# Patient Record
Sex: Female | Born: 1960 | Race: Black or African American | Hispanic: No | Marital: Single | Smoking: Former smoker
Health system: Southern US, Community
[De-identification: ages and names within clinical notes are randomized; demographics above are authoritative.]

## PROBLEM LIST (undated history)

## (undated) DIAGNOSIS — D126 Benign neoplasm of colon, unspecified: Secondary | ICD-10-CM

## (undated) DIAGNOSIS — I10 Essential (primary) hypertension: Secondary | ICD-10-CM

## (undated) DIAGNOSIS — J449 Chronic obstructive pulmonary disease, unspecified: Secondary | ICD-10-CM

## (undated) DIAGNOSIS — E785 Hyperlipidemia, unspecified: Secondary | ICD-10-CM

## (undated) DIAGNOSIS — K922 Gastrointestinal hemorrhage, unspecified: Secondary | ICD-10-CM

## (undated) HISTORY — DX: Benign neoplasm of colon, unspecified: D12.6

## (undated) HISTORY — DX: Hyperlipidemia, unspecified: E78.5

---

## 1998-04-18 ENCOUNTER — Emergency Department (HOSPITAL_COMMUNITY): Admission: EM | Admit: 1998-04-18 | Discharge: 1998-04-19 | Payer: Self-pay | Admitting: Internal Medicine

## 1998-12-18 ENCOUNTER — Encounter: Payer: Self-pay | Admitting: Emergency Medicine

## 1998-12-18 ENCOUNTER — Emergency Department (HOSPITAL_COMMUNITY): Admission: EM | Admit: 1998-12-18 | Discharge: 1998-12-18 | Payer: Self-pay | Admitting: Emergency Medicine

## 1999-08-19 ENCOUNTER — Emergency Department (HOSPITAL_COMMUNITY): Admission: EM | Admit: 1999-08-19 | Discharge: 1999-08-19 | Payer: Self-pay | Admitting: Emergency Medicine

## 2000-01-02 ENCOUNTER — Encounter: Payer: Self-pay | Admitting: Internal Medicine

## 2000-01-02 ENCOUNTER — Ambulatory Visit (HOSPITAL_COMMUNITY): Admission: RE | Admit: 2000-01-02 | Discharge: 2000-01-02 | Payer: Self-pay | Admitting: Internal Medicine

## 2000-12-07 ENCOUNTER — Emergency Department (HOSPITAL_COMMUNITY): Admission: EM | Admit: 2000-12-07 | Discharge: 2000-12-07 | Payer: Self-pay | Admitting: *Deleted

## 2000-12-07 ENCOUNTER — Encounter: Payer: Self-pay | Admitting: Emergency Medicine

## 2001-01-22 ENCOUNTER — Ambulatory Visit (HOSPITAL_COMMUNITY): Admission: RE | Admit: 2001-01-22 | Discharge: 2001-01-22 | Payer: Self-pay | Admitting: Family Medicine

## 2001-07-17 ENCOUNTER — Encounter: Payer: Self-pay | Admitting: Emergency Medicine

## 2001-07-17 ENCOUNTER — Emergency Department (HOSPITAL_COMMUNITY): Admission: EM | Admit: 2001-07-17 | Discharge: 2001-07-17 | Payer: Self-pay | Admitting: Emergency Medicine

## 2002-06-12 ENCOUNTER — Inpatient Hospital Stay (HOSPITAL_COMMUNITY): Admission: EM | Admit: 2002-06-12 | Discharge: 2002-06-15 | Payer: Self-pay | Admitting: Psychiatry

## 2002-08-07 ENCOUNTER — Other Ambulatory Visit: Admission: RE | Admit: 2002-08-07 | Discharge: 2002-08-07 | Payer: Self-pay | Admitting: Family Medicine

## 2002-08-21 ENCOUNTER — Emergency Department (HOSPITAL_COMMUNITY): Admission: EM | Admit: 2002-08-21 | Discharge: 2002-08-21 | Payer: Self-pay | Admitting: Emergency Medicine

## 2002-08-23 ENCOUNTER — Emergency Department (HOSPITAL_COMMUNITY): Admission: EM | Admit: 2002-08-23 | Discharge: 2002-08-23 | Payer: Self-pay | Admitting: Emergency Medicine

## 2003-05-20 ENCOUNTER — Encounter: Payer: Self-pay | Admitting: *Deleted

## 2003-05-20 ENCOUNTER — Emergency Department (HOSPITAL_COMMUNITY): Admission: EM | Admit: 2003-05-20 | Discharge: 2003-05-20 | Payer: Self-pay | Admitting: *Deleted

## 2006-09-12 ENCOUNTER — Emergency Department (HOSPITAL_COMMUNITY): Admission: EM | Admit: 2006-09-12 | Discharge: 2006-09-12 | Payer: Self-pay | Admitting: Emergency Medicine

## 2006-11-19 ENCOUNTER — Emergency Department (HOSPITAL_COMMUNITY): Admission: EM | Admit: 2006-11-19 | Discharge: 2006-11-19 | Payer: Self-pay | Admitting: Emergency Medicine

## 2006-12-10 ENCOUNTER — Ambulatory Visit: Payer: Self-pay | Admitting: Family Medicine

## 2006-12-12 ENCOUNTER — Ambulatory Visit: Payer: Self-pay | Admitting: *Deleted

## 2007-01-10 ENCOUNTER — Ambulatory Visit: Payer: Self-pay | Admitting: Family Medicine

## 2007-01-17 ENCOUNTER — Ambulatory Visit (HOSPITAL_COMMUNITY): Admission: RE | Admit: 2007-01-17 | Discharge: 2007-01-17 | Payer: Self-pay | Admitting: Family Medicine

## 2007-02-11 ENCOUNTER — Ambulatory Visit: Payer: Self-pay | Admitting: Family Medicine

## 2007-03-11 ENCOUNTER — Encounter (INDEPENDENT_AMBULATORY_CARE_PROVIDER_SITE_OTHER): Payer: Self-pay | Admitting: Family Medicine

## 2007-03-11 ENCOUNTER — Ambulatory Visit: Payer: Self-pay | Admitting: Family Medicine

## 2007-04-15 ENCOUNTER — Ambulatory Visit: Payer: Self-pay | Admitting: Family Medicine

## 2007-07-16 ENCOUNTER — Encounter (INDEPENDENT_AMBULATORY_CARE_PROVIDER_SITE_OTHER): Payer: Self-pay | Admitting: *Deleted

## 2007-11-02 ENCOUNTER — Emergency Department (HOSPITAL_COMMUNITY): Admission: EM | Admit: 2007-11-02 | Discharge: 2007-11-02 | Payer: Self-pay | Admitting: Emergency Medicine

## 2008-02-27 ENCOUNTER — Ambulatory Visit: Payer: Self-pay | Admitting: Family Medicine

## 2008-03-08 ENCOUNTER — Emergency Department (HOSPITAL_COMMUNITY): Admission: EM | Admit: 2008-03-08 | Discharge: 2008-03-08 | Payer: Self-pay | Admitting: Emergency Medicine

## 2008-03-09 ENCOUNTER — Ambulatory Visit (HOSPITAL_COMMUNITY): Admission: RE | Admit: 2008-03-09 | Discharge: 2008-03-09 | Payer: Self-pay | Admitting: Family Medicine

## 2008-07-28 ENCOUNTER — Ambulatory Visit: Payer: Self-pay | Admitting: Internal Medicine

## 2008-12-12 ENCOUNTER — Emergency Department (HOSPITAL_COMMUNITY): Admission: EM | Admit: 2008-12-12 | Discharge: 2008-12-12 | Payer: Self-pay | Admitting: Emergency Medicine

## 2008-12-17 ENCOUNTER — Ambulatory Visit: Payer: Self-pay | Admitting: Family Medicine

## 2008-12-23 ENCOUNTER — Ambulatory Visit: Payer: Self-pay | Admitting: Family Medicine

## 2009-02-23 ENCOUNTER — Ambulatory Visit: Payer: Self-pay | Admitting: Family Medicine

## 2009-02-24 ENCOUNTER — Ambulatory Visit (HOSPITAL_COMMUNITY): Admission: RE | Admit: 2009-02-24 | Discharge: 2009-02-24 | Payer: Self-pay | Admitting: Family Medicine

## 2009-03-08 ENCOUNTER — Ambulatory Visit (HOSPITAL_COMMUNITY): Admission: RE | Admit: 2009-03-08 | Discharge: 2009-03-08 | Payer: Self-pay | Admitting: Family Medicine

## 2009-03-25 ENCOUNTER — Ambulatory Visit: Payer: Self-pay | Admitting: Family Medicine

## 2009-11-22 ENCOUNTER — Ambulatory Visit: Payer: Self-pay | Admitting: Family Medicine

## 2009-12-09 ENCOUNTER — Telehealth (INDEPENDENT_AMBULATORY_CARE_PROVIDER_SITE_OTHER): Payer: Self-pay | Admitting: *Deleted

## 2009-12-09 ENCOUNTER — Ambulatory Visit (HOSPITAL_COMMUNITY): Admission: RE | Admit: 2009-12-09 | Discharge: 2009-12-09 | Payer: Self-pay | Admitting: Family Medicine

## 2010-03-10 ENCOUNTER — Ambulatory Visit (HOSPITAL_COMMUNITY): Admission: RE | Admit: 2010-03-10 | Discharge: 2010-03-10 | Payer: Self-pay | Admitting: Internal Medicine

## 2010-09-10 ENCOUNTER — Emergency Department (HOSPITAL_COMMUNITY)
Admission: EM | Admit: 2010-09-10 | Discharge: 2010-09-10 | Payer: Self-pay | Source: Home / Self Care | Admitting: Emergency Medicine

## 2010-09-12 ENCOUNTER — Emergency Department (HOSPITAL_COMMUNITY): Admission: EM | Admit: 2010-09-12 | Discharge: 2010-09-12 | Payer: Self-pay | Admitting: Emergency Medicine

## 2010-10-05 ENCOUNTER — Emergency Department (HOSPITAL_COMMUNITY): Admission: EM | Admit: 2010-10-05 | Discharge: 2009-11-12 | Payer: Self-pay | Admitting: Emergency Medicine

## 2010-11-28 NOTE — Progress Notes (Signed)
Summary: triage/injury finger  Phone Note Call from Patient   Caller: Patient Reason for Call: Talk to Nurse Summary of Call: Patient came into front desk wanting to be seen.Marland KitchenMarland KitchenShe pulled skin off her finger and a big chuck of skin about the size of a dime came off and several layers beneath. No bleeding or bone seen.Marland KitchenMarland KitchenShe has area coverd with bandaid and states it is throbbing.Marland KitchenMarland KitchenPatient waiting for wallk in eligilibity. I will put her in acute schedule pending approval..Marland KitchenOtherwise she will be directed to UC or ED. Initial call taken by: Conchita Paris,  December 09, 2009 11:43 AM

## 2011-02-13 LAB — RPR: RPR Ser Ql: NONREACTIVE

## 2011-02-13 LAB — ROCKY MTN SPOTTED FVR AB, IGM-BLOOD: RMSF IgM: 0.46 IV (ref 0.00–0.89)

## 2011-02-14 ENCOUNTER — Other Ambulatory Visit (HOSPITAL_COMMUNITY): Payer: Self-pay | Admitting: Family Medicine

## 2011-02-14 DIAGNOSIS — Z1231 Encounter for screening mammogram for malignant neoplasm of breast: Secondary | ICD-10-CM

## 2011-03-16 ENCOUNTER — Ambulatory Visit (HOSPITAL_COMMUNITY)
Admission: RE | Admit: 2011-03-16 | Discharge: 2011-03-16 | Disposition: A | Discharge status: Home / Self Care | Payer: Self-pay | Source: Ambulatory Visit | Attending: Family Medicine | Admitting: Family Medicine

## 2011-03-16 DIAGNOSIS — Z1231 Encounter for screening mammogram for malignant neoplasm of breast: Secondary | ICD-10-CM | POA: Insufficient documentation

## 2011-03-16 NOTE — H&P (Signed)
NAME:  Mariah Mariah Foster, Mariah Mariah Foster                         ACCOUNT NO.:  [id number]   MEDICAL RECORD NO.:  [id number]                   PATIENT TYPE:  IPS   LOCATION:  0504                                 [facility]ILITY[facility]   PHYSICIAN:  Geoffery Lyons, MD                     DATE OF BIRTH:  1961-09-19   DATE OF ADMISSION:  06/12/2002  DATE OF DISCHARGE:  06/15/2002                         PSYCHIATRIC ADMISSION ASSESSMENT   IDENTIFYING INFORMATION:  The patient is Mariah Foster 50 year old separated African  American female voluntarily admitted on June 12, 2002.   HISTORY OF PRESENT ILLNESS:  The patient presents with Mariah Foster history of  intentional overdose of five Celebrex tablets and an extra Verapamil tablet  on Friday at home after drinking two 40 ounce beers at home.  The patient  states she got upset at her husband who was having an affair with another  woman in the neighborhood.  Her neighbor knew that the patient had taken the  pills.  Her intention was to see if her husband would come back to her.  Her  sleep has been decreased, her appetite has been decreased.  She denies any  psychotic symptoms.  She reports that she states it was Mariah Foster stupid thing to  do.  She reports no withdrawal symptoms.   PAST PSYCHIATRIC HISTORY:  First hospitalization to [facility], no history of detoxification, no history of Mariah Foster suicide attempt.   SOCIAL HISTORY:  This is Mariah Foster 50 year old separated African American female.  First marriage, married for 27 years.  She has two children, ages 51 and 76.  She lives with her children.  She is unemployed.  Completed the 12th grade.  No legal charges.   FAMILY HISTORY:  None.   SUBSTANCE ABUSE HISTORY:  The patient smokes.  She has been drinking two 40  ounce beers for the past 20 years, but does not drink in the morning.  She  states she usually drinks on the weekend with no history of blackouts or  seizures.  She usually drinks alone.  She also smoked cocaine, states only  once.   PAST MEDICAL HISTORY:  Primary care Vivan Agostino: The patient attends [facility].  Medical problems: Hypertension.   MEDICATIONS:  Verapamil 180 mg every day.   DRUG ALLERGIES:  No known allergies.   PHYSICAL EXAMINATION:  Performed at [facility] Emergency Department.   LABORATORY DATA:  CBC: Within normal limits.  Urinalysis was negative.  CMET: Potassium 3.1.  Alcohol level was 313.  Urine drug screen was positive  for cocaine.   MENTAL STATUS EXAM:  She is an alert, disheveled African American female.  She is thin, she is cooperative with good eye contact.  Speech is clear and  polite.  Mood is depressed.  Affect showed some depression with periods of  being very bright.  Thought processes: The patient appears to be minimizing  her  drinking.  She is coherent, however, with no evidence of psychosis, no  auditory or visual hallucinations, no suicidal or homicidal ideation.  Cognitive: Intact.  Memory is fair.  Judgment and insight are fair.  Poor  impulse control.   ADMISSION DIAGNOSES:   AXIS I:  1. Depression, not otherwise specified.  2. Suicide gesture.  3. Alcohol abuse, rule out dependence.   AXIS II:  Deferred.   AXIS III:  Hypertension.   AXIS IV:  Problems with primary support group and other psychosocial  problems.   AXIS V:  Current is 30, this past year is 60.   INITIAL PLAN OF CARE:  Plan is Mariah Foster voluntary admission for depression,  intentional overdose, and alcohol abuse.  Contract for safety, check every  15 minutes.  Will put the patient on Mariah Foster low dose Librium protocol to detoxify  safely.  Will initiate Lexapro to decrease depressive symptoms, to stabilize  mood and thinking so the patient can be safe.  Medication compliance was  discussed.  Will encourage fluids.  The patient is to attend AA and mental  health after discharge and to remain alcohol and drug free.   ESTIMATED LENGTH OF STAY:  Three to four days.     Landry Corporal, N.P.                        Geoffery Lyons, MD    JO/MEDQ  D:  07/13/2002  T:  07/13/2002  Job:  [id number]

## 2011-03-16 NOTE — Discharge Summary (Signed)
NAME:  Mariah Foster, Mariah Foster                         ACCOUNT NO.:  [id number]   MEDICAL RECORD NO.:  [id number]                  PATIENT TYPE:  IPS   LOCATION:  0504                                 [facility]   PHYSICIAN:  Geoffery Lyons, MD                     DATE OF BIRTH:  03-Jun-1961   DATE OF ADMISSION:  06/12/2002  DATE OF DISCHARGE:  06/15/2002                                 DISCHARGE SUMMARY   CHIEF COMPLAINT AND PRESENT ILLNESS:  This was the first admission to [facility] for this 50 year old female voluntarily  admitted after an intentional overdose of five Celebrex and extra verapamil,  drinking two 40 ounce beers at home.  She got upset over her husband having  an affair with another woman in the neighborhood.  Her neighbor knew that  she had taken the pills.  The patient wanted to see if the husband would  come back to her.  She has had increased depression, decreased sleep,  decreased appetite, but there was no evidence of psychosis.  She had  expressed upon admission regrets of what she did.   PAST PSYCHIATRIC HISTORY:  First time inpatient, no previous detoxification,  no previous suicidal attempt.   ALCOHOL HISTORY:  Two 40 ounce beers on the weekend for 20 years, no weekday  consumption, no blackouts, no seizures, no withdrawal, no drugs.   PAST MEDICAL HISTORY:  Arterial hypertension.   MEDICATIONS:  Verapamil 180 mg per day.   PHYSICAL EXAMINATION:  Physical examination was performed, failed to show  any acute findings.   MENTAL STATUS EXAM:  Mental status exam revealed an alert, disheveled,  African-American female, thin, cooperative,  good eye contact.  Speech:  Clear, goal directed.  Mood: Depressed.  Affect: Depressed.  Thought  processes: Coherent, logical; no evidence of psychosis, no auditory and  visual hallucinations, no suicidal or homicidal ideas.  Cognitive: Cognition  was well preserved.   ADMISSION DIAGNOSES:   AXIS  I:  1. Depressive disorder, not otherwise specified.  2. Alcohol abuse.   AXIS II:  No diagnosis.   AXIS III:  Arterial hypertension.   AXIS IV:  Moderate.   AXIS V:  Global assessment of functioning upon admission 30, highest global  assessment of functioning in the last year 60.   LABORATORY DATA:  Other laboratory workup: Thyroid profile was within normal  limits.   HOSPITAL COURSE:  She was admitted and started in intensive individual and  group psychotherapy.  She was detoxified using Librium.  She was placed back  on her Verapamil 180 mg daily.  Due to her depression, she was placed on  Lexapro 5 mg every day and trazodone for sleep.  She was able to open up in  group and individually, did admit to multiple substance use, cocaine,  alcohol.  There were environmental issues, dealing with the affair  of the  husband, possible acting out to get the husband back.  She did evidence  being somewhat reserved, somewhat guarded, some psychomotor retardation but  there was no evidence of psychosis and she persistently denied any suicidal  ideation.  We continued to work with her.  On August 18, she was much  improved, mood euthymic, affect brighter and broad, no suicidal or homicidal  ideas.  She was pretty much ready to move on with her life, did admit that  this was an attempt to get the husband back and it did not work so she was  ready to go and let him go.  She denied any suicidal ideas, no homicidal  ideas.  She was willing to go back to [facility] for further treatment.   DISCHARGE DIAGNOSES:   AXIS I:  1. Depressive disorder, not otherwise specified.  2. Polysubstance abuse.   AXIS II:  No diagnosis.   AXIS III:  Arterial hypertension.   AXIS IV:  Moderate.   AXIS V:  Global assessment of functioning upon discharge 55.   DISCHARGE MEDICATIONS:  1. Verapamil 180 mg daily.  2. Lexapro 10 mg daily.  3. Trazodone as needed for sleep.   FOLLOW UP:  The patient  was to follow-up [facility].                                                 Geoffery Lyons, MD    IL/MEDQ  D:  07/13/2002  T:  07/13/2002  Job:  [id number]

## 2011-04-25 ENCOUNTER — Emergency Department (HOSPITAL_COMMUNITY): Payer: Self-pay

## 2011-04-25 ENCOUNTER — Emergency Department (HOSPITAL_COMMUNITY)
Admission: EM | Admit: 2011-04-25 | Discharge: 2011-04-25 | Disposition: A | Discharge status: Home / Self Care | Payer: Self-pay | Attending: Emergency Medicine | Admitting: Emergency Medicine

## 2011-04-25 DIAGNOSIS — R05 Cough: Secondary | ICD-10-CM | POA: Insufficient documentation

## 2011-04-25 DIAGNOSIS — J40 Bronchitis, not specified as acute or chronic: Secondary | ICD-10-CM | POA: Insufficient documentation

## 2011-04-25 DIAGNOSIS — I10 Essential (primary) hypertension: Secondary | ICD-10-CM | POA: Insufficient documentation

## 2011-04-25 DIAGNOSIS — R070 Pain in throat: Secondary | ICD-10-CM | POA: Insufficient documentation

## 2011-04-25 DIAGNOSIS — Z79899 Other long term (current) drug therapy: Secondary | ICD-10-CM | POA: Insufficient documentation

## 2011-04-25 DIAGNOSIS — R059 Cough, unspecified: Secondary | ICD-10-CM | POA: Insufficient documentation

## 2011-04-25 DIAGNOSIS — E785 Hyperlipidemia, unspecified: Secondary | ICD-10-CM | POA: Insufficient documentation

## 2011-04-25 DIAGNOSIS — R1310 Dysphagia, unspecified: Secondary | ICD-10-CM | POA: Insufficient documentation

## 2011-04-25 DIAGNOSIS — R509 Fever, unspecified: Secondary | ICD-10-CM | POA: Insufficient documentation

## 2011-04-25 DIAGNOSIS — F329 Major depressive disorder, single episode, unspecified: Secondary | ICD-10-CM | POA: Insufficient documentation

## 2011-04-25 DIAGNOSIS — F3289 Other specified depressive episodes: Secondary | ICD-10-CM | POA: Insufficient documentation

## 2011-04-25 DIAGNOSIS — R079 Chest pain, unspecified: Secondary | ICD-10-CM | POA: Insufficient documentation

## 2011-04-25 DIAGNOSIS — R110 Nausea: Secondary | ICD-10-CM | POA: Insufficient documentation

## 2011-04-25 LAB — URINALYSIS, ROUTINE W REFLEX MICROSCOPIC
Nitrite: NEGATIVE
Specific Gravity, Urine: 1.012 (ref 1.005–1.030)
Urobilinogen, UA: 0.2 mg/dL (ref 0.0–1.0)

## 2011-04-25 LAB — URINE MICROSCOPIC-ADD ON

## 2011-09-28 ENCOUNTER — Encounter: Payer: Self-pay | Admitting: Emergency Medicine

## 2011-09-28 ENCOUNTER — Emergency Department (INDEPENDENT_AMBULATORY_CARE_PROVIDER_SITE_OTHER)
Admission: EM | Admit: 2011-09-28 | Discharge: 2011-09-28 | Disposition: A | Discharge status: Home / Self Care | Payer: Self-pay | Source: Home / Self Care | Attending: Emergency Medicine | Admitting: Emergency Medicine

## 2011-09-28 DIAGNOSIS — S61309A Unspecified open wound of unspecified finger with damage to nail, initial encounter: Secondary | ICD-10-CM

## 2011-09-28 DIAGNOSIS — S61209A Unspecified open wound of unspecified finger without damage to nail, initial encounter: Secondary | ICD-10-CM

## 2011-09-28 HISTORY — DX: Essential (primary) hypertension: I10

## 2011-09-28 NOTE — ED Notes (Signed)
Removal of hangnail. Pt states it has been injured for about a month, but is spreading and becoming more painful when catching on things. No discharge, or draining.

## 2011-09-28 NOTE — ED Provider Notes (Signed)
History     CSN: [id number] Arrival date & time: 09/28/2011  8:35 PM   First MD Initiated Contact with Patient 09/28/11 1921      Chief Complaint  Patient presents with  . Nail Problem    (Consider location/radiation/quality/duration/timing/severity/associated sxs/prior treatment) HPI Comments: He has a partially avulsed fingernail of the right middle finger. This apparently happened weeks ago, but she can't recall any trauma. The new nail is growing in underneath-year-old female in the old nail is somewhat loose and painful to touch. There is no evidence of infection.   Past Medical History  Diagnosis Date  . Hypertension     History reviewed. No pertinent past surgical history.  History reviewed. No pertinent family history.  History  Substance Use Topics  . Smoking status: Former Smoker    Quit date: 06/28/2011  . Smokeless tobacco: Not on file  . Alcohol Use: No    OB History    Grav Para Term Preterm Abortions TAB SAB Ect Mult Living                  Review of Systems  Musculoskeletal: Positive for arthralgias. Negative for myalgias, back pain, joint swelling and gait problem.  Skin: Negative for rash and wound.  Neurological: Negative for weakness and numbness.    Allergies  Review of patient's allergies indicates no known allergies.  Home Medications  No current outpatient prescriptions on file.  BP 132/85  Pulse 69  Temp(Src) 97.9 F (36.6 C) (Oral)  SpO2 98%  Physical Exam  Nursing note and vitals reviewed. Constitutional: She is oriented to person, place, and time. She appears well-developed and well-nourished. No distress.  Musculoskeletal: Normal range of motion. She exhibits no edema and no tenderness.       Exam the right middle finger reveals a partially avulsed nail that was partially attached to the nailbed. The new nail is growing in underneath this and appears to be healthy. There is no evidence of infection.  Neurological: She is alert  and oriented to person, place, and time. She has normal strength and normal reflexes. She displays no atrophy. No sensory deficit. She exhibits normal muscle tone.  Skin: Skin is warm and dry. No rash noted. She is not diaphoretic.    ED Course  NAIL REMOVAL Date/Time: 09/28/2011 11:04 PM Performed by: Roque Lias Authorized by: Roque Lias Consent: Verbal consent obtained. Written consent not obtained. Risks and benefits: risks, benefits and alternatives were discussed Consent given by: patient Patient understanding: patient states understanding of the procedure being performed Location: right hand Location details: right long finger Anesthesia: digital block Local anesthetic: lidocaine 2% without epinephrine Anesthetic total: 5 ml Patient sedated: no Preparation: skin prepped with alcohol Amount removed: complete Wedge excision of skin of nail fold: no Nail bed sutured: no Nail matrix removed: none Dressing: antibiotic ointment and gauze roll Patient tolerance: Patient tolerated the procedure well with no immediate complications.   (including critical care time)  Labs Reviewed - No data to display No results found.   1. Nail avulsion, finger       MDM  The patient had a partially evulsed nail. After adequate anesthesia the remainder the nail was removed and a dressing was applied. She was instructed in wound care.        Roque Lias, MD 09/28/11 [phone]

## 2011-10-10 ENCOUNTER — Other Ambulatory Visit (HOSPITAL_COMMUNITY): Payer: Self-pay | Admitting: Family Medicine

## 2011-10-10 DIAGNOSIS — E0590 Thyrotoxicosis, unspecified without thyrotoxic crisis or storm: Secondary | ICD-10-CM

## 2011-10-18 ENCOUNTER — Encounter (HOSPITAL_COMMUNITY)
Admission: RE | Admit: 2011-10-18 | Discharge: 2011-10-18 | Disposition: A | Discharge status: Home / Self Care | Payer: Self-pay | Source: Ambulatory Visit | Attending: Family Medicine | Admitting: Family Medicine

## 2011-10-18 DIAGNOSIS — E039 Hypothyroidism, unspecified: Secondary | ICD-10-CM | POA: Insufficient documentation

## 2011-10-18 DIAGNOSIS — E0590 Thyrotoxicosis, unspecified without thyrotoxic crisis or storm: Secondary | ICD-10-CM

## 2011-10-18 MED ORDER — SODIUM IODIDE I 131 CAPSULE
6.8000 | Freq: Once | INTRAVENOUS | Status: AC | PRN
  Administered 2011-10-18: 6.8 via ORAL

## 2011-10-19 ENCOUNTER — Encounter (HOSPITAL_COMMUNITY)
Admission: RE | Admit: 2011-10-19 | Discharge: 2011-10-19 | Disposition: A | Discharge status: Home / Self Care | Payer: Self-pay | Source: Ambulatory Visit | Attending: Family Medicine | Admitting: Family Medicine

## 2011-10-19 MED ORDER — SODIUM IODIDE I 131 CAPSULE
6.8000 | Freq: Once | INTRAVENOUS | Status: AC | PRN
  Administered 2011-10-18: 6.8 via ORAL

## 2011-10-19 MED ORDER — SODIUM PERTECHNETATE TC 99M INJECTION
10.5000 | Freq: Once | INTRAVENOUS | Status: AC | PRN
  Administered 2011-10-19: 11 via INTRAVENOUS

## 2011-10-30 DIAGNOSIS — D126 Benign neoplasm of colon, unspecified: Secondary | ICD-10-CM

## 2011-10-30 HISTORY — DX: Benign neoplasm of colon, unspecified: D12.6

## 2011-11-22 ENCOUNTER — Other Ambulatory Visit (HOSPITAL_COMMUNITY): Payer: Self-pay | Admitting: Family Medicine

## 2011-11-22 ENCOUNTER — Other Ambulatory Visit: Payer: Self-pay | Admitting: Family Medicine

## 2011-11-22 DIAGNOSIS — Z1231 Encounter for screening mammogram for malignant neoplasm of breast: Secondary | ICD-10-CM

## 2012-01-03 ENCOUNTER — Telehealth: Payer: Self-pay | Admitting: *Deleted

## 2012-01-03 NOTE — Telephone Encounter (Signed)
Pt. Forgot her appt.  Thought it was on a Friday.  I [facility] her to [facility]  01/04/12  Previsit room 51

## 2012-01-04 ENCOUNTER — Telehealth: Payer: Self-pay | Admitting: Gastroenterology

## 2012-01-04 ENCOUNTER — Ambulatory Visit (AMBULATORY_SURGERY_CENTER): Payer: Self-pay | Admitting: *Deleted

## 2012-01-04 VITALS — Ht 64.0 in | Wt 120.0 lb

## 2012-01-04 DIAGNOSIS — Z1211 Encounter for screening for malignant neoplasm of colon: Secondary | ICD-10-CM

## 2012-01-04 MED ORDER — PEG-KCL-NACL-NASULF-NA ASC-C 100 G PO SOLR: ORAL | Status: DC

## 2012-01-04 NOTE — Telephone Encounter (Signed)
I spoke with Aundra Millet re: Ms. Leger confusion about what procedure she is scheduled for.  Ms. Maple Hudson explained that Ms. Davlin has problems comprehending instructions and thought she was having Thyroid Surgery.  Ms. Maple Hudson said she would explain to pt what she is having.   Note:  Ms. Rahn couldn't come to the phone at this time.

## 2012-01-17 ENCOUNTER — Encounter: Payer: Self-pay | Admitting: Gastroenterology

## 2012-01-17 ENCOUNTER — Other Ambulatory Visit: Payer: Self-pay | Admitting: Gastroenterology

## 2012-01-17 ENCOUNTER — Ambulatory Visit (AMBULATORY_SURGERY_CENTER): Payer: Self-pay | Admitting: Gastroenterology

## 2012-01-17 VITALS — BP 139/90 | HR 70 | Temp 99.2°F | Resp 18 | Ht 64.0 in | Wt 120.0 lb

## 2012-01-17 DIAGNOSIS — D126 Benign neoplasm of colon, unspecified: Secondary | ICD-10-CM

## 2012-01-17 DIAGNOSIS — Z1211 Encounter for screening for malignant neoplasm of colon: Secondary | ICD-10-CM

## 2012-01-17 LAB — GLUCOSE, CAPILLARY: Glucose-Capillary: 117 mg/dL — ABNORMAL HIGH (ref 70–99)

## 2012-01-17 MED ORDER — SODIUM CHLORIDE 0.9 % IV SOLN: 500.0000 mL | INTRAVENOUS | Status: DC

## 2012-01-17 NOTE — Progress Notes (Signed)
Pressure applied to abdomen to reach cecum.  

## 2012-01-17 NOTE — Op Note (Addendum)
[facility] [address] [place], [place]  [address]  COLONOSCOPY PROCEDURE REPORT PATIENT:  Mariah Foster, Mariah Foster  MR#:  [id number] BIRTHDATE:  Jul 07, 1961, 50 yrs. old  GENDER:  female ENDOSCOPIST:  Judie Petit T. Russella Dar, MD, [facility] Referred by:  Jaclyn Shaggy, M.D. PROCEDURE DATE:  01/17/2012 PROCEDURE:  Colonoscopy with biopsy and snare polypectomy ASA CLASS:  Class II INDICATIONS:  1) Routine Risk Screening MEDICATIONS:   Fentanyl 100 mcg IV, Versed 9 mg IV DESCRIPTION OF PROCEDURE:   After the risks benefits and alternatives of the procedure were thoroughly explained, informed consent was obtained.  Digital rectal exam was performed and revealed no abnormalities.   The LB [id number]-[id number] endoscope was introduced through the anus and advanced to the cecum, which was identified by both the appendix and ileocecal valve, without limitations.  The quality of the prep was good, using MoviPrep. The instrument was then slowly withdrawn as the colon was fully examined. <<PROCEDUREIMAGES>> FINDINGS:  A sessile polyp was found at the hepatic flexure. It was 4 mm in size. The polyp was removed using cold biopsy forceps. A sessile polyp was found in the mid transverse colon. It was 6 mm in size. Polyp was snared without cautery. Retrieval was successful.  A sessile polyp was found in the sigmoid colon. It was 3 mm in size. The polyp was removed using cold biopsy forceps. Three polyps were found in the rectum. They were 4 - 5 mm in size. Polyps were snared without cautery. Retrieval was successful. Otherwise normal colonoscopy without other polyps, masses, vascular ectasias, or inflammatory changes. Retroflexed views in the rectum revealed internal hemorrhoids, small. The time to cecum =  2.67 minutes. The scope was then withdrawn (time =  13.25  min) from the patient and the procedure completed.  COMPLICATIONS:  None  ENDOSCOPIC IMPRESSION: 1) 4 mm sessile polyp at the hepatic  flexure 2) 6 mm sessile polyp in the mid transverse colon 3) 3 mm sessile polyp in the sigmoid colon 4) 4 - 5 mm Three polyps in the rectum 5) Internal hemorrhoids  RECOMMENDATIONS: 1) Await pathology results 2) If the polyps are adenomatous (pre-cancerous), repeat colonoscopy in 5 years. Otherwise follow colorectal cancer screening guidelines for "routine risk" patients with colonoscopy in 10 years.  Venita Lick. Russella Dar, MD, [facility]  n. REVISED:  01/17/2012 10:08 AM eSIGNED:   Venita Lick. Kyndall Chaplin at 01/17/2012 10:08 AM  [address], [place], [id number]

## 2012-01-17 NOTE — Patient Instructions (Signed)
Colonoscopy Care After Read the instructions outlined below and refer to this sheet in the next few weeks. These discharge instructions provide you with general information on caring for yourself after you leave the hospital. Your doctor may also give you specific instructions. While your treatment has been planned according to the most current medical practices available, unavoidable complications occasionally occur. If you have any problems or questions after discharge, call your doctor. HOME CARE INSTRUCTIONS ACTIVITY:  You may resume your regular activity, but move at a slower pace for the next 24 hours.   Take frequent rest periods for the next 24 hours.   Walking will help get rid of the air and reduce the bloated feeling in your belly (abdomen).   No driving for 24 hours (because of the medicine (anesthesia) used during the test).   You may shower.   Do not sign any important legal documents or operate any machinery for 24 hours (because of the anesthesia used during the test).  NUTRITION:  Drink plenty of fluids.   You may resume your normal diet as instructed by your doctor.   Begin with a light meal and progress to your normal diet. Heavy or fried foods are harder to digest and may make you feel sick to your stomach (nauseated).   Avoid alcoholic beverages for 24 hours or as instructed.  MEDICATIONS:  You may resume your normal medications unless your doctor tells you otherwise.  WHAT TO EXPECT TODAY:  Some feelings of bloating in the abdomen.   Passage of more gas than usual.   Spotting of blood in your stool or on the toilet paper.  IF YOU HAD POLYPS REMOVED DURING THE COLONOSCOPY:  No aspirin products for 7 days or as instructed.   No alcohol for 7 days or as instructed.   Eat a soft diet for the next 24 hours.  FINDING OUT THE RESULTS OF YOUR TEST Not all test results are available during your visit. If your test results are not back during the visit, make an  appointment with your caregiver to find out the results. Do not assume everything is normal if you have not heard from your caregiver or the medical facility. It is important for you to follow up on all of your test results.  SEEK IMMEDIATE MEDICAL CARE IF:  You have more than a spotting of blood in your stool.   Your belly is swollen (abdominal distention).   You are nauseated or vomiting.   You have a fever.   You have abdominal pain or discomfort that is severe or gets worse throughout the day.     Colon Polyps A polyp is extra tissue that grows inside your body. Colon polyps grow in the large intestine. The large intestine, also called the colon, is part of your digestive system. It is a long, hollow tube at the end of your digestive tract where your body makes and stores stool. Most polyps are not dangerous. They are benign. This means they are not cancerous. But over time, some types of polyps can turn into cancer. Polyps that are smaller than a pea are usually not harmful. But larger polyps could someday become or may already be cancerous. To be safe, doctors remove all polyps and test them.  WHO GETS POLYPS? Anyone can get polyps, but certain people are more likely than others. You may have a greater chance of getting polyps if:  You are over 50.   You have had polyps before.     Someone in your family has had polyps.   Someone in your family has had cancer of the large intestine.   Find out if someone in your family has had polyps. You may also be more likely to get polyps if you:   Eat a lot of fatty foods.   Smoke.   Drink alcohol.   Do not exercise.   Eat too much.  SYMPTOMS  Most small polyps do not cause symptoms. People often do not know they have one until their caregiver finds it during a regular checkup or while testing them for something else. Some people do have symptoms like these:  Bleeding from the anus. You might notice blood on your underwear or on  toilet paper after you have had a bowel movement.   Constipation or diarrhea that lasts more than a week.   Blood in the stool. Blood can make stool look black or it can show up as red streaks in the stool.  If you have any of these symptoms, see your caregiver. HOW DOES THE DOCTOR TEST FOR POLYPS? The doctor can use four tests to check for polyps:  Digital rectal exam. The caregiver wears gloves and checks your rectum (the last part of the large intestine) to see if it feels normal. This test would find polyps only in the rectum. Your caregiver may need to do one of the other tests listed below to find polyps higher up in the intestine.   Barium enema. The caregiver puts a liquid called barium into your rectum before taking x-rays of your large intestine. Barium makes your intestine look white in the pictures. Polyps are dark, so they are easy to see.   Sigmoidoscopy. With this test, the caregiver can see inside your large intestine. A thin flexible tube is placed into your rectum. The device is called a sigmoidoscope, which has a light and a tiny video camera in it. The caregiver uses the sigmoidoscope to look at the last third of your large intestine.   Colonoscopy. This test is like sigmoidoscopy, but the caregiver looks at all of the large intestine. It usually requires sedation. This is the most common method for finding and removing polyps.  TREATMENT   The caregiver will remove the polyp during sigmoidoscopy or colonoscopy. The polyp is then tested for cancer.   If you have had polyps, your caregiver may want you to get tested regularly in the future.  PREVENTION  There is not one sure way to prevent polyps. You might be able to lower your risk of getting them if you:  Eat more fruits and vegetables and less fatty food.   Do not smoke.   Avoid alcohol.   Exercise every day.   Lose weight if you are overweight.   Eating more calcium and folate can also lower your risk of  getting polyps. Some foods that are rich in calcium are milk, cheese, and broccoli. Some foods that are rich in folate are chickpeas, kidney beans, and spinach.   Aspirin might help prevent polyps. Studies are under way.  Document Released: 07/11/2004 Document Revised: 10/04/2011 Document Reviewed: 12/17/2007 [employer] Patient Information 2012 [employer]. 

## 2012-01-17 NOTE — Progress Notes (Signed)
Patient did not experience any of the following events: a burn prior to discharge; a fall within the facility; wrong site/side/patient/procedure/implant event; or a hospital transfer or hospital admission upon discharge from the facility.  Patient did not have preoperative order for IV antibiotic SSI prophylaxis 

## 2012-01-18 ENCOUNTER — Telehealth: Payer: Self-pay

## 2012-01-18 NOTE — Telephone Encounter (Signed)
  Follow up Call-  Call back number 01/17/2012  Post procedure Call Back phone  # [phone]  Permission to leave phone message Yes     Patient questions:  Do you have a fever, pain , or abdominal swelling? no Pain Score  0 *  Have you tolerated food without any problems? yes  Have you been able to return to your normal activities? yes  Do you have any questions about your discharge instructions: Diet   no Medications  no Follow up visit  no  Do you have questions or concerns about your Care? no  Actions: * If pain score is 4 or above: No action needed, pain <4.

## 2012-01-22 ENCOUNTER — Encounter: Payer: Self-pay | Admitting: Gastroenterology

## 2012-03-21 ENCOUNTER — Ambulatory Visit (HOSPITAL_COMMUNITY): Payer: PRIVATE HEALTH INSURANCE | Attending: Family Medicine

## 2012-05-09 ENCOUNTER — Ambulatory Visit (HOSPITAL_COMMUNITY)
Admission: RE | Admit: 2012-05-09 | Discharge: 2012-05-09 | Disposition: A | Discharge status: Home / Self Care | Payer: Self-pay | Source: Ambulatory Visit | Attending: Family Medicine | Admitting: Family Medicine

## 2012-05-09 DIAGNOSIS — Z1231 Encounter for screening mammogram for malignant neoplasm of breast: Secondary | ICD-10-CM | POA: Insufficient documentation

## 2012-08-11 ENCOUNTER — Encounter (HOSPITAL_COMMUNITY): Payer: Self-pay

## 2012-08-11 ENCOUNTER — Emergency Department (INDEPENDENT_AMBULATORY_CARE_PROVIDER_SITE_OTHER)
Admission: EM | Admit: 2012-08-11 | Discharge: 2012-08-11 | Disposition: A | Discharge status: Home / Self Care | Payer: Self-pay | Source: Home / Self Care

## 2012-08-11 DIAGNOSIS — E0590 Thyrotoxicosis, unspecified without thyrotoxic crisis or storm: Secondary | ICD-10-CM

## 2012-08-11 DIAGNOSIS — I10 Essential (primary) hypertension: Secondary | ICD-10-CM

## 2012-08-11 MED ORDER — METHIMAZOLE 5 MG PO TABS: 5.0000 mg | ORAL_TABLET | Freq: Three times a day (TID) | ORAL | Status: DC

## 2012-08-11 MED ORDER — ATENOLOL-CHLORTHALIDONE 50-25 MG PO TABS: 1.0000 | ORAL_TABLET | Freq: Every day | ORAL | Status: DC

## 2012-08-11 MED ORDER — POTASSIUM CHLORIDE ER 10 MEQ PO TBCR: 10.0000 meq | EXTENDED_RELEASE_TABLET | Freq: Two times a day (BID) | ORAL | Status: DC

## 2012-08-11 NOTE — ED Provider Notes (Signed)
.  History of Present Illness   Patient Identification Mariah Foster is a 51 y.o. female.  Patient information was obtained from patient. History/Exam limitations: poor historian. Patient presented to the Emergency Department:  Private car  Chief Complaint  Medication Management  Patient is a 51 y.o. African American female who presents to ED for refill of medications. She used to go to [facility] but now has no PCP. States she has high blood pressure, diabetes and muscle spasms. Her medications include Atenolol-chlorthalidone 50-25, KCl 10 meQ (q 12 hours)- taking once a day or less for "muscle spasms". Methimazole 5 mg TID for "diabetes." She denies any symptoms today. No chest pain, palpitations, headache, vision changes, shortness of breath, numbness/weakness, dysphagia, nausea/vomiting, diarrhea/constipation, unexplained weight change.   Past Medical History  Diagnosis Date  . Hypertension   . Hyperlipidemia   . Thyroid disease    History reviewed. No pertinent family history. No current facility-administered medications on file prior to encounter.   Current Outpatient Prescriptions on File Prior to Encounter  Medication Sig Dispense Refill  . atenolol-chlorthalidone (TENORETIC) 50-25 MG per tablet Take 1 tablet by mouth daily.      . hydrOXYzine (ATARAX/VISTARIL) 25 MG tablet Take 25 mg by mouth at bedtime.      . methimazole (TAPAZOLE) 5 MG tablet Take 5 mg by mouth 3 (three) times daily.      . potassium chloride (K-DUR) 10 MEQ tablet Take 1 tablet (10 mEq total) by mouth 2 (two) times daily.  60 tablet  0  . pravastatin (PRAVACHOL) 20 MG tablet Take 20 mg by mouth daily.       Note:  Not taking pravastatin or hydroxyzine. Allergies  Allergen Reactions  . Metronidazole Hcl Hives   History   Social History  . Marital Status: Married    Spouse Name: N/A    Number of Children: N/A  . Years of Education: N/A   Occupational History  . Not on file.   Social  History Main Topics  . Smoking status: Former Smoker    Quit date: 06/28/2011  . Smokeless tobacco: Never Used  . Alcohol Use: No  . Drug Use: No  . Sexually Active: No   Other Topics Concern  . Not on file   Social History Narrative  . No narrative on file  Restarted smoking recently - 2 to 3 cigarettes a day.  Review of Systems  See HPI   Physical Exam   BP 127/74  Pulse 62  Temp 97.7 F (36.5 C) (Oral)  Resp 18  SpO2 100%  Gen: WNWD, no acute distress HEENT:  NCAT, normal conjunctiva, EOMI Neck:  No JVD or bruits, no thyromegaly or masses CV:  RRR, no murmur Resp:  CTAB, normal effort Abd: + bowel sounds, non-tender Extrem:  No edema, 2+ dorsalis pedis  ED Course   Studies: None  Records Reviewed: previous medical records  Treatments: none  Consultations: none  Disposition: Home with one month supply of meds. Advised to establish primary care clinic.   Was seeing doctor at [facility]. Has nurse from [facility] who come weekly to check BP, sugar, etc. Saw one week ago. Not sure what her BP was. Glucose normal.  Denies fever/chills, No D/C, N/V, no trenmors, cp, sob, palpiations. Does have hot flashes.   Past Medical History  Diagnosis Date  . Hypertension   . Hyperlipidemia   . Thyroid disease    History reviewed. No pertinent past surgical history. History reviewed.  No pertinent family history. 2 sisters, 1 brother with DM and HTN. No heart disease. No cancer. History   Social History  . Marital Status: Married    Spouse Name: N/A    Number of Children: N/A  . Years of Education: N/A   Occupational History  . Not on file.   Social History Main Topics  . Smoking status: Former Smoker    Quit date: 06/28/2011  . Smokeless tobacco: Never Used  . Alcohol Use: No  . Drug Use: No  . Sexually Active: No   Other Topics Concern  . Not on file   Social History Narrative  . No narrative on file   Smokes 1-2 cig a day, no alcohol, no other  drugs  No current facility-administered medications on file prior to encounter.   Current Outpatient Prescriptions on File Prior to Encounter  Medication Sig Dispense Refill  . atenolol-chlorthalidone (TENORETIC) 50-25 MG per tablet Take 1 tablet by mouth daily.      . hydrOXYzine (ATARAX/VISTARIL) 25 MG tablet Take 25 mg by mouth at bedtime.      . methimazole (TAPAZOLE) 5 MG tablet Take 5 mg by mouth 3 (three) times daily.      . pravastatin (PRAVACHOL) 20 MG tablet Take 20 mg by mouth daily.       Stopped pravastatin. Allergies  Allergen Reactions  . Metronidazole Hcl Hives        Filed Vitals:   08/11/12 1946  BP: 127/74  Pulse: 62  Temp: 97.7 F (36.5 C)  Resp: 18     Napoleon Form, MD 08/11/12 2313

## 2012-08-11 NOTE — ED Notes (Signed)
Her MD's were at [facility], no more refills; c/o muscle spasms and scratchy throat

## 2012-09-05 ENCOUNTER — Other Ambulatory Visit (HOSPITAL_COMMUNITY): Payer: Self-pay | Admitting: Family Medicine

## 2012-09-05 NOTE — ED Notes (Signed)
Patient called, has not yet picked up her medications from [facility], and at 30 days, they will not allow her to have them. Patient states she has not had transportation to store to get the medications until now. Discussed with D Mabe, NP, who will not authorize meds to patient. Patient was advised it is her responsibility to get  Tho the store to get her medications, and we will have to discuss w the MD when she gets in later today, pt hung up on this Clinical research associate

## 2012-09-26 ENCOUNTER — Encounter (HOSPITAL_COMMUNITY): Payer: Self-pay

## 2012-09-26 ENCOUNTER — Emergency Department (HOSPITAL_COMMUNITY)
Admission: EM | Admit: 2012-09-26 | Discharge: 2012-09-26 | Disposition: A | Discharge status: Home / Self Care | Payer: Self-pay | Source: Home / Self Care

## 2012-09-26 DIAGNOSIS — E119 Type 2 diabetes mellitus without complications: Secondary | ICD-10-CM

## 2012-09-26 DIAGNOSIS — I10 Essential (primary) hypertension: Secondary | ICD-10-CM

## 2012-09-26 MED ORDER — METHIMAZOLE 5 MG PO TABS: 5.0000 mg | ORAL_TABLET | Freq: Three times a day (TID) | ORAL | Status: DC

## 2012-09-26 MED ORDER — ATENOLOL-CHLORTHALIDONE 50-25 MG PO TABS: 1.0000 | ORAL_TABLET | Freq: Every day | ORAL | Status: DC

## 2012-09-26 MED ORDER — POTASSIUM CHLORIDE ER 10 MEQ PO TBCR: 10.0000 meq | EXTENDED_RELEASE_TABLET | Freq: Two times a day (BID) | ORAL | Status: DC

## 2012-09-26 NOTE — ED Notes (Signed)
Patient states out of medication for almost 4 weeks.  Need refills Was seen on 09/22/2012 @ [facility]. At that time her BP was 143/97 P 70 r 18 pulse oximetry 100  cbg 124

## 2012-09-26 NOTE — ED Provider Notes (Signed)
History     CSN: [id number]  Arrival date & time 09/26/12  1225   None     Chief Complaint  Patient presents with  . Medication Refill  HPI The patient presents today to have refills of all of her medications.  She is taking medications for hypertension and for her thyroid condition.  She did not bring any medical records with her for this visit.  She reports that she was taking a medication for diabetes mellitus.  She does not know exactly what the medications were that she is taking.  The patient's blood pressure is elevated today she reports she has had some headaches.  She reports that she has a difficult time being able to afford her medications.  She has not filled some of her medications for several months.  In fact, her methimazole has not been filled for several months.  She has been going to a free clinic where she's been getting limited care.  She has not received any assistance with her medications unfortunately.  Past Medical History  Diagnosis Date  . Hypertension   . Hyperlipidemia   . Thyroid disease     History reviewed. No pertinent past surgical history.  No family history on file.  History  Substance Use Topics  . Smoking status: Former Smoker    Quit date: 06/28/2011  . Smokeless tobacco: Never Used  . Alcohol Use: No    OB History    Grav Para Term Preterm Abortions TAB SAB Ect Mult Living                  Review of Systems  Constitutional: Negative.   Eyes: Negative.   Respiratory: Negative.   Cardiovascular: Negative.   Gastrointestinal: Negative.   Musculoskeletal: Negative.   Neurological: Positive for headaches.  Hematological: Negative.   Psychiatric/Behavioral: Negative.     Allergies  Metronidazole hcl  Home Medications   Current Outpatient Rx  Name  Route  Sig  Dispense  Refill  . ATENOLOL-CHLORTHALIDONE 50-25 MG PO TABS   Oral   Take 1 tablet by mouth daily.         . ATENOLOL-CHLORTHALIDONE 50-25 MG PO TABS   Oral  Take 1 tablet by mouth daily.   30 tablet   0   . HYDROXYZINE HCL 25 MG PO TABS   Oral   Take 25 mg by mouth at bedtime.         Marland Kitchen METHIMAZOLE 5 MG PO TABS   Oral   Take 5 mg by mouth 3 (three) times daily.         Marland Kitchen POTASSIUM CHLORIDE ER 10 MEQ PO TBCR   Oral   Take 1 tablet (10 mEq total) by mouth 2 (two) times daily.   60 tablet   0   . PRAVASTATIN SODIUM 20 MG PO TABS   Oral   Take 20 mg by mouth daily.         Marland Kitchen METHIMAZOLE 5 MG PO TABS   Oral   Take 1 tablet (5 mg total) by mouth 3 (three) times daily.   90 tablet   0     BP 156/95  Pulse 61  Temp 98.6 F (37 C) (Oral)  Resp 20  SpO2 100%  Physical Exam  Constitutional: She is oriented to person, place, and time. She appears well-developed and well-nourished. No distress.  HENT:  Head: Normocephalic and atraumatic.  Eyes: EOM are normal. Pupils are equal, round, and reactive to light.  Neck: Normal range of motion. Neck supple. No JVD present.  Cardiovascular: Normal rate, regular rhythm and normal heart sounds.   Pulmonary/Chest: Effort normal and breath sounds normal.  Abdominal: Soft. Bowel sounds are normal.  Musculoskeletal: Normal range of motion. She exhibits no edema.  Neurological: She is alert and oriented to person, place, and time.  Skin: Skin is warm and dry.  Psychiatric: She has a normal mood and affect. Her behavior is normal. Judgment and thought content normal.    ED Course  Procedures (including critical care time)  Labs Reviewed - No data to display No results found.   No diagnosis found.  MDM  IMPRESSION  Hypertension  Hyperthyroidism  RECOMMENDATIONS / PLAN I did call her provider that she had been seen at the free clinic and spoke with her at length and she told me that she did not believe that the patient was taking any medication for her diabetes mellitus.  The patient was not sure but she thought one of her pills was a diabetic pale.  The provider told me that  they had tested her blood sugars multiple times and her blood sugars have been normal.  I also attempted to call the [facility] pharmacy to find out what medication she actually was taking.  I spoke with the pharmacist and he told me that she was taking Tenoretic 50/25 once daily potassium chloride 10 mEq twice daily and methimazole 5 mg 3 times a day but had not had the prescription filled and several months.  He said that the cost would be approximately $53 for the methimazole and that was the generic prescription.  I asked the patient about that and she said that she would be willing to start taking it. I asked the patient to please take her blood pressure medications and to walk-in to our clinic in the next week to have a blood pressure check with the nurse.  FOLLOW UP One month for recheck ( the patient says she might not be returning to this clinic patient because she had been set up with another free clinic to start in December.)  The patient was given clear instructions to go to ER or return to medical center if symptoms don't improve, worsen or new problems develop.  The patient verbalized understanding.  The patient was told to call to get lab results if they haven't heard anything in the next week.            Cleora Fleet, MD 09/26/12 1819

## 2012-10-17 ENCOUNTER — Encounter (HOSPITAL_COMMUNITY): Payer: Self-pay

## 2012-10-17 ENCOUNTER — Emergency Department (INDEPENDENT_AMBULATORY_CARE_PROVIDER_SITE_OTHER)
Admission: EM | Admit: 2012-10-17 | Discharge: 2012-10-17 | Disposition: A | Discharge status: Home / Self Care | Payer: Self-pay | Source: Home / Self Care

## 2012-10-17 DIAGNOSIS — B001 Herpesviral vesicular dermatitis: Secondary | ICD-10-CM

## 2012-10-17 DIAGNOSIS — E119 Type 2 diabetes mellitus without complications: Secondary | ICD-10-CM

## 2012-10-17 DIAGNOSIS — B009 Herpesviral infection, unspecified: Secondary | ICD-10-CM

## 2012-10-17 MED ORDER — ACYCLOVIR 400 MG PO TABS: 400.0000 mg | ORAL_TABLET | Freq: Three times a day (TID) | ORAL | Status: DC

## 2012-10-17 NOTE — ED Notes (Signed)
Patient states woke up this am with a blister to the right side of buttocks. No discharge she is aware of

## 2012-10-17 NOTE — ED Provider Notes (Signed)
History     CSN: [id number]  Arrival date & time 10/17/12  1200  Chief Complaint  Patient presents with  . Blister   HPI Pt presents today because she is concerned about blisters that have erupted on the buttocks area that have been painful.  He initially started off as itchy area that erupted into blisters.  She also has a cold sore on the lip as well.  The patient says that she's never experienced the blisters on the buttocks before.  But she wanted to have it evaluated.  She denies having fever chills nausea and vomiting.  No diarrhea reported.  Past Medical History  Diagnosis Date  . Hypertension   . Hyperlipidemia   . Thyroid disease     History reviewed. No pertinent past surgical history.  No family history on file.  History  Substance Use Topics  . Smoking status: Former Smoker    Quit date: 06/28/2011  . Smokeless tobacco: Never Used  . Alcohol Use: No    OB History    Grav Para Term Preterm Abortions TAB SAB Ect Mult Living                  Review of Systems  Constitutional: Negative.   HENT: Negative.   Cardiovascular: Negative.   Musculoskeletal: Negative.   Psychiatric/Behavioral: Negative.   All other systems reviewed and are negative.    Allergies  Metronidazole hcl  Home Medications   Current Outpatient Rx  Name  Route  Sig  Dispense  Refill  . ATENOLOL-CHLORTHALIDONE 50-25 MG PO TABS   Oral   Take 1 tablet by mouth daily.   30 tablet   2   . METHIMAZOLE 5 MG PO TABS   Oral   Take 1 tablet (5 mg total) by mouth 3 (three) times daily.   90 tablet   0   . POTASSIUM CHLORIDE ER 10 MEQ PO TBCR   Oral   Take 1 tablet (10 mEq total) by mouth 2 (two) times daily.   60 tablet   2     BP 119/101  Pulse 64  Temp 98.1 F (36.7 C) (Oral)  Resp 20  SpO2 100%  Physical Exam  Nursing note and vitals reviewed. Constitutional: She is oriented to person, place, and time. She appears well-developed and well-nourished. No distress.   HENT:  Head: Normocephalic and atraumatic.  Eyes: EOM are normal. Pupils are equal, round, and reactive to light.  Neck: Normal range of motion.  Cardiovascular: Normal rate and regular rhythm.   Abdominal: Soft. Bowel sounds are normal.  Musculoskeletal:       Small herpetic lesion noted above the buttocks on the left side approximately 3 cm in diameter a couple small pustular herpetic-appearing area, also patient has a healing cold sore on the right side of the lip  Neurological: She is alert and oriented to person, place, and time.  Skin: Skin is warm and dry.    ED Course  Procedures (including critical care time)  Labs Reviewed - No data to display No results found.  No diagnosis found.  MDM  IMPRESSION  Herpes simplex  RECOMMENDATIONS / PLAN  I explained to the patient that there is a chance that the cold sore her spread to the area of the buttocks where it may have been touched by her fingers if she had touched a cold sore previously.  I wonder place her on a cycle of year 400 mg by mouth 3 times a  day for 7 days.  She's also going to use topical Abreva on the cold sore for symptom relief.  Followup if no improvement or worsening symptoms.  FOLLOW UP As scheduled  The patient was given clear instructions to go to ER or return to medical center if symptoms don't improve, worsen or new problems develop.  The patient verbalized understanding.  The patient was told to call to get lab results if they haven't heard anything in the next week.            Cleora Fleet, MD 10/17/12 [phone]

## 2012-12-11 ENCOUNTER — Encounter (HOSPITAL_COMMUNITY): Payer: Self-pay | Admitting: *Deleted

## 2012-12-11 ENCOUNTER — Emergency Department (HOSPITAL_COMMUNITY)
Admission: EM | Admit: 2012-12-11 | Discharge: 2012-12-11 | Disposition: A | Discharge status: Home / Self Care | Payer: PRIVATE HEALTH INSURANCE | Attending: Emergency Medicine | Admitting: Emergency Medicine

## 2012-12-11 ENCOUNTER — Emergency Department (HOSPITAL_COMMUNITY): Payer: PRIVATE HEALTH INSURANCE

## 2012-12-11 DIAGNOSIS — E079 Disorder of thyroid, unspecified: Secondary | ICD-10-CM | POA: Insufficient documentation

## 2012-12-11 DIAGNOSIS — M5136 Other intervertebral disc degeneration, lumbar region: Secondary | ICD-10-CM

## 2012-12-11 DIAGNOSIS — Z87891 Personal history of nicotine dependence: Secondary | ICD-10-CM | POA: Insufficient documentation

## 2012-12-11 DIAGNOSIS — M5430 Sciatica, unspecified side: Secondary | ICD-10-CM | POA: Insufficient documentation

## 2012-12-11 DIAGNOSIS — Z79899 Other long term (current) drug therapy: Secondary | ICD-10-CM | POA: Insufficient documentation

## 2012-12-11 DIAGNOSIS — M1610 Unilateral primary osteoarthritis, unspecified hip: Secondary | ICD-10-CM | POA: Insufficient documentation

## 2012-12-11 DIAGNOSIS — E785 Hyperlipidemia, unspecified: Secondary | ICD-10-CM | POA: Insufficient documentation

## 2012-12-11 DIAGNOSIS — I10 Essential (primary) hypertension: Secondary | ICD-10-CM | POA: Insufficient documentation

## 2012-12-11 MED ORDER — NAPROXEN 500 MG PO TABS: 500.0000 mg | ORAL_TABLET | Freq: Two times a day (BID) | ORAL | Status: DC

## 2012-12-11 MED ORDER — CYCLOBENZAPRINE HCL 10 MG PO TABS: 10.0000 mg | ORAL_TABLET | Freq: Two times a day (BID) | ORAL | Status: DC | PRN

## 2012-12-11 MED ORDER — OXYCODONE-ACETAMINOPHEN 5-325 MG PO TABS
1.0000 | ORAL_TABLET | Freq: Once | ORAL | Status: AC
  Administered 2012-12-11: 1 via ORAL
  Filled 2012-12-11: qty 1

## 2012-12-11 NOTE — ED Notes (Signed)
Rt hip pain for 2 months.  No known injury

## 2012-12-11 NOTE — ED Notes (Addendum)
Pt returned from xray states tan jacket and black and gold colored purse with stars is missing. Pt. Attempted to contact friend who was with pt. Unable to reach. Security notified. Will call again if friend unreachable

## 2012-12-11 NOTE — ED Provider Notes (Signed)
  Medical screening examination/treatment/procedure(s) were performed by non-physician practitioner and as supervising physician I was immediately available for consultation/collaboration.    Gerhard Munch, MD 12/11/12 [phone]

## 2012-12-11 NOTE — ED Notes (Signed)
Pt discharged.Vital signs stable and GCS 15 

## 2012-12-11 NOTE — ED Notes (Signed)
Pt belongings returned to pt from friend.

## 2012-12-11 NOTE — ED Provider Notes (Signed)
History    This chart was scribed for non-physician practitioner working with Gerhard Munch, MD by Frederik Pear, ED Scribe. This patient was seen in room TR10C/TR10C and the patient's care was started at 1836.   CSN: [id number]  Arrival date & time 12/11/12  1753   First MD Initiated Contact with Patient 12/11/12 1836      Chief Complaint  Patient presents with  . Hip Pain    (Consider location/radiation/quality/duration/timing/severity/associated sxs/prior treatment) Patient is a 52 y.o. female presenting with hip pain. The history is provided by the patient. No language interpreter was used.  Hip Pain This is a new problem. The current episode started more than 1 week ago. The problem occurs constantly. The problem has been gradually worsening. Pertinent negatives include no abdominal pain. The symptoms are aggravated by walking.    Mariah Foster is a 52 y.o. female who presents to the Emergency Department complaining of constant, sudden onset, gradually worsening lower lumbar back pain that radiates down the bilateral buttocks to her right knee and is aggravated by walking and improved by nothing that began 2 months ago. She denies any h/o of similar pain. She denies any known trauma or injury to the area. In ED, she is ambulatory with a limp.  No PCP.  Past Medical History  Diagnosis Date  . Hypertension   . Hyperlipidemia   . Thyroid disease     History reviewed. No pertinent past surgical history.  No family history on file.  History  Substance Use Topics  . Smoking status: Former Smoker    Quit date: 06/28/2011  . Smokeless tobacco: Never Used  . Alcohol Use: No    OB History   Grav Para Term Preterm Abortions TAB SAB Ect Mult Living                  Review of Systems  Gastrointestinal: Negative for abdominal pain.  Musculoskeletal: Positive for back pain.  All other systems reviewed and are negative.    Allergies  Metronidazole hcl  Home  Medications   Current Outpatient Rx  Name  Route  Sig  Dispense  Refill  . albuterol (PROVENTIL HFA;VENTOLIN HFA) 108 (90 BASE) MCG/ACT inhaler   Inhalation   Inhale 2 puffs into the lungs every 6 (six) hours as needed for wheezing. For wheezing         . atenolol-chlorthalidone (TENORETIC) 50-25 MG per tablet   Oral   Take 1 tablet by mouth daily.   30 tablet   2   . ferrous sulfate 325 (65 FE) MG tablet   Oral   Take 325 mg by mouth daily with breakfast.         . ibuprofen (ADVIL,MOTRIN) 200 MG tablet   Oral   Take 400 mg by mouth every 4 (four) hours as needed for pain. For leg pain         . methimazole (TAPAZOLE) 5 MG tablet   Oral   Take 5 mg by mouth 3 (three) times daily.         . potassium chloride (K-DUR) 10 MEQ tablet   Oral   Take 10 mEq by mouth 2 (two) times daily as needed. For muscle spasms           BP 121/73  Pulse 68  Temp(Src) 97.3 F (36.3 C) (Oral)  Resp 18  SpO2 95%  Physical Exam  Nursing note and vitals reviewed. Constitutional: She is oriented to person, place, and time.  She appears well-developed and well-nourished. No distress.  HENT:  Head: Normocephalic and atraumatic.  Eyes: EOM are normal. Pupils are equal, round, and reactive to light.  Neck: Normal range of motion. Neck supple. No tracheal deviation present.  Cardiovascular: Normal rate and intact distal pulses.   Pulmonary/Chest: Effort normal. No respiratory distress.  Abdominal: Soft. She exhibits no distension.  Musculoskeletal: Normal range of motion. She exhibits no edema.  Tender over lateral paraspinal to the lower lumbar spine that radiates down the buttock to the knee. Good ROM.  Neurological: She is alert and oriented to person, place, and time. No sensory deficit.  Skin: Skin is warm and dry.  Psychiatric: She has a normal mood and affect. Her behavior is normal.    ED Course  Procedures (including critical care time)  DIAGNOSTIC STUDIES: Oxygen  Saturation is 98% on room air, normal by my interpretation.    COORDINATION OF CARE:  18:42- Discussed planned course of treatment with the patient, including antiinflammatories, muscle relaxer, and a lumbar spine  X-ray, who is agreeable at this time.  20:30- Medication Orders- oxycodone-acetaminophen (percocet/roxicet) 5-325 mg per tablet 1 tablet once.   Labs Reviewed - No data to display Dg Lumbar Spine Complete  12/11/2012  *RADIOLOGY REPORT*  Clinical Data: Low back pain extending in the right leg for 3 days.  LUMBAR SPINE - COMPLETE 4+ VIEW  Comparison: Report from 05/20/2003  Findings: Facet arthropathy and facet spurring noted at L4-5 and L5- S1 bilaterally, with loss of disc height and spurring anterior to the vertebral body column at this level.  There is mild reversal of the normal lumbar lordosis.  There is 2 mm of degenerative posterior subluxation at the L4-5. Aortoiliac atherosclerotic vascular disease noted.  IMPRESSION:  1.  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 2.  Reversal of the normal lumbar lordosis. 3.  Atherosclerosis.   Original Report Authenticated By: Gaylyn Rong, M.D.      No diagnosis found.  DDD of lumbar spine.  No urinary or bowel incontinence.  Follow-up with orthopedics.  MDM    I personally performed the services described in this documentation, which was scribed in my presence. The recorded information has been reviewed and is accurate.        Jimmye Norman, NP 12/11/12 2053

## 2012-12-28 ENCOUNTER — Emergency Department (HOSPITAL_COMMUNITY)
Admission: EM | Admit: 2012-12-28 | Discharge: 2012-12-28 | Disposition: A | Discharge status: Home / Self Care | Payer: BC Managed Care – PPO | Attending: Emergency Medicine | Admitting: Emergency Medicine

## 2012-12-28 ENCOUNTER — Encounter (HOSPITAL_COMMUNITY): Payer: Self-pay | Admitting: *Deleted

## 2012-12-28 DIAGNOSIS — M25559 Pain in unspecified hip: Secondary | ICD-10-CM | POA: Insufficient documentation

## 2012-12-28 DIAGNOSIS — M51379 Other intervertebral disc degeneration, lumbosacral region without mention of lumbar back pain or lower extremity pain: Secondary | ICD-10-CM | POA: Insufficient documentation

## 2012-12-28 DIAGNOSIS — IMO0002 Reserved for concepts with insufficient information to code with codable children: Secondary | ICD-10-CM | POA: Insufficient documentation

## 2012-12-28 DIAGNOSIS — M5136 Other intervertebral disc degeneration, lumbar region: Secondary | ICD-10-CM

## 2012-12-28 DIAGNOSIS — M5410 Radiculopathy, site unspecified: Secondary | ICD-10-CM

## 2012-12-28 DIAGNOSIS — E785 Hyperlipidemia, unspecified: Secondary | ICD-10-CM | POA: Insufficient documentation

## 2012-12-28 DIAGNOSIS — E079 Disorder of thyroid, unspecified: Secondary | ICD-10-CM | POA: Insufficient documentation

## 2012-12-28 DIAGNOSIS — Z79899 Other long term (current) drug therapy: Secondary | ICD-10-CM | POA: Insufficient documentation

## 2012-12-28 DIAGNOSIS — Z87891 Personal history of nicotine dependence: Secondary | ICD-10-CM | POA: Insufficient documentation

## 2012-12-28 DIAGNOSIS — I10 Essential (primary) hypertension: Secondary | ICD-10-CM | POA: Insufficient documentation

## 2012-12-28 DIAGNOSIS — M5137 Other intervertebral disc degeneration, lumbosacral region: Secondary | ICD-10-CM | POA: Insufficient documentation

## 2012-12-28 MED ORDER — GABAPENTIN 300 MG PO CAPS: 300.0000 mg | ORAL_CAPSULE | Freq: Three times a day (TID) | ORAL | Status: DC

## 2012-12-28 MED ORDER — IBUPROFEN 800 MG PO TABS: 800.0000 mg | ORAL_TABLET | Freq: Three times a day (TID) | ORAL | Status: DC

## 2012-12-28 MED ORDER — OXYCODONE-ACETAMINOPHEN 5-325 MG PO TABS
2.0000 | ORAL_TABLET | Freq: Once | ORAL | Status: AC
  Administered 2012-12-28: 2 via ORAL
  Filled 2012-12-28: qty 2

## 2012-12-28 NOTE — ED Notes (Signed)
Reports being seen here recently for right leg pain and told it was degenerative joint disease and given meds, no relief with the meds and still having pain to entire right leg/hip. Ambulatory at triage.

## 2012-12-28 NOTE — ED Provider Notes (Signed)
History  This chart was scribed for non-physician practitioner working with Mariah Chick, MD by Erskine Emery, ED Scribe. This patient was seen in room TR05C/TR05C and the patient's care was started at 22:15.   CSN: [id number]  Arrival date & time 12/28/12  1854   None     Chief Complaint  Patient presents with  . Leg Pain    (Consider location/radiation/quality/duration/timing/severity/associated sxs/prior treatment) The history is provided by the patient. No language interpreter was used.  Mariah Foster is a 52 y.o. female who presents to the Emergency Department complaining of right leg and hip pain for several months. Pt was seen here recently (12/12/12) for the same complaint. She was diagnosed with degenerative joint disease and given medications. Pt reports the medication does nothing to relieve her pain. Pt is ambulatory. She denies any new or old injury to the area. Denies any bowel or bladder incontinence, hx of cancer. Pt sees no primary care physician, back specialist, neurologist or pain management specialist.  Past Medical History  Diagnosis Date  . Hypertension   . Hyperlipidemia   . Thyroid disease     History reviewed. No pertinent past surgical history.  History reviewed. No pertinent family history.  History  Substance Use Topics  . Smoking status: Former Smoker    Quit date: 06/28/2011  . Smokeless tobacco: Never Used  . Alcohol Use: No    OB History   Grav Para Term Preterm Abortions TAB SAB Ect Mult Living                  Review of Systems  Constitutional: Negative for fever and diaphoresis.  HENT: Negative for neck pain and neck stiffness.   Eyes: Negative for visual disturbance.  Respiratory: Negative for apnea, chest tightness and shortness of breath.   Cardiovascular: Negative for chest pain and palpitations.  Gastrointestinal: Negative for nausea, vomiting, abdominal pain, diarrhea and constipation.  Genitourinary: Negative for dysuria.   Musculoskeletal: Positive for back pain, arthralgias and gait problem.       Right leg and hip pain  Skin: Negative for rash.  Neurological: Negative for dizziness, weakness, light-headedness, numbness and headaches.  All other systems reviewed and are negative.    Allergies  Metronidazole hcl  Home Medications   Current Outpatient Rx  Name  Route  Sig  Dispense  Refill  . albuterol (PROVENTIL HFA;VENTOLIN HFA) 108 (90 BASE) MCG/ACT inhaler   Inhalation   Inhale 2 puffs into the lungs every 6 (six) hours as needed for wheezing. For wheezing         . atenolol-chlorthalidone (TENORETIC) 50-25 MG per tablet   Oral   Take 1 tablet by mouth every morning.         . cyclobenzaprine (FLEXERIL) 10 MG tablet   Oral   Take 1 tablet (10 mg total) by mouth 2 (two) times daily as needed for muscle spasms.   20 tablet   0   . ferrous sulfate 325 (65 FE) MG tablet   Oral   Take 325 mg by mouth daily with breakfast.         . methimazole (TAPAZOLE) 5 MG tablet   Oral   Take 5 mg by mouth 3 (three) times daily.         . naproxen (NAPROSYN) 500 MG tablet   Oral   Take 500 mg by mouth 2 (two) times daily with a meal.         . potassium chloride (  K-DUR) 10 MEQ tablet   Oral   Take 10 mEq by mouth 2 (two) times daily as needed. For muscle spasms           Triage Vitals: BP 148/100  Pulse 73  Temp(Src) 98 F (36.7 C) (Oral)  Resp 18  SpO2 99%  Physical Exam  Nursing note and vitals reviewed. Constitutional: She is oriented to person, place, and time. She appears well-developed and well-nourished. No distress.  HENT:  Head: Normocephalic and atraumatic.  Eyes: Conjunctivae and EOM are normal.  Neck: Normal range of motion. Neck supple.  No meningeal signs  Cardiovascular: Normal rate, regular rhythm, normal heart sounds and intact distal pulses.  Exam reveals no gallop and no friction rub.   No murmur heard. Pulmonary/Chest: Effort normal and breath sounds  normal. No respiratory distress. She has no wheezes. She has no rales. She exhibits no tenderness.  Abdominal: Soft. Bowel sounds are normal. She exhibits no distension. There is no tenderness. There is no rebound and no guarding.  Musculoskeletal: Normal range of motion. She exhibits tenderness. She exhibits no edema.  Tender over lateral paraspinal to lower left spine that radiates down posterior thigh of right leg into popiteal fossa. Good ROM, with pain.  Neurological: She is alert and oriented to person, place, and time. No cranial nerve deficit.  Skin: Skin is warm and dry. She is not diaphoretic. No erythema.    ED Course  Procedures (including critical care time) DIAGNOSTIC STUDIES: Oxygen Saturation is 99% on room air, normal by my interpretation.    COORDINATION OF CARE: 22:15--I evaluated the patient and we discussed a treatment plan including a walker, pain medication, and referral to pain management specialist to which the pt agreed. Pt requests a work note until next week, 01/05/13.   Labs Reviewed - No data to display No results found.   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 Had lengthy discussion over her condition with the pt who claims no one ever explained her condition to her. Discussed that there is no acute injury to treat and that this is a chronic condition that will require management and care on a long term basis. Pt is not followed by a primary care doctor. Urged pt to establish relationship with a primary care doctor who could recommend her to appropriate specialists as well as pain management doctor. Explained that taking narcotic medicine without appropriate follow up could lead to other health issues and that she needed to be followed by a doctor. Pt states she does have a walker at home. Provided resource list and urged pt to find a doctor who could work with her long term.   At this  time there does not appear to be any evidence of an acute emergency medical condition and the patient appears stable for discharge with appropriate outpatient follow up.Diagnosis was discussed with patient who verbalizes understanding and is agreeable to discharge.   I personally performed the services described in this documentation, which was scribed in my presence. The recorded information has been reviewed and is accurate.    Glade Nurse, PA-C 12/31/12 1151

## 2012-12-29 ENCOUNTER — Ambulatory Visit: Payer: BC Managed Care – PPO

## 2012-12-29 ENCOUNTER — Ambulatory Visit (INDEPENDENT_AMBULATORY_CARE_PROVIDER_SITE_OTHER): Payer: BC Managed Care – PPO | Admitting: Family Medicine

## 2012-12-29 VITALS — BP 124/84 | HR 85 | Temp 97.9°F | Resp 16 | Ht 64.0 in | Wt 138.0 lb

## 2012-12-29 DIAGNOSIS — M545 Low back pain, unspecified: Secondary | ICD-10-CM

## 2012-12-29 DIAGNOSIS — R5381 Other malaise: Secondary | ICD-10-CM

## 2012-12-29 DIAGNOSIS — M47817 Spondylosis without myelopathy or radiculopathy, lumbosacral region: Secondary | ICD-10-CM

## 2012-12-29 DIAGNOSIS — M25559 Pain in unspecified hip: Secondary | ICD-10-CM

## 2012-12-29 DIAGNOSIS — R300 Dysuria: Secondary | ICD-10-CM

## 2012-12-29 DIAGNOSIS — M25551 Pain in right hip: Secondary | ICD-10-CM

## 2012-12-29 DIAGNOSIS — E0590 Thyrotoxicosis, unspecified without thyrotoxic crisis or storm: Secondary | ICD-10-CM

## 2012-12-29 DIAGNOSIS — R5383 Other fatigue: Secondary | ICD-10-CM

## 2012-12-29 DIAGNOSIS — I10 Essential (primary) hypertension: Secondary | ICD-10-CM

## 2012-12-29 LAB — POCT UA - MICROSCOPIC ONLY

## 2012-12-29 LAB — POCT URINALYSIS DIPSTICK
Bilirubin, UA: NEGATIVE
Ketones, UA: NEGATIVE
Leukocytes, UA: NEGATIVE
pH, UA: 5.5

## 2012-12-29 LAB — T3, FREE: T3, Free: 3.4 pg/mL (ref 2.3–4.2)

## 2012-12-29 LAB — GLUCOSE, POCT (MANUAL RESULT ENTRY): POC Glucose: 83 mg/dl (ref 70–99)

## 2012-12-29 LAB — T4, FREE: Free T4: 1.23 ng/dL (ref 0.80–1.80)

## 2012-12-29 MED ORDER — ATENOLOL-CHLORTHALIDONE 50-25 MG PO TABS: 1.0000 | ORAL_TABLET | Freq: Every morning | ORAL | Status: DC

## 2012-12-29 MED ORDER — GABAPENTIN 300 MG PO CAPS: 300.0000 mg | ORAL_CAPSULE | Freq: Three times a day (TID) | ORAL | Status: DC

## 2012-12-29 MED ORDER — PREDNISONE 20 MG PO TABS: ORAL_TABLET | ORAL | Status: DC

## 2012-12-29 MED ORDER — METHIMAZOLE 5 MG PO TABS: 5.0000 mg | ORAL_TABLET | Freq: Three times a day (TID) | ORAL | Status: DC

## 2012-12-29 MED ORDER — OXAPROZIN 600 MG PO TABS: ORAL_TABLET | ORAL | Status: DC

## 2012-12-29 NOTE — Patient Instructions (Addendum)
Take the prednisone 3 tablets daily for 2 days, then 2 tablets daily for 2 days, then one tablet daily for 2 days  Take the Daypro (oxaprosin) one at breakfast and one at supper for pain and inflammation  Take the gabapentin 300 mg one pill in the morning and one pill in the evening for 5 days, then on Sunday begin taking one pill 3 times every day. This is for the persistent pain, and will take building up in your system before you notice it starts giving you relief after a week or 2.  Continue taking your blood pressure and thyroid medicine.  You need to find a primary care doctor. There are many family physicians in this time. I see advertisements for [place] [facility]. Also I believe that [facility] is taking patients.  When your laboratory tests come back I will decide if we need to refer you to a specialist.

## 2012-12-29 NOTE — Progress Notes (Signed)
Subjective: Patient has been to the emergency room with low back and right hip pain. They told her to get off primary care and to come here. She apparently has been hurting "for a minute". I asked her what that meant and she said the year or 2. She says somebody and said she is diabetic but that she's not. She is out of her medicines for her hypothyroidism and for her hypertension. She used to go to the free clinic which closed. She does have insurance now. She is a very difficult historian. Her  She apparently still works. 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 She is not able to walk hardly without a walker now. Not quite sure how she's been working.  Objective: Very tender in the low lumbar spine and right SI regions. Movement of the right hip with straight leg raising type movement shoots severe pain into her hip. She is tender down to the hip joint area also. Left side is not as tender. Her chest is clear. Heart regular without murmurs. No thyromegaly is noted. Abdomen soft without mass or tenderness.  Assessment: Low back and right SI pain, severe, radiating to hip Hip pain, severe History of hyperthyroidism  history of hypertension  Plan: X-ray hip  [facility] reading (PRIMARY) by  Dr. Alwyn Ren Degenerative changes and lipping at lumbosacral area. Probable arthritic changes in the right SI region. Hips look satisfactory.   Will place her on a taper of steroids to try and help the radicular pain . Diclofenac Refill of the hyperthyroidism and hypertension medication following a waiting for labs She is probably going to need to repeat referred to a back specialist and do an endocrinologist. Urged her to find a regular primary care.

## 2012-12-30 ENCOUNTER — Encounter: Payer: Self-pay | Admitting: Family Medicine

## 2012-12-31 ENCOUNTER — Telehealth: Payer: Self-pay

## 2012-12-31 DIAGNOSIS — M5431 Sciatica, right side: Secondary | ICD-10-CM

## 2012-12-31 DIAGNOSIS — M545 Low back pain: Secondary | ICD-10-CM

## 2012-12-31 NOTE — Telephone Encounter (Signed)
Contacted pt to advise her that [employer] will not cover the MRI Lumbar spine that Dr Alwyn Ren had ordered for her, but that he would like to send her to an ortho specialist. Pt agrees and states that she has not seen one in the past so whoever we recommend is fine. I am putting in order for ortho referral.

## 2013-01-06 NOTE — ED Provider Notes (Signed)
Medical screening examination/treatment/procedure(s) were performed by non-physician practitioner and as supervising physician I was immediately available for consultation/collaboration.  Martha K Linker, MD 01/06/13 1705 

## 2013-02-20 ENCOUNTER — Emergency Department (HOSPITAL_COMMUNITY)
Admission: EM | Admit: 2013-02-20 | Discharge: 2013-02-20 | Disposition: A | Discharge status: Home / Self Care | Payer: BC Managed Care – PPO | Attending: Emergency Medicine | Admitting: Emergency Medicine

## 2013-02-20 ENCOUNTER — Emergency Department (HOSPITAL_COMMUNITY): Payer: BC Managed Care – PPO

## 2013-02-20 ENCOUNTER — Encounter (HOSPITAL_COMMUNITY): Payer: Self-pay | Admitting: Family Medicine

## 2013-02-20 DIAGNOSIS — J3489 Other specified disorders of nose and nasal sinuses: Secondary | ICD-10-CM | POA: Insufficient documentation

## 2013-02-20 DIAGNOSIS — R05 Cough: Secondary | ICD-10-CM

## 2013-02-20 DIAGNOSIS — J029 Acute pharyngitis, unspecified: Secondary | ICD-10-CM

## 2013-02-20 DIAGNOSIS — J069 Acute upper respiratory infection, unspecified: Secondary | ICD-10-CM | POA: Insufficient documentation

## 2013-02-20 DIAGNOSIS — I10 Essential (primary) hypertension: Secondary | ICD-10-CM | POA: Insufficient documentation

## 2013-02-20 DIAGNOSIS — Z8639 Personal history of other endocrine, nutritional and metabolic disease: Secondary | ICD-10-CM | POA: Insufficient documentation

## 2013-02-20 DIAGNOSIS — Z87891 Personal history of nicotine dependence: Secondary | ICD-10-CM | POA: Insufficient documentation

## 2013-02-20 DIAGNOSIS — R059 Cough, unspecified: Secondary | ICD-10-CM | POA: Insufficient documentation

## 2013-02-20 DIAGNOSIS — Z862 Personal history of diseases of the blood and blood-forming organs and certain disorders involving the immune mechanism: Secondary | ICD-10-CM | POA: Insufficient documentation

## 2013-02-20 DIAGNOSIS — J329 Chronic sinusitis, unspecified: Secondary | ICD-10-CM | POA: Insufficient documentation

## 2013-02-20 MED ORDER — HYDROCOD POLST-CHLORPHEN POLST 10-8 MG/5ML PO LQCR: 5.0000 mL | Freq: Two times a day (BID) | ORAL | Status: DC | PRN

## 2013-02-20 MED ORDER — FLUTICASONE PROPIONATE 50 MCG/ACT NA SUSP: 2.0000 | Freq: Every day | NASAL | Status: DC

## 2013-02-20 MED ORDER — AMOXICILLIN 500 MG PO CAPS: 500.0000 mg | ORAL_CAPSULE | Freq: Three times a day (TID) | ORAL | Status: DC

## 2013-02-20 MED ORDER — PENICILLIN G BENZATHINE 1200000 UNIT/2ML IM SUSP
1.2000 10*6.[IU] | Freq: Once | INTRAMUSCULAR | Status: AC
  Administered 2013-02-20: 1.2 10*6.[IU] via INTRAMUSCULAR
  Filled 2013-02-20: qty 2

## 2013-02-20 NOTE — ED Notes (Signed)
C/O sore throat, cough with yellow sputum, headache and chills x 2 weeks. Lungs clear.

## 2013-02-20 NOTE — ED Notes (Signed)
Per pt she has been having cold like symptoms to include, cough, nasal congestion and sore throat.

## 2013-02-20 NOTE — ED Provider Notes (Signed)
Medical screening examination/treatment/procedure(s) were performed by non-physician practitioner and as supervising physician I was immediately available for consultation/collaboration.    Vida Roller, MD 02/20/13 2137

## 2013-02-20 NOTE — ED Provider Notes (Signed)
History     CSN: [id number]  Arrival date & time 02/20/13  7829   First MD Initiated Contact with Patient 02/20/13 1000      Chief Complaint  Patient presents with  . URI    (Consider location/radiation/quality/duration/timing/severity/associated sxs/prior treatment) HPI Comments: Patient presenting to the ED for cold symptoms including cough, nasal congestion, sore throat, and sinus pressure x 1 week.  Cough has been productive with yellow/green sputum.  States she has tried several over-the-counter medications without any significant relief of symptoms.  No recent sick contacts.  Denies any chest pain, SOB, fever, sweats, chills, or difficulty swallowing.  Pt has had a flu shot.  Patient is a 52 y.o. female presenting with URI. The history is provided by the patient.  URI Presenting symptoms: congestion, cough and sore throat     Past Medical History  Diagnosis Date  . Hypertension   . Hyperlipidemia   . Thyroid disease     History reviewed. No pertinent past surgical history.  History reviewed. No pertinent family history.  History  Substance Use Topics  . Smoking status: Former Smoker    Quit date: 06/28/2011  . Smokeless tobacco: Never Used  . Alcohol Use: No    OB History   Grav Para Term Preterm Abortions TAB SAB Ect Mult Living                  Review of Systems  HENT: Positive for congestion, sore throat and sinus pressure.   Respiratory: Positive for cough.   All other systems reviewed and are negative.    Allergies  Metronidazole hcl  Home Medications   Current Outpatient Rx  Name  Route  Sig  Dispense  Refill  . PRESCRIPTION MEDICATION   Oral   Take 1 tablet by mouth daily. Blood pressure medication.           BP 124/70  Pulse 69  Temp(Src) 98.4 F (36.9 C) (Oral)  Resp 20  SpO2 100%  Physical Exam  Nursing note and vitals reviewed. Constitutional: She is oriented to person, place, and time. She appears well-developed and  well-nourished.  HENT:  Head: Normocephalic and atraumatic. No trismus in the jaw.  Right Ear: Tympanic membrane and ear canal normal.  Left Ear: Tympanic membrane and ear canal normal.  Nose: Mucosal edema present. Right sinus exhibits maxillary sinus tenderness and frontal sinus tenderness. Left sinus exhibits maxillary sinus tenderness and frontal sinus tenderness.  Mouth/Throat: Uvula is midline, oropharynx is clear and moist and mucous membranes are normal. No edematous. No oropharyngeal exudate, posterior oropharyngeal edema, posterior oropharyngeal erythema or tonsillar abscesses.  Turbinates swollen and erythematous  Eyes: Conjunctivae and EOM are normal. Pupils are equal, round, and reactive to light.  Neck: Normal range of motion. Neck supple. No spinous process tenderness and no muscular tenderness present. No rigidity.  Cardiovascular: Normal rate, regular rhythm and normal heart sounds.   Pulmonary/Chest: Effort normal. No respiratory distress. She has wheezes in the right lower field.  Musculoskeletal: Normal range of motion.  Neurological: She is alert and oriented to person, place, and time.  Skin: Skin is warm and dry.  Psychiatric: She has a normal mood and affect.    ED Course  Procedures (including critical care time)  Labs Reviewed - No data to display Dg Chest 2 View  02/20/2013  *RADIOLOGY REPORT*  Clinical Data: Upper respiratory infection  CHEST - 2 VIEW  Comparison: April 25, 2011.  Findings: Cardiomediastinal silhouette appears normal.  No acute pulmonary disease is noted.  Bony thorax is intact.  IMPRESSION: No acute cardiopulmonary abnormality seen.   Original Report Authenticated By: Lupita Raider.,  M.D.      1. Sinusitis   2. Pharyngitis   3. Cough       MDM   Chest x-ray without evidence of acute cardiopulmonary processes. Likely sinusitis/URI.  Pt afebrile, non-toxic appearing, NAD, VS stable.  Rx amoxicillin, Tussionex and Flonase. Patient states  she cannot fill prescriptions until next week when she gets paid- Bicillin given in the ED until she is able to fill them.  Followup with primary care physician if symptoms do not improve. Discussed plan with patient, she agreed. Return precautions advised.       Garlon Hatchet, PA-C 02/20/13 1728

## 2013-04-07 ENCOUNTER — Other Ambulatory Visit (HOSPITAL_COMMUNITY): Payer: Self-pay | Admitting: *Deleted

## 2013-04-07 DIAGNOSIS — Z1231 Encounter for screening mammogram for malignant neoplasm of breast: Secondary | ICD-10-CM

## 2013-05-15 ENCOUNTER — Ambulatory Visit (HOSPITAL_COMMUNITY)
Admission: RE | Admit: 2013-05-15 | Discharge: 2013-05-15 | Disposition: A | Discharge status: Home / Self Care | Payer: Self-pay | Source: Ambulatory Visit | Attending: Emergency Medicine | Admitting: Emergency Medicine

## 2013-05-15 DIAGNOSIS — Z1231 Encounter for screening mammogram for malignant neoplasm of breast: Secondary | ICD-10-CM | POA: Insufficient documentation

## 2013-09-28 ENCOUNTER — Emergency Department (HOSPITAL_COMMUNITY): Payer: Self-pay

## 2013-09-28 ENCOUNTER — Emergency Department (HOSPITAL_COMMUNITY)
Admission: EM | Admit: 2013-09-28 | Discharge: 2013-09-28 | Disposition: A | Discharge status: Home / Self Care | Payer: Self-pay | Attending: Emergency Medicine | Admitting: Emergency Medicine

## 2013-09-28 ENCOUNTER — Encounter (HOSPITAL_COMMUNITY): Payer: Self-pay | Admitting: Emergency Medicine

## 2013-09-28 DIAGNOSIS — R059 Cough, unspecified: Secondary | ICD-10-CM | POA: Insufficient documentation

## 2013-09-28 DIAGNOSIS — X503XXA Overexertion from repetitive movements, initial encounter: Secondary | ICD-10-CM | POA: Insufficient documentation

## 2013-09-28 DIAGNOSIS — R05 Cough: Secondary | ICD-10-CM | POA: Insufficient documentation

## 2013-09-28 DIAGNOSIS — D649 Anemia, unspecified: Secondary | ICD-10-CM | POA: Insufficient documentation

## 2013-09-28 DIAGNOSIS — I10 Essential (primary) hypertension: Secondary | ICD-10-CM | POA: Insufficient documentation

## 2013-09-28 DIAGNOSIS — Z79899 Other long term (current) drug therapy: Secondary | ICD-10-CM | POA: Insufficient documentation

## 2013-09-28 DIAGNOSIS — IMO0002 Reserved for concepts with insufficient information to code with codable children: Secondary | ICD-10-CM | POA: Insufficient documentation

## 2013-09-28 DIAGNOSIS — M549 Dorsalgia, unspecified: Secondary | ICD-10-CM

## 2013-09-28 DIAGNOSIS — R1084 Generalized abdominal pain: Secondary | ICD-10-CM | POA: Insufficient documentation

## 2013-09-28 DIAGNOSIS — Z8639 Personal history of other endocrine, nutritional and metabolic disease: Secondary | ICD-10-CM | POA: Insufficient documentation

## 2013-09-28 DIAGNOSIS — Y929 Unspecified place or not applicable: Secondary | ICD-10-CM | POA: Insufficient documentation

## 2013-09-28 DIAGNOSIS — K922 Gastrointestinal hemorrhage, unspecified: Secondary | ICD-10-CM | POA: Insufficient documentation

## 2013-09-28 DIAGNOSIS — Y9389 Activity, other specified: Secondary | ICD-10-CM | POA: Insufficient documentation

## 2013-09-28 DIAGNOSIS — Z862 Personal history of diseases of the blood and blood-forming organs and certain disorders involving the immune mechanism: Secondary | ICD-10-CM | POA: Insufficient documentation

## 2013-09-28 DIAGNOSIS — R079 Chest pain, unspecified: Secondary | ICD-10-CM | POA: Insufficient documentation

## 2013-09-28 LAB — COMPREHENSIVE METABOLIC PANEL
AST: 14 U/L (ref 0–37)
Albumin: 3.7 g/dL (ref 3.5–5.2)
Calcium: 9.1 mg/dL (ref 8.4–10.5)
Chloride: 105 mEq/L (ref 96–112)
Creatinine, Ser: 0.94 mg/dL (ref 0.50–1.10)
Total Bilirubin: 0.2 mg/dL — ABNORMAL LOW (ref 0.3–1.2)
Total Protein: 7.2 g/dL (ref 6.0–8.3)

## 2013-09-28 LAB — CBC WITH DIFFERENTIAL/PLATELET
HCT: 31.4 % — ABNORMAL LOW (ref 36.0–46.0)
Hemoglobin: 10.2 g/dL — ABNORMAL LOW (ref 12.0–15.0)
Lymphocytes Relative: 32 % (ref 12–46)
Monocytes Absolute: 0.8 10*3/uL (ref 0.1–1.0)
Monocytes Relative: 11 % (ref 3–12)
Neutro Abs: 3.9 10*3/uL (ref 1.7–7.7)
Neutrophils Relative %: 54 % (ref 43–77)
RBC: 3.71 MIL/uL — ABNORMAL LOW (ref 3.87–5.11)
WBC: 7.2 10*3/uL (ref 4.0–10.5)

## 2013-09-28 LAB — URINALYSIS, ROUTINE W REFLEX MICROSCOPIC
Glucose, UA: NEGATIVE mg/dL
Leukocytes, UA: NEGATIVE
Nitrite: NEGATIVE
Specific Gravity, Urine: 1.03 (ref 1.005–1.030)
pH: 5.5 (ref 5.0–8.0)

## 2013-09-28 LAB — OCCULT BLOOD, POC DEVICE: Fecal Occult Bld: POSITIVE — AB

## 2013-09-28 MED ORDER — SODIUM CHLORIDE 0.9 % IV SOLN
1000.0000 mL | Freq: Once | INTRAVENOUS | Status: AC
  Administered 2013-09-28: 1000 mL via INTRAVENOUS

## 2013-09-28 MED ORDER — CYCLOBENZAPRINE HCL 5 MG PO TABS: 5.0000 mg | ORAL_TABLET | Freq: Three times a day (TID) | ORAL | Status: DC | PRN

## 2013-09-28 MED ORDER — SODIUM CHLORIDE 0.9 % IV SOLN
1000.0000 mL | INTRAVENOUS | Status: DC
  Administered 2013-09-28: 1000 mL via INTRAVENOUS

## 2013-09-28 MED ORDER — HYDROCODONE-ACETAMINOPHEN 5-325 MG PO TABS: 1.0000 | ORAL_TABLET | ORAL | Status: DC | PRN

## 2013-09-28 MED ORDER — PANTOPRAZOLE SODIUM 20 MG PO TBEC: 20.0000 mg | DELAYED_RELEASE_TABLET | Freq: Two times a day (BID) | ORAL | Status: DC

## 2013-09-28 MED ORDER — MORPHINE SULFATE 4 MG/ML IJ SOLN
4.0000 mg | Freq: Once | INTRAMUSCULAR | Status: AC
  Administered 2013-09-28: 4 mg via INTRAVENOUS
  Filled 2013-09-28: qty 1

## 2013-09-28 MED ORDER — ONDANSETRON HCL 4 MG/2ML IJ SOLN
4.0000 mg | Freq: Once | INTRAMUSCULAR | Status: AC
  Administered 2013-09-28: 4 mg via INTRAVENOUS
  Filled 2013-09-28: qty 2

## 2013-09-28 NOTE — ED Notes (Signed)
NOTIFIED DR. JOHN KNAPP IN PERSON OF LAB RESULTS OF POCT OCCULT BLOOD STOOL = POSITIVE ( + ) RESULTS @12 :15 PM , 09/28/2013.

## 2013-09-28 NOTE — ED Notes (Signed)
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  No incontinence

## 2013-09-28 NOTE — ED Notes (Signed)
PT C/O LBP BUT ALSO C/O UPPER ABD AND CHEST PAIN. WILL MOVE TO ACUTE SIDE.

## 2013-09-28 NOTE — ED Provider Notes (Signed)
CSN: [id number]     Arrival date & time 09/28/13  4098 History   First MD Initiated Contact with Patient 09/28/13 0911     Chief Complaint  Patient presents with  . Back Pain    HPI Comments: Pt was moving a large entertainment center on Friday.  Since doing that she has been having pain in her lower back but it has also moved into the abdomen and the chest.  Appetite has been fine.  No vomiting or diarrhea.   Patient is a 52 y.o. female presenting with back pain. The history is provided by the patient.  Back Pain Quality:  Aching Pain severity:  Moderate Onset quality:  Gradual Duration:  3 days Timing:  Constant Chronicity:  Recurrent Context: lifting heavy objects   Context: not falling   Relieved by:  Nothing Worsened by:  Nothing tried Associated symptoms: chest pain   Associated symptoms: no bladder incontinence, no bowel incontinence, no dysuria and no fever   She has noticed a dark color to her stool today.  Past Medical History  Diagnosis Date  . Hypertension   . Hyperlipidemia   . Thyroid disease    History reviewed. No pertinent past surgical history. No family history on file. History  Substance Use Topics  . Smoking status: Former Smoker    Quit date: 06/28/2011  . Smokeless tobacco: Never Used  . Alcohol Use: No   OB History   Grav Para Term Preterm Abortions TAB SAB Ect Mult Living                 Review of Systems  Constitutional: Negative for fever.  Respiratory: Positive for cough. Negative for shortness of breath.   Cardiovascular: Positive for chest pain.  Gastrointestinal: Negative for bowel incontinence.  Genitourinary: Negative for bladder incontinence and dysuria.  Musculoskeletal: Positive for back pain.    Allergies  Metronidazole hcl  Home Medications   Current Outpatient Rx  Name  Route  Sig  Dispense  Refill  . amoxicillin (AMOXIL) 875 MG tablet   Oral   Take 875 mg by mouth 2 (two) times daily.         .  hydrochlorothiazide (HYDRODIURIL) 25 MG tablet   Oral   Take 25 mg by mouth daily.         Marland Kitchen lisinopril (PRINIVIL,ZESTRIL) 20 MG tablet   Oral   Take 40 mg by mouth daily.         . cyclobenzaprine (FLEXERIL) 5 MG tablet   Oral   Take 1 tablet (5 mg total) by mouth 3 (three) times daily as needed for muscle spasms.   21 tablet   0   . HYDROcodone-acetaminophen (NORCO/VICODIN) 5-325 MG per tablet   Oral   Take 1-2 tablets by mouth every 4 (four) hours as needed.   20 tablet   0   . pantoprazole (PROTONIX) 20 MG tablet   Oral   Take 1 tablet (20 mg total) by mouth 2 (two) times daily.   20 tablet   0    BP 148/77  Pulse 65  Temp(Src) 97.7 F (36.5 C) (Oral)  Resp 18  SpO2 100% Physical Exam  Nursing note and vitals reviewed. Constitutional: She appears well-developed and well-nourished. No distress.  HENT:  Head: Normocephalic and atraumatic.  Right Ear: External ear normal.  Left Ear: External ear normal.  Nose: Nose normal.  Eyes: Conjunctivae and EOM are normal. Right eye exhibits no discharge. Left eye exhibits no discharge.  No scleral icterus.  Neck: Neck supple. No tracheal deviation present.  Cardiovascular: Normal rate, regular rhythm and intact distal pulses.   Pulmonary/Chest: Effort normal and breath sounds normal. No stridor. No respiratory distress. She has no wheezes. She has no rales.  Abdominal: Soft. Bowel sounds are normal. She exhibits no distension, no pulsatile midline mass and no mass. There is generalized tenderness. There is CVA tenderness. There is no rebound and no guarding. No hernia (bilateral).  Genitourinary: Rectal exam shows no mass, no tenderness and anal tone normal. Guaiac positive stool.  Musculoskeletal: She exhibits tenderness. She exhibits no edema.       Right shoulder: She exhibits decreased range of motion and tenderness. She exhibits no bony tenderness.       Thoracic back: She exhibits tenderness. She exhibits no bony  tenderness, no swelling, no edema and no deformity.       Lumbar back: She exhibits decreased range of motion, pain and spasm. She exhibits no swelling, no edema and no deformity.  Movement increases pain in back  Neurological: She is alert. She has normal strength. She is not disoriented. No sensory deficit. Cranial nerve deficit:  no gross defecits noted. She exhibits normal muscle tone. She displays no seizure activity. Coordination normal.  Reflex Scores:      Patellar reflexes are 2+ on the right side and 2+ on the left side.      Achilles reflexes are 2+ on the right side and 2+ on the left side. Skin: Skin is warm and dry. No rash noted. She is not diaphoretic. No erythema.  Psychiatric: She has a normal mood and affect. Her behavior is normal. Thought content normal.    ED Course  Procedures (including critical care time) Labs Review Labs Reviewed  COMPREHENSIVE METABOLIC PANEL - Abnormal; Notable for the following:    Glucose, Bld 100 (*)    BUN 24 (*)    Total Bilirubin 0.2 (*)    GFR calc non Af Amer 68 (*)    GFR calc Af Amer 78 (*)    All other components within normal limits  CBC WITH DIFFERENTIAL - Abnormal; Notable for the following:    RBC 3.71 (*)    Hemoglobin 10.2 (*)    HCT 31.4 (*)    RDW 16.7 (*)    Platelets 416 (*)    All other components within normal limits  OCCULT BLOOD, POC DEVICE - Abnormal; Notable for the following:    Fecal Occult Bld POSITIVE (*)    All other components within normal limits  LIPASE, BLOOD  URINALYSIS, ROUTINE W REFLEX MICROSCOPIC   Imaging Review Dg Abd Acute W/chest  09/28/2013   CLINICAL DATA:  Abdomen pain  EXAM: ACUTE ABDOMEN SERIES (ABDOMEN 2 VIEW & CHEST 1 VIEW)  COMPARISON:  Chest x-ray February 20, 2013  FINDINGS: There is no evidence of dilated bowel loops or free intraperitoneal air. No radiopaque calculi or other significant radiographic abnormality is seen. Pelvic phleboliths are noted. There is scoliosis of lumbar  spine. Heart size and mediastinal contours are within normal limits. Both lungs are clear.  IMPRESSION: No bowel obstruction or free air.  No acute cardiopulmonary disease.   Electronically Signed   By: Sherian Rein M.D.   On: 09/28/2013 10:07    EKG Interpretation   None       MDM   1. GI bleeding   2. Anemia   3. Back pain     Back pain seems to be  musculoskeletal however pt has noticed dark stools, complained of abdominal pain and has anemia with a positive guiac card.  Will consult with GI, she may have a component of acute gi bleed.  Pt has been taking nsaids.  I Consulted with Dr. Loreta Ave.  Patient will followup with her as an outpatient. She remains hemodynamically stable. I suspect her symptoms are likely related to her NSAID use because of her back pain. The patient on discontinuing all NSAIDs. Understands to return to emergency room for worsening pain bright red blood in her stool, feeling faint or lightheaded   Celene Kras, MD 09/28/13 1230

## 2013-10-02 ENCOUNTER — Emergency Department (HOSPITAL_COMMUNITY)
Admission: EM | Admit: 2013-10-02 | Discharge: 2013-10-02 | Disposition: A | Discharge status: Home / Self Care | Payer: Self-pay | Attending: Emergency Medicine | Admitting: Emergency Medicine

## 2013-10-02 ENCOUNTER — Encounter (HOSPITAL_COMMUNITY): Payer: Self-pay | Admitting: Emergency Medicine

## 2013-10-02 ENCOUNTER — Emergency Department (HOSPITAL_COMMUNITY): Payer: Self-pay

## 2013-10-02 DIAGNOSIS — M545 Low back pain, unspecified: Secondary | ICD-10-CM | POA: Insufficient documentation

## 2013-10-02 DIAGNOSIS — Z79899 Other long term (current) drug therapy: Secondary | ICD-10-CM | POA: Insufficient documentation

## 2013-10-02 DIAGNOSIS — Z87891 Personal history of nicotine dependence: Secondary | ICD-10-CM | POA: Insufficient documentation

## 2013-10-02 DIAGNOSIS — Z862 Personal history of diseases of the blood and blood-forming organs and certain disorders involving the immune mechanism: Secondary | ICD-10-CM | POA: Insufficient documentation

## 2013-10-02 DIAGNOSIS — I10 Essential (primary) hypertension: Secondary | ICD-10-CM | POA: Insufficient documentation

## 2013-10-02 DIAGNOSIS — K922 Gastrointestinal hemorrhage, unspecified: Secondary | ICD-10-CM | POA: Insufficient documentation

## 2013-10-02 DIAGNOSIS — Z8639 Personal history of other endocrine, nutritional and metabolic disease: Secondary | ICD-10-CM | POA: Insufficient documentation

## 2013-10-02 HISTORY — DX: Gastrointestinal hemorrhage, unspecified: K92.2

## 2013-10-02 LAB — CBC WITH DIFFERENTIAL/PLATELET
Eosinophils Absolute: 0.2 10*3/uL (ref 0.0–0.7)
Hemoglobin: 10 g/dL — ABNORMAL LOW (ref 12.0–15.0)
Lymphocytes Relative: 35 % (ref 12–46)
Lymphs Abs: 3.1 10*3/uL (ref 0.7–4.0)
MCH: 26.7 pg (ref 26.0–34.0)
Monocytes Relative: 7 % (ref 3–12)
Neutro Abs: 4.8 10*3/uL (ref 1.7–7.7)
Neutrophils Relative %: 55 % (ref 43–77)
Platelets: 465 10*3/uL — ABNORMAL HIGH (ref 150–400)
RBC: 3.75 MIL/uL — ABNORMAL LOW (ref 3.87–5.11)
WBC: 8.8 10*3/uL (ref 4.0–10.5)

## 2013-10-02 LAB — URINALYSIS, ROUTINE W REFLEX MICROSCOPIC
Bilirubin Urine: NEGATIVE
Glucose, UA: NEGATIVE mg/dL
Ketones, ur: NEGATIVE mg/dL
Leukocytes, UA: NEGATIVE
Nitrite: NEGATIVE
Protein, ur: NEGATIVE mg/dL
Urobilinogen, UA: 1 mg/dL (ref 0.0–1.0)
pH: 7.5 (ref 5.0–8.0)

## 2013-10-02 LAB — COMPREHENSIVE METABOLIC PANEL
ALT: 21 U/L (ref 0–35)
Alkaline Phosphatase: 119 U/L — ABNORMAL HIGH (ref 39–117)
BUN: 17 mg/dL (ref 6–23)
Chloride: 102 mEq/L (ref 96–112)
GFR calc Af Amer: 66 mL/min — ABNORMAL LOW (ref >90)
Glucose, Bld: 102 mg/dL — ABNORMAL HIGH (ref 70–99)
Potassium: 3.7 mEq/L (ref 3.5–5.1)
Sodium: 140 mEq/L (ref 135–145)
Total Bilirubin: 0.4 mg/dL (ref 0.3–1.2)
Total Protein: 7.5 g/dL (ref 6.0–8.3)

## 2013-10-02 MED ORDER — HYDROMORPHONE HCL PF 1 MG/ML IJ SOLN
1.0000 mg | Freq: Once | INTRAMUSCULAR | Status: AC
  Administered 2013-10-02: 1 mg via INTRAVENOUS
  Filled 2013-10-02: qty 1

## 2013-10-02 MED ORDER — IOHEXOL 300 MG/ML  SOLN
25.0000 mL | Freq: Once | INTRAMUSCULAR | Status: AC | PRN
  Administered 2013-10-02: 25 mL via ORAL

## 2013-10-02 MED ORDER — ONDANSETRON HCL 4 MG/2ML IJ SOLN
4.0000 mg | Freq: Once | INTRAMUSCULAR | Status: AC
  Administered 2013-10-02: 4 mg via INTRAVENOUS
  Filled 2013-10-02: qty 2

## 2013-10-02 MED ORDER — OXYCODONE-ACETAMINOPHEN 5-325 MG PO TABS: 1.0000 | ORAL_TABLET | Freq: Four times a day (QID) | ORAL | Status: DC | PRN

## 2013-10-02 MED ORDER — IOHEXOL 300 MG/ML  SOLN
100.0000 mL | Freq: Once | INTRAMUSCULAR | Status: AC | PRN
  Administered 2013-10-02: 100 mL via INTRAVENOUS

## 2013-10-02 NOTE — ED Notes (Signed)
Patient transported to CT 

## 2013-10-02 NOTE — ED Notes (Signed)
Pt was diagnosed on Sunday here in the ED with GI bleed and anemia. Pt was referred to GI MD and pt did not go because of the cost of the visit. Pt now presents with back pain that radiates to lower abdomen and feels bloated. Pt has significant left groin pain as well.

## 2013-10-02 NOTE — ED Provider Notes (Signed)
Medical screening examination/treatment/procedure(s) were performed by non-physician practitioner and as supervising physician I was immediately available for consultation/collaboration.  EKG Interpretation   None         Navjot Pilgrim N Tedra Coppernoll, DO 10/02/13 2358 

## 2013-10-02 NOTE — ED Notes (Signed)
Introduced self to patient.  Reports she was seen here 5 days ago, given medication x 3, sent home.  States the medication has not helped her at all.  Upon palpation of abdomen, pain radiates sharply to back.  Pt has worked all week.

## 2013-10-02 NOTE — ED Notes (Signed)
Pt completed oral contrast.  Xray notifed.

## 2013-10-02 NOTE — ED Notes (Signed)
Pt able to keep fluids down. Pt denies nausea.

## 2013-10-02 NOTE — ED Provider Notes (Signed)
CSN: [id number]     Arrival date & time 10/02/13  1448 History   First MD Initiated Contact with Patient 10/02/13 1951     Chief Complaint  Patient presents with  . Back Pain  . Abdominal Pain   (Consider location/radiation/quality/duration/timing/severity/associated sxs/prior Treatment) HPI  Mariah Foster is a 52 y.o.female with a significant PMH of hypertension, hyperlipidemia, thyroid disease, GI bleed, low back pain  presents to the ER with complaints of low back pain and abdominal pain. She was seen in the emergency department this past Sunday and diagnosed with low back strain and positive stool guaiac test. He was referred to Dr. Loreta Ave with GI but when she called to schedule an appointment they wanted $150 up front and she can not afford this after paying $81 for her medications. The flexeril, Norco and protonix which she does not feel are helping her at all.    Past Medical History  Diagnosis Date  . Hypertension   . Hyperlipidemia   . Thyroid disease   . GI bleed    History reviewed. No pertinent past surgical history. History reviewed. No pertinent family history. History  Substance Use Topics  . Smoking status: Former Smoker    Quit date: 06/28/2011  . Smokeless tobacco: Never Used  . Alcohol Use: No   OB History   Grav Para Term Preterm Abortions TAB SAB Ect Mult Living                 Review of Systems The patient denies anorexia, fever, weight loss,, vision loss, decreased hearing, hoarseness, chest pain, syncope, dyspnea on exertion, peripheral edema, balance deficits, hemoptysis, abdominal pain, melena, hematochezia, severe indigestion/heartburn, hematuria, incontinence, genital sores, muscle weakness, suspicious skin lesions, transient blindness, difficulty walking, depression, unusual weight change, abnormal bleeding, enlarged lymph nodes, angioedema, and breast masses.  Allergies  Metronidazole hcl  Home Medications   Current Outpatient Rx  Name  Route   Sig  Dispense  Refill  . cyclobenzaprine (FLEXERIL) 5 MG tablet   Oral   Take 1 tablet (5 mg total) by mouth 3 (three) times daily as needed for muscle spasms.   21 tablet   0   . hydrochlorothiazide (HYDRODIURIL) 25 MG tablet   Oral   Take 25 mg by mouth daily.         Marland Kitchen HYDROcodone-acetaminophen (NORCO/VICODIN) 5-325 MG per tablet   Oral   Take 1-2 tablets by mouth every 4 (four) hours as needed.   20 tablet   0   . lisinopril (PRINIVIL,ZESTRIL) 20 MG tablet   Oral   Take 40 mg by mouth daily.         . pantoprazole (PROTONIX) 20 MG tablet   Oral   Take 1 tablet (20 mg total) by mouth 2 (two) times daily.   20 tablet   0   . oxyCODONE-acetaminophen (PERCOCET/ROXICET) 5-325 MG per tablet   Oral   Take 1-2 tablets by mouth every 6 (six) hours as needed for severe pain.   20 tablet   0    BP 166/93  Pulse 70  Temp(Src) 97.3 F (36.3 C) (Oral)  Resp 16  SpO2 99% Physical Exam  Nursing note and vitals reviewed. Constitutional: She appears well-developed and well-nourished. No distress.  HENT:  Head: Normocephalic and atraumatic.  Eyes: Pupils are equal, round, and reactive to light.  Neck: Normal range of motion. Neck supple.  Cardiovascular: Normal rate and regular rhythm.   Pulmonary/Chest: Effort normal.  Abdominal: Soft.  There is tenderness.    Musculoskeletal:       Back:  Neurological: She is alert.  Skin: Skin is warm and dry.    ED Course  Procedures (including critical care time) Labs Review Labs Reviewed  CBC WITH DIFFERENTIAL - Abnormal; Notable for the following:    RBC 3.75 (*)    Hemoglobin 10.0 (*)    HCT 31.7 (*)    RDW 16.4 (*)    Platelets 465 (*)    All other components within normal limits  COMPREHENSIVE METABOLIC PANEL - Abnormal; Notable for the following:    Glucose, Bld 102 (*)    Alkaline Phosphatase 119 (*)    GFR calc non Af Amer 57 (*)    GFR calc Af Amer 66 (*)    All other components within normal limits    URINALYSIS, ROUTINE W REFLEX MICROSCOPIC   Imaging Review Ct Abdomen Pelvis W Contrast  10/02/2013   CLINICAL DATA:  Abdominal pain.  Low back pain.  EXAM: CT ABDOMEN AND PELVIS WITH CONTRAST  TECHNIQUE: Multidetector CT imaging of the abdomen and pelvis was performed using the standard protocol following bolus administration of intravenous contrast.  CONTRAST:  OMNIPAQUE IOHEXOL 300 MG/ML  SOLN  COMPARISON:  Chest and abdomen radiographs dated 09/28/2013.  FINDINGS: Multiple small bulla at the lung bases. Small amount of dependent atelectasis or scarring at both lung bases. Minimal diffuse low density of the liver relative to the spleen. Two small left lobe liver cysts. Tiny left renal cyst.  Unremarkable spleen, pancreas, gallbladder, adrenal glands, right kidney and urinary bladder. Multiple poorly defined uterine masses. No adnexal masses or enlarged lymph nodes. No gastrointestinal abnormalities. Normal appearing appendix. Lumbar and lower thoracic spine degenerative changes. Straightening of the normal lumbar lordosis.  IMPRESSION: 1. No acute abnormality. 2. Minimal hepatic steatosis. 3. Mild bibasilar atelectasis or scarring. 4. Mild changes of COPD.   Electronically Signed   By: Gordan Payment M.D.   On: 10/02/2013 21:43    EKG Interpretation   None       MDM   1. GI bleed   2. Low back pain     Patient was concerned that the low back pain and positive occult stool test were related. She felt as though her pain was  Now moving towards the front into her abdomen. Therefore CT scan of abdomen and pelvis was ordered.  CT scan does now show findings consistent with acute abdomen. Her hemoglobin has mildly dropped from  10.2 to 10.0 in one week. Her pain was managed in the ER and the patient was ready to go home.  I encouraged patient to call GI doctor back to discuss payment options so that she can have her positive stool test evaluated further.  52 y.o.Mariah Foster's evaluation  in the Emergency Department is complete. It has been determined that no acute conditions requiring further emergency intervention are present at this time. The patient/guardian have been advised of the diagnosis and plan. We have discussed signs and symptoms that warrant return to the ED, such as changes or worsening in symptoms.  Vital signs are stable at discharge. Filed Vitals:   10/02/13 2245  BP: 166/93  Pulse: 70  Temp:   Resp:     Patient/guardian has voiced understanding and agreed to follow-up with the PCP or specialist.    Dorthula Matas, PA-C 10/02/13 2338

## 2014-04-20 ENCOUNTER — Other Ambulatory Visit (HOSPITAL_COMMUNITY): Payer: Self-pay | Admitting: Physician Assistant

## 2014-04-20 ENCOUNTER — Other Ambulatory Visit (HOSPITAL_COMMUNITY): Payer: Self-pay | Admitting: *Deleted

## 2014-04-20 DIAGNOSIS — Z1231 Encounter for screening mammogram for malignant neoplasm of breast: Secondary | ICD-10-CM

## 2014-05-21 ENCOUNTER — Ambulatory Visit (HOSPITAL_COMMUNITY)
Admission: RE | Admit: 2014-05-21 | Discharge: 2014-05-21 | Disposition: A | Discharge status: Home / Self Care | Payer: BC Managed Care – PPO | Source: Ambulatory Visit | Attending: Internal Medicine | Admitting: Internal Medicine

## 2014-05-21 DIAGNOSIS — Z1231 Encounter for screening mammogram for malignant neoplasm of breast: Secondary | ICD-10-CM | POA: Insufficient documentation

## 2014-09-18 ENCOUNTER — Encounter (HOSPITAL_COMMUNITY): Payer: Self-pay | Admitting: Emergency Medicine

## 2014-09-18 ENCOUNTER — Emergency Department (HOSPITAL_COMMUNITY)
Admission: EM | Admit: 2014-09-18 | Discharge: 2014-09-19 | Disposition: A | Discharge status: Home / Self Care | Payer: BC Managed Care – PPO | Attending: Emergency Medicine | Admitting: Emergency Medicine

## 2014-09-18 DIAGNOSIS — N289 Disorder of kidney and ureter, unspecified: Secondary | ICD-10-CM | POA: Insufficient documentation

## 2014-09-18 DIAGNOSIS — Z79899 Other long term (current) drug therapy: Secondary | ICD-10-CM | POA: Insufficient documentation

## 2014-09-18 DIAGNOSIS — Z8719 Personal history of other diseases of the digestive system: Secondary | ICD-10-CM | POA: Diagnosis not present

## 2014-09-18 DIAGNOSIS — Z8639 Personal history of other endocrine, nutritional and metabolic disease: Secondary | ICD-10-CM | POA: Insufficient documentation

## 2014-09-18 DIAGNOSIS — R1011 Right upper quadrant pain: Secondary | ICD-10-CM | POA: Diagnosis not present

## 2014-09-18 DIAGNOSIS — R1031 Right lower quadrant pain: Secondary | ICD-10-CM | POA: Diagnosis present

## 2014-09-18 DIAGNOSIS — I10 Essential (primary) hypertension: Secondary | ICD-10-CM | POA: Diagnosis not present

## 2014-09-18 DIAGNOSIS — Z87891 Personal history of nicotine dependence: Secondary | ICD-10-CM | POA: Diagnosis not present

## 2014-09-18 LAB — URINALYSIS, ROUTINE W REFLEX MICROSCOPIC
BILIRUBIN URINE: NEGATIVE
GLUCOSE, UA: NEGATIVE mg/dL
Hgb urine dipstick: NEGATIVE
Ketones, ur: NEGATIVE mg/dL
Leukocytes, UA: NEGATIVE
Nitrite: NEGATIVE
PROTEIN: NEGATIVE mg/dL
Specific Gravity, Urine: 1.022 (ref 1.005–1.030)
UROBILINOGEN UA: 1 mg/dL (ref 0.0–1.0)
pH: 6.5 (ref 5.0–8.0)

## 2014-09-18 LAB — COMPREHENSIVE METABOLIC PANEL
ALK PHOS: 114 U/L (ref 39–117)
ALT: 18 U/L (ref 0–35)
AST: 19 U/L (ref 0–37)
Albumin: 4.1 g/dL (ref 3.5–5.2)
Anion gap: 15 (ref 5–15)
BILIRUBIN TOTAL: 0.4 mg/dL (ref 0.3–1.2)
BUN: 27 mg/dL — ABNORMAL HIGH (ref 6–23)
CHLORIDE: 101 meq/L (ref 96–112)
CO2: 25 meq/L (ref 19–32)
Calcium: 9.3 mg/dL (ref 8.4–10.5)
Creatinine, Ser: 1.24 mg/dL — ABNORMAL HIGH (ref 0.50–1.10)
GFR calc non Af Amer: 48 mL/min — ABNORMAL LOW (ref >90)
GFR, EST AFRICAN AMERICAN: 56 mL/min — AB (ref >90)
GLUCOSE: 97 mg/dL (ref 70–99)
POTASSIUM: 4.2 meq/L (ref 3.7–5.3)
SODIUM: 141 meq/L (ref 137–147)
Total Protein: 7.7 g/dL (ref 6.0–8.3)

## 2014-09-18 LAB — CBC WITH DIFFERENTIAL/PLATELET
Basophils Absolute: 0 10*3/uL (ref 0.0–0.1)
Basophils Relative: 0 % (ref 0–1)
EOS PCT: 2 % (ref 0–5)
Eosinophils Absolute: 0.2 10*3/uL (ref 0.0–0.7)
HCT: 38.7 % (ref 36.0–46.0)
Hemoglobin: 12.5 g/dL (ref 12.0–15.0)
LYMPHS ABS: 3.8 10*3/uL (ref 0.7–4.0)
Lymphocytes Relative: 46 % (ref 12–46)
MCH: 29.7 pg (ref 26.0–34.0)
MCHC: 32.3 g/dL (ref 30.0–36.0)
MCV: 91.9 fL (ref 78.0–100.0)
Monocytes Absolute: 0.6 10*3/uL (ref 0.1–1.0)
Monocytes Relative: 8 % (ref 3–12)
NEUTROS PCT: 44 % (ref 43–77)
Neutro Abs: 3.6 10*3/uL (ref 1.7–7.7)
Platelets: 386 10*3/uL (ref 150–400)
RBC: 4.21 MIL/uL (ref 3.87–5.11)
RDW: 13.6 % (ref 11.5–15.5)
WBC: 8.2 10*3/uL (ref 4.0–10.5)

## 2014-09-18 MED ORDER — SODIUM CHLORIDE 0.9 % IV BOLUS (SEPSIS)
1000.0000 mL | Freq: Once | INTRAVENOUS | Status: AC
  Administered 2014-09-18: 1000 mL via INTRAVENOUS

## 2014-09-18 MED ORDER — ONDANSETRON HCL 4 MG/2ML IJ SOLN
4.0000 mg | Freq: Once | INTRAMUSCULAR | Status: AC
  Administered 2014-09-18: 4 mg via INTRAVENOUS
  Filled 2014-09-18: qty 2

## 2014-09-18 MED ORDER — MORPHINE SULFATE 4 MG/ML IJ SOLN
4.0000 mg | Freq: Once | INTRAMUSCULAR | Status: AC
  Administered 2014-09-18: 4 mg via INTRAVENOUS
  Filled 2014-09-18: qty 1

## 2014-09-18 NOTE — ED Notes (Signed)
Pt. reports low abdominal pain with swellling onset today , denies nausea or vomitting . No fever or chills.

## 2014-09-18 NOTE — ED Notes (Signed)
Lab called to add on Lipase level.

## 2014-09-18 NOTE — ED Provider Notes (Signed)
CSN: [id number]     Arrival date & time 09/18/14  2216 History  This chart was scribed for Wandra Arthurs, MD by Evelene Croon, ED Scribe. This patient was seen in room B16C/B16C and the patient's care was started 11:28 PM.      Chief Complaint  Patient presents with  . Abdominal Pain    The history is provided by the patient. No language interpreter was used.     HPI Comments:  Mariah Foster is a 53 y.o. female who presents to the Emergency Department complaining of moderate pain and swelling to her right groin area that started today. She denies nausea, vomiting, diarrhea, dysuria and fever. She has a h/o of abdominal surgery, states she has had a C-section. She also reports a h/o abdominal hernia. No alleviating factors noted.   Past Medical History  Diagnosis Date  . Hypertension   . Hyperlipidemia   . Thyroid disease   . GI bleed    History reviewed. No pertinent past surgical history. No family history on file. History  Substance Use Topics  . Smoking status: Former Smoker    Quit date: 06/28/2011  . Smokeless tobacco: Never Used  . Alcohol Use: No   OB History    No data available     Review of Systems  Constitutional: Negative for fever.  Gastrointestinal: Positive for abdominal pain. Negative for nausea, vomiting and diarrhea.  All other systems reviewed and are negative.     Allergies  Metronidazole hcl  Home Medications   Prior to Admission medications   Medication Sig Start Date End Date Taking? Authorizing Provider  cyclobenzaprine (FLEXERIL) 5 MG tablet Take 1 tablet (5 mg total) by mouth 3 (three) times daily as needed for muscle spasms. 09/28/13   Dorie Rank, MD  hydrochlorothiazide (HYDRODIURIL) 25 MG tablet Take 25 mg by mouth daily.    Historical Provider, MD  HYDROcodone-acetaminophen (NORCO/VICODIN) 5-325 MG per tablet Take 1-2 tablets by mouth every 4 (four) hours as needed. 09/28/13   Dorie Rank, MD  lisinopril (PRINIVIL,ZESTRIL) 20 MG tablet  Take 40 mg by mouth daily.    Historical Provider, MD  oxyCODONE-acetaminophen (PERCOCET/ROXICET) 5-325 MG per tablet Take 1-2 tablets by mouth every 6 (six) hours as needed for severe pain. 10/02/13   Tiffany Marilu Favre, PA-C  pantoprazole (PROTONIX) 20 MG tablet Take 1 tablet (20 mg total) by mouth 2 (two) times daily. 09/28/13   Dorie Rank, MD   BP 158/95 mmHg  Pulse 68  Temp(Src) 98.9 F (37.2 C) (Oral)  Resp 16  Ht 5\' 4"  (1.626 m)  Wt 139 lb (63.05 kg)  BMI 23.85 kg/m2  SpO2 98% Physical Exam  Constitutional: She is oriented to person, place, and time. She appears well-developed and well-nourished.  HENT:  Head: Normocephalic and atraumatic.  Mouth/Throat: Oropharynx is clear and moist.  Eyes: Conjunctivae are normal.  Neck: No tracheal deviation present.  Cardiovascular: Normal rate, regular rhythm and normal heart sounds.   Pulmonary/Chest: Effort normal and breath sounds normal.  Abdominal: There is tenderness. There is rebound.  Tenderness to palpation to RUQ and RLQ with mild rebound in RLQ No obvious hernia.  Neurological: She is alert and oriented to person, place, and time.  Skin: Skin is warm and dry.  Psychiatric: She has a normal mood and affect.  Nursing note and vitals reviewed.   ED Course  Procedures   DIAGNOSTIC STUDIES:  Oxygen Saturation is 98% on RA, normal by my interpretation.  COORDINATION OF CARE:  11:31 PM Discussed treatment plan with pt at bedside and pt agreed to plan.  Labs Review Labs Reviewed  COMPREHENSIVE METABOLIC PANEL - Abnormal; Notable for the following:    BUN 27 (*)    Creatinine, Ser 1.24 (*)    GFR calc non Af Amer 48 (*)    GFR calc Af Amer 56 (*)    All other components within normal limits  CBC WITH DIFFERENTIAL  URINALYSIS, ROUTINE W REFLEX MICROSCOPIC  LIPASE, BLOOD    Imaging Review Ct Abdomen Pelvis W Contrast  09/19/2014   CLINICAL DATA:  Right lower quadrant abdominal pain starting tonight.  EXAM: CT ABDOMEN  AND PELVIS WITH CONTRAST  TECHNIQUE: Multidetector CT imaging of the abdomen and pelvis was performed using the standard protocol following bolus administration of intravenous contrast.  CONTRAST:  13mL OMNIPAQUE IOHEXOL 300 MG/ML  SOLN  COMPARISON:  10/02/2013  FINDINGS: Atelectasis in the lung bases.  Mild diffuse fatty infiltration in the liver. Small cysts in the left lobe of the liver. The gallbladder, spleen, pancreas, kidneys, inferior vena cava, and retroperitoneal lymph nodes are unremarkable. Calcification of the abdominal aorta without aneurysm. Small nodule on the left adrenal gland measuring 10 mm diameter. No change since prior study. Stomach and small bowel are normal in appearance. Stool-filled colon without distention. No free air or free fluid in the abdomen.  Pelvis: The of the appendix is normal. Bladder wall is not thickened. Uterus is not enlarged. No free or loculated pelvic fluid collections. No pelvic mass or lymphadenopathy. Degenerative changes in the lumbar spine. No destructive bone lesions.  IMPRESSION: No acute process demonstrated in the abdomen or pelvis. Appendix is normal. Mild diffuse fatty infiltration of the liver. Small left adrenal gland nodule is stable.   Electronically Signed   By: Lucienne Capers M.D.   On: 09/19/2014 01:38     EKG Interpretation None      MDM   Final diagnoses:  RLQ abdominal pain    Jaylin A Burnett Harry is a 53 y.o. female here with RLQ pain and RUQ pain. Consider chole vs appy. More tender in RLQ. Will get CT ab/pel to assess.   1:45 AM Labs showed Cr 1.2, baseline around 0.9, BUN slightly elevated. Denies GI bleed. Likely dehydration. CT showed no acute process. Hypertensive in the ED, improved with pain meds. I don't know why she has abdominal pain. Will give short course of pain meds. Will have her get BMP in a week and recheck BP in a week.     I personally performed the services described in this documentation, which was scribed in  my presence. The recorded information has been reviewed and is accurate.   Wandra Arthurs, MD 09/19/14 [phone]

## 2014-09-19 ENCOUNTER — Encounter (HOSPITAL_COMMUNITY): Payer: Self-pay | Admitting: Radiology

## 2014-09-19 ENCOUNTER — Emergency Department (HOSPITAL_COMMUNITY): Payer: BC Managed Care – PPO

## 2014-09-19 LAB — LIPASE, BLOOD: Lipase: 19 U/L (ref 11–59)

## 2014-09-19 MED ORDER — HYDROCODONE-ACETAMINOPHEN 5-325 MG PO TABS: 1.0000 | ORAL_TABLET | ORAL | Status: DC | PRN

## 2014-09-19 MED ORDER — IOHEXOL 300 MG/ML  SOLN
25.0000 mL | Freq: Once | INTRAMUSCULAR | Status: AC | PRN
  Administered 2014-09-19: 25 mL via ORAL

## 2014-09-19 MED ORDER — IOHEXOL 300 MG/ML  SOLN
80.0000 mL | Freq: Once | INTRAMUSCULAR | Status: AC | PRN
  Administered 2014-09-19: 80 mL via INTRAVENOUS

## 2014-09-19 NOTE — Discharge Instructions (Signed)
Stay hydrated.   Take norco as needed for pain. Do NOT drive with it.   Your kidney function is slightly abnormal likely because you are dehydrated. Get repeat kidney function test in a week.   Your blood pressure is elevated. Check with your doctor in a week.   Return to ER if you have fever, severe pain, vomiting.

## 2014-09-19 NOTE — ED Notes (Signed)
Notified CT that patient is ready for scan.  

## 2014-09-19 NOTE — ED Notes (Signed)
Pt. Refused wheelchair 

## 2014-11-14 ENCOUNTER — Encounter (HOSPITAL_COMMUNITY): Payer: Self-pay | Admitting: *Deleted

## 2014-11-14 ENCOUNTER — Emergency Department (HOSPITAL_COMMUNITY): Payer: 59

## 2014-11-14 ENCOUNTER — Emergency Department (HOSPITAL_COMMUNITY)
Admission: EM | Admit: 2014-11-14 | Discharge: 2014-11-14 | Disposition: A | Discharge status: Home / Self Care | Payer: 59 | Attending: Emergency Medicine | Admitting: Emergency Medicine

## 2014-11-14 DIAGNOSIS — Z87891 Personal history of nicotine dependence: Secondary | ICD-10-CM | POA: Diagnosis not present

## 2014-11-14 DIAGNOSIS — R1032 Left lower quadrant pain: Secondary | ICD-10-CM

## 2014-11-14 DIAGNOSIS — M549 Dorsalgia, unspecified: Secondary | ICD-10-CM | POA: Diagnosis not present

## 2014-11-14 DIAGNOSIS — Z8719 Personal history of other diseases of the digestive system: Secondary | ICD-10-CM | POA: Insufficient documentation

## 2014-11-14 DIAGNOSIS — R109 Unspecified abdominal pain: Secondary | ICD-10-CM | POA: Diagnosis present

## 2014-11-14 DIAGNOSIS — Z8639 Personal history of other endocrine, nutritional and metabolic disease: Secondary | ICD-10-CM | POA: Diagnosis not present

## 2014-11-14 DIAGNOSIS — Z79899 Other long term (current) drug therapy: Secondary | ICD-10-CM | POA: Diagnosis not present

## 2014-11-14 DIAGNOSIS — I10 Essential (primary) hypertension: Secondary | ICD-10-CM | POA: Insufficient documentation

## 2014-11-14 DIAGNOSIS — R1012 Left upper quadrant pain: Secondary | ICD-10-CM

## 2014-11-14 LAB — URINALYSIS, ROUTINE W REFLEX MICROSCOPIC
Bilirubin Urine: NEGATIVE
Glucose, UA: NEGATIVE mg/dL
Hgb urine dipstick: NEGATIVE
Ketones, ur: NEGATIVE mg/dL
Leukocytes, UA: NEGATIVE
Nitrite: NEGATIVE
Protein, ur: NEGATIVE mg/dL
Specific Gravity, Urine: 1.027 (ref 1.005–1.030)
Urobilinogen, UA: 0.2 mg/dL (ref 0.0–1.0)
pH: 6 (ref 5.0–8.0)

## 2014-11-14 LAB — CBC WITH DIFFERENTIAL/PLATELET
Basophils Absolute: 0 10*3/uL (ref 0.0–0.1)
Basophils Relative: 0 % (ref 0–1)
Eosinophils Absolute: 0.2 10*3/uL (ref 0.0–0.7)
Eosinophils Relative: 2 % (ref 0–5)
HCT: 36.5 % (ref 36.0–46.0)
Hemoglobin: 11.7 g/dL — ABNORMAL LOW (ref 12.0–15.0)
Lymphocytes Relative: 31 % (ref 12–46)
Lymphs Abs: 2.5 10*3/uL (ref 0.7–4.0)
MCH: 28.7 pg (ref 26.0–34.0)
MCHC: 32.1 g/dL (ref 30.0–36.0)
MCV: 89.5 fL (ref 78.0–100.0)
Monocytes Absolute: 0.8 10*3/uL (ref 0.1–1.0)
Monocytes Relative: 10 % (ref 3–12)
Neutro Abs: 4.6 10*3/uL (ref 1.7–7.7)
Neutrophils Relative %: 57 % (ref 43–77)
Platelets: 386 10*3/uL (ref 150–400)
RBC: 4.08 MIL/uL (ref 3.87–5.11)
RDW: 13.8 % (ref 11.5–15.5)
WBC: 8.1 10*3/uL (ref 4.0–10.5)

## 2014-11-14 LAB — COMPREHENSIVE METABOLIC PANEL
ALT: 19 U/L (ref 0–35)
AST: 20 U/L (ref 0–37)
Albumin: 3.8 g/dL (ref 3.5–5.2)
Alkaline Phosphatase: 108 U/L (ref 39–117)
Anion gap: 10 (ref 5–15)
BUN: 23 mg/dL (ref 6–23)
CO2: 26 mmol/L (ref 19–32)
Calcium: 9.2 mg/dL (ref 8.4–10.5)
Chloride: 104 mEq/L (ref 96–112)
Creatinine, Ser: 1.25 mg/dL — ABNORMAL HIGH (ref 0.50–1.10)
GFR calc Af Amer: 55 mL/min — ABNORMAL LOW (ref >90)
GFR calc non Af Amer: 48 mL/min — ABNORMAL LOW (ref >90)
Glucose, Bld: 111 mg/dL — ABNORMAL HIGH (ref 70–99)
Potassium: 3.5 mmol/L (ref 3.5–5.1)
Sodium: 140 mmol/L (ref 135–145)
Total Bilirubin: 0.8 mg/dL (ref 0.3–1.2)
Total Protein: 7 g/dL (ref 6.0–8.3)

## 2014-11-14 LAB — LIPASE, BLOOD: Lipase: 24 U/L (ref 11–59)

## 2014-11-14 MED ORDER — HYDROCODONE-ACETAMINOPHEN 5-325 MG PO TABS: 1.0000 | ORAL_TABLET | Freq: Four times a day (QID) | ORAL | Status: DC | PRN

## 2014-11-14 MED ORDER — IOHEXOL 300 MG/ML  SOLN
100.0000 mL | Freq: Once | INTRAMUSCULAR | Status: AC | PRN
  Administered 2014-11-14: 80 mL via INTRAVENOUS

## 2014-11-14 MED ORDER — SODIUM CHLORIDE 0.9 % IV BOLUS (SEPSIS)
1000.0000 mL | Freq: Once | INTRAVENOUS | Status: AC
  Administered 2014-11-14: 1000 mL via INTRAVENOUS

## 2014-11-14 MED ORDER — HYDROMORPHONE HCL 1 MG/ML IJ SOLN
1.0000 mg | Freq: Once | INTRAMUSCULAR | Status: AC
  Administered 2014-11-14: 1 mg via INTRAVENOUS
  Filled 2014-11-14: qty 1

## 2014-11-14 MED ORDER — ONDANSETRON HCL 4 MG/2ML IJ SOLN
4.0000 mg | Freq: Once | INTRAMUSCULAR | Status: AC
  Administered 2014-11-14: 4 mg via INTRAVENOUS
  Filled 2014-11-14: qty 2

## 2014-11-14 NOTE — ED Provider Notes (Signed)
CSN: [id number]     Arrival date & time 11/14/14  1033 History   First MD Initiated Contact with Patient 11/14/14 1041     Chief Complaint  Patient presents with  . Abdominal Pain  . Back Pain     (Consider location/radiation/quality/duration/timing/severity/associated sxs/prior Treatment) HPI Patient presents to the emergency department with left flank pain that radiates around to her abdomen and pain with urination.  The patient states that this started several days ago she has had ongoing abdominal and back problems over the last few months.  The patient states that she has not had any chest pain, shortness of breath, nausea, vomiting, diarrhea, weakness, dizziness, headache, blurred vision, neck pain, fever, cough, runny nose, sore throat, rash, or syncope.  Patient states that she did not take any other medications other than her prescribed medicines prior to arrival.  Patient states she has only seen her primary care doctor and no GI specialist recently.  Patient states nothing seems make her condition better.  Palpation makes the pain worse Past Medical History  Diagnosis Date  . Hypertension   . Hyperlipidemia   . Thyroid disease   . GI bleed    History reviewed. No pertinent past surgical history. History reviewed. No pertinent family history. History  Substance Use Topics  . Smoking status: Former Smoker    Quit date: 06/28/2011  . Smokeless tobacco: Never Used  . Alcohol Use: No   OB History    No data available     Review of Systems All other systems negative except as documented in the HPI. All pertinent positives and negatives as reviewed in the HPI.=   Allergies  Metronidazole hcl  Home Medications   Prior to Admission medications   Medication Sig Start Date End Date Taking? Authorizing Provider  lisinopril (PRINIVIL,ZESTRIL) 30 MG tablet Take 30 mg by mouth daily.   Yes Historical Provider, MD  cyclobenzaprine (FLEXERIL) 5 MG tablet Take 1 tablet (5 mg  total) by mouth 3 (three) times daily as needed for muscle spasms. Patient not taking: Reported on 11/14/2014 09/28/13   Dorie Rank, MD  HYDROcodone-acetaminophen (NORCO/VICODIN) 5-325 MG per tablet Take 1-2 tablets by mouth every 4 (four) hours as needed. Patient not taking: Reported on 11/14/2014 09/19/14   Wandra Arthurs, MD  oxyCODONE-acetaminophen (PERCOCET/ROXICET) 5-325 MG per tablet Take 1-2 tablets by mouth every 6 (six) hours as needed for severe pain. Patient not taking: Reported on 11/14/2014 10/02/13   Linus Mako, PA-C  pantoprazole (PROTONIX) 20 MG tablet Take 1 tablet (20 mg total) by mouth 2 (two) times daily. Patient not taking: Reported on 11/14/2014 09/28/13   Dorie Rank, MD   BP 169/97 mmHg  Pulse 62  Temp(Src) 98.4 F (36.9 C) (Oral)  Resp 18  SpO2 96% Physical Exam  Constitutional: She is oriented to person, place, and time. She appears well-developed and well-nourished. No distress.  HENT:  Head: Normocephalic and atraumatic.  Mouth/Throat: Oropharynx is clear and moist.  Eyes: Pupils are equal, round, and reactive to light.  Neck: Normal range of motion. Neck supple.  Cardiovascular: Normal rate, regular rhythm and normal heart sounds.  Exam reveals no gallop and no friction rub.   No murmur heard. Pulmonary/Chest: Effort normal and breath sounds normal. No respiratory distress.  Abdominal: Soft. Bowel sounds are normal. She exhibits no distension. There is tenderness. There is no rebound and no guarding.  Musculoskeletal: She exhibits no edema.  Neurological: She is alert and oriented to person, place,  and time. She exhibits normal muscle tone. Coordination normal.  Skin: Skin is warm and dry. No rash noted. No erythema.  Nursing note and vitals reviewed.   ED Course  Procedures (including critical care time) Labs Review Labs Reviewed  COMPREHENSIVE METABOLIC PANEL - Abnormal; Notable for the following:    Glucose, Bld 111 (*)    Creatinine, Ser 1.25 (*)     GFR calc non Af Amer 48 (*)    GFR calc Af Amer 55 (*)    All other components within normal limits  CBC WITH DIFFERENTIAL - Abnormal; Notable for the following:    Hemoglobin 11.7 (*)    All other components within normal limits  URINE CULTURE  URINALYSIS, ROUTINE W REFLEX MICROSCOPIC  LIPASE, BLOOD    Imaging Review Ct Abdomen Pelvis W Contrast  11/14/2014   CLINICAL DATA:  Severe left side abdominal and left flank pain for several weeks. Pain with urination.  EXAM: CT ABDOMEN AND PELVIS WITH CONTRAST  TECHNIQUE: Multidetector CT imaging of the abdomen and pelvis was performed using the standard protocol following bolus administration of intravenous contrast.  CONTRAST:  80 mL OMNIPAQUE IOHEXOL 300 MG/ML  SOLN  COMPARISON:  CT abdomen and pelvis 09/19/2014.  FINDINGS: Dependent atelectasis is seen in the lung bases. No pleural or pericardial effusion.  A 1 cm in diameter hypo attenuating lesion in left hepatic lobe on image 14 compatible with a cyst is unchanged. The liver otherwise appears normal. The gallbladder, spleen, pancreas, right adrenal gland and biliary tree are all unremarkable. Tiny left adrenal nodule likely representing a benign adenoma is unchanged. The kidneys appear normal bilaterally. Aortoiliac atherosclerosis without aneurysm is seen.  The uterus, adnexa and urinary bladder are unremarkable. The stomach, small and large bowel and appendix appear normal. There is no lymphadenopathy or fluid.  No lytic or sclerotic bony lesion is identified. Degenerative disc disease is seen at L4-5 and L5-S1.  IMPRESSION: No acute finding abdomen or pelvis. No abnormality to explain the patient's symptoms.  Aortoiliac atherosclerosis.   Electronically Signed   By: Inge Rise M.D.   On: 11/14/2014 14:22   A shunt has no acute findings noted on her CT scan.  Her laboratory testing and urinalysis do not show any significant abnormality.  The patient will be referred back to her primary care  doctor and GI.  She is told to return here for any worsening in her condition.  Told to slowly increase her fluid intake  MDM   Final diagnoses:  None        Brent General, PA-C 11/14/14 [place], MD 11/25/14 [phone]

## 2014-11-14 NOTE — Discharge Instructions (Signed)
Return here as needed.  Follow-up with the GI Dr. provided.  Your x-rays did not show any abnormality

## 2014-11-14 NOTE — ED Notes (Signed)
Pt reports having abd pain that radiates around to left flank and has pain with urination.

## 2014-11-14 NOTE — ED Notes (Signed)
Patient transported to CT 

## 2014-11-14 NOTE — ED Notes (Signed)
Notified CT done with PO contrast.

## 2014-11-14 NOTE — ED Notes (Signed)
Patient returned from CT

## 2014-11-15 LAB — URINE CULTURE
Colony Count: NO GROWTH
Culture: NO GROWTH

## 2015-01-28 ENCOUNTER — Encounter (HOSPITAL_COMMUNITY): Payer: Self-pay | Admitting: *Deleted

## 2015-01-28 ENCOUNTER — Emergency Department (HOSPITAL_COMMUNITY): Payer: 59

## 2015-01-28 ENCOUNTER — Emergency Department (HOSPITAL_COMMUNITY)
Admission: EM | Admit: 2015-01-28 | Discharge: 2015-01-28 | Disposition: A | Discharge status: Home / Self Care | Payer: 59 | Attending: Emergency Medicine | Admitting: Emergency Medicine

## 2015-01-28 DIAGNOSIS — Z8639 Personal history of other endocrine, nutritional and metabolic disease: Secondary | ICD-10-CM | POA: Insufficient documentation

## 2015-01-28 DIAGNOSIS — R05 Cough: Secondary | ICD-10-CM

## 2015-01-28 DIAGNOSIS — Z87891 Personal history of nicotine dependence: Secondary | ICD-10-CM | POA: Diagnosis not present

## 2015-01-28 DIAGNOSIS — Z79899 Other long term (current) drug therapy: Secondary | ICD-10-CM | POA: Diagnosis not present

## 2015-01-28 DIAGNOSIS — J069 Acute upper respiratory infection, unspecified: Secondary | ICD-10-CM | POA: Diagnosis not present

## 2015-01-28 DIAGNOSIS — I10 Essential (primary) hypertension: Secondary | ICD-10-CM | POA: Diagnosis not present

## 2015-01-28 DIAGNOSIS — Z8739 Personal history of other diseases of the musculoskeletal system and connective tissue: Secondary | ICD-10-CM | POA: Insufficient documentation

## 2015-01-28 DIAGNOSIS — R059 Cough, unspecified: Secondary | ICD-10-CM

## 2015-01-28 LAB — RAPID STREP SCREEN (MED CTR MEBANE ONLY): Streptococcus, Group A Screen (Direct): NEGATIVE

## 2015-01-28 MED ORDER — HYDROCODONE-ACETAMINOPHEN 7.5-325 MG/15ML PO SOLN
10.0000 mL | Freq: Once | ORAL | Status: AC
  Administered 2015-01-28: 10 mL via ORAL
  Filled 2015-01-28: qty 15

## 2015-01-28 MED ORDER — GUAIFENESIN-CODEINE 100-10 MG/5ML PO SOLN: 5.0000 mL | Freq: Every evening | ORAL | Status: DC | PRN

## 2015-01-28 MED ORDER — AMOXICILLIN 500 MG PO CAPS: 500.0000 mg | ORAL_CAPSULE | Freq: Three times a day (TID) | ORAL | Status: DC

## 2015-01-28 MED ORDER — LIDOCAINE VISCOUS 2 % MT SOLN
15.0000 mL | Freq: Once | OROMUCOSAL | Status: AC
  Administered 2015-01-28: 15 mL via OROMUCOSAL
  Filled 2015-01-28: qty 15

## 2015-01-28 MED ORDER — AMOXICILLIN 500 MG PO CAPS
500.0000 mg | ORAL_CAPSULE | Freq: Once | ORAL | Status: AC
  Administered 2015-01-28: 500 mg via ORAL
  Filled 2015-01-28: qty 1

## 2015-01-28 NOTE — ED Notes (Signed)
Patient reports sore throat times 2 weeks with cough, denies fever.

## 2015-01-28 NOTE — Discharge Instructions (Signed)
Upper Respiratory Infection, Adult °An upper respiratory infection (URI) is also sometimes known as the common cold. The upper respiratory tract includes the nose, sinuses, throat, trachea, and bronchi. Bronchi are the airways leading to the lungs. Most people improve within 1 week, but symptoms can last up to 2 weeks. A residual cough may last even longer.  °CAUSES °Many different viruses can infect the tissues lining the upper respiratory tract. The tissues become irritated and inflamed and often become very moist. Mucus production is also common. A cold is contagious. You can easily spread the virus to others by oral contact. This includes kissing, sharing a glass, coughing, or sneezing. Touching your mouth or nose and then touching a surface, which is then touched by another person, can also spread the virus. °SYMPTOMS  °Symptoms typically develop 1 to 3 days after you come in contact with a cold virus. Symptoms vary from person to person. They may include: °· Runny nose. °· Sneezing. °· Nasal congestion. °· Sinus irritation. °· Sore throat. °· Loss of voice (laryngitis). °· Cough. °· Fatigue. °· Muscle aches. °· Loss of appetite. °· Headache. °· Low-grade fever. °DIAGNOSIS  °You might diagnose your own cold based on familiar symptoms, since most people get a cold 2 to 3 times a year. Your caregiver can confirm this based on your exam. Most importantly, your caregiver can check that your symptoms are not due to another disease such as strep throat, sinusitis, pneumonia, asthma, or epiglottitis. Blood tests, throat tests, and X-rays are not necessary to diagnose a common cold, but they may sometimes be helpful in excluding other more serious diseases. Your caregiver will decide if any further tests are required. °RISKS AND COMPLICATIONS  °You may be at risk for a more severe case of the common cold if you smoke cigarettes, have chronic heart disease (such as heart failure) or lung disease (such as asthma), or if  you have a weakened immune system. The very young and very old are also at risk for more serious infections. Bacterial sinusitis, middle ear infections, and bacterial pneumonia can complicate the common cold. The common cold can worsen asthma and chronic obstructive pulmonary disease (COPD). Sometimes, these complications can require emergency medical care and may be life-threatening. °PREVENTION  °The best way to protect against getting a cold is to practice good hygiene. Avoid oral or hand contact with people with cold symptoms. Wash your hands often if contact occurs. There is no clear evidence that vitamin C, vitamin E, echinacea, or exercise reduces the chance of developing a cold. However, it is always recommended to get plenty of rest and practice good nutrition. °TREATMENT  °Treatment is directed at relieving symptoms. There is no cure. Antibiotics are not effective, because the infection is caused by a virus, not by bacteria. Treatment may include: °· Increased fluid intake. Sports drinks offer valuable electrolytes, sugars, and fluids. °· Breathing heated mist or steam (vaporizer or shower). °· Eating chicken soup or other clear broths, and maintaining good nutrition. °· Getting plenty of rest. °· Using gargles or lozenges for comfort. °· Controlling fevers with ibuprofen or acetaminophen as directed by your caregiver. °· Increasing usage of your inhaler if you have asthma. °Zinc gel and zinc lozenges, taken in the first 24 hours of the common cold, can shorten the duration and lessen the severity of symptoms. Pain medicines may help with fever, muscle aches, and throat pain. A variety of non-prescription medicines are available to treat congestion and runny nose. Your caregiver   can make recommendations and may suggest nasal or lung inhalers for other symptoms.  °HOME CARE INSTRUCTIONS  °· Only take over-the-counter or prescription medicines for pain, discomfort, or fever as directed by your  caregiver. °· Use a warm mist humidifier or inhale steam from a shower to increase air moisture. This may keep secretions moist and make it easier to breathe. °· Drink enough water and fluids to keep your urine clear or pale yellow. °· Rest as needed. °· Return to work when your temperature has returned to normal or as your caregiver advises. You may need to stay home longer to avoid infecting others. You can also use a face mask and careful hand washing to prevent spread of the virus. °SEEK MEDICAL CARE IF:  °· After the first few days, you feel you are getting worse rather than better. °· You need your caregiver's advice about medicines to control symptoms. °· You develop chills, worsening shortness of breath, or brown or red sputum. These may be signs of pneumonia. °· You develop yellow or brown nasal discharge or pain in the face, especially when you bend forward. These may be signs of sinusitis. °· You develop a fever, swollen neck glands, pain with swallowing, or white areas in the back of your throat. These may be signs of strep throat. °SEEK IMMEDIATE MEDICAL CARE IF:  °· You have a fever. °· You develop severe or persistent headache, ear pain, sinus pain, or chest pain. °· You develop wheezing, a prolonged cough, cough up blood, or have a change in your usual mucus (if you have chronic lung disease). °· You develop sore muscles or a stiff neck. °Document Released: 04/10/2001 Document Revised: 01/07/2012 Document Reviewed: 01/20/2014 °[employer]® Patient Information ©2015 [employer]. This information is not intended to replace advice given to you by your health care provider. Make sure you discuss any questions you have with your health care provider. ° ° °Pharyngitis °Pharyngitis is redness, pain, and swelling (inflammation) of your pharynx.  °CAUSES  °Pharyngitis is usually caused by infection. Most of the time, these infections are from viruses (viral) and are part of a cold. However, sometimes  pharyngitis is caused by bacteria (bacterial). Pharyngitis can also be caused by allergies. Viral pharyngitis may be spread from person to person by coughing, sneezing, and personal items or utensils (cups, forks, spoons, toothbrushes). Bacterial pharyngitis may be spread from person to person by more intimate contact, such as kissing.  °SIGNS AND SYMPTOMS  °Symptoms of pharyngitis include:   °· Sore throat.   °· Tiredness (fatigue).   °· Low-grade fever.   °· Headache. °· Joint pain and muscle aches. °· Skin rashes. °· Swollen lymph nodes. °· Plaque-like film on throat or tonsils (often seen with bacterial pharyngitis). °DIAGNOSIS  °Your health care provider will ask you questions about your illness and your symptoms. Your medical history, along with a physical exam, is often all that is needed to diagnose pharyngitis. Sometimes, a rapid strep test is done. Other lab tests may also be done, depending on the suspected cause.  °TREATMENT  °Viral pharyngitis will usually get better in 3-4 days without the use of medicine. Bacterial pharyngitis is treated with medicines that kill germs (antibiotics).  °HOME CARE INSTRUCTIONS  °· Drink enough water and fluids to keep your urine clear or pale yellow.   °· Only take over-the-counter or prescription medicines as directed by your health care provider:   °¨ If you are prescribed antibiotics, make sure you finish them even if you start to feel better.   °¨   Do not take aspirin.   °· Get lots of rest.   °· Gargle with 8 oz of salt water (½ tsp of salt per 1 qt of water) as often as every 1-2 hours to soothe your throat.   °· Throat lozenges (if you are not at risk for choking) or sprays may be used to soothe your throat. °SEEK MEDICAL CARE IF:  °· You have large, tender lumps in your neck. °· You have a rash. °· You cough up green, yellow-brown, or bloody spit. °SEEK IMMEDIATE MEDICAL CARE IF:  °· Your neck becomes stiff. °· You drool or are unable to swallow liquids. °· You  vomit or are unable to keep medicines or liquids down. °· You have severe pain that does not go away with the use of recommended medicines. °· You have trouble breathing (not caused by a stuffy nose). °MAKE SURE YOU:  °· Understand these instructions. °· Will watch your condition. °· Will get help right away if you are not doing well or get worse. °Document Released: 10/15/2005 Document Revised: 08/05/2013 Document Reviewed: 06/22/2013 °ExitCare® Patient Information ©2015 [employer]. This information is not intended to replace advice given to you by your health care provider. Make sure you discuss any questions you have with your health care provider. ° °

## 2015-01-28 NOTE — ED Provider Notes (Signed)
CSN: [id number]     Arrival date & time 01/28/15  1832 History   First MD Initiated Contact with Patient 01/28/15 1936     Chief Complaint  Patient presents with  . Sore Throat  . Cough   Patient is a 54 y.o. female presenting with pharyngitis and cough. The history is provided by the patient. No language interpreter was used.  Sore Throat  Cough  This chart was scribed for non-physician practitioner Delos Haring, PA-C, working with Ezequiel Essex, MD, by Thea Alken, ED Scribe. This patient was seen in room TR09C/TR09C and the patient's care was started at 9:23 PM.  Mariah Foster is a 54 y.o. female with hx of HTN who presents to the Emergency Department complaining of a worsening, right sided sore throat for the past 2 weeks. She reports associated non productive cough onset 2 weeks, pain with swallowing, a mild HA, and some right otalgia. She reports worsening sore throat with eating and drinking but rates sore throat 5/10 with rest. Pt has tried chloraseptic spray, gargling with warm salt water, and delsym. Pt reports she is unable to be seen by PCP due to PCP being in [place]. Pt denies fever, nausea, abdominal pain, and CP. Pt is a former smoker but states she smokes a cigarette every once in a while   Past Medical History  Diagnosis Date  . Hypertension   . Hyperlipidemia   . GI bleed    History reviewed. No pertinent past surgical history. History reviewed. No pertinent family history. History  Substance Use Topics  . Smoking status: Former Smoker    Quit date: 06/28/2011  . Smokeless tobacco: Never Used  . Alcohol Use: No   OB History    No data available     Review of Systems  Respiratory: Positive for cough.    Allergies  Metronidazole hcl  Home Medications   Prior to Admission medications   Medication Sig Start Date End Date Taking? Authorizing Provider  HYDROcodone-acetaminophen (NORCO/VICODIN) 5-325 MG per tablet Take 1 tablet by mouth every 6 (six)  hours as needed for moderate pain. 11/14/14  Yes Christopher Lawyer, PA-C  lisinopril (PRINIVIL,ZESTRIL) 30 MG tablet Take 30 mg by mouth daily.   Yes Historical Provider, MD  amoxicillin (AMOXIL) 500 MG capsule Take 1 capsule (500 mg total) by mouth 3 (three) times daily. 01/28/15   Rameses Ou Carlota Raspberry, PA-C  cyclobenzaprine (FLEXERIL) 5 MG tablet Take 1 tablet (5 mg total) by mouth 3 (three) times daily as needed for muscle spasms. Patient not taking: Reported on 11/14/2014 09/28/13   Dorie Rank, MD  guaiFENesin-codeine 100-10 MG/5ML syrup Take 5 mLs by mouth at bedtime as needed for cough. 01/28/15   Delos Haring, PA-C  oxyCODONE-acetaminophen (PERCOCET/ROXICET) 5-325 MG per tablet Take 1-2 tablets by mouth every 6 (six) hours as needed for severe pain. Patient not taking: Reported on 11/14/2014 10/02/13   Delos Haring, PA-C  pantoprazole (PROTONIX) 20 MG tablet Take 1 tablet (20 mg total) by mouth 2 (two) times daily. Patient not taking: Reported on 11/14/2014 09/28/13   Dorie Rank, MD   BP 171/90 mmHg  Pulse 71  Temp(Src) 98.3 F (36.8 C) (Oral)  Resp 16  Ht 5\' 6"  (1.676 m)  Wt 142 lb 9.6 oz (64.683 kg)  BMI 23.03 kg/m2  SpO2 98% Physical Exam  Constitutional: She is oriented to person, place, and time. She appears well-developed and well-nourished. No distress.  HENT:  Head: Normocephalic and atraumatic.  Right Ear: Tympanic membrane,  external ear and ear canal normal.  Left Ear: Tympanic membrane, external ear and ear canal normal.  Nose: Nose normal. No rhinorrhea. Right sinus exhibits no maxillary sinus tenderness and no frontal sinus tenderness. Left sinus exhibits no maxillary sinus tenderness and no frontal sinus tenderness.  Mouth/Throat: Uvula is midline and mucous membranes are normal. No trismus in the jaw. Normal dentition. No dental abscesses or uvula swelling. No oropharyngeal exudate, posterior oropharyngeal edema, posterior oropharyngeal erythema or tonsillar abscesses.  No  submental edema, tongue not elevated, no trismus. No impending airway obstruction; Pt able to speak full sentences, swallow intact, no drooling, stridor, or tonsillar/uvula displacement. No palatal petechia  Eyes: Conjunctivae and EOM are normal.  Neck: Trachea normal, normal range of motion and full passive range of motion without pain. Neck supple. No rigidity. Normal range of motion present. No Brudzinski's sign noted.  Flexion and extension of neck without pain or difficulty. Able to breath without difficulty in extension.  Cardiovascular: Normal rate and regular rhythm.   Pulmonary/Chest: Effort normal and breath sounds normal. No stridor. No respiratory distress. She has no wheezes.  Abdominal: Soft. There is no tenderness.  No obvious evidence of splenomegaly. Non ttp.   Musculoskeletal: Normal range of motion.  Lymphadenopathy:       Head (right side): No preauricular and no posterior auricular adenopathy present.       Head (left side): No preauricular and no posterior auricular adenopathy present.  Neurological: She is alert and oriented to person, place, and time.  Skin: Skin is warm and dry. No rash noted. She is not diaphoretic.  Psychiatric: She has a normal mood and affect. Her behavior is normal.  Nursing note and vitals reviewed.   ED Course  Procedures (including critical care time) DIAGNOSTIC STUDIES: Oxygen Saturation is 98% on RA, normal by my interpretation.    COORDINATION OF CARE: 9:23 PM- Pt advised of plan for treatment and pt agrees. Labs Review Labs Reviewed  RAPID STREP SCREEN  CULTURE, GROUP A STREP    Imaging Review Dg Chest 2 View  01/28/2015   CLINICAL DATA:  Sore throat x2 weeks, cough  EXAM: CHEST  2 VIEW  COMPARISON:  09/28/2013  FINDINGS: Lungs are essentially clear. No focal consolidation. No pleural effusion or pneumothorax.  The heart is normal in size.  Visualized osseous structures are within normal limits.  IMPRESSION: No evidence of acute  cardiopulmonary disease.   Electronically Signed   By: Julian Hy M.D.   On: 01/28/2015 21:13     EKG Interpretation None      MDM   Final diagnoses:  Cough  URI (upper respiratory infection)   Patients chest xray has returned showing no acute infection or abnormalities. She has lateralizing sore throat which puts her at risk for abscess. As of right now her physical exam does not show any significant findings. Pain for 2 weeks and worsening, will start on Amoxicillin and give decongestant/cough medication. She has PCP she needs to follow-up with. Pt looks nontoxic and has normal vital signs.  54 y.o.Mariah Foster's evaluation in the Emergency Department is complete. It has been determined that no acute conditions requiring further emergency intervention are present at this time. The patient/guardian have been advised of the diagnosis and plan. We have discussed signs and symptoms that warrant return to the ED, such as changes or worsening in symptoms.  Vital signs are stable at discharge. Filed Vitals:   01/28/15 1846  BP: 171/90  Pulse: 71  Temp: 98.3 F (36.8 C)  Resp: 16    Patient/guardian has voiced understanding and agreed to follow-up with the PCP or specialist.  I personally performed the services described in this documentation, which was scribed in my presence. The recorded information has been reviewed and is accurate.    Delos Haring, PA-C 01/28/15 2125  Ezequiel Essex, MD 01/29/15 [phone]

## 2015-01-31 LAB — CULTURE, GROUP A STREP: Strep A Culture: NEGATIVE

## 2015-04-20 ENCOUNTER — Other Ambulatory Visit (HOSPITAL_COMMUNITY): Payer: Self-pay | Admitting: Physician Assistant

## 2015-04-20 DIAGNOSIS — Z1231 Encounter for screening mammogram for malignant neoplasm of breast: Secondary | ICD-10-CM

## 2015-05-27 ENCOUNTER — Ambulatory Visit (HOSPITAL_COMMUNITY)
Admission: RE | Admit: 2015-05-27 | Discharge: 2015-05-27 | Disposition: A | Discharge status: Home / Self Care | Payer: 59 | Source: Ambulatory Visit | Attending: Physician Assistant | Admitting: Physician Assistant

## 2015-05-27 DIAGNOSIS — Z1231 Encounter for screening mammogram for malignant neoplasm of breast: Secondary | ICD-10-CM | POA: Insufficient documentation

## 2015-08-12 ENCOUNTER — Encounter (HOSPITAL_COMMUNITY): Payer: Self-pay

## 2015-08-12 ENCOUNTER — Emergency Department (HOSPITAL_COMMUNITY)
Admission: EM | Admit: 2015-08-12 | Discharge: 2015-08-13 | Discharge status: Against Medical Advice | Payer: 59 | Attending: Emergency Medicine | Admitting: Emergency Medicine

## 2015-08-12 DIAGNOSIS — I10 Essential (primary) hypertension: Secondary | ICD-10-CM | POA: Diagnosis not present

## 2015-08-12 DIAGNOSIS — S299XXA Unspecified injury of thorax, initial encounter: Secondary | ICD-10-CM | POA: Insufficient documentation

## 2015-08-12 DIAGNOSIS — Y939 Activity, unspecified: Secondary | ICD-10-CM | POA: Diagnosis not present

## 2015-08-12 DIAGNOSIS — Z8639 Personal history of other endocrine, nutritional and metabolic disease: Secondary | ICD-10-CM | POA: Insufficient documentation

## 2015-08-12 DIAGNOSIS — W01198A Fall on same level from slipping, tripping and stumbling with subsequent striking against other object, initial encounter: Secondary | ICD-10-CM | POA: Diagnosis not present

## 2015-08-12 DIAGNOSIS — Y999 Unspecified external cause status: Secondary | ICD-10-CM | POA: Insufficient documentation

## 2015-08-12 DIAGNOSIS — Z87891 Personal history of nicotine dependence: Secondary | ICD-10-CM | POA: Insufficient documentation

## 2015-08-12 DIAGNOSIS — Y929 Unspecified place or not applicable: Secondary | ICD-10-CM | POA: Diagnosis not present

## 2015-08-12 DIAGNOSIS — Z8719 Personal history of other diseases of the digestive system: Secondary | ICD-10-CM | POA: Insufficient documentation

## 2015-08-12 NOTE — ED Notes (Signed)
The pt has decided that she will leave and come back tomorrow.  She has stopped crying like she was when she came in and she walked from the wheelchair to tell me this

## 2015-08-12 NOTE — ED Notes (Signed)
Pt here with exhusband after falling tonight. Was up in a chair and lost her footing and fell hitting her head on the cabinet and landed on the floor. Having upper back pain. States it knocked her out for about 2-3 minutes and made herself come to.

## 2015-08-13 ENCOUNTER — Emergency Department (HOSPITAL_COMMUNITY): Payer: 59

## 2015-08-13 ENCOUNTER — Emergency Department (HOSPITAL_COMMUNITY)
Admission: EM | Admit: 2015-08-13 | Discharge: 2015-08-13 | Disposition: A | Discharge status: Home / Self Care | Payer: 59 | Source: Home / Self Care | Attending: Emergency Medicine | Admitting: Emergency Medicine

## 2015-08-13 ENCOUNTER — Encounter (HOSPITAL_COMMUNITY): Payer: Self-pay | Admitting: Vascular Surgery

## 2015-08-13 DIAGNOSIS — S299XXA Unspecified injury of thorax, initial encounter: Secondary | ICD-10-CM | POA: Insufficient documentation

## 2015-08-13 DIAGNOSIS — Z792 Long term (current) use of antibiotics: Secondary | ICD-10-CM

## 2015-08-13 DIAGNOSIS — Y929 Unspecified place or not applicable: Secondary | ICD-10-CM | POA: Insufficient documentation

## 2015-08-13 DIAGNOSIS — S3992XA Unspecified injury of lower back, initial encounter: Secondary | ICD-10-CM | POA: Insufficient documentation

## 2015-08-13 DIAGNOSIS — Y999 Unspecified external cause status: Secondary | ICD-10-CM | POA: Insufficient documentation

## 2015-08-13 DIAGNOSIS — Z87891 Personal history of nicotine dependence: Secondary | ICD-10-CM | POA: Insufficient documentation

## 2015-08-13 DIAGNOSIS — I10 Essential (primary) hypertension: Secondary | ICD-10-CM

## 2015-08-13 DIAGNOSIS — Y939 Activity, unspecified: Secondary | ICD-10-CM | POA: Insufficient documentation

## 2015-08-13 DIAGNOSIS — R55 Syncope and collapse: Secondary | ICD-10-CM

## 2015-08-13 DIAGNOSIS — W1789XA Other fall from one level to another, initial encounter: Secondary | ICD-10-CM

## 2015-08-13 DIAGNOSIS — Z8719 Personal history of other diseases of the digestive system: Secondary | ICD-10-CM | POA: Insufficient documentation

## 2015-08-13 DIAGNOSIS — M545 Low back pain, unspecified: Secondary | ICD-10-CM

## 2015-08-13 DIAGNOSIS — S0990XA Unspecified injury of head, initial encounter: Secondary | ICD-10-CM | POA: Insufficient documentation

## 2015-08-13 DIAGNOSIS — Z8639 Personal history of other endocrine, nutritional and metabolic disease: Secondary | ICD-10-CM

## 2015-08-13 DIAGNOSIS — M546 Pain in thoracic spine: Secondary | ICD-10-CM

## 2015-08-13 DIAGNOSIS — Z79899 Other long term (current) drug therapy: Secondary | ICD-10-CM

## 2015-08-13 MED ORDER — ACETAMINOPHEN 325 MG PO TABS
650.0000 mg | ORAL_TABLET | Freq: Once | ORAL | Status: AC
  Administered 2015-08-13: 650 mg via ORAL
  Filled 2015-08-13: qty 2

## 2015-08-13 MED ORDER — CYCLOBENZAPRINE HCL 10 MG PO TABS: 10.0000 mg | ORAL_TABLET | Freq: Two times a day (BID) | ORAL | Status: DC | PRN

## 2015-08-13 NOTE — ED Notes (Signed)
Patient states several years ago she was prescribed an anticoagulant by her primary physician but it was discontinued due to GI side effects.  States she does not know why she was taking an anticoagulant.

## 2015-08-13 NOTE — ED Notes (Signed)
Pt reports to the ED for eval of back pain and HA following a fall that occurred yesterday. She reports she was standing on a chair and while she was getting down she fell and landed flat on her back. Reports she did hit her head and had a brief loss of consciousness. Denies being na blood thinner. Denies any blurred vision, double vision, or neck pain. Also denies any numbness, tingling, paralysis, or bowel or bladder changes. Pt A&Ox4, resp e/u, and skin warm and dry.

## 2015-08-13 NOTE — ED Provider Notes (Signed)
CSN: [id number]     Arrival date & time 08/13/15  1212 History   First MD Initiated Contact with Patient 08/13/15 1231     Chief Complaint  Patient presents with  . Fall    HPI   54 year old female presents today status post fall. Patient reports yesterday she was standing on a stool when it slipped causing her to tumble to the floor landing on her back and striking her head. She reports a brief loss of consciousness with complete resolution of cognitive and motor functions; no loss of bowel or bladder function, or amnesia. She states that she had immediate thoracic and lumbar back pain; denies radiation of symptoms, loss of distal strength, sensation or function. She reports being able to ambulate after the fall but has some persistent pain. This is made worse with forward flexion of the spine. Pt denies a previous history of similar back pain. Pt also reports frontal headache, describes it at throbbing, no radiation of symptoms. She has not tried OTC therapies. Pt denies and back pain or head injury red flags.    Past Medical History  Diagnosis Date  . Hypertension   . Hyperlipidemia   . GI bleed    History reviewed. No pertinent past surgical history. No family history on file. Social History  Substance Use Topics  . Smoking status: Former Smoker    Quit date: 06/28/2011  . Smokeless tobacco: Never Used  . Alcohol Use: No   OB History    No data available     Review of Systems  All other systems reviewed and are negative.  Allergies  Metronidazole hcl  Home Medications   Prior to Admission medications   Medication Sig Start Date End Date Taking? Authorizing Provider  amoxicillin (AMOXIL) 500 MG capsule Take 1 capsule (500 mg total) by mouth 3 (three) times daily. 01/28/15   Tiffany Carlota Raspberry, PA-C  cyclobenzaprine (FLEXERIL) 10 MG tablet Take 1 tablet (10 mg total) by mouth 2 (two) times daily as needed for muscle spasms. 08/13/15   Seger Jani, PA-C  guaiFENesin-codeine  100-10 MG/5ML syrup Take 5 mLs by mouth at bedtime as needed for cough. 01/28/15   Delos Haring, PA-C  HYDROcodone-acetaminophen (NORCO/VICODIN) 5-325 MG per tablet Take 1 tablet by mouth every 6 (six) hours as needed for moderate pain. 11/14/14   Christopher Lawyer, PA-C  lisinopril (PRINIVIL,ZESTRIL) 30 MG tablet Take 30 mg by mouth daily.    Historical Provider, MD  oxyCODONE-acetaminophen (PERCOCET/ROXICET) 5-325 MG per tablet Take 1-2 tablets by mouth every 6 (six) hours as needed for severe pain. Patient not taking: Reported on 11/14/2014 10/02/13   Delos Haring, PA-C  pantoprazole (PROTONIX) 20 MG tablet Take 1 tablet (20 mg total) by mouth 2 (two) times daily. Patient not taking: Reported on 11/14/2014 09/28/13   Dorie Rank, MD   BP 152/78 mmHg  Pulse 70  Temp(Src) 97.9 F (36.6 C) (Oral)  Resp 21  SpO2 97%   Physical Exam  Constitutional: She is oriented to person, place, and time. She appears well-developed and well-nourished. No distress.  HENT:  Head: Normocephalic and atraumatic.  Right Ear: No drainage.  Left Ear: No drainage.  Eyes: Conjunctivae are normal. Pupils are equal, round, and reactive to light. Right eye exhibits no discharge. Left eye exhibits no discharge. No scleral icterus.  Neck: Normal range of motion. Neck supple. No JVD present. No tracheal deviation present.  Cardiovascular: Normal rate, regular rhythm, normal heart sounds and intact distal pulses.  Exam reveals  no gallop and no friction rub.   No murmur heard. Pulmonary/Chest: Effort normal and breath sounds normal. No stridor. No respiratory distress. She has no wheezes. She has no rales. She exhibits no tenderness.  Abdominal: Soft. She exhibits no distension.  Musculoskeletal: Normal range of motion. She exhibits tenderness. She exhibits no edema.  T and L spine tenderness to palpation. No C spine tenderness or pain to palpation of soft tissue. No obvious signs of trauma, deformity, infection, step-offs.  Lung expansion normal. No scoliosis or kyphosis. Bilateral lower extremity strength 5 out of 5, sensation grossly intact, patellar reflexes 2+, pedal pulses 2+, Refill less than 3 seconds.  Straight leg negative Ambulates without difficult    Neurological: She is alert and oriented to person, place, and time. Coordination normal.  Skin: Skin is warm and dry. She is not diaphoretic.  Psychiatric: She has a normal mood and affect. Her behavior is normal. Judgment and thought content normal.  Nursing note and vitals reviewed.    ED Course  Procedures (including critical care time) Labs Review Labs Reviewed - No data to display  Imaging Review No results found. I have personally reviewed and evaluated these images and lab results as part of my medical decision-making.   EKG Interpretation None      MDM   Final diagnoses:  Bilateral thoracic back pain  Bilateral low back pain without sciatica   Labs:  Imaging: DG lumbar spine, DG thoracic spine- no acute fracture  Consults:  Therapeutics:  Discharge Meds: flexeril - tylenol  Assessment/Plan: 53 year old female presents status post fall. She has thoracic and lumbar pain likely muscular strain. She has no red flags for back pain, ambulates without difficulty. Does not meet [place] head ct criteria and clinically does not appear to have and signs or symtpoms that would necessitate head CT. She will be discharged home with instructions to use Tylenol, Flexeril as needed. Follow-up with primary care in 1 week if symptoms persist, follow-up sooner as needed. Patient verbalizes understanding and agreement to today's plan.        Okey Regal, PA-C 08/15/15 1432  Veryl Speak, MD 08/17/15 [phone]

## 2015-08-13 NOTE — Discharge Instructions (Signed)
Back Pain, Adult °Back pain is very common in adults. The cause of back pain is rarely dangerous and the pain often gets better over time. The cause of your back pain may not be known. Some common causes of back pain include: °· Strain of the muscles or ligaments supporting the spine. °· Wear and tear (degeneration) of the spinal disks. °· Arthritis. °· Direct injury to the back. °For many people, back pain may return. Since back pain is rarely dangerous, most people can learn to manage this condition on their own. °HOME CARE INSTRUCTIONS °Watch your back pain for any changes. The following actions may help to lessen any discomfort you are feeling: °· Remain active. It is stressful on your back to sit or stand in one place for long periods of time. Do not sit, drive, or stand in one place for more than 30 minutes at a time. Take short walks on even surfaces as soon as you are able. Try to increase the length of time you walk each day. °· Exercise regularly as directed by your health care provider. Exercise helps your back heal faster. It also helps avoid future injury by keeping your muscles strong and flexible. °· Do not stay in bed. Resting more than 1-2 days can delay your recovery. °· Pay attention to your body when you bend and lift. The most comfortable positions are those that put less stress on your recovering back. Always use proper lifting techniques, including: °¨ Bending your knees. °¨ Keeping the load close to your body. °¨ Avoiding twisting. °· Find a comfortable position to sleep. Use a firm mattress and lie on your side with your knees slightly bent. If you lie on your back, put a pillow under your knees. °· Avoid feeling anxious or stressed. Stress increases muscle tension and can worsen back pain. It is important to recognize when you are anxious or stressed and learn ways to manage it, such as with exercise. °· Take medicines only as directed by your health care provider. Over-the-counter  medicines to reduce pain and inflammation are often the most helpful. Your health care provider may prescribe muscle relaxant drugs. These medicines help dull your pain so you can more quickly return to your normal activities and healthy exercise. °· Apply ice to the injured area: °¨ Put ice in a plastic bag. °¨ Place a towel between your skin and the bag. °¨ Leave the ice on for 20 minutes, 2-3 times a day for the first 2-3 days. After that, ice and heat may be alternated to reduce pain and spasms. °· Maintain a healthy weight. Excess weight puts extra stress on your back and makes it difficult to maintain good posture. °SEEK MEDICAL CARE IF: °· You have pain that is not relieved with rest or medicine. °· You have increasing pain going down into the legs or buttocks. °· You have pain that does not improve in one week. °· You have night pain. °· You lose weight. °· You have a fever or chills. °SEEK IMMEDIATE MEDICAL CARE IF:  °· You develop new bowel or bladder control problems. °· You have unusual weakness or numbness in your arms or legs. °· You develop nausea or vomiting. °· You develop abdominal pain. °· You feel faint. °  °This information is not intended to replace advice given to you by your health care provider. Make sure you discuss any questions you have with your health care provider. °  °Document Released: 10/15/2005 Document Revised: 11/05/2014 Document Reviewed: 02/16/2014 °Elsevier Interactive Patient Education ©2016 [employer]   Inc[employer]  Please read attached information. If you experience any new or worsening signs or symptoms please return to the emergency room for evaluation. Please follow-up with your primary care provider or specialist as discussed. Please use medication prescribed only as directed and discontinue taking if you have any concerning signs or symptoms.

## 2015-09-24 ENCOUNTER — Emergency Department (HOSPITAL_COMMUNITY)
Admission: EM | Admit: 2015-09-24 | Discharge: 2015-09-24 | Disposition: A | Discharge status: Home / Self Care | Payer: 59 | Attending: Emergency Medicine | Admitting: Emergency Medicine

## 2015-09-24 ENCOUNTER — Emergency Department (HOSPITAL_COMMUNITY): Payer: 59

## 2015-09-24 ENCOUNTER — Encounter (HOSPITAL_COMMUNITY): Payer: Self-pay | Admitting: Neurology

## 2015-09-24 DIAGNOSIS — R1010 Upper abdominal pain, unspecified: Secondary | ICD-10-CM | POA: Diagnosis present

## 2015-09-24 DIAGNOSIS — R1011 Right upper quadrant pain: Secondary | ICD-10-CM | POA: Insufficient documentation

## 2015-09-24 DIAGNOSIS — K5900 Constipation, unspecified: Secondary | ICD-10-CM | POA: Insufficient documentation

## 2015-09-24 DIAGNOSIS — I10 Essential (primary) hypertension: Secondary | ICD-10-CM | POA: Insufficient documentation

## 2015-09-24 DIAGNOSIS — R1110 Vomiting, unspecified: Secondary | ICD-10-CM | POA: Diagnosis not present

## 2015-09-24 DIAGNOSIS — F17200 Nicotine dependence, unspecified, uncomplicated: Secondary | ICD-10-CM | POA: Diagnosis not present

## 2015-09-24 DIAGNOSIS — Z8639 Personal history of other endocrine, nutritional and metabolic disease: Secondary | ICD-10-CM | POA: Insufficient documentation

## 2015-09-24 DIAGNOSIS — R1013 Epigastric pain: Secondary | ICD-10-CM | POA: Insufficient documentation

## 2015-09-24 LAB — COMPREHENSIVE METABOLIC PANEL
ALT: 37 U/L (ref 14–54)
AST: 43 U/L — ABNORMAL HIGH (ref 15–41)
Albumin: 4.1 g/dL (ref 3.5–5.0)
Alkaline Phosphatase: 107 U/L (ref 38–126)
Anion gap: 8 (ref 5–15)
BUN: 20 mg/dL (ref 6–20)
CHLORIDE: 107 mmol/L (ref 101–111)
CO2: 26 mmol/L (ref 22–32)
CREATININE: 1.14 mg/dL — AB (ref 0.44–1.00)
Calcium: 9 mg/dL (ref 8.9–10.3)
GFR calc Af Amer: >60 mL/min (ref >60)
GFR, EST NON AFRICAN AMERICAN: 53 mL/min — AB (ref >60)
Glucose, Bld: 102 mg/dL — ABNORMAL HIGH (ref 65–99)
POTASSIUM: 3.9 mmol/L (ref 3.5–5.1)
Sodium: 141 mmol/L (ref 135–145)
Total Bilirubin: 0.8 mg/dL (ref 0.3–1.2)
Total Protein: 7.5 g/dL (ref 6.5–8.1)

## 2015-09-24 LAB — CBC WITH DIFFERENTIAL/PLATELET
BASOS PCT: 0 %
Basophils Absolute: 0 10*3/uL (ref 0.0–0.1)
Eosinophils Absolute: 0.2 10*3/uL (ref 0.0–0.7)
Eosinophils Relative: 3 %
HEMATOCRIT: 36.8 % (ref 36.0–46.0)
HEMOGLOBIN: 10.9 g/dL — AB (ref 12.0–15.0)
LYMPHS PCT: 46 %
Lymphs Abs: 3.1 10*3/uL (ref 0.7–4.0)
MCH: 23.4 pg — ABNORMAL LOW (ref 26.0–34.0)
MCHC: 29.6 g/dL — ABNORMAL LOW (ref 30.0–36.0)
MCV: 79.1 fL (ref 78.0–100.0)
MONOS PCT: 6 %
Monocytes Absolute: 0.4 10*3/uL (ref 0.1–1.0)
NEUTROS ABS: 3 10*3/uL (ref 1.7–7.7)
Neutrophils Relative %: 45 %
Platelets: 477 10*3/uL — ABNORMAL HIGH (ref 150–400)
RBC: 4.65 MIL/uL (ref 3.87–5.11)
RDW: 21.5 % — ABNORMAL HIGH (ref 11.5–15.5)
WBC: 6.7 10*3/uL (ref 4.0–10.5)

## 2015-09-24 LAB — LIPASE, BLOOD: Lipase: 26 U/L (ref 11–51)

## 2015-09-24 LAB — URINALYSIS, ROUTINE W REFLEX MICROSCOPIC
Bilirubin Urine: NEGATIVE
Glucose, UA: NEGATIVE mg/dL
Hgb urine dipstick: NEGATIVE
Ketones, ur: NEGATIVE mg/dL
LEUKOCYTES UA: NEGATIVE
NITRITE: NEGATIVE
PH: 5.5 (ref 5.0–8.0)
Protein, ur: NEGATIVE mg/dL
SPECIFIC GRAVITY, URINE: 1.016 (ref 1.005–1.030)

## 2015-09-24 MED ORDER — IOHEXOL 300 MG/ML  SOLN
100.0000 mL | Freq: Once | INTRAMUSCULAR | Status: AC | PRN
  Administered 2015-09-24: 100 mL via INTRAVENOUS

## 2015-09-24 MED ORDER — MORPHINE SULFATE (PF) 4 MG/ML IV SOLN
4.0000 mg | Freq: Once | INTRAVENOUS | Status: AC
  Administered 2015-09-24: 4 mg via INTRAVENOUS
  Filled 2015-09-24: qty 1

## 2015-09-24 MED ORDER — IOHEXOL 300 MG/ML  SOLN: 25.0000 mL | INTRAMUSCULAR | Status: AC

## 2015-09-24 MED ORDER — HYDROCODONE-ACETAMINOPHEN 5-325 MG PO TABS: 1.0000 | ORAL_TABLET | Freq: Four times a day (QID) | ORAL | Status: DC | PRN

## 2015-09-24 MED ORDER — SODIUM CHLORIDE 0.9 % IV BOLUS (SEPSIS)
1000.0000 mL | Freq: Once | INTRAVENOUS | Status: AC
  Administered 2015-09-24: 1000 mL via INTRAVENOUS

## 2015-09-24 MED ORDER — IOHEXOL 300 MG/ML  SOLN: 25.0000 mL | INTRAMUSCULAR | Status: DC

## 2015-09-24 NOTE — ED Notes (Signed)
Pt reports mid abdominal hernia for several years, has been having pain for 1 month after a fall. Reports last BM yesterday, denies n/v.

## 2015-09-24 NOTE — ED Provider Notes (Signed)
CSN: [id number]     Arrival date & time 09/24/15  1020 History   First MD Initiated Contact with Patient 09/24/15 1053     Chief Complaint  Patient presents with  . Hernia     (Consider location/radiation/quality/duration/timing/severity/associated sxs/prior Treatment) HPI 54 year old female presents with upper abdominal pain that worsened last night. Has been present since a fall on her back one month ago. Patient states the pain is present constantly but worsens whenever she does any type of movement. Eating does not make the pain worse. Vomited last night, not currently nauseated. States she has been straining to use the bathroom but denies any blood in her stool. She was told that she had an abdominal hernia when she last saw a doctor, has never had it evaluated since. Patient states the pain worsened significantly this morning. No urinary symptoms. No fevers. Patient states when she stands up you can see a bulge in her upper abdomen.   Past Medical History  Diagnosis Date  . Hypertension   . Hyperlipidemia   . GI bleed    History reviewed. No pertinent past surgical history. No family history on file. Social History  Substance Use Topics  . Smoking status: Current Every Day Smoker    Last Attempt to Quit: 06/28/2011  . Smokeless tobacco: Never Used  . Alcohol Use: No   OB History    No data available     Review of Systems  Constitutional: Negative for fever.  Gastrointestinal: Positive for vomiting, abdominal pain, constipation and abdominal distention.  All other systems reviewed and are negative.     Allergies  Metronidazole hcl  Home Medications   Prior to Admission medications   Medication Sig Start Date End Date Taking? Authorizing Provider  cyclobenzaprine (FLEXERIL) 10 MG tablet Take 1 tablet (10 mg total) by mouth 2 (two) times daily as needed for muscle spasms. 08/13/15  Yes Jeffrey Hedges, PA-C  lisinopril (PRINIVIL,ZESTRIL) 30 MG tablet Take 30 mg by  mouth daily.   Yes Historical Provider, MD   BP 162/113 mmHg  Pulse 96  Temp(Src) 97.7 F (36.5 C) (Oral)  Resp 14  SpO2 98% Physical Exam  Constitutional: She is oriented to person, place, and time. She appears well-developed and well-nourished.  HENT:  Head: Normocephalic and atraumatic.  Right Ear: External ear normal.  Left Ear: External ear normal.  Nose: Nose normal.  Eyes: Right eye exhibits no discharge. Left eye exhibits no discharge.  Cardiovascular: Normal rate, regular rhythm and normal heart sounds.   Pulmonary/Chest: Effort normal and breath sounds normal.  Abdominal: Soft. There is tenderness in the right upper quadrant and epigastric area. No hernia.  No appreciable hernia on my exam  Neurological: She is alert and oriented to person, place, and time.  Skin: Skin is warm and dry.  Nursing note and vitals reviewed.   ED Course  Procedures (including critical care time) Labs Review Labs Reviewed  COMPREHENSIVE METABOLIC PANEL - Abnormal; Notable for the following:    Glucose, Bld 102 (*)    Creatinine, Ser 1.14 (*)    AST 43 (*)    GFR calc non Af Amer 53 (*)    All other components within normal limits  CBC WITH DIFFERENTIAL/PLATELET - Abnormal; Notable for the following:    Hemoglobin 10.9 (*)    MCH 23.4 (*)    MCHC 29.6 (*)    RDW 21.5 (*)    Platelets 477 (*)    All other components within normal limits  LIPASE, BLOOD  URINALYSIS, ROUTINE W REFLEX MICROSCOPIC (NOT AT [facility])    Imaging Review Ct Abdomen Pelvis W Contrast  09/24/2015  CLINICAL DATA:  Upper abdominal pain for 1 month since falling, history of hernia EXAM: CT ABDOMEN AND PELVIS WITH CONTRAST TECHNIQUE: Multidetector CT imaging of the abdomen and pelvis was performed using the standard protocol following bolus administration of intravenous contrast. CONTRAST:  178mL OMNIPAQUE IOHEXOL 300 MG/ML  SOLN COMPARISON:  11/14/2014 FINDINGS: Lower chest: Hepatobiliary: Mild hepatic steatosis.  Stable 1 cm cyst left lobe of liver. Pancreas: Normal Spleen: Normal Adrenals/Urinary Tract: Or mm cyst lower pole left kidney, stable. Otherwise normal. Stomach/Bowel: Large bowel and appendix are normal. Stomach and small bowel are normal. Vascular/Lymphatic: Moderate calcification of the aortoiliac vessels. Reproductive: Negative Other: No ascites. Musculoskeletal: No acute or focal suspicious bone lesions. IMPRESSION: No acute abnormalities. No findings to explain the patient's symptoms. Electronically Signed   By: Skipper Cliche M.D.   On: 09/24/2015 14:18   I have personally reviewed and evaluated these images and lab results as part of my medical decision-making.   EKG Interpretation None      MDM   Final diagnoses:  Upper abdominal pain    Patient's upper abdominal pain is of unclear etiology. There is no obvious hernia on exam and the CT scan is unremarkable. No obvious gallbladder pathology. Suspicion is low for cholelithiasis or cholecystitis I do not feel ultrasound is needed. Most likely muscular. Plan to treat with NSAIDs, narcotics, and follow-up with PCP.    Sherwood Gambler, MD 09/24/15 [phone]

## 2015-09-24 NOTE — Discharge Instructions (Signed)

## 2015-11-27 ENCOUNTER — Encounter (HOSPITAL_COMMUNITY): Payer: Self-pay | Admitting: *Deleted

## 2015-11-27 ENCOUNTER — Emergency Department (HOSPITAL_COMMUNITY)
Admission: EM | Admit: 2015-11-27 | Discharge: 2015-11-27 | Disposition: A | Discharge status: Home / Self Care | Payer: BLUE CROSS/BLUE SHIELD | Attending: Emergency Medicine | Admitting: Emergency Medicine

## 2015-11-27 DIAGNOSIS — F17200 Nicotine dependence, unspecified, uncomplicated: Secondary | ICD-10-CM | POA: Diagnosis not present

## 2015-11-27 DIAGNOSIS — S80862A Insect bite (nonvenomous), left lower leg, initial encounter: Secondary | ICD-10-CM | POA: Diagnosis present

## 2015-11-27 DIAGNOSIS — Z8719 Personal history of other diseases of the digestive system: Secondary | ICD-10-CM | POA: Insufficient documentation

## 2015-11-27 DIAGNOSIS — Z79899 Other long term (current) drug therapy: Secondary | ICD-10-CM | POA: Diagnosis not present

## 2015-11-27 DIAGNOSIS — Y9389 Activity, other specified: Secondary | ICD-10-CM | POA: Insufficient documentation

## 2015-11-27 DIAGNOSIS — W57XXXA Bitten or stung by nonvenomous insect and other nonvenomous arthropods, initial encounter: Secondary | ICD-10-CM | POA: Insufficient documentation

## 2015-11-27 DIAGNOSIS — Z8639 Personal history of other endocrine, nutritional and metabolic disease: Secondary | ICD-10-CM | POA: Insufficient documentation

## 2015-11-27 DIAGNOSIS — Y998 Other external cause status: Secondary | ICD-10-CM | POA: Insufficient documentation

## 2015-11-27 DIAGNOSIS — I10 Essential (primary) hypertension: Secondary | ICD-10-CM | POA: Diagnosis not present

## 2015-11-27 DIAGNOSIS — Y9289 Other specified places as the place of occurrence of the external cause: Secondary | ICD-10-CM | POA: Insufficient documentation

## 2015-11-27 MED ORDER — DOXYCYCLINE HYCLATE 100 MG PO CAPS: 100.0000 mg | ORAL_CAPSULE | Freq: Two times a day (BID) | ORAL | Status: DC

## 2015-11-27 MED ORDER — HYDROCORTISONE 1 % EX CREA: TOPICAL_CREAM | CUTANEOUS | Status: DC

## 2015-11-27 MED ORDER — DIPHENHYDRAMINE HCL 25 MG PO CAPS
25.0000 mg | ORAL_CAPSULE | Freq: Once | ORAL | Status: AC
  Administered 2015-11-27: 25 mg via ORAL
  Filled 2015-11-27: qty 1

## 2015-11-27 MED ORDER — DOXYCYCLINE HYCLATE 100 MG PO TABS
100.0000 mg | ORAL_TABLET | Freq: Once | ORAL | Status: AC
  Administered 2015-11-27: 100 mg via ORAL
  Filled 2015-11-27: qty 1

## 2015-11-27 NOTE — ED Notes (Signed)
PT reports seeing multiple insect bites.

## 2015-11-27 NOTE — ED Provider Notes (Signed)
CSN: [id number]     Arrival date & time 11/27/15  1736 History  By signing my name below, I, Mariah Foster, attest that this documentation has been prepared under the direction and in the presence of non-physician practitioner, Mariah Hopping, PA-C. Electronically Signed: Evelene Foster, Scribe. 11/27/2015. 6:09 PM.    Chief Complaint  Patient presents with  . Insect Bite     The history is provided by the patient. No language interpreter was used.     HPI Comments:  Mariah Foster is a 55 y.o. female who presents to the Emergency Department complaining of multiple small circular bumps to her left lower leg first noticed ~ 4 days ago. She notes the sites itch. 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 Patient denies chemical exposure. She has applied alcohol and and neosporin with minimal relief. Pt has no other complaints or symptoms at this time. She denies recent exposure to any animals.   Past Medical History  Diagnosis Date  . Hypertension   . Hyperlipidemia   . GI bleed    History reviewed. No pertinent past surgical history. History reviewed. No pertinent family history. Social History  Substance Use Topics  . Smoking status: Current Every Day Smoker    Last Attempt to Quit: 06/28/2011  . Smokeless tobacco: Never Used  . Alcohol Use: No   OB History    No data available     Review of Systems  Constitutional: Negative for fever and chills.  Skin: Positive for rash. Negative for pallor and wound.   Allergies  Metronidazole hcl  Home Medications   Prior to Admission medications   Medication Sig Start Date End Date Taking? Authorizing Provider  cyclobenzaprine (FLEXERIL) 10 MG tablet Take 1 tablet (10 mg total) by mouth 2 (two) times daily as needed for muscle spasms. 08/13/15   Mariah Regal, PA-C  doxycycline (VIBRAMYCIN) 100 MG capsule Take 1 capsule (100 mg total) by mouth 2  (two) times daily. 11/27/15   Mariah C Joy, PA-C  HYDROcodone-acetaminophen (NORCO) 5-325 MG tablet Take 1 tablet by mouth every 6 (six) hours as needed for severe pain. 09/24/15   Mariah Gambler, MD  hydrocortisone cream 1 % Apply to affected area 2 times daily 11/27/15   Mariah C Joy, PA-C  lisinopril (PRINIVIL,ZESTRIL) 30 MG tablet Take 30 mg by mouth daily.    Historical Provider, MD   BP 140/86 mmHg  Pulse 102  Temp(Src) 98.2 F (36.8 C) (Oral)  Resp 18  SpO2 98% Physical Exam  Constitutional: She is oriented to person, place, and time. She appears well-developed and well-nourished. No distress.  HENT:  Head: Normocephalic and atraumatic.  Eyes: Conjunctivae are normal.  Cardiovascular: Normal rate and intact distal pulses.   Pulmonary/Chest: Effort normal.  Abdominal: She exhibits no distension.  Musculoskeletal:  No gait abnormalities. Pulse, motor, and sensory intact distal.  Neurological: She is alert and oriented to person, place, and time.  Skin: Skin is warm and dry.  3 circular areas of erythema on medial aspect of left lower leg all smaller than a dime  Tenderness noted. No fluctuance or induration noted   Psychiatric: She has a normal mood and affect.  Nursing note and vitals reviewed.   ED Course  [place] bedside Date/Time: 11/27/2015 7:04 PM Performed by: Mariah Foster Authorized by: Mariah Foster Consent: Verbal consent obtained. Risks and benefits: risks,  benefits and alternatives were discussed Consent given by: patient Patient understanding: patient states understanding of the procedure being performed Patient consent: the patient's understanding of the procedure matches consent given Procedure consent: procedure consent matches procedure scheduled Patient identity confirmed: verbally with patient and arm band Time out: Immediately prior to procedure a "time out" was called to verify the correct patient, procedure, equipment, support staff and site/side marked as  required. Local anesthesia used: no Patient sedated: no Patient tolerance: Patient tolerated the procedure well with no immediate complications Comments: Bedside ultrasound was utilized to assess an area of edema that the patient pointing out that was surrounding the skin lesions in question. There is no sign of abscess, fluid collection, or cellulitis. The area was not tender when pressure was exerted during the exam.     DIAGNOSTIC STUDIES:  Oxygen Saturation is 98% on RA, normal by my interpretation.    COORDINATION OF CARE:  6:00 PM Will discharge with steroid cream and advised pt to alternate with petroleum jelly. Discussed treatment plan with pt at bedside and pt agreed to plan.   MDM   Final diagnoses:  Insect bite    Mariah Foster presents with small area of skin lesions on her lower leg for the last 4 days.  Patient with  3 circular areas of erythema on her LLE. She is Afebrile. No tachycardia, hypotension or other symptoms suggestive of severe infection. Pt advised to follow up for wound check in 2-3 days, sooner for worsening systemic symptoms, new lymphangitis, or significant spread of erythema. Will discharge with antibiotic and hydrocortisone cream. Return precautions discussed. Pt appears safe for discharge.   I personally performed the services described in this documentation, which was scribed in my presence. The recorded information has been reviewed and is accurate.    Mariah Bender, PA-C 11/27/15 [place], MD 11/27/15 2329

## 2015-11-27 NOTE — ED Notes (Signed)
See PA assessment 

## 2015-11-27 NOTE — Discharge Instructions (Signed)
You have been seen today for a possible insect bite. Your imaging showed no abnormalities. Follow up with PCP if no improvement. Return to ED should symptoms worsen. Take 25 mg of benadryl every 6 hours for the next three days. Do not drive or do anything dangerous while using Benadryl. Use ice or heat to help reduce the inflammation.

## 2015-11-27 NOTE — ED Notes (Signed)
Declined W/C at D/C and was escorted to lobby by RN. 

## 2015-12-29 ENCOUNTER — Ambulatory Visit: Payer: BLUE CROSS/BLUE SHIELD | Admitting: Physician Assistant

## 2016-01-05 ENCOUNTER — Emergency Department (HOSPITAL_COMMUNITY)
Admission: EM | Admit: 2016-01-05 | Discharge: 2016-01-05 | Disposition: A | Discharge status: Home / Self Care | Payer: BLUE CROSS/BLUE SHIELD | Attending: Emergency Medicine | Admitting: Emergency Medicine

## 2016-01-05 ENCOUNTER — Encounter (HOSPITAL_COMMUNITY): Payer: Self-pay | Admitting: Emergency Medicine

## 2016-01-05 DIAGNOSIS — E785 Hyperlipidemia, unspecified: Secondary | ICD-10-CM | POA: Insufficient documentation

## 2016-01-05 DIAGNOSIS — Z79899 Other long term (current) drug therapy: Secondary | ICD-10-CM | POA: Diagnosis not present

## 2016-01-05 DIAGNOSIS — R21 Rash and other nonspecific skin eruption: Secondary | ICD-10-CM

## 2016-01-05 DIAGNOSIS — Z792 Long term (current) use of antibiotics: Secondary | ICD-10-CM | POA: Insufficient documentation

## 2016-01-05 DIAGNOSIS — F17210 Nicotine dependence, cigarettes, uncomplicated: Secondary | ICD-10-CM | POA: Insufficient documentation

## 2016-01-05 DIAGNOSIS — I10 Essential (primary) hypertension: Secondary | ICD-10-CM | POA: Insufficient documentation

## 2016-01-05 NOTE — ED Notes (Signed)
Patient states rash on both arms x 2 weeks.  Patient is an Psychologist, occupational.   Patient states had been treated for bug bites x 2 weeks ago and she used the cream that was given to her to treat that.   Complains that the rash itches and burns.

## 2016-01-05 NOTE — ED Provider Notes (Signed)
CSN: [id number]     Arrival date & time 01/05/16  1102 History  By signing my name below, I, Hansel Feinstein, attest that this documentation has been prepared under the direction and in the presence of [employer], PA-C. Electronically Signed: Hansel Feinstein, ED Scribe. 01/05/2016. 12:23 PM.     Chief Complaint  Patient presents with  . Rash   The history is provided by the patient. No language interpreter was used.   HPI Comments: Mariah Foster is a 55 y.o. female with h/o HTN, HLD who presents to the Emergency Department complaining of a scattered pruritic rash to bilateral forearms for 2 weeks. Pt states her rash seems to be exacerbated with palpation. No known sick contact. 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 No new soaps, lotions, detergents, foods, animals, plants, medications. She notes she has tried applying hydrocortisone cream since yesterday with no relief of rash. No Hx of similar symptoms. Pt states she has not been evaluated by her PCP for her current symptoms. She denies IVDU. She also denies fever, chills, nausea, emesis.    Past Medical History  Diagnosis Date  . Hypertension   . Hyperlipidemia   . GI bleed    History reviewed. No pertinent past surgical history. No family history on file. Social History  Substance Use Topics  . Smoking status: Current Every Day Smoker -- 0.20 packs/day    Types: Cigarettes  . Smokeless tobacco: Never Used  . Alcohol Use: Yes     Comment: socially   OB History    No data available     Review of Systems  All other systems reviewed and are negative.  Allergies  Metronidazole hcl  Home Medications   Prior to Admission medications   Medication Sig Start Date End Date Taking? Authorizing Provider  hydrocortisone cream 1 % Apply to affected area 2 times daily 11/27/15  Yes Shawn C Joy, PA-C  lisinopril (PRINIVIL,ZESTRIL) 30 MG tablet Take 30 mg by mouth daily.   Yes  Historical Provider, MD  cyclobenzaprine (FLEXERIL) 10 MG tablet Take 1 tablet (10 mg total) by mouth 2 (two) times daily as needed for muscle spasms. 08/13/15   Okey Regal, PA-C  doxycycline (VIBRAMYCIN) 100 MG capsule Take 1 capsule (100 mg total) by mouth 2 (two) times daily. 11/27/15   Shawn C Joy, PA-C  HYDROcodone-acetaminophen (NORCO) 5-325 MG tablet Take 1 tablet by mouth every 6 (six) hours as needed for severe pain. 09/24/15   Sherwood Gambler, MD   There were no vitals taken for this visit. Physical Exam  Constitutional: She is oriented to person, place, and time. She appears well-developed and well-nourished.  HENT:  Head: Normocephalic and atraumatic.  Eyes: Conjunctivae and EOM are normal. Pupils are equal, round, and reactive to light.  Neck: Normal range of motion. Neck supple.  Cardiovascular: Normal rate.   Pulmonary/Chest: Effort normal. No respiratory distress.  Abdominal: She exhibits no distension.  Musculoskeletal: Normal range of motion.  Neurological: She is alert and oriented to person, place, and time.  Skin: Skin is warm and dry. Rash noted.  Bilateral erythematous, dry rash to the flexor surface of the elbow. No vesicles. (see photo)  Psychiatric: She has a normal mood and affect. Her behavior is normal.  Nursing note and vitals reviewed.     ED Course  Procedures (including critical care time) DIAGNOSTIC STUDIES: Oxygen Saturation is 99% on RA, normal by my interpretation.  COORDINATION OF CARE: 12:16 PM Discussed treatment plan with pt at bedside which includes continued hydrocortisone cream use and pt agreed to plan.  MDM   Final diagnoses:  Rash    Labs:  Imaging:  Consults:  Therapeutics:  Discharge Meds: continue Hydrocortisone 1% use   Assessment/Plan: Patient presentation consistent with dermatitis. Instructed to avoid offending agent and to use unscented soaps, lotions, and detergents. Advised pt to continue hydrocortisone use.  No signs of secondary infection. Follow up with PCP in 2-3 days. Return precautions discussed and outlined in discharge paperwork. Pt is safe for discharge at this time. Pt is agreeable to plan.      I personally performed the services described in this documentation, which was scribed in my presence. The recorded information has been reviewed and is accurate.   Okey Regal, PA-C 01/05/16 [place], MD 01/05/16 [phone]

## 2016-01-05 NOTE — Discharge Instructions (Signed)
Please continue using steroid cream, please follow-up with your primary care provider in 3 days for reevaluation.

## 2016-02-24 ENCOUNTER — Ambulatory Visit: Payer: BLUE CROSS/BLUE SHIELD | Admitting: Gastroenterology

## 2016-04-24 ENCOUNTER — Other Ambulatory Visit: Payer: Self-pay | Admitting: Physician Assistant

## 2016-04-24 DIAGNOSIS — Z1231 Encounter for screening mammogram for malignant neoplasm of breast: Secondary | ICD-10-CM

## 2016-06-01 ENCOUNTER — Ambulatory Visit
Admission: RE | Admit: 2016-06-01 | Discharge: 2016-06-01 | Disposition: A | Discharge status: Home / Self Care | Payer: BLUE CROSS/BLUE SHIELD | Source: Ambulatory Visit | Attending: Physician Assistant | Admitting: Physician Assistant

## 2016-06-01 DIAGNOSIS — Z1231 Encounter for screening mammogram for malignant neoplasm of breast: Secondary | ICD-10-CM

## 2016-06-13 ENCOUNTER — Encounter (INDEPENDENT_AMBULATORY_CARE_PROVIDER_SITE_OTHER): Payer: Self-pay

## 2016-06-13 ENCOUNTER — Encounter: Payer: Self-pay | Admitting: Gastroenterology

## 2016-06-13 ENCOUNTER — Ambulatory Visit (INDEPENDENT_AMBULATORY_CARE_PROVIDER_SITE_OTHER): Payer: BLUE CROSS/BLUE SHIELD | Admitting: Gastroenterology

## 2016-06-13 VITALS — BP 126/70 | HR 76 | Ht 64.5 in | Wt 140.5 lb

## 2016-06-13 DIAGNOSIS — K219 Gastro-esophageal reflux disease without esophagitis: Secondary | ICD-10-CM

## 2016-06-13 DIAGNOSIS — D649 Anemia, unspecified: Secondary | ICD-10-CM

## 2016-06-13 DIAGNOSIS — R079 Chest pain, unspecified: Secondary | ICD-10-CM | POA: Diagnosis not present

## 2016-06-13 DIAGNOSIS — Z86010 Personal history of colonic polyps: Secondary | ICD-10-CM

## 2016-06-13 DIAGNOSIS — R740 Nonspecific elevation of levels of transaminase and lactic acid dehydrogenase [LDH]: Secondary | ICD-10-CM | POA: Diagnosis not present

## 2016-06-13 DIAGNOSIS — R7401 Elevation of levels of liver transaminase levels: Secondary | ICD-10-CM

## 2016-06-13 DIAGNOSIS — Z860101 Personal history of adenomatous and serrated colon polyps: Secondary | ICD-10-CM

## 2016-06-13 MED ORDER — OMEPRAZOLE 40 MG PO CPDR: 40.0000 mg | DELAYED_RELEASE_CAPSULE | Freq: Every day | ORAL | 11 refills | Status: DC

## 2016-06-13 NOTE — Patient Instructions (Signed)
We have sent the following medications to your pharmacy for you to pick up at your convenience: Omeprazole.  Patient advised to avoid spicy, acidic, citrus, chocolate, mints, fruit and fruit juices.  Limit the intake of caffeine, alcohol and Soda.  Don't exercise too soon after eating.  Don't lie down within 3-4 hours of eating.  Elevate the head of your bed.  You have been scheduled for an endoscopy. Please follow written instructions given to you at your visit today. If you use inhalers (even only as needed), please bring them with you on the day of your procedure. Your physician has requested that you go to www.startemmi.com and enter the access code given to you at your visit today. This web site gives a general overview about your procedure. However, you should still follow specific instructions given to you by our office regarding your preparation for the procedure.  Thank you for choosing me and [facility].  Pricilla Riffle. Dagoberto Ligas., MD., Marval Regal

## 2016-06-13 NOTE — Progress Notes (Signed)
    History of Present Illness: This is a 55 year old female referred by Nelle Don, PA-C for the evaluation of epigastric pain, chest pain, GERD. She is accompanied by her husband. She relates lower anterior chest pain that is worsened by wearing a bra and by direct pressure on the area for 1-2 years. She also relates worsening problems with reflux symptoms including heartburn and regurgitation for about one year. She states she was previously treated with prescription medications for reflux but has not been on any medications recently. She was evaluated in the [facility] ED in 08/2015 for the same complaints. CT scan below. Blood work showed a normocytic anemia Hb=10.9, AST=43. Symptoms were felt most likely to be musculoskeletal. Colonoscopy in March 2013 showed 1 adenomatous polyp and internal hemorrhoids. Denies weight loss, abdominal pain, constipation, diarrhea, change in stool caliber, melena, hematochezia, nausea, vomiting, dysphagia.  Abd/pelvic CT 08/2015 IMPRESSION: No acute abnormalities. No findings to explain the patient's symptoms.  Review of Systems: Pertinent positive and negative review of systems were noted in the above HPI section. All other review of systems were otherwise negative.  Current Medications, Allergies, Past Medical History, Past Surgical History, Family History and Social History were reviewed in [employer] record.  Physical Exam: General: Well developed, well nourished, no acute distress Head: Normocephalic and atraumatic Eyes:  sclerae anicteric, EOMI Ears: Normal auditory acuity Mouth: No deformity or lesions Neck: Supple, no masses or thyromegaly Lungs: Clear throughout to auscultation Heart: Regular rate and rhythm; no murmurs, rubs or bruits Chest: Markedly tenderness along her lower anterior ribs Abdomen: Soft, non tender and non distended. No masses, hepatosplenomegaly or hernias noted. Normal Bowel sounds Musculoskeletal:  Symmetrical with no gross deformities  Skin: No lesions on visible extremities Pulses:  Normal pulses noted Extremities: No clubbing, cyanosis, edema or deformities noted Neurological: Alert oriented x 4, grossly nonfocal Cervical Nodes:  No significant cervical adenopathy Inguinal Nodes: No significant inguinal adenopathy Psychological:  Alert and cooperative. Normal mood and affect  Assessment and Recommendations:  1. Chest pain, epigastric pain. Musculoskeletal pain, possibly costochondritis. Begin Advil 600 mg 3 times a day for now. Further evaluation, follow-up and management per her PCP.  2. GERD. Begin omeprazole 40 mg daily and standard antireflux measures. Schedule EGD. The risks (including bleeding, perforation, infection, missed lesions, medication reactions and possible hospitalization or surgery if complications occur), benefits, and alternatives to endoscopy with possible biopsy and possible dilation were discussed with the patient and they consent to proceed.   3. Normocytic anemia with low normal MCV=79. CBC, Fe, TIBC, ferritin, B12, folate.   4. Elevated AST. LFTs. Consider abd [place] if symptoms persist and LFTs remain elevated.   5. Personal history of adenomatous colon polyps. Five-year interval surveillance colonoscopy recommended in 12/2016.  6. SOB. Evaluation with PCP.   cc: Nelle Don, PA-C [address], [place] [id number]

## 2016-06-29 ENCOUNTER — Encounter: Payer: Self-pay | Admitting: Gastroenterology

## 2016-07-13 ENCOUNTER — Ambulatory Visit (AMBULATORY_SURGERY_CENTER): Payer: BLUE CROSS/BLUE SHIELD | Admitting: Gastroenterology

## 2016-07-13 ENCOUNTER — Encounter: Payer: Self-pay | Admitting: Gastroenterology

## 2016-07-13 VITALS — BP 154/85 | HR 72 | Temp 96.6°F | Resp 20 | Ht 63.0 in | Wt 140.0 lb

## 2016-07-13 DIAGNOSIS — R079 Chest pain, unspecified: Secondary | ICD-10-CM | POA: Diagnosis not present

## 2016-07-13 DIAGNOSIS — K219 Gastro-esophageal reflux disease without esophagitis: Secondary | ICD-10-CM | POA: Diagnosis not present

## 2016-07-13 DIAGNOSIS — R1013 Epigastric pain: Secondary | ICD-10-CM

## 2016-07-13 DIAGNOSIS — K2950 Unspecified chronic gastritis without bleeding: Secondary | ICD-10-CM

## 2016-07-13 MED ORDER — SODIUM CHLORIDE 0.9 % IV SOLN: 500.0000 mL | INTRAVENOUS | Status: DC

## 2016-07-13 NOTE — Progress Notes (Signed)
Report to PACU, RN, vss, BBS= Clear.  

## 2016-07-13 NOTE — Patient Instructions (Signed)
Discharge instructions given. Handouts on hiatal hernia and gastritis. Resume previous medications. Patient instructed to pick up prescription previously sent in to pharmacy. YOU HAD AN ENDOSCOPIC PROCEDURE TODAY AT [facility]:   Refer to the procedure report that was given to you for any specific questions about what was found during the examination.  If the procedure report does not answer your questions, please call your gastroenterologist to clarify.  If you requested that your care partner not be given the details of your procedure findings, then the procedure report has been included in a sealed envelope for you to review at your convenience later.  YOU SHOULD EXPECT: Some feelings of bloating in the abdomen. Passage of more gas than usual.  Walking can help get rid of the air that was put into your GI tract during the procedure and reduce the bloating. If you had a lower endoscopy (such as a colonoscopy or flexible sigmoidoscopy) you may notice spotting of blood in your stool or on the toilet paper. If you underwent a bowel prep for your procedure, you may not have a normal bowel movement for a few days.  Please Note:  You might notice some irritation and congestion in your nose or some drainage.  This is from the oxygen used during your procedure.  There is no need for concern and it should clear up in a day or so.  SYMPTOMS TO REPORT IMMEDIATELY:    Following upper endoscopy (EGD)  Vomiting of blood or coffee ground material  New chest pain or pain under the shoulder blades  Painful or persistently difficult swallowing  New shortness of breath  Fever of 100F or higher  Black, tarry-looking stools  For urgent or emergent issues, a gastroenterologist can be reached at any hour by calling [phone].   DIET:  We do recommend a small meal at first, but then you may proceed to your regular diet.  Drink plenty of fluids but you should avoid alcoholic beverages for  24 hours.  ACTIVITY:  You should plan to take it easy for the rest of today and you should NOT DRIVE or use heavy machinery until tomorrow (because of the sedation medicines used during the test).    FOLLOW UP: Our staff will call the number listed on your records the next business day following your procedure to check on you and address any questions or concerns that you may have regarding the information given to you following your procedure. If we do not reach you, we will leave a message.  However, if you are feeling well and you are not experiencing any problems, there is no need to return our call.  We will assume that you have returned to your regular daily activities without incident.  If any biopsies were taken you will be contacted by phone or by letter within the next 1-3 weeks.  Please call us at [phone] if you have not heard about the biopsies in 3 weeks.    SIGNATURES/CONFIDENTIALITY: You and/or your care partner have signed paperwork which will be entered into your electronic medical record.  These signatures attest to the fact that that the information above on your After Visit Summary has been reviewed and is understood.  Full responsibility of the confidentiality of this discharge information lies with you and/or your care-partner.

## 2016-07-13 NOTE — Op Note (Signed)
[facility] Patient Name: Mariah Foster Procedure Date: 07/13/2016 10:40 AM MRN: [id number] Endoscopist: Ladene Artist , MD Age: 55 Referring MD:  Date of Birth: 03-14-1961 Gender: Female Account #: [id number] Procedure:                Upper GI endoscopy Indications:              Epigastric abdominal pain, Suspected                            gastro-esophageal reflux disease, Unexplained chest                            pain Medicines:                Monitored Anesthesia Care Procedure:                Pre-Anesthesia Assessment:                           - Prior to the procedure, a History and Physical                            was performed, and patient medications and                            allergies were reviewed. The patient's tolerance of                            previous anesthesia was also reviewed. The risks                            and benefits of the procedure and the sedation                            options and risks were discussed with the patient.                            All questions were answered, and informed consent                            was obtained. Prior Anticoagulants: The patient has                            taken no previous anticoagulant or antiplatelet                            agents. ASA Grade Assessment: II - A patient with                            mild systemic disease. After reviewing the risks                            and benefits, the patient was deemed in  satisfactory condition to undergo the procedure.                           After obtaining informed consent, the endoscope was                            passed under direct vision. Throughout the                            procedure, the patient's blood pressure, pulse, and                            oxygen saturations were monitored continuously. The                            Model GIF-[id number]) scope was introduced                     through the mouth, and advanced to the second part                            of duodenum. The upper GI endoscopy was                            accomplished without difficulty. The patient                            tolerated the procedure well. Scope In: Scope Out: Findings:                 The examined esophagus was normal.                           A few localized, small non-bleeding erosions were                            found in the gastric fundus and in the gastric                            body. Two were linear associated with the hiatal                            hernia. There were no stigmata of recent bleeding.                            Biopsies were taken with a cold forceps for                            histology.                           A small hiatal hernia was present.                           The exam of the stomach was otherwise normal.  The duodenal bulb and second portion of the                            duodenum were normal. Complications:            No immediate complications. Estimated Blood Loss:     Estimated blood loss: none. Impression:               - Normal esophagus.                           - Non-bleeding erosive gastropathy.                           - Small hiatal hernia.                           - Normal duodenal bulb and second portion of the                            duodenum.                           - No specimens collected. Recommendation:           - Patient has a contact number available for                            emergencies. The signs and symptoms of potential                            delayed complications were discussed with the                            patient. Return to normal activities tomorrow.                            Written discharge instructions were provided to the                            patient.                           - Resume previous diet.                            - Continue present medications.                           - No aspirin, ibuprofen, naproxen, or other                            non-steroidal anti-inflammatory drugs.                           - Await pathology results.                           - Return to primary  care physician as previously                            scheduled. Ladene Artist, MD 07/13/2016 10:57:48 AM This report has been signed electronically.

## 2016-07-16 ENCOUNTER — Telehealth: Payer: Self-pay | Admitting: *Deleted

## 2016-07-16 NOTE — Telephone Encounter (Signed)
  Follow up Call-  Call back number 07/13/2016  Post procedure Call Back phone  # [phone] cell  Permission to leave phone message Yes  Some recent data might be hidden     Patient questions:  Do you have a fever, pain , or abdominal swelling? No. Pain Score  0 *  Have you tolerated food without any problems? Yes.    Have you been able to return to your normal activities? Yes.    Do you have any questions about your discharge instructions: Diet   No. Medications  No. Follow up visit  No.  Do you have questions or concerns about your Care? Yes.    Actions: * If pain score is 4 or above: Physician/ provider Notified : Lucio Edward, MD.  Pt complained of burning sensation around her sides, pt thought it was kidneys, reviewed report from EGD on 07/13/16, biopsies where taken from gastric erosions, she was not complaining of any epigastric pain or chest discomfort, I advised her she may want to see her primary care about new burning sensation, this did not seem like the same pain she was here for, do you have any other ideas or want to her? pls adv thanks adm

## 2016-07-17 NOTE — Telephone Encounter (Signed)
Her new symptoms are not related to EGD. Follow up with her PCP

## 2016-07-18 NOTE — Telephone Encounter (Signed)
Noted, no additional instructions.-adm

## 2016-07-24 ENCOUNTER — Encounter: Payer: Self-pay | Admitting: Gastroenterology

## 2016-08-03 ENCOUNTER — Encounter: Payer: Self-pay | Admitting: Gastroenterology

## 2016-11-28 ENCOUNTER — Encounter: Payer: Self-pay | Admitting: Gastroenterology

## 2017-03-14 ENCOUNTER — Emergency Department (HOSPITAL_COMMUNITY): Payer: BLUE CROSS/BLUE SHIELD

## 2017-03-14 ENCOUNTER — Emergency Department (HOSPITAL_COMMUNITY)
Admission: EM | Admit: 2017-03-14 | Discharge: 2017-03-14 | Disposition: A | Discharge status: Home / Self Care | Payer: BLUE CROSS/BLUE SHIELD | Attending: Emergency Medicine | Admitting: Emergency Medicine

## 2017-03-14 ENCOUNTER — Encounter (HOSPITAL_COMMUNITY): Payer: Self-pay | Admitting: Emergency Medicine

## 2017-03-14 DIAGNOSIS — M62838 Other muscle spasm: Secondary | ICD-10-CM | POA: Diagnosis not present

## 2017-03-14 DIAGNOSIS — I10 Essential (primary) hypertension: Secondary | ICD-10-CM | POA: Diagnosis not present

## 2017-03-14 DIAGNOSIS — Z79899 Other long term (current) drug therapy: Secondary | ICD-10-CM | POA: Insufficient documentation

## 2017-03-14 DIAGNOSIS — D649 Anemia, unspecified: Secondary | ICD-10-CM | POA: Diagnosis not present

## 2017-03-14 DIAGNOSIS — F17210 Nicotine dependence, cigarettes, uncomplicated: Secondary | ICD-10-CM | POA: Diagnosis not present

## 2017-03-14 LAB — CBC WITH DIFFERENTIAL/PLATELET
BASOS ABS: 0 10*3/uL (ref 0.0–0.1)
BASOS PCT: 0 %
EOS PCT: 3 %
Eosinophils Absolute: 0.2 10*3/uL (ref 0.0–0.7)
HCT: 30.2 % — ABNORMAL LOW (ref 36.0–46.0)
Hemoglobin: 8.8 g/dL — ABNORMAL LOW (ref 12.0–15.0)
Lymphocytes Relative: 37 %
Lymphs Abs: 2.1 10*3/uL (ref 0.7–4.0)
MCH: 21.9 pg — ABNORMAL LOW (ref 26.0–34.0)
MCHC: 29.1 g/dL — ABNORMAL LOW (ref 30.0–36.0)
MCV: 75.3 fL — AB (ref 78.0–100.0)
MONO ABS: 0.4 10*3/uL (ref 0.1–1.0)
Monocytes Relative: 7 %
NEUTROS ABS: 3 10*3/uL (ref 1.7–7.7)
NEUTROS PCT: 53 %
PLATELETS: 469 10*3/uL — AB (ref 150–400)
RBC: 4.01 MIL/uL (ref 3.87–5.11)
RDW: 17 % — AB (ref 11.5–15.5)
WBC: 5.7 10*3/uL (ref 4.0–10.5)

## 2017-03-14 LAB — BASIC METABOLIC PANEL
ANION GAP: 8 (ref 5–15)
BUN: 19 mg/dL (ref 6–20)
CHLORIDE: 105 mmol/L (ref 101–111)
CO2: 26 mmol/L (ref 22–32)
Calcium: 9.1 mg/dL (ref 8.9–10.3)
Creatinine, Ser: 0.83 mg/dL (ref 0.44–1.00)
GFR calc Af Amer: >60 mL/min (ref >60)
Glucose, Bld: 105 mg/dL — ABNORMAL HIGH (ref 65–99)
Potassium: 3.3 mmol/L — ABNORMAL LOW (ref 3.5–5.1)
SODIUM: 139 mmol/L (ref 135–145)

## 2017-03-14 LAB — URINALYSIS, ROUTINE W REFLEX MICROSCOPIC
Bilirubin Urine: NEGATIVE
GLUCOSE, UA: NEGATIVE mg/dL
Hgb urine dipstick: NEGATIVE
Ketones, ur: NEGATIVE mg/dL
NITRITE: NEGATIVE
PROTEIN: 30 mg/dL — AB
SPECIFIC GRAVITY, URINE: 1.024 (ref 1.005–1.030)
pH: 6 (ref 5.0–8.0)

## 2017-03-14 LAB — CBG MONITORING, ED: Glucose-Capillary: 101 mg/dL — ABNORMAL HIGH (ref 65–99)

## 2017-03-14 LAB — POC OCCULT BLOOD, ED: Fecal Occult Bld: POSITIVE — AB

## 2017-03-14 MED ORDER — POTASSIUM CHLORIDE ER 10 MEQ PO TBCR: 10.0000 meq | EXTENDED_RELEASE_TABLET | Freq: Every day | ORAL | 0 refills | Status: DC

## 2017-03-14 NOTE — ED Notes (Signed)
Pt signature pad broken pt stable and ambulatory for discharge. States understanding discharge instructions

## 2017-03-14 NOTE — ED Notes (Signed)
Pt returned from xray

## 2017-03-14 NOTE — Discharge Instructions (Addendum)
Follow-up with her primary care doctor in the next 24-48 hours. If you do not want to see her primary care doctor he can follow-up with one of the referred clinics listed below. 80 to call and arrange for appointment in the next 2 days.   Follow-up with referred GI doctor to evaluate why her having blood present in stool. Need to call them tomorrow and arrange for an appointment to be seen.  Take the potassium pills as instructed as to help with low potassium levels. Do not take more than directed.   Return the emergency Department for any bleeding from her rectum, blood in her stool, weakness, lightheadedness, chest pain, difficulty breathing or any other worsening or concerning symptoms.  If you do not have a primary care doctor you see regularly, please you the list below. Please call them to arrange for follow-up.    No Primary Care Doctor Call [facility] Other agencies that provide inexpensive medical care    [facility]  [phone]    [facility]  [place]  [phone]    [facility]  [phone]    [facility]  [phone]    [facility]  [phone]

## 2017-03-14 NOTE — ED Provider Notes (Signed)
[facility] DEPT Provider Note   CSN: [id number] Arrival date & time: 03/14/17  1026     History   Chief Complaint Chief Complaint  Patient presents with  . Spasms    HPI Mariah Foster is a 56 y.o. female who presents with persistent generalized muscle spasms. Patient states that the muscle spasms have been throughout her entire body, most notably her abdomen, chest, breast, and back. Patient states that she has been spearing sitting generalized muscle spasms for the past 2 years. She has been evaluated by her primary care doctor who prescribed her Soma to help with the spasms. She states that she's been taking it appropriately. Patient states that she came to the emergency department today because she is tired of having the spasms and wants to know what is causing them and he stopped them. She denies any new or changes in symptoms. She states that she has them 2-3 times a day and they are all over her stomach, chest, back, or legs. She states the episodes last approximately 15 minutes before resolving. She denies any chest pain, abdominal pain, fever, nausea/vomiting, constipation/diarrhea, numbness/weakness of her arms or legs, speech difficulty, dizziness, lightheadedness.  The history is provided by the patient.    Past Medical History:  Diagnosis Date  . GI bleed   . Hyperlipidemia   . Hypertension   . Tubular adenoma of colon 2013    Patient Active Problem List   Diagnosis Date Noted  . HTN (hypertension) 12/29/2012    Past Surgical History:  Procedure Laterality Date  . CESAREAN SECTION  3329,5188   x 2    OB History    No data available       Home Medications    Prior to Admission medications   Medication Sig Start Date End Date Taking? Authorizing Provider  albuterol (PROVENTIL HFA;VENTOLIN HFA) 108 (90 Base) MCG/ACT inhaler Inhale 1-2 puffs into the lungs every 6 (six) hours as needed for wheezing or shortness of breath.   Yes [provider]    amLODipine (NORVASC) 5 MG tablet Take 5 mg by mouth daily.   Yes [provider]  carisoprodol (SOMA) 350 MG tablet Take 175-375 mg by mouth at bedtime as needed for muscle spasms.   Yes [provider]  omeprazole (PRILOSEC) 40 MG capsule Take 1 capsule (40 mg total) by mouth daily. Patient taking differently: Take 20 mg by mouth daily.  06/13/16  Yes Ladene Artist, MD  valsartan (DIOVAN) 160 MG tablet Take 160 mg by mouth daily.   Yes [provider]  cyclobenzaprine (FLEXERIL) 10 MG tablet Take 1 tablet (10 mg total) by mouth 2 (two) times daily as needed for muscle spasms. Patient not taking: Reported on 07/13/2016 08/13/15   Hedges, Dellis Filbert, PA-C  HYDROcodone-acetaminophen (NORCO) 5-325 MG tablet Take 1 tablet by mouth every 6 (six) hours as needed for severe pain. Patient not taking: Reported on 07/13/2016 09/24/15   Sherwood Gambler, MD  hydrocortisone cream 1 % Apply to affected area 2 times daily Patient not taking: Reported on 07/13/2016 11/27/15   Arlean Hopping C, PA-C  potassium chloride (K-DUR) 10 MEQ tablet Take 1 tablet (10 mEq total) by mouth daily. 03/14/17   Volanda Napoleon, PA-C    Family History Family History  Problem Relation Age of Onset  . Heart attack Father   . Diabetes Sister        x 2  . Diabetes Brother     Social History Social  History  Substance Use Topics  . Smoking status: Current Every Day Smoker    Packs/day: 0.20    Types: Cigarettes  . Smokeless tobacco: Never Used  . Alcohol use Yes     Comment: socially     Allergies   Flagyl [metronidazole] and Metronidazole hcl   Review of Systems Review of Systems  Constitutional: Negative for chills and fever.  HENT: Negative for congestion, rhinorrhea and sore throat.   Eyes: Negative for visual disturbance.  Respiratory: Negative for cough and shortness of breath.   Cardiovascular: Negative for chest pain.  Gastrointestinal: Negative for abdominal pain, blood in stool,  diarrhea, nausea and vomiting.  Genitourinary: Negative for dysuria and hematuria.  Musculoskeletal: Negative for back pain and neck pain.       +Generalized muscle spasms  Skin: Negative for rash.  Neurological: Negative for dizziness, weakness, numbness and headaches.  Psychiatric/Behavioral: Negative for confusion.  All other systems reviewed and are negative.    Physical Exam Updated Vital Signs BP (!) 171/95 (BP Location: Left Arm)   Pulse 81   Temp 97.9 F (36.6 C) (Oral)   Resp 20   SpO2 100%   Physical Exam  Constitutional: She appears well-developed and well-nourished.  Sitting comfortably on examination table  HENT:  Head: Normocephalic and atraumatic.  Eyes: Conjunctivae and EOM are normal. Right eye exhibits no discharge. Left eye exhibits no discharge. No scleral icterus.  Pulmonary/Chest: Effort normal.  The exam was performed with a chaperone present. Breasts are symmetrical. No inversion the nipples bilaterally. No nipple discharge. No masses or tenderness felt.  Re-Exam shows patient is having tenderness palpation to the right lateral chest wall.  Genitourinary:  Genitourinary Comments: The exam was performed with a chaperone present. No rectal mass or fluctuance. No external hemorrhoids. No gross blood.  Musculoskeletal: She exhibits no deformity.  Neurological: She is alert.  Cranial nerves III-XII intact Follows commands, Moves all extremities  5/5 strength to BUE and BLE  Sensation intact throughout  Normal finger to nose. No dysdiadochokinesia. No pronator drift. No slurred speech. No facial droop.   Skin: Skin is warm and dry.  Psychiatric: She has a normal mood and affect. Her speech is normal and behavior is normal.  Nursing note and vitals reviewed.    ED Treatments / Results  Labs (all labs ordered are listed, but only abnormal results are displayed) Labs Reviewed  URINALYSIS, ROUTINE W REFLEX MICROSCOPIC - Abnormal; Notable for the  following:       Result Value   Protein, ur 30 (*)    Leukocytes, UA TRACE (*)    Bacteria, UA RARE (*)    Squamous Epithelial / LPF 0-5 (*)    All other components within normal limits  BASIC METABOLIC PANEL - Abnormal; Notable for the following:    Potassium 3.3 (*)    Glucose, Bld 105 (*)    All other components within normal limits  CBC WITH DIFFERENTIAL/PLATELET - Abnormal; Notable for the following:    Hemoglobin 8.8 (*)    HCT 30.2 (*)    MCV 75.3 (*)    MCH 21.9 (*)    MCHC 29.1 (*)    RDW 17.0 (*)    Platelets 469 (*)    All other components within normal limits  CBG MONITORING, ED - Abnormal; Notable for the following:    Glucose-Capillary 101 (*)    All other components within normal limits  POC OCCULT BLOOD, ED - Abnormal; Notable for the following:  Fecal Occult Bld POSITIVE (*)    All other components within normal limits    EKG  EKG Interpretation None       Radiology Dg Chest 2 View  Result Date: 03/14/2017 CLINICAL DATA:  Cough and chills. EXAM: CHEST  2 VIEW COMPARISON:  02/20/2013 FINDINGS: The cardiac silhouette, mediastinal and hilar contours are normal and stable. The lungs are clear of acute process. There are emphysematous changes and areas of compressive atelectasis but no infiltrates, edema or effusions. IMPRESSION: Emphysematous changes but no acute pulmonary findings. Electronically Signed   By: Marijo Sanes M.D.   On: 03/14/2017 14:17    Procedures Procedures (including critical care time)  Medications Ordered in ED Medications - No data to display   Initial Impression / Assessment and Plan / ED Course  I have reviewed the triage vital signs and the nursing notes.  Pertinent labs & imaging results that were available during my care of the patient were reviewed by me and considered in my medical decision making (see chart for details).     56 year old female who presents with 2 years of persistent generalized muscle spasms. Comes  today to find out what is causing them and how to fix them. She is currently on Soma to help with the spasms. Any changes in symptoms. Consider electrolyte imbalance hypoglycemia. Will check basic labs including CBC, BMP, UA.  1:51 PM: Reevaluation. Patient reports she is having some spasm/soreness to the right lateral chest wall. She has some tenderness to palpation on exam. Will obtain chest x-ray to evaluate for rib fracture. She is still denying any chest pain.  Labs reviewed. CBG within normal limits. BMP shows low potassium. CBC shows H&H of 8.8 and 30.2. Records reviewed. Shows that patient normally has a hemoglobin of 10. Urine positive for trace leuks in her bacteria but squamous epithelium shows likely contaminate. Patient has no dysuria or hematuria complaints. Discussed CBC with patient. She denies having any vaginal bleeding or rectal bleeding. She has not had any dizziness or lightheadedness. Will plan to Hemoccult evaluate for occult blood.  Rectal exam as documented above. No gross blood noted on exam.  Fecal occult blood positive. Patient states that her last colonoscopy was in January 2018 and revealed no abnormalities. Records reviewed and shows that patient's last colonoscopy documented in her chart was 2013. At that time she had internal hemorrhoids and one adenomatous polyp but did not have removed. Records show that she is scheduled for a repeat colonoscopy in March 2018. Discussed with patient and she states that she has not had any rectal or vaginal bleeding. She denies any vomiting, lightheadedness, difficulty breathing. Patient has been in bleeding in the department without difficulty. Given that patient's is stable she can follow-up with GI on an outpatient basis for further colonoscopy evaluation and evaluation of fecal occult blood. Patient states that she has been previously seen by  [facility] but she did not want to follow-up with them. Patient was instructed to either follow  up with her GI doctor or with the GI doctor provided in referall. We'll plan to provide patient with a short course of oral potassium to help with potassium replacement. Strict return precautions discussed. Patient expresses understanding and agreement to plan.   Final Clinical Impressions(s) / ED Diagnoses   Final diagnoses:  Muscle spasm  Anemia, unspecified type    New Prescriptions Discharge Medication List as of 03/14/2017  4:03 PM    START taking these medications   Details  potassium chloride (K-DUR) 10 MEQ tablet Take 1 tablet (10 mEq total) by mouth daily., Starting Thu 03/14/2017, Print         Volanda Napoleon, PA-C 03/14/17 2229    Davonna Belling, MD 03/15/17 [phone]

## 2017-03-14 NOTE — ED Triage Notes (Signed)
Pt sts "muscle spasms" with hx of same to abd and breast area as well as generalized

## 2017-04-26 ENCOUNTER — Other Ambulatory Visit: Payer: Self-pay | Admitting: Physician Assistant

## 2017-04-26 DIAGNOSIS — Z1231 Encounter for screening mammogram for malignant neoplasm of breast: Secondary | ICD-10-CM

## 2017-05-05 ENCOUNTER — Emergency Department (HOSPITAL_COMMUNITY)
Admission: EM | Admit: 2017-05-05 | Discharge: 2017-05-05 | Disposition: A | Discharge status: Home / Self Care | Payer: BLUE CROSS/BLUE SHIELD | Attending: Physician Assistant | Admitting: Physician Assistant

## 2017-05-05 ENCOUNTER — Encounter (HOSPITAL_COMMUNITY): Payer: Self-pay | Admitting: Emergency Medicine

## 2017-05-05 DIAGNOSIS — K0889 Other specified disorders of teeth and supporting structures: Secondary | ICD-10-CM | POA: Diagnosis not present

## 2017-05-05 DIAGNOSIS — I10 Essential (primary) hypertension: Secondary | ICD-10-CM | POA: Insufficient documentation

## 2017-05-05 DIAGNOSIS — K029 Dental caries, unspecified: Secondary | ICD-10-CM

## 2017-05-05 DIAGNOSIS — Z79899 Other long term (current) drug therapy: Secondary | ICD-10-CM | POA: Diagnosis not present

## 2017-05-05 DIAGNOSIS — F17210 Nicotine dependence, cigarettes, uncomplicated: Secondary | ICD-10-CM | POA: Insufficient documentation

## 2017-05-05 MED ORDER — OXYCODONE-ACETAMINOPHEN 5-325 MG PO TABS
ORAL_TABLET | ORAL | Status: AC
  Filled 2017-05-05: qty 1

## 2017-05-05 MED ORDER — HYDROCODONE-ACETAMINOPHEN 5-325 MG PO TABS: 1.0000 | ORAL_TABLET | Freq: Four times a day (QID) | ORAL | 0 refills | Status: DC | PRN

## 2017-05-05 MED ORDER — PENICILLIN V POTASSIUM 500 MG PO TABS: 500.0000 mg | ORAL_TABLET | Freq: Four times a day (QID) | ORAL | 0 refills | Status: AC

## 2017-05-05 MED ORDER — OXYCODONE-ACETAMINOPHEN 5-325 MG PO TABS
1.0000 | ORAL_TABLET | ORAL | Status: DC | PRN
  Administered 2017-05-05: 1 via ORAL

## 2017-05-05 NOTE — Discharge Instructions (Signed)
Please read instructions below. Take the antibiotic, Penicillin V, 4 times per day until they are gone. You can take Norco every 6 hours as needed for severe pain. Schedule an appointment with a dentist, using the dental resource guide attached. Return to the ER for difficulty swallowing or breathing, fever, or new or worsening symptoms.

## 2017-05-05 NOTE — ED Provider Notes (Signed)
[facility] DEPT Provider Note   CSN: [id number] Arrival date & time: 05/05/17  1320  By signing my name below, I, Collene Leyden, attest that this documentation has been prepared under the direction and in the presence of Martinique Russo, PA-C. Electronically Signed: Collene Leyden, Scribe. 05/05/17. 3:09 PM.  History   Chief Complaint Chief Complaint  Patient presents with  . Dental Pain   HPI Comments: Mariah Foster is a 56 y.o. female with a history of hypertension, who presents to the Emergency Department complaining of sudden-onset, constant right lower dental pain that began three weeks ago. Patient reports gradually worsening, throbbing dental pain for several weeks. Patient does not have a primary dentist. No additional symptoms noted. No medications taken prior to arrival. Patient states her pain is worse with swallowing, cold air, and eating. Nothing improves her pain. Patient denies any allergies to antibiotics. Patient denies trouble swallowing, sore throat, facial swelling, difficulty breathing, purulent drainage, fever, chills, nausea, or vomiting.   The history is provided by the patient. No language interpreter was used.    Past Medical History:  Diagnosis Date  . GI bleed   . Hyperlipidemia   . Hypertension   . Tubular adenoma of colon 2013    Patient Active Problem List   Diagnosis Date Noted  . HTN (hypertension) 12/29/2012    Past Surgical History:  Procedure Laterality Date  . CESAREAN SECTION  2951,8841   x 2    OB History    No data available       Home Medications    Prior to Admission medications   Medication Sig Start Date End Date Taking? Authorizing Provider  albuterol (PROVENTIL HFA;VENTOLIN HFA) 108 (90 Base) MCG/ACT inhaler Inhale 1-2 puffs into the lungs every 6 (six) hours as needed for wheezing or shortness of breath.    [provider]  amLODipine (NORVASC) 5 MG tablet Take 5 mg by mouth daily.    [provider]    carisoprodol (SOMA) 350 MG tablet Take 175-375 mg by mouth at bedtime as needed for muscle spasms.    [provider]  cyclobenzaprine (FLEXERIL) 10 MG tablet Take 1 tablet (10 mg total) by mouth 2 (two) times daily as needed for muscle spasms. Patient not taking: Reported on 07/13/2016 08/13/15   Hedges, Dellis Filbert, PA-C  HYDROcodone-acetaminophen (NORCO/VICODIN) 5-325 MG tablet Take 1 tablet by mouth every 6 (six) hours as needed for severe pain. 05/05/17   Russo, Martin[place] N, PA-C  hydrocortisone cream 1 % Apply to affected area 2 times daily Patient not taking: Reported on 07/13/2016 11/27/15   Lorayne Bender, PA-C  omeprazole (PRILOSEC) 40 MG capsule Take 1 capsule (40 mg total) by mouth daily. Patient taking differently: Take 20 mg by mouth daily.  06/13/16   Ladene Artist, MD  penicillin v potassium (VEETID) 500 MG tablet Take 1 tablet (500 mg total) by mouth 4 (four) times daily. 05/05/17 05/12/17  Russo, Martin[place] N, PA-C  potassium chloride (K-DUR) 10 MEQ tablet Take 1 tablet (10 mEq total) by mouth daily. 03/14/17   Volanda Napoleon, PA-C  valsartan (DIOVAN) 160 MG tablet Take 160 mg by mouth daily.    [provider]    Family History Family History  Problem Relation Age of Onset  . Heart attack Father   . Diabetes Sister        x 2  . Diabetes Brother     Social History Social History  Substance Use Topics  .  Smoking status: Current Every Day Smoker    Packs/day: 0.20    Types: Cigarettes  . Smokeless tobacco: Never Used  . Alcohol use Yes     Comment: socially     Allergies   Flagyl [metronidazole] and Metronidazole hcl   Review of Systems Review of Systems  Constitutional: Negative for chills and fever.  HENT: Positive for dental problem. Negative for facial swelling, sore throat and trouble swallowing.   Respiratory:       No difficulty breathing  Gastrointestinal: Negative for nausea and vomiting.     Physical Exam Updated Vital Signs BP (!)  161/103 (BP Location: Right Arm) Comment: PT did not take BP meds today but plans to take when she is home.  Pulse 86   Temp 98.6 F (37 C) (Oral)   Resp 18   Ht 5\' 4"  (1.626 m)   Wt 63.5 kg (140 lb)   SpO2 100%   BMI 24.03 kg/m   Physical Exam  Constitutional: She appears well-developed and well-nourished. No distress.  Tolerating secretions.   HENT:  Head: Normocephalic and atraumatic.  Right Ear: External ear normal.  Left Ear: External ear normal.  Mouth/Throat: Uvula is midline and oropharynx is clear and moist. No trismus in the jaw. Dental caries present. No uvula swelling. No oropharyngeal exudate.    Poor dentition. No evidence of fluctuant abscess. No purulent drainage. Right lower molar is broken and tender. No facial swelling.   Eyes: Conjunctivae are normal.  Neck: Normal range of motion. Neck supple.  Cardiovascular: Normal rate.   Pulmonary/Chest: Effort normal. No stridor.  Lymphadenopathy:    She has no cervical adenopathy.  Psychiatric: She has a normal mood and affect. Her behavior is normal.  Nursing note and vitals reviewed.    ED Treatments / Results  DIAGNOSTIC STUDIES: Oxygen Saturation is 100% on RA, normal by my interpretation.    COORDINATION OF CARE: 3:08 PM Discussed treatment plan with pt at bedside and pt agreed to plan, which includes antibiotics.   Labs (all labs ordered are listed, but only abnormal results are displayed) Labs Reviewed - No data to display  EKG  EKG Interpretation None       Radiology No results found.  Procedures Procedures (including critical care time)  Medications Ordered in ED Medications  oxyCODONE-acetaminophen (PERCOCET/ROXICET) 5-325 MG per tablet 1 tablet (1 tablet Oral Given 05/05/17 1408)  oxyCODONE-acetaminophen (PERCOCET/ROXICET) 5-325 MG per tablet (not administered)     Initial Impression / Assessment and Plan / ED Course  I have reviewed the triage vital signs and the nursing  notes.  Pertinent labs & imaging results that were available during my care of the patient were reviewed by me and considered in my medical decision making (see chart for details).     Patient with dental caries.  No gross abscess.  VSS, afebrile, tolerating secretions. Exam unconcerning for Ludwig's angina or spread of infection.  Will treat with penicillin and pain medicine.  Urged patient to follow-up with dentist. Pt safe for discharge.  [place] Controlled Substance reporting System queried  Discussed results, findings, treatment and follow up. Patient advised of return precautions. Patient verbalized understanding and agreed with plan.  Final Clinical Impressions(s) / ED Diagnoses   Final diagnoses:  Dental caries    New Prescriptions Discharge Medication List as of 05/05/2017  3:18 PM    START taking these medications   Details  penicillin v potassium (VEETID) 500 MG tablet Take 1 tablet (500 mg  total) by mouth 4 (four) times daily., Starting Sun 05/05/2017, Until Sun 05/12/2017, Print       I personally performed the services described in this documentation, which was scribed in my presence. The recorded information has been reviewed and is accurate.    Russo, Martinique N, PA-C 05/05/17 1726    Russo, Martinique N, PA-C 05/05/17 1727    Macarthur Critchley, MD 05/05/17 [phone]

## 2017-05-05 NOTE — ED Triage Notes (Signed)
Pt c/o right lower toothache x 3 weeks

## 2017-05-05 NOTE — ED Notes (Signed)
Declined W/C at D/C and was escorted to lobby by RN. 

## 2017-05-06 ENCOUNTER — Emergency Department (HOSPITAL_COMMUNITY)
Admission: EM | Admit: 2017-05-06 | Discharge: 2017-05-06 | Disposition: A | Discharge status: Home / Self Care | Payer: BLUE CROSS/BLUE SHIELD | Attending: Emergency Medicine | Admitting: Emergency Medicine

## 2017-05-06 ENCOUNTER — Encounter (HOSPITAL_COMMUNITY): Payer: Self-pay | Admitting: Emergency Medicine

## 2017-05-06 DIAGNOSIS — I10 Essential (primary) hypertension: Secondary | ICD-10-CM | POA: Insufficient documentation

## 2017-05-06 DIAGNOSIS — F17210 Nicotine dependence, cigarettes, uncomplicated: Secondary | ICD-10-CM | POA: Insufficient documentation

## 2017-05-06 DIAGNOSIS — K0889 Other specified disorders of teeth and supporting structures: Secondary | ICD-10-CM | POA: Diagnosis present

## 2017-05-06 DIAGNOSIS — Z79899 Other long term (current) drug therapy: Secondary | ICD-10-CM | POA: Diagnosis not present

## 2017-05-06 MED ORDER — OXYCODONE-ACETAMINOPHEN 5-325 MG PO TABS
1.0000 | ORAL_TABLET | Freq: Once | ORAL | Status: AC
  Administered 2017-05-06: 1 via ORAL

## 2017-05-06 MED ORDER — IBUPROFEN 400 MG PO TABS
600.0000 mg | ORAL_TABLET | Freq: Once | ORAL | Status: AC
  Administered 2017-05-06: 600 mg via ORAL
  Filled 2017-05-06: qty 1

## 2017-05-06 MED ORDER — OXYCODONE-ACETAMINOPHEN 5-325 MG PO TABS
ORAL_TABLET | ORAL | Status: AC
  Administered 2017-05-06: 1 via ORAL
  Filled 2017-05-06: qty 1

## 2017-05-06 MED ORDER — IBUPROFEN 600 MG PO TABS: 600.0000 mg | ORAL_TABLET | Freq: Four times a day (QID) | ORAL | 0 refills | Status: DC | PRN

## 2017-05-06 NOTE — Discharge Instructions (Signed)
Medications: ibuprofen  Treatment: In addition to the Vicodin prescribed earlier, you can take ibuprofen every 6 hours as needed for your pain. You can also use over-the-counter Orajel. Continue taking your penicillin as prescribed.  Follow-up: It is important to follow up with a dentist as soon as possible. I have attached another option below in addition to the many other resources attached and given to you earlier. Please return to the emergency department if you develop any new or worsening symptoms.

## 2017-05-06 NOTE — ED Triage Notes (Signed)
Patient reports chronic right lower molar pain for 3 weeks , she was seen here yesterday prescribed with oral antibiotic .

## 2017-05-06 NOTE — ED Provider Notes (Signed)
[facility] DEPT Provider Note   CSN: [id number] Arrival date & time: 05/06/17  6767     History   Chief Complaint Chief Complaint  Patient presents with  . Dental Pain    HPI Mariah Foster is a 56 y.o. female with history of hypertension, hyperlipidemia who presents with a three-week history of right lower dental pain. Patient was evaluated earlier yesterday for same symptoms. Patient continues with ongoing throbbing pain. She reports she has been taking the penicillin and Norco given without relief. Patient describes a mild soreness in her throat, but she states she has not drank very much at all lately. Patient reports no difficulty swallowing. She also denies any fevers. She does not have a dentist.  HPI  Past Medical History:  Diagnosis Date  . GI bleed   . Hyperlipidemia   . Hypertension   . Tubular adenoma of colon 2013    Patient Active Problem List   Diagnosis Date Noted  . HTN (hypertension) 12/29/2012    Past Surgical History:  Procedure Laterality Date  . CESAREAN SECTION  2094,7096   x 2    OB History    No data available       Home Medications    Prior to Admission medications   Medication Sig Start Date End Date Taking? Authorizing Provider  albuterol (PROVENTIL HFA;VENTOLIN HFA) 108 (90 Base) MCG/ACT inhaler Inhale 1-2 puffs into the lungs every 6 (six) hours as needed for wheezing or shortness of breath.    [provider]  amLODipine (NORVASC) 5 MG tablet Take 5 mg by mouth daily.    [provider]  carisoprodol (SOMA) 350 MG tablet Take 175-375 mg by mouth at bedtime as needed for muscle spasms.    [provider]  cyclobenzaprine (FLEXERIL) 10 MG tablet Take 1 tablet (10 mg total) by mouth 2 (two) times daily as needed for muscle spasms. Patient not taking: Reported on 07/13/2016 08/13/15   Hedges, Dellis Filbert, PA-C  HYDROcodone-acetaminophen (NORCO/VICODIN) 5-325 MG tablet Take 1 tablet by mouth every 6 (six) hours  as needed for severe pain. 05/05/17   Russo, Martin[place] N, PA-C  hydrocortisone cream 1 % Apply to affected area 2 times daily Patient not taking: Reported on 07/13/2016 11/27/15   Joy, Helane Gunther, PA-C  ibuprofen (ADVIL,MOTRIN) 600 MG tablet Take 1 tablet (600 mg total) by mouth every 6 (six) hours as needed. 05/06/17   Sally-Anne Wamble, Bea Graff, PA-C  omeprazole (PRILOSEC) 40 MG capsule Take 1 capsule (40 mg total) by mouth daily. Patient taking differently: Take 20 mg by mouth daily.  06/13/16   Ladene Artist, MD  penicillin v potassium (VEETID) 500 MG tablet Take 1 tablet (500 mg total) by mouth 4 (four) times daily. 05/05/17 05/12/17  Russo, Martinique N, PA-C  potassium chloride (K-DUR) 10 MEQ tablet Take 1 tablet (10 mEq total) by mouth daily. 03/14/17   Volanda Napoleon, PA-C  valsartan (DIOVAN) 160 MG tablet Take 160 mg by mouth daily.    [provider]    Family History Family History  Problem Relation Age of Onset  . Heart attack Father   . Diabetes Sister        x 2  . Diabetes Brother     Social History Social History  Substance Use Topics  . Smoking status: Current Every Day Smoker    Packs/day: 0.20    Types: Cigarettes  . Smokeless tobacco: Never Used  . Alcohol use Yes  Comment: socially     Allergies   Flagyl [metronidazole] and Metronidazole hcl   Review of Systems Review of Systems  Constitutional: Negative for fever.  HENT: Positive for dental problem.      Physical Exam Updated Vital Signs BP (!) 166/112 (BP Location: Left Arm)   Pulse 73   Temp 98.1 F (36.7 C) (Oral)   Resp 18   SpO2 96%   Physical Exam  Constitutional: She appears well-developed and well-nourished. No distress.  HENT:  Head: Normocephalic and atraumatic.  Mouth/Throat: Oropharynx is clear and moist. No trismus in the jaw. Abnormal dentition. No dental abscesses or uvula swelling. No oropharyngeal exudate, posterior oropharyngeal edema or posterior oropharyngeal erythema.     Overall poor dentition with evidence of several past tooth extractions. No tenderness to palpation to the floor the mouth, no submandibular tenderness, tenderness to the right jaw  Eyes: Conjunctivae are normal. Pupils are equal, round, and reactive to light. Right eye exhibits no discharge. Left eye exhibits no discharge. No scleral icterus.  Neck: Normal range of motion. Neck supple. No thyromegaly present.  Cardiovascular: Normal rate, regular rhythm, normal heart sounds and intact distal pulses.  Exam reveals no gallop and no friction rub.   No murmur heard. Pulmonary/Chest: Effort normal and breath sounds normal. No stridor. No respiratory distress. She has no wheezes. She has no rales.  Abdominal: Soft. Bowel sounds are normal. She exhibits no distension. There is no tenderness. There is no rebound and no guarding.  Musculoskeletal: She exhibits no edema.  Lymphadenopathy:    She has no cervical adenopathy.  Neurological: She is alert. Coordination normal.  Skin: Skin is warm and dry. No rash noted. She is not diaphoretic. No pallor.  Psychiatric: She has a normal mood and affect.  Nursing note and vitals reviewed.    ED Treatments / Results  Labs (all labs ordered are listed, but only abnormal results are displayed) Labs Reviewed - No data to display  EKG  EKG Interpretation None       Radiology No results found.  Procedures Procedures (including critical care time)  Medications Ordered in ED Medications  ibuprofen (ADVIL,MOTRIN) tablet 600 mg (not administered)  oxyCODONE-acetaminophen (PERCOCET/ROXICET) 5-325 MG per tablet 1 tablet (1 tablet Oral Given 05/06/17 0340)     Initial Impression / Assessment and Plan / ED Course  I have reviewed the triage vital signs and the nursing notes.  Pertinent labs & imaging results that were available during my care of the patient were reviewed by me and considered in my medical decision making (see chart for details).      Patient with returning with dentalgia.  No abscess requiring immediate incision and drainage.  Exam not concerning for Ludwig's angina or pharyngeal abscess.  Patient given 1 dose of Percocet and ibuprofen in the ED. Will continue treatment with penicillin, Norco prescribed by prior provider and will add ibuprofen. Pt instructed to follow-up with dentist as soon as possible. She is advised that this will be the definitive treatment for her dental pain. Patient given additional dental resources. She reports she will call today. Discussed return precautions. Patient vitals stable throughout ED course discharged in satisfactory condition. I discussed patient case with Dr. Leonides Schanz who agrees with plan.    Final Clinical Impressions(s) / ED Diagnoses   Final diagnoses:  Pain, dental    New Prescriptions New Prescriptions   IBUPROFEN (ADVIL,MOTRIN) 600 MG TABLET    Take 1 tablet (600 mg total) by mouth every  6 (six) hours as needed.     Frederica Kuster, PA-C 05/06/17 Greenback, Delice Bison, DO 05/06/17 1518

## 2017-06-07 ENCOUNTER — Ambulatory Visit
Admission: RE | Admit: 2017-06-07 | Discharge: 2017-06-07 | Disposition: A | Discharge status: Home / Self Care | Payer: BLUE CROSS/BLUE SHIELD | Source: Ambulatory Visit | Attending: Physician Assistant | Admitting: Physician Assistant

## 2017-06-07 DIAGNOSIS — Z1231 Encounter for screening mammogram for malignant neoplasm of breast: Secondary | ICD-10-CM

## 2017-10-01 ENCOUNTER — Telehealth: Payer: Self-pay | Admitting: Gastroenterology

## 2017-10-01 NOTE — Telephone Encounter (Signed)
Patient scheduled with APP Anderson Malta 12.14.18

## 2017-10-01 NOTE — Telephone Encounter (Signed)
Received urgent referral for patient to be seen for PUD and anemia due to chronic blood loss.Dr. Fuller Plan patient last seen 8.16.17. Referral placed on nurse Sheri's desk to advise on scheduling.

## 2017-10-01 NOTE — Telephone Encounter (Signed)
Can see APP at next available opening, Dr. Fuller Plan does not have any openings for the next several weeks

## 2017-10-11 ENCOUNTER — Ambulatory Visit: Payer: BLUE CROSS/BLUE SHIELD | Admitting: Physician Assistant

## 2017-10-11 ENCOUNTER — Encounter: Payer: Self-pay | Admitting: Physician Assistant

## 2017-10-11 ENCOUNTER — Encounter: Payer: Self-pay | Admitting: Gastroenterology

## 2017-10-11 ENCOUNTER — Encounter (INDEPENDENT_AMBULATORY_CARE_PROVIDER_SITE_OTHER): Payer: Self-pay

## 2017-10-11 ENCOUNTER — Telehealth: Payer: Self-pay | Admitting: Gastroenterology

## 2017-10-11 VITALS — BP 112/68 | HR 64 | Ht 64.0 in | Wt 142.0 lb

## 2017-10-11 DIAGNOSIS — Z86010 Personal history of colonic polyps: Secondary | ICD-10-CM

## 2017-10-11 DIAGNOSIS — K219 Gastro-esophageal reflux disease without esophagitis: Secondary | ICD-10-CM

## 2017-10-11 DIAGNOSIS — D509 Iron deficiency anemia, unspecified: Secondary | ICD-10-CM

## 2017-10-11 MED ORDER — NA SULFATE-K SULFATE-MG SULF 17.5-3.13-1.6 GM/177ML PO SOLN: 1.0000 | ORAL | 0 refills | Status: DC

## 2017-10-11 MED ORDER — OMEPRAZOLE 40 MG PO CPDR: 40.0000 mg | DELAYED_RELEASE_CAPSULE | Freq: Every day | ORAL | 3 refills | Status: DC

## 2017-10-11 NOTE — Telephone Encounter (Signed)
Informed patient we do not PA's on prep kits because they are never covered by the insurance companies. Informed patient I will contact pharmacy and fax a coupon to them to give her a discount. Patient verbalized understanding. Called pharmacy and told them I was faxing coupon for Suprep on this patient and to fill the prescription afterwards. [facility] states they will call the patient when the rx is filled.

## 2017-10-11 NOTE — Patient Instructions (Signed)
Please purchase the following medications over the counter and take as directed: Iron 325 mg twice a day  Increase Omeprazole 40 mg twice a day  You have been scheduled for an endoscopy and colonoscopy. Please follow the written instructions given to you at your visit today. Please pick up your prep supplies at the pharmacy within the next 1-3 days. If you use inhalers (even only as needed), please bring them with you on the day of your procedure. Your physician has requested that you go to www.startemmi.com and enter the access code given to you at your visit today. This web site gives a general overview about your procedure. However, you should still follow specific instructions given to you by our office regarding your preparation for the procedure.

## 2017-10-11 NOTE — Progress Notes (Signed)
Chief Complaint: History of PUD, anemia  HPI:    [name] is a 56 year old [place] female, with a past medical history as listed below, who was referred to me by Mariah Don, PA-C for a complaint of anemia with a history of PUD.      Patient follows with Dr. Fuller Plan and was last seen in clinic 06/13/16.  At that time, the patient described a low anterior chest pain that was worsened by wearing a bra.  She also related worsening problems with reflux including heartburn and regurgitation.  She had previously been treated with antireflux medications for reflux but had not been on any recently.  She had recently been evaluated in the [facility] ED for similar complaints and had a CT which showed no acute abnormalities.  Blood work showed a normocytic anemia with hemoglobin of 10.9.  Symptoms were felt to be musculoskeletal.  Patient's last colonoscopy was March 2013 which showed one adenomatous polyp and internal hemorrhoids.  At that time patient was started on Advil 600 mg 3 times a day she was also started on Omeprazole 40 mg daily and scheduled for an EGD.  It was recommended she have a repeat colonoscopy 12/2016 for her 5-year surveillance for adenomatous polyps.    EGD 07/13/16 revealed a few localized, nonbleeding erosions in the gastric fundus and gastric body as well as a small hiatal hernia.    Patient was seen in the ED 09/23/17 and was found to be anemic with a hemoglobin around 6.  This was rechecked 09/30/17 and was 9.2 after 2 units of PRBCs given in the ER, MCV low at 67.  Hemoccult was negative at that time.    Today, the patient presents to clinic accompanied by her ex-husband and is a very poor historian, she tells me that she has been feeling more tired than normal lately.  She went to the ER above and was then told to follow with our clinic.  The patient does not know any history about PUD other than having an EGD in our clinic and being told that she had some "sores".  Patient is on  Omeprazole 40 mg daily and tells me she has no reflux or heartburn symptoms.  She denies any abdominal pain today.  Patient does tell me that her bowel habits alternate between regular and somewhat constipated.  She does discuss that sometimes she has to strain to have a bowel movement.  Patient tells me she never looks at her stool, so she is unaware of what color they are.  She has not been started on iron at this time.    Patient denies fever, chills, weight loss, anorexia, nausea, vomiting, heartburn or reflux.  Past Medical History:  Diagnosis Date  . GI bleed   . Hyperlipidemia   . Hypertension   . Tubular adenoma of colon 2013    Past Surgical History:  Procedure Laterality Date  . CESAREAN SECTION  2993,7169   x 2    Current Outpatient Medications  Medication Sig Dispense Refill  . amoxicillin-clavulanate (AUGMENTIN) 875-125 MG tablet Take by mouth.    Marland Kitchen albuterol (PROVENTIL HFA;VENTOLIN HFA) 108 (90 Base) MCG/ACT inhaler Inhale 1-2 puffs into the lungs every 6 (six) hours as needed for wheezing or shortness of breath.    Marland Kitchen amLODipine (NORVASC) 5 MG tablet Take 5 mg by mouth daily.    . carisoprodol (SOMA) 350 MG tablet Take 175-375 mg by mouth at bedtime as needed for muscle spasms.    Marland Kitchen  cyclobenzaprine (FLEXERIL) 10 MG tablet Take 1 tablet (10 mg total) by mouth 2 (two) times daily as needed for muscle spasms. (Patient not taking: Reported on 07/13/2016) 20 tablet 0  . HYDROcodone-acetaminophen (NORCO/VICODIN) 5-325 MG tablet Take 1 tablet by mouth every 6 (six) hours as needed for severe pain. 6 tablet 0  . hydrocortisone cream 1 % Apply to affected area 2 times daily (Patient not taking: Reported on 07/13/2016) 15 g 0  . ibuprofen (ADVIL,MOTRIN) 600 MG tablet Take 1 tablet (600 mg total) by mouth every 6 (six) hours as needed. 30 tablet 0  . omeprazole (PRILOSEC) 40 MG capsule Take 1 capsule (40 mg total) by mouth daily. (Patient taking differently: Take 20 mg by mouth daily. )  30 capsule 11  . potassium chloride (K-DUR) 10 MEQ tablet Take 1 tablet (10 mEq total) by mouth daily. 7 tablet 0  . valsartan (DIOVAN) 160 MG tablet Take 160 mg by mouth daily.     Current Facility-Administered Medications  Medication Dose Route Frequency Provider Last Rate Last Dose  . 0.9 %  sodium chloride infusion  500 mL Intravenous Continuous Ladene Artist, MD        Allergies as of 10/11/2017 - Review Complete 05/06/2017  Allergen Reaction Noted  . Flagyl [metronidazole]  06/13/2016  . Metronidazole hcl Hives 01/04/2012    Family History  Problem Relation Age of Onset  . Heart attack Father   . Diabetes Sister        x 2  . Diabetes Brother     Social History   Socioeconomic History  . Marital status: Single    Spouse name: Not on file  . Number of children: 2  . Years of education: Not on file  . Highest education level: Not on file  Social Needs  . Financial resource strain: Not on file  . Food insecurity - worry: Not on file  . Food insecurity - inability: Not on file  . Transportation needs - medical: Not on file  . Transportation needs - non-medical: Not on file  Occupational History  . Occupation: Arboriculturist  Tobacco Use  . Smoking status: Current Every Day Smoker    Packs/day: 0.20    Types: Cigarettes  . Smokeless tobacco: Never Used  Substance and Sexual Activity  . Alcohol use: Yes    Comment: socially  . Drug use: No  . Sexual activity: No  Other Topics Concern  . Not on file  Social History Narrative  . Not on file    Review of Systems:    Constitutional: No weight loss, fever or chills Skin: No rash  Cardiovascular: No chest pain Respiratory: No SOB  Gastrointestinal: See HPI and otherwise negative Genitourinary: No dysuria  Neurological: No headache, dizziness or syncope Musculoskeletal: No new muscle or joint pain Hematologic: No bleeding  Psychiatric: No history of depression or anxiety   Physical Exam:  Vital  signs: BP 112/68   Pulse 64   Ht 5\' 4"  (1.626 m)   Wt 142 lb (64.4 kg)   BMI 24.37 kg/m    Constitutional:   Pleasant AA female appears to be in NAD, Well developed, Well nourished, alert and cooperative Head:  Normocephalic and atraumatic. Eyes:   PEERL, EOMI. No icterus. Conjunctiva pink. Ears:  Normal auditory acuity. Neck:  Supple Throat: Oral cavity and pharynx without inflammation, swelling or lesion. Poor dentition Respiratory: Respirations even and unlabored. Lungs clear to auscultation bilaterally.   No wheezes, crackles, or rhonchi.  Cardiovascular: Normal S1, S2. No MRG. Regular rate and rhythm. No peripheral edema, cyanosis or pallor.  Gastrointestinal:  Soft, nondistended, mild generalized ttp, worse in Epigastrum, No rebound or guarding. Normal bowel sounds. No appreciable masses or hepatomegaly. Rectal:  Not performed.  Msk:  Symmetrical without gross deformities. Without edema, no deformity or joint abnormality.  Neurologic:  Alert and  oriented x4;  grossly normal neurologically.  Skin:   Dry and intact without significant lesions or rashes. Psychiatric:  Demonstrates good judgement and reason without abnormal affect or behaviors.  See HPI for most recent labs and imaging.  Assessment: 1.  IDA: Most recently the end of November had a hemoglobin around 6, received 2 units of PRBCs and hemoglobin is at 9.1 now with a low MCV, previous history of erosions in the gastric body and fundus, history of tubular adenomas and overdue for a colonoscopy; consider lower versus upper GI source of blood loss 2.  History of adenomatous polyps: Last colonoscopy in 2013 with a finding of adenomatous colon polyps, repeat was recommended in 5 years, she is overdue since March of this year 3.  GERD: Controlled on Omeprazole 40mg  qd  Plan: 1.  Start Iron 325 mg p.o. twice daily 2.  Discussed that if the patient becomes constipated with above, she can use MiraLAX or stool softener if  needed. 3.  Scheduled patient for an EGD and colonoscopy in [place] with Dr. Fuller Plan.  Did discuss risks, benefits, limitations and alternatives and the patient agrees to proceed. 4.  Patient's Omeprazole was increased to 40 mg twice daily, 30-60 minutes before eating breakfast, refilled prescription #60 with 1 refill 5.  Patient to follow in clinical per recommendations after procedure as above.  She was given strict ER precautions and verbalized understanding.  Ellouise Newer, PA-C [facility] 10/11/2017, 2:12 PM  Cc: Mariah Don, PA-C

## 2017-10-11 NOTE — Telephone Encounter (Signed)
Left a message for patient to call our office back.  

## 2017-10-14 NOTE — Progress Notes (Signed)
Reviewed and agree with management plan.  Bryahna Lesko T. Aly Hauser, MD FACG 

## 2017-10-15 ENCOUNTER — Ambulatory Visit (AMBULATORY_SURGERY_CENTER): Payer: BLUE CROSS/BLUE SHIELD | Admitting: Gastroenterology

## 2017-10-15 ENCOUNTER — Encounter: Payer: Self-pay | Admitting: Gastroenterology

## 2017-10-15 VITALS — BP 147/83 | HR 64 | Temp 97.1°F | Resp 19 | Ht 64.0 in | Wt 142.0 lb

## 2017-10-15 DIAGNOSIS — D125 Benign neoplasm of sigmoid colon: Secondary | ICD-10-CM

## 2017-10-15 DIAGNOSIS — D126 Benign neoplasm of colon, unspecified: Secondary | ICD-10-CM | POA: Diagnosis not present

## 2017-10-15 DIAGNOSIS — R195 Other fecal abnormalities: Secondary | ICD-10-CM | POA: Diagnosis not present

## 2017-10-15 DIAGNOSIS — D128 Benign neoplasm of rectum: Secondary | ICD-10-CM | POA: Diagnosis not present

## 2017-10-15 DIAGNOSIS — K3189 Other diseases of stomach and duodenum: Secondary | ICD-10-CM

## 2017-10-15 DIAGNOSIS — D508 Other iron deficiency anemias: Secondary | ICD-10-CM | POA: Diagnosis present

## 2017-10-15 DIAGNOSIS — Z86010 Personal history of colonic polyps: Secondary | ICD-10-CM

## 2017-10-15 DIAGNOSIS — K635 Polyp of colon: Secondary | ICD-10-CM | POA: Diagnosis not present

## 2017-10-15 MED ORDER — SODIUM CHLORIDE 0.9 % IV SOLN: 500.0000 mL | Freq: Once | INTRAVENOUS | Status: DC

## 2017-10-15 MED ORDER — IRON 325 (65 FE) MG PO TABS: 325.0000 mg | ORAL_TABLET | Freq: Two times a day (BID) | ORAL | 5 refills | Status: DC

## 2017-10-15 NOTE — Patient Instructions (Signed)
YOU HAD AN ENDOSCOPIC PROCEDURE TODAY AT [facility]:   Refer to the procedure report that was given to you for any specific questions about what was found during the examination.  If the procedure report does not answer your questions, please call your gastroenterologist to clarify.  If you requested that your care partner not be given the details of your procedure findings, then the procedure report has been included in a sealed envelope for you to review at your convenience later.  YOU SHOULD EXPECT: Some feelings of bloating in the abdomen. Passage of more gas than usual.  Walking can help get rid of the air that was put into your GI tract during the procedure and reduce the bloating. If you had a lower endoscopy (such as a colonoscopy or flexible sigmoidoscopy) you may notice spotting of blood in your stool or on the toilet paper. If you underwent a bowel prep for your procedure, you may not have a normal bowel movement for a few days.  Please Note:  You might notice some irritation and congestion in your nose or some drainage.  This is from the oxygen used during your procedure.  There is no need for concern and it should clear up in a day or so.  SYMPTOMS TO REPORT IMMEDIATELY:   Following lower endoscopy (colonoscopy or flexible sigmoidoscopy):  Excessive amounts of blood in the stool  Significant tenderness or worsening of abdominal pains  Swelling of the abdomen that is new, acute  Fever of 100F or higher   Following upper endoscopy (EGD)  Vomiting of blood or coffee ground material  New chest pain or pain under the shoulder blades  Painful or persistently difficult swallowing  New shortness of breath  Fever of 100F or higher  Black, tarry-looking stools  For urgent or emergent issues, a gastroenterologist can be reached at any hour by calling [phone].   DIET:  We do recommend a small meal at first, but then you may proceed to your regular diet.  Drink  plenty of fluids but you should avoid alcoholic beverages for 24 hours.  ACTIVITY:  You should plan to take it easy for the rest of today and you should NOT DRIVE or use heavy machinery until tomorrow (because of the sedation medicines used during the test).    FOLLOW UP: Our staff will call the number listed on your records the next business day following your procedure to check on you and address any questions or concerns that you may have regarding the information given to you following your procedure. If we do not reach you, we will leave a message.  However, if you are feeling well and you are not experiencing any problems, there is no need to return our call.  We will assume that you have returned to your regular daily activities without incident.  If any biopsies were taken you will be contacted by phone or by letter within the next 1-3 weeks.  Please call us at [phone] if you have not heard about the biopsies in 3 weeks.    SIGNATURES/CONFIDENTIALITY: You and/or your care partner have signed paperwork which will be entered into your electronic medical record.  These signatures attest to the fact that that the information above on your After Visit Summary has been reviewed and is understood.  Full responsibility of the confidentiality of this discharge information lies with you and/or your care-partner.    Handouts were given to your care partner on polyps  and hemorrhoids. Iron 325 mg take twice a day for 6 months was sent to your pharmacy. No aspirin, ibuprofen, naproxen, or other non-inflammatory drugs. You may resume your other current medications today. Await biopsy results. Return to GI office in 2 months.  The office will call you with this appointment. Please call if any questions or concerns.

## 2017-10-15 NOTE — Progress Notes (Signed)
Report given to PACU, vss 

## 2017-10-15 NOTE — Progress Notes (Signed)
Called to room to assist during endoscopic procedure.  Patient ID and intended procedure confirmed with present staff. Received instructions for my participation in the procedure from the performing physician.  

## 2017-10-15 NOTE — Progress Notes (Signed)
No problems noted in the recovery room. maw 

## 2017-10-15 NOTE — Op Note (Addendum)
[place] Patient Name: Mariah Foster Procedure Date: 10/15/2017 2:41 PM MRN: [id number] Endoscopist: Ladene Artist , MD Age: 56 Referring MD:  Date of Birth: [birth year] Gender: Female Account #: [id number] Procedure:                Colonoscopy Indications:              Heme positive stool, Iron deficiency anemia,                            personal history of adenomatous colon polyps. Medicines:                Monitored Anesthesia Care Procedure:                Pre-Anesthesia Assessment:                           - Prior to the procedure, a History and Physical                            was performed, and patient medications and                            allergies were reviewed. The patient's tolerance of                            previous anesthesia was also reviewed. The risks                            and benefits of the procedure and the sedation                            options and risks were discussed with the patient.                            All questions were answered, and informed consent                            was obtained. [Anticoagulant Agents] [VQQV Prior to                            [place]. [ASA Grade]. After reviewing the risks                            and benefits, the patient was deemed in                            satisfactory condition to undergo the procedure.                           - Prior to the procedure, a History and Physical                            was performed, and patient medications and  allergies were reviewed. The patient's tolerance of                            previous anesthesia was also reviewed. The risks                            and benefits of the procedure and the sedation                            options and risks were discussed with the patient.                            All questions were answered, and informed consent                            was obtained. Prior  Anticoagulants: The patient has                            taken no previous anticoagulant or antiplatelet                            agents. ASA Grade Assessment: II - A patient with                            mild systemic disease. After reviewing the risks                            and benefits, the patient was deemed in                            satisfactory condition to undergo the procedure.                           After obtaining informed consent, the colonoscope                            was passed under direct vision. Throughout the                            procedure, the patient's blood pressure, pulse, and                            oxygen saturations were monitored continuously. The                            Colonoscope was introduced through the anus and                            advanced to the the cecum, identified by                            appendiceal orifice and ileocecal valve. The  ileocecal valve, appendiceal orifice, and rectum                            were photographed. The quality of the bowel                            preparation was good. The colonoscopy was performed                            without difficulty. Unable to retroflex in the                            rectum due to a narrow rectal vault. The patient                            tolerated the procedure well. Scope In: 2:45:22 PM Scope Out: 3:00:15 PM Scope Withdrawal Time: 0 hours 12 minutes 39 seconds  Total Procedure Duration: 0 hours 14 minutes 53 seconds  Findings:                 The perianal and digital rectal examinations were                            normal.                           A 7 mm polyp was found in the sigmoid colon. The                            polyp was sessile. The polyp was removed with a                            cold snare. Resection and retrieval were complete.                           Two sessile polyps were found in the  rectum. The                            polyps were 4 mm in size. These polyps were removed                            with a cold biopsy forceps. Resection and retrieval                            were complete.                           A single small localized angiodysplastic lesion                            without bleeding was found at the hepatic flexure.                           There was a medium-sized lipoma, 15 mm in  diameter,                            in the descending colon.                           Internal hemorrhoids were found. The hemorrhoids                            were Grade I (internal hemorrhoids that do not                            prolapse).                           The exam was otherwise without abnormality on                            direct views. Complications:            No immediate complications. Estimated blood loss:                            None. Estimated Blood Loss:     Estimated blood loss: none. Impression:               - One 7 mm polyp in the sigmoid colon, removed with                            a cold snare. Resected and retrieved.                           - Two 4 mm polyps in the rectum, removed with a                            cold biopsy forceps. Resected and retrieved.                           - A single non-bleeding colonic angiodysplastic                            lesion.                           - Medium-sized lipoma in the descending colon.                           - Small internal hemorrhoids                           - The examination was otherwise normal on direct                            views. Recommendation:           - Repeat colonoscopy in 5 years for surveillance.                           -  Patient has a contact number available for                            emergencies. The signs and symptoms of potential                            delayed complications were discussed with the                             patient. Return to normal activities tomorrow.                            Written discharge instructions were provided to the                            patient.                           - Resume previous diet.                           - Continue present medications.                           - Await pathology results. Ladene Artist, MD 10/15/2017 3:13:41 PM This report has been signed electronically.

## 2017-10-15 NOTE — Op Note (Signed)
[facility] Patient Name: Mariah Foster Procedure Date: 10/15/2017 2:40 PM MRN: [id number] Endoscopist: Ladene Artist , MD Age: 56 Referring MD:  Date of Birth: [birth year] Gender: Female Account #: [id number] Procedure:                Upper GI endoscopy Indications:              Iron deficiency anemia, Heme positive stool Medicines:                Monitored Anesthesia Care Procedure:                Pre-Anesthesia Assessment:                           - Prior to the procedure, a History and Physical                            was performed, and patient medications and                            allergies were reviewed. The patient's tolerance of                            previous anesthesia was also reviewed. The risks                            and benefits of the procedure and the sedation                            options and risks were discussed with the patient.                            All questions were answered, and informed consent                            was obtained. Prior Anticoagulants: The patient has                            taken no previous anticoagulant or antiplatelet                            agents. ASA Grade Assessment: II - A patient with                            mild systemic disease. After reviewing the risks                            and benefits, the patient was deemed in                            satisfactory condition to undergo the procedure.                           After obtaining informed consent, the endoscope was  passed under direct vision. Throughout the                            procedure, the patient's blood pressure, pulse, and                            oxygen saturations were monitored continuously. The                            Endoscope was introduced through the mouth, and                            advanced to the fourth part of duodenum. The upper                            GI  endoscopy was accomplished without difficulty.                            The patient tolerated the procedure well. Scope In: Scope Out: 3:10:41 PM Findings:                 The examined esophagus was normal.                           A single 3 mm no bleeding angiodysplastic lesion                            was found on the lesser curvature of the stomach.                           The exam of the stomach was otherwise normal.                           Five 2 to 5 mm angiodysplastic lesions without                            bleeding were found in the duodenal bulb, in the                            second portion of the duodenum, in the third                            portion of the duodenum (2) and in the fourth                            portion of the duodenum.                           A single 6 mm mucosal nodule with a localized                            distribution was found in the duodenal bulb.  Biopsies were taken with a cold forceps for                            histology.                           The exam of the duodenum was otherwise normal. Complications:            No immediate complications. Estimated Blood Loss:     Estimated blood loss was minimal. Impression:               - Normal esophagus.                           - A single non-bleeding angiodysplastic lesion in                            the stomach.                           - Five non-bleeding angiodysplastic lesions in the                            duodenum.                           - Mucosal nodule found in the duodenum. Biopsied. Recommendation:           - Patient has a contact number available for                            emergencies. The signs and symptoms of potential                            delayed complications were discussed with the                            patient. Return to normal activities tomorrow.                            Written discharge  instructions were provided to the                            patient.                           - Resume previous diet.                           - Continue present medications.                           - Await pathology results.                           - FeSO4 325 mg po bid with meals, 6 month supply                           -  No aspirin, ibuprofen, naproxen, or other                            non-steroidal anti-inflammatory drugs.                           - Return to GI office in 2 months. Ladene Artist, MD 10/15/2017 3:19:16 PM This report has been signed electronically.

## 2017-10-15 NOTE — Progress Notes (Signed)
Pt's states no medical or surgical changes since previsit or office visit. 

## 2017-10-16 ENCOUNTER — Telehealth: Payer: Self-pay | Admitting: *Deleted

## 2017-10-16 NOTE — Telephone Encounter (Signed)
  Follow up Call-  Call back number 10/15/2017 07/13/2016  Post procedure Call Back phone  # [phone] cell  Permission to leave phone message Yes Yes  Some recent data might be hidden     Patient questions:  Do you have a fever, pain , or abdominal swelling? No. Pain Score  0 *  Have you tolerated food without any problems? Yes.    Have you been able to return to your normal activities? Yes.    Do you have any questions about your discharge instructions: Diet   No. Medications  No. Follow up visit  No.  Do you have questions or concerns about your Care? No.  Actions: * If pain score is 4 or above: No action needed, pain <4.

## 2017-10-17 ENCOUNTER — Telehealth: Payer: Self-pay | Admitting: Gastroenterology

## 2017-10-17 NOTE — Telephone Encounter (Signed)
Patient is on antibiotics for a tooth infection and is having vaginal burning.  She is advised to contact her provider that started her on antibiotics.

## 2017-10-25 ENCOUNTER — Encounter: Payer: Self-pay | Admitting: Gastroenterology

## 2017-11-06 ENCOUNTER — Other Ambulatory Visit: Payer: Self-pay

## 2017-11-06 ENCOUNTER — Emergency Department (HOSPITAL_COMMUNITY): Payer: BLUE CROSS/BLUE SHIELD

## 2017-11-06 ENCOUNTER — Emergency Department (HOSPITAL_COMMUNITY)
Admission: EM | Admit: 2017-11-06 | Discharge: 2017-11-06 | Disposition: A | Discharge status: Home / Self Care | Payer: BLUE CROSS/BLUE SHIELD | Attending: Emergency Medicine | Admitting: Emergency Medicine

## 2017-11-06 ENCOUNTER — Encounter (HOSPITAL_COMMUNITY): Payer: Self-pay | Admitting: Emergency Medicine

## 2017-11-06 DIAGNOSIS — J069 Acute upper respiratory infection, unspecified: Secondary | ICD-10-CM | POA: Insufficient documentation

## 2017-11-06 DIAGNOSIS — Z79899 Other long term (current) drug therapy: Secondary | ICD-10-CM | POA: Diagnosis not present

## 2017-11-06 DIAGNOSIS — R0981 Nasal congestion: Secondary | ICD-10-CM

## 2017-11-06 DIAGNOSIS — R05 Cough: Secondary | ICD-10-CM

## 2017-11-06 DIAGNOSIS — F17210 Nicotine dependence, cigarettes, uncomplicated: Secondary | ICD-10-CM | POA: Insufficient documentation

## 2017-11-06 DIAGNOSIS — R059 Cough, unspecified: Secondary | ICD-10-CM

## 2017-11-06 MED ORDER — AZITHROMYCIN 250 MG PO TABS: 250.0000 mg | ORAL_TABLET | Freq: Every day | ORAL | 0 refills | Status: DC

## 2017-11-06 MED ORDER — FLUTICASONE PROPIONATE 50 MCG/ACT NA SUSP: 2.0000 | Freq: Every day | NASAL | 0 refills | Status: DC

## 2017-11-06 MED ORDER — AMOXICILLIN-POT CLAVULANATE 875-125 MG PO TABS: 1.0000 | ORAL_TABLET | Freq: Two times a day (BID) | ORAL | 0 refills | Status: DC

## 2017-11-06 MED ORDER — BENZONATATE 100 MG PO CAPS: 100.0000 mg | ORAL_CAPSULE | Freq: Three times a day (TID) | ORAL | 0 refills | Status: DC

## 2017-11-06 NOTE — Discharge Instructions (Signed)
You were given a prescription for antibiotics which you should take to completion. You were also given Flonase and Benzonatate for your congestion and cough. Please follow up with your primary doctor within 3-5 days to evaluate your symptoms. Please return to the ER if you have any difficulty breathing, chest pain, persistent fevers, or any new or worsening symptoms.

## 2017-11-06 NOTE — ED Provider Notes (Signed)
[facility]PART[facility] Provider Note   CSN: [id number] Arrival date & time: 11/06/17  1707     History   Chief Complaint Chief Complaint  Patient presents with  . Cough    Chest Congestion   . Nasal Congestion    HPI Mariah Foster is a 57 y.o. female.  HPI 57 y/o female presents to the ED c/o a productive cough with yellow sputum for the last week. Is also reporting chest congestion, chest wall pain with cough across the bilat upper chest, nasal congestion, sinus pain, fevers (Tmax 100.76F), and HA. Denies any Sob or wheezing.  Denies chest pain when she does not cough.  Has used Tylneol sinus and nyquil for sxs and it has improved her sxs. Pt quit smoking 1 year ago. Denies a sore throat  Denies a h/o DVT, bilat calf swelling, erythema, pain, periods of immobility, recent surgery, personal or family history of blood clots, or h/o CA.   Past Medical History:  Diagnosis Date  . GI bleed   . Hyperlipidemia   . Hypertension   . Tubular adenoma of colon 2013    Patient Active Problem List   Diagnosis Date Noted  . HTN (hypertension) 12/29/2012    Past Surgical History:  Procedure Laterality Date  . CESAREAN SECTION  6295,[id number]   x 2    OB History    No data available       Home Medications    Prior to Admission medications   Medication Sig Start Date End Date Taking? Authorizing Provider  albuterol (PROVENTIL HFA;VENTOLIN HFA) 108 (90 Base) MCG/ACT inhaler Inhale 1-2 puffs into the lungs every 6 (six) hours as needed for wheezing or shortness of breath.    [provider]  amLODipine (NORVASC) 5 MG tablet Take 5 mg by mouth daily.    [provider]  amoxicillin-clavulanate (AUGMENTIN) 875-125 MG tablet Take 1 tablet by mouth every 12 (twelve) hours. 11/06/17   Kearah Gayden S, PA-C  azithromycin (ZITHROMAX) 250 MG tablet Take 1 tablet (250 mg total) by mouth daily. Take first 2 tablets together, then 1 every day until  finished. 11/06/17   Dain Laseter S, PA-C  benzonatate (TESSALON) 100 MG capsule Take 1 capsule (100 mg total) by mouth every 8 (eight) hours. 11/06/17   Amantha Sklar S, PA-C  carisoprodol (SOMA) 350 MG tablet Take 175-375 mg by mouth at bedtime as needed for muscle spasms.    [provider]  cyclobenzaprine (FLEXERIL) 10 MG tablet Take 1 tablet (10 mg total) by mouth 2 (two) times daily as needed for muscle spasms. 08/13/15   Hedges, Dellis Filbert, PA-C  Ferrous Sulfate (IRON) 325 (65 Fe) MG TABS Take 1 tablet (325 mg total) by mouth 2 (two) times daily. 10/15/17   Ladene Artist, MD  fluticasone (FLONASE) 50 MCG/ACT nasal spray Place 2 sprays into both nostrils daily. 11/06/17   Zoei Amison S, PA-C  HYDROcodone-acetaminophen (NORCO/VICODIN) 5-325 MG tablet Take 1 tablet by mouth every 6 (six) hours as needed for severe pain. 05/05/17   Robinson, [place] N, PA-C  hydrocortisone cream 1 % Apply to affected area 2 times daily Patient not taking: Reported on 07/13/2016 11/27/15   Joy, Helane Gunther, PA-C  ibuprofen (ADVIL,MOTRIN) 600 MG tablet Take 1 tablet (600 mg total) by mouth every 6 (six) hours as needed. 05/06/17   Law, Bea Graff, PA-C  omeprazole (PRILOSEC) 40 MG capsule Take 1 capsule (40 mg total) by mouth daily. Patient taking  differently: Take 20 mg by mouth daily.  06/13/16   Ladene Artist, MD  omeprazole (PRILOSEC) 40 MG capsule Take 1 capsule (40 mg total) by mouth daily. 10/11/17   Levin Erp, PA  potassium chloride (K-DUR) 10 MEQ tablet Take 1 tablet (10 mEq total) by mouth daily. 03/14/17   Volanda Napoleon, PA-C  valsartan (DIOVAN) 160 MG tablet Take 160 mg by mouth daily.    [provider]    Family History Family History  Problem Relation Age of Onset  . Heart attack Father   . Diabetes Sister        x 2  . Diabetes Brother     Social History Social History   Tobacco Use  . Smoking status: Current Some Day Smoker    Packs/day: 0.20    Types:  Cigarettes  . Smokeless tobacco: Never Used  Substance Use Topics  . Alcohol use: Yes    Comment: socially  . Drug use: No     Allergies   Flagyl [metronidazole] and Metronidazole hcl   Review of Systems Review of Systems  Constitutional: Positive for fever.  HENT: Positive for congestion, rhinorrhea and sinus pressure. Negative for ear pain and sore throat.   Eyes: Negative for pain and visual disturbance.  Respiratory: Positive for cough. Negative for shortness of breath and wheezing.   Cardiovascular: Negative for palpitations and leg swelling.       Chest wall pain with cough  Gastrointestinal: Negative for abdominal pain, constipation, diarrhea, nausea, rectal pain and vomiting.  Genitourinary: Negative for dysuria and hematuria.  Musculoskeletal: Negative for arthralgias and back pain.  Skin: Negative for color change and rash.  Neurological: Positive for headaches. Negative for seizures and syncope.  All other systems reviewed and are negative.    Physical Exam Updated Vital Signs BP (!) 161/80 (BP Location: Right Arm)   Pulse 77   Temp 98.2 F (36.8 C) (Oral)   Resp 16   Ht 5\' 4"  (1.626 m)   Wt 65.8 kg (145 lb)   SpO2 98%   BMI 24.89 kg/m   Physical Exam  Constitutional: She appears well-developed and well-nourished. No distress.  HENT:  Head: Normocephalic and atraumatic.  Right Ear: External ear normal.  Left Ear: External ear normal.  Mouth/Throat: Oropharynx is clear and moist.  bilat turbinates enlarged, no pharyngeal erythema or exudates, no tonsillar swelling, fluid behind bilat TMs with no bulging or erythema  Eyes: Conjunctivae and EOM are normal. Pupils are equal, round, and reactive to light.  Neck: Neck supple.  Cardiovascular: Normal rate, regular rhythm and normal heart sounds.  No murmur heard. Pulmonary/Chest: Effort normal and breath sounds normal. No stridor. No respiratory distress. She has no wheezes. She has no rales. Tenderness:  across bilat upper chest, reproduces her pain.  Tenderness across the bilateral anterior chest that reproduces her pain  Abdominal: Soft. Bowel sounds are normal. She exhibits no distension. There is no tenderness. There is no guarding.  Musculoskeletal: She exhibits no edema.  No calf tenderness, erythema, swelling  Lymphadenopathy:    She has no cervical adenopathy.  Neurological: She is alert.  Skin: Skin is warm and dry.  Psychiatric: She has a normal mood and affect.  Nursing note and vitals reviewed.    ED Treatments / Results  Labs (all labs ordered are listed, but only abnormal results are displayed) Labs Reviewed - No data to display  EKG  EKG Interpretation None  Radiology Dg Chest 2 View  Result Date: 11/06/2017 CLINICAL DATA:  Productive cough and congestion. EXAM: CHEST  2 VIEW COMPARISON:  03/14/2017 FINDINGS: Cardiomediastinal silhouette is normal. Mediastinal contours appear intact. Calcific atherosclerotic disease of the aorta. There is no evidence of focal airspace consolidation, pleural effusion or pneumothorax. Upper lobe predominant moderate emphysema. Osseous structures are without acute abnormality. Soft tissues are grossly normal. IMPRESSION: Upper lobe predominant moderate emphysema without evidence of focal airspace consolidation. Electronically Signed   By: Fidela Salisbury M.D.   On: 11/06/2017 20:33    Procedures Procedures (including critical care time)  Medications Ordered in ED Medications - No data to display   Initial Impression / Assessment and Plan / ED Course  I have reviewed the triage vital signs and the nursing notes.  Pertinent labs & imaging results that were available during my care of the patient were reviewed by me and considered in my medical decision making (see chart for details).   Evaluated the patient in the ED. Discussed CXR and diagnosis. Discussed plan for discharge on antibiotics with PCP f/u. Return precautions  given.   Final Clinical Impressions(s) / ED Diagnoses   Final diagnoses:  Upper respiratory tract infection, unspecified type  Nasal congestion  Cough   CXR with upper lobe predominant emphysema without evidence of focal airspace consolidation. No SOB or wheezing, pulmonary exam benign, and satting at 98% on RA. Prescribed antibiotics for possible early pneumonia versus bronchitis vs sinus infection. Low concern for ACS or PE. Wells score 0. Chest wall pain is reproducible with palpation and consistent with h/o coughing for 1 week. Verbalizes understanding and is agreeable with plan. Pt is hemodynamically stable & in NAD prior to dc.  HTN present but appears at baseline for pt. No evidence of EOD. Advised PCP f/u for this.  ED Discharge Orders        Ordered    azithromycin (ZITHROMAX) 250 MG tablet  Daily     11/06/17 2209    amoxicillin-clavulanate (AUGMENTIN) 875-125 MG tablet  Every 12 hours     11/06/17 2209    fluticasone (FLONASE) 50 MCG/ACT nasal spray  Daily     11/06/17 2210    benzonatate (TESSALON) 100 MG capsule  Every 8 hours     11/06/17 2210       Trentan Trippe S, PA-C 11/07/17 0350    Drenda Freeze, MD 11/08/17 1043

## 2017-11-06 NOTE — ED Triage Notes (Signed)
Patient reports persistent productive cough with nasal congestion and chest congestion for several days with mild headache , denies fever or chills .

## 2018-04-29 ENCOUNTER — Other Ambulatory Visit: Payer: Self-pay | Admitting: Physician Assistant

## 2018-04-29 DIAGNOSIS — Z1231 Encounter for screening mammogram for malignant neoplasm of breast: Secondary | ICD-10-CM

## 2018-06-09 ENCOUNTER — Ambulatory Visit: Payer: BLUE CROSS/BLUE SHIELD

## 2018-06-13 ENCOUNTER — Ambulatory Visit
Admission: RE | Admit: 2018-06-13 | Discharge: 2018-06-13 | Disposition: A | Discharge status: Home / Self Care | Payer: BLUE CROSS/BLUE SHIELD | Source: Ambulatory Visit | Attending: Physician Assistant | Admitting: Physician Assistant

## 2018-06-13 DIAGNOSIS — Z1231 Encounter for screening mammogram for malignant neoplasm of breast: Secondary | ICD-10-CM

## 2018-07-08 ENCOUNTER — Other Ambulatory Visit: Payer: Self-pay | Admitting: Physician Assistant

## 2018-09-11 ENCOUNTER — Other Ambulatory Visit: Payer: Self-pay | Admitting: Physician Assistant

## 2018-09-30 ENCOUNTER — Encounter: Payer: Self-pay | Admitting: Gastroenterology

## 2018-11-07 ENCOUNTER — Encounter: Payer: BLUE CROSS/BLUE SHIELD | Admitting: Gastroenterology

## 2018-12-04 ENCOUNTER — Encounter: Payer: Self-pay | Admitting: Physician Assistant

## 2018-12-04 ENCOUNTER — Encounter: Payer: Self-pay | Admitting: Gastroenterology

## 2019-01-09 ENCOUNTER — Encounter: Payer: Self-pay | Admitting: Gastroenterology

## 2019-05-11 ENCOUNTER — Other Ambulatory Visit: Payer: Self-pay | Admitting: Nurse Practitioner

## 2019-05-26 ENCOUNTER — Encounter (HOSPITAL_COMMUNITY): Payer: Self-pay

## 2019-05-26 ENCOUNTER — Ambulatory Visit (HOSPITAL_COMMUNITY)
Admission: EM | Admit: 2019-05-26 | Discharge: 2019-05-26 | Disposition: A | Discharge status: Home / Self Care | Payer: BLUE CROSS/BLUE SHIELD | Attending: Family Medicine | Admitting: Family Medicine

## 2019-05-26 ENCOUNTER — Other Ambulatory Visit: Payer: Self-pay

## 2019-05-26 DIAGNOSIS — R21 Rash and other nonspecific skin eruption: Secondary | ICD-10-CM | POA: Diagnosis present

## 2019-05-26 MED ORDER — ACYCLOVIR 400 MG PO TABS: 400.0000 mg | ORAL_TABLET | Freq: Three times a day (TID) | ORAL | 0 refills | Status: AC

## 2019-05-26 NOTE — Discharge Instructions (Addendum)
As we spoke about I believe this rash is related to the herpes virus. We will treat this with medication We will call you with your swab results in the next couple days. Follow up as needed for continued or worsening symptoms

## 2019-05-26 NOTE — ED Triage Notes (Signed)
Pt states she has a rash on her vaginal area. Pt states its been there for 1 week.

## 2019-05-27 ENCOUNTER — Encounter (INDEPENDENT_AMBULATORY_CARE_PROVIDER_SITE_OTHER): Payer: PRIVATE HEALTH INSURANCE | Admitting: Ophthalmology

## 2019-05-28 LAB — HSV CULTURE AND TYPING

## 2019-05-28 NOTE — ED Provider Notes (Signed)
[facility]    CSN: [id number] Arrival date & time: 05/26/19  1112     History   Chief Complaint Chief Complaint  Patient presents with  . Rash    HPI Mariah Foster is a 58 y.o. female.   Patient is a 58 year old female that presents today with rash to suprapubic area.  This is been there approximately 1 week.  Describes the rash as burning and painful.  Reporting some drainage from the area.  Denies any history or known history of HSV.  She is not currently sexually active.   Denies any fever, joint pain. Denies any recent changes in lotions, detergents, foods or other possible irritants. No recent travel. Nobody else at home has the rash. Patient has been outside but denies any contact with plants or insects. No new foods or medications.   ROS per HPI      Past Medical History:  Diagnosis Date  . GI bleed   . Hyperlipidemia   . Hypertension   . Tubular adenoma of colon 2013    Patient Active Problem List   Diagnosis Date Noted  . HTN (hypertension) 12/29/2012    Past Surgical History:  Procedure Laterality Date  . CESAREAN SECTION  9798,9211   x 2    OB History   No obstetric history on file.      Home Medications    Prior to Admission medications   Medication Sig Start Date End Date Taking? Authorizing Provider  acyclovir (ZOVIRAX) 400 MG tablet Take 1 tablet (400 mg total) by mouth 3 (three) times daily for 7 days. 05/26/19 06/02/19  Loura Halt A, NP  albuterol (PROVENTIL HFA;VENTOLIN HFA) 108 (90 Base) MCG/ACT inhaler Inhale 1-2 puffs into the lungs every 6 (six) hours as needed for wheezing or shortness of breath.    [provider]  amLODipine (NORVASC) 5 MG tablet Take 5 mg by mouth daily.    [provider]  carisoprodol (SOMA) 350 MG tablet Take 175-375 mg by mouth at bedtime as needed for muscle spasms.    [provider]  cyclobenzaprine (FLEXERIL) 10 MG tablet Take 1 tablet (10 mg total) by mouth 2 (two)  times daily as needed for muscle spasms. 08/13/15   Hedges, Dellis Filbert, PA-C  Ferrous Sulfate (IRON) 325 (65 Fe) MG TABS Take 1 tablet (325 mg total) by mouth 2 (two) times daily. 10/15/17   Ladene Artist, MD  fluticasone (FLONASE) 50 MCG/ACT nasal spray Place 2 sprays into both nostrils daily. 11/06/17   Couture, Cortni S, PA-C  omeprazole (PRILOSEC) 40 MG capsule TAKE 1 CAPSULE BY MOUTH ONCE DAILY 09/11/18   Levin Erp, PA  valsartan (DIOVAN) 160 MG tablet Take 160 mg by mouth daily.    [provider]  potassium chloride (K-DUR) 10 MEQ tablet Take 1 tablet (10 mEq total) by mouth daily. 03/14/17 05/26/19  Volanda Napoleon, PA-C    Family History Family History  Problem Relation Age of Onset  . Heart attack Father   . Diabetes Sister        x 2  . Diabetes Brother     Social History Social History   Tobacco Use  . Smoking status: Current Some Day Smoker    Packs/day: 0.20    Types: Cigarettes  . Smokeless tobacco: Never Used  Substance Use Topics  . Alcohol use: Yes    Comment: socially  . Drug use: No     Allergies   Flagyl [metronidazole] and  Metronidazole hcl   Review of Systems Review of Systems   Physical Exam Triage Vital Signs ED Triage Vitals  Enc Vitals Group     BP 05/26/19 1210 (!) 137/91     Pulse Rate 05/26/19 1210 81     Resp 05/26/19 1210 16     Temp 05/26/19 1210 98.1 F (36.7 C)     Temp Source 05/26/19 1210 Temporal     SpO2 05/26/19 1210 98 %     Weight 05/26/19 1220 150 lb (68 kg)     Height --      Head Circumference --      Peak Flow --      Pain Score 05/26/19 1219 4     Pain Loc --      Pain Edu? --      Excl. in [place]? --    No data found.  Updated Vital Signs BP (!) 137/91 (BP Location: Left Arm)   Pulse 81   Temp 98.1 F (36.7 C) (Temporal)   Resp 16   Wt 150 lb (68 kg)   SpO2 98%   BMI 25.75 kg/m   Visual Acuity Right Eye Distance:   Left Eye Distance:   Bilateral Distance:    Right Eye Near:    Left Eye Near:    Bilateral Near:     Physical Exam Vitals signs and nursing note reviewed.  Constitutional:      Appearance: Normal appearance.  HENT:     Head: Normocephalic and atraumatic.     Nose: Nose normal.     Mouth/Throat:     Pharynx: Oropharynx is clear.  Eyes:     Conjunctiva/sclera: Conjunctivae normal.  Neck:     Musculoskeletal: Normal range of motion.  Pulmonary:     Effort: Pulmonary effort is normal.  Musculoskeletal: Normal range of motion.  Skin:    General: Skin is warm and dry.     Findings: Rash present.     Comments: Cluster of pustular lesions to the mons pubis TTP    Neurological:     Mental Status: She is alert.  Psychiatric:        Mood and Affect: Mood normal.      UC Treatments / Results  Labs (all labs ordered are listed, but only abnormal results are displayed) Labs Reviewed  HSV CULTURE AND TYPING    EKG   Radiology No results found.  Procedures Procedures (including critical care time)  Medications Ordered in UC Medications - No data to display  Initial Impression / Assessment and Plan / UC Course  I have reviewed the triage vital signs and the nursing notes.  Pertinent labs & imaging results that were available during my care of the patient were reviewed by me and considered in my medical decision making (see chart for details).     Exam and rash consistent with HSV. We will go ahead and treat with acyclovir HSV swab sent for testing and labs pending Follow up as needed for continued or worsening symptoms  Final Clinical Impressions(s) / UC Diagnoses   Final diagnoses:  Rash     Discharge Instructions     As we spoke about I believe this rash is related to the herpes virus. We will treat this with medication We will call you with your swab results in the next couple days. Follow up as needed for continued or worsening symptoms     ED Prescriptions    Medication Sig Dispense Auth. Provider  acyclovir (ZOVIRAX) 400 MG tablet Take 1 tablet (400 mg total) by mouth 3 (three) times daily for 7 days. 21 tablet Loura Halt A, NP     Controlled Substance Prescriptions [place] Controlled Substance Registry consulted? Not Applicable   Orvan July, NP 05/28/19 [phone]

## 2019-05-29 ENCOUNTER — Telehealth (HOSPITAL_COMMUNITY): Payer: Self-pay | Admitting: Emergency Medicine

## 2019-05-29 NOTE — Telephone Encounter (Signed)
Culture positive for HSV 2, pt contacted and made aware, all questions answered.

## 2019-06-12 ENCOUNTER — Other Ambulatory Visit: Payer: Self-pay | Admitting: Nurse Practitioner

## 2019-06-12 DIAGNOSIS — Z1231 Encounter for screening mammogram for malignant neoplasm of breast: Secondary | ICD-10-CM

## 2019-07-01 ENCOUNTER — Ambulatory Visit (HOSPITAL_COMMUNITY)
Admission: EM | Admit: 2019-07-01 | Discharge: 2019-07-01 | Disposition: A | Discharge status: Home / Self Care | Payer: BLUE CROSS/BLUE SHIELD | Attending: Family Medicine | Admitting: Family Medicine

## 2019-07-01 ENCOUNTER — Encounter (HOSPITAL_COMMUNITY): Payer: Self-pay | Admitting: Family Medicine

## 2019-07-01 ENCOUNTER — Other Ambulatory Visit: Payer: Self-pay

## 2019-07-01 ENCOUNTER — Ambulatory Visit (INDEPENDENT_AMBULATORY_CARE_PROVIDER_SITE_OTHER): Payer: BLUE CROSS/BLUE SHIELD

## 2019-07-01 DIAGNOSIS — S46819A Strain of other muscles, fascia and tendons at shoulder and upper arm level, unspecified arm, initial encounter: Secondary | ICD-10-CM | POA: Insufficient documentation

## 2019-07-01 DIAGNOSIS — M546 Pain in thoracic spine: Secondary | ICD-10-CM | POA: Diagnosis not present

## 2019-07-01 DIAGNOSIS — Z20828 Contact with and (suspected) exposure to other viral communicable diseases: Secondary | ICD-10-CM | POA: Insufficient documentation

## 2019-07-01 DIAGNOSIS — R05 Cough: Secondary | ICD-10-CM | POA: Insufficient documentation

## 2019-07-01 DIAGNOSIS — R059 Cough, unspecified: Secondary | ICD-10-CM

## 2019-07-01 HISTORY — DX: Chronic obstructive pulmonary disease, unspecified: J44.9

## 2019-07-01 MED ORDER — ACETAMINOPHEN 500 MG PO TABS: 500.0000 mg | ORAL_TABLET | Freq: Four times a day (QID) | ORAL | 0 refills | Status: DC | PRN

## 2019-07-01 MED ORDER — CYCLOBENZAPRINE HCL 10 MG PO TABS: 10.0000 mg | ORAL_TABLET | Freq: Two times a day (BID) | ORAL | 0 refills | Status: DC | PRN

## 2019-07-01 NOTE — Discharge Instructions (Addendum)
Your chest x-ray was normal. I believe that your pain is most likely muscle soreness and overuse of the muscles from your job. I will give you a low-dose muscle relaxant to take to relax the muscles.  You can apply heat to the area for comfort. Tylenol extra strength every 6 hours as needed. And also testing you for COVID. We will keep you out of work until we get your results. You need to follow-up with your GI doctor  about the rectal bleeding.

## 2019-07-01 NOTE — ED Provider Notes (Signed)
[place]    CSN: [id number] Arrival date & time: 07/01/19  [id number]      History   Chief Complaint Chief Complaint  Patient presents with  . Back Pain    HPI Mariah Foster is a 58 y.o. female.   Patient is a 58 year old female past medical history of COPD, GI bleed, hyperlipidemia, hypertension.  She presents today with pain in the entire upper back area that is been constant, waxing and waning over the past 2 weeks.  Describes it as soreness and sore to touch.  Worsened with movements of the upper extremities.  Some soreness around the neck muscles.  Denies any injuries.  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  She is also had mild cough with some sputum production.  Yellow/clear mucus.  No associated fevers, chills.  Denies any numbness, tingling or weakness in the upper extremities.  Denies any injuries or falls.  ROS per HPI      Past Medical History:  Diagnosis Date  . COPD (chronic obstructive pulmonary disease) ([place])   . GI bleed   . Hyperlipidemia   . Hypertension   . Tubular adenoma of colon 2013    Patient Active Problem List   Diagnosis Date Noted  . HTN (hypertension) 12/29/2012    Past Surgical History:  Procedure Laterality Date  . CESAREAN SECTION  [id number]   x 2    OB History   No obstetric history on file.      Home Medications    Prior to Admission medications   Medication Sig Start Date End Date Taking? Authorizing Provider  amLODipine (NORVASC) 5 MG tablet Take 5 mg by mouth daily.   Yes [provider]  carisoprodol (SOMA) 350 MG tablet Take 175-375 mg by mouth at bedtime as needed for muscle spasms.   Yes [provider]  omeprazole (PRILOSEC) 40 MG capsule TAKE 1 CAPSULE BY MOUTH ONCE DAILY 09/11/18  Yes Levin Erp, PA  valsartan (DIOVAN) 160 MG tablet Take 160 mg by mouth daily.   Yes [provider]  acetaminophen (TYLENOL) 500 MG  tablet Take 1 tablet (500 mg total) by mouth every 6 (six) hours as needed. 07/01/19   Loura Halt A, NP  albuterol (PROVENTIL HFA;VENTOLIN HFA) 108 (90 Base) MCG/ACT inhaler Inhale 1-2 puffs into the lungs every 6 (six) hours as needed for wheezing or shortness of breath.    [provider]  cyclobenzaprine (FLEXERIL) 10 MG tablet Take 1 tablet (10 mg total) by mouth 2 (two) times daily as needed for muscle spasms. 07/01/19   Loura Halt A, NP  Ferrous Sulfate (IRON) 325 (65 Fe) MG TABS Take 1 tablet (325 mg total) by mouth 2 (two) times daily. 10/15/17   Ladene Artist, MD  fluticasone (FLONASE) 50 MCG/ACT nasal spray Place 2 sprays into both nostrils daily. 11/06/17   Couture, Cortni S, PA-C  potassium chloride (K-DUR) 10 MEQ tablet Take 1 tablet (10 mEq total) by mouth daily. 03/14/17 05/26/19  Volanda Napoleon, PA-C    Family History Family History  Problem Relation Age of Onset  . Heart attack Father   . Diabetes Sister        x 2  . Diabetes Brother     Social History Social History   Tobacco Use  . Smoking status: Former Smoker    Packs/day: 0.20    Types: Cigarettes  . Smokeless tobacco: Never Used  Substance  Use Topics  . Alcohol use: Not Currently    Comment: socially  . Drug use: No     Allergies   Flagyl [metronidazole] and Metronidazole hcl   Review of Systems Review of Systems   Physical Exam Triage Vital Signs ED Triage Vitals  Enc Vitals Group     BP 07/01/19 1046 (!) 173/96     Pulse Rate 07/01/19 1046 66     Resp 07/01/19 1046 18     Temp 07/01/19 1046 97.7 F (36.5 C)     Temp Source 07/01/19 1046 Oral     SpO2 07/01/19 1046 99 %     Weight --      Height --      Head Circumference --      Peak Flow --      Pain Score 07/01/19 1041 6     Pain Loc --      Pain Edu? --      Excl. in [place]? --    No data found.  Updated Vital Signs BP (!) 173/96 (BP Location: Left Arm) Comment: has not taken medicine  Pulse 66   Temp 97.7 F (36.5 C)  (Oral)   Resp 18   SpO2 99%   Visual Acuity Right Eye Distance:   Left Eye Distance:   Bilateral Distance:    Right Eye Near:   Left Eye Near:    Bilateral Near:     Physical Exam Vitals signs and nursing note reviewed.  Constitutional:      General: She is not in acute distress.    Appearance: Normal appearance. She is not ill-appearing, toxic-appearing or diaphoretic.  HENT:     Head: Normocephalic and atraumatic.     Nose: Nose normal.  Eyes:     Conjunctiva/sclera: Conjunctivae normal.  Neck:     Musculoskeletal: Normal range of motion.  Cardiovascular:     Rate and Rhythm: Normal rate and regular rhythm.  Pulmonary:     Effort: Pulmonary effort is normal.     Comments: Diminished lung sounds in all fields. Musculoskeletal: Normal range of motion.     Cervical back: She exhibits tenderness, pain and spasm. She exhibits normal range of motion, no swelling, no edema, no deformity, no laceration and normal pulse.       Back:     Comments: Good range of motion.  Tender to palpation of entire thoracic paravertebral musculature and trapezius muscles. Mild swelling to the right thoracic paravertebral musculature. No bruising, deformities.  No erythema  Skin:    General: Skin is warm and dry.     Findings: No rash.  Neurological:     Mental Status: She is alert.  Psychiatric:        Mood and Affect: Mood normal.      UC Treatments / Results  Labs (all labs ordered are listed, but only abnormal results are displayed) Labs Reviewed  NOVEL CORONAVIRUS, NAA (HOSP ORDER, SEND-OUT TO REF LAB; TAT 18-24 HRS)    EKG   Radiology Dg Chest 2 View  Result Date: 07/01/2019 CLINICAL DATA:  Cough. Posterior thoracic pain between the shoulder blades. EXAM: CHEST - 2 VIEW COMPARISON:  11/06/2017 FINDINGS: The heart size and mediastinal contours are within normal limits. Both lungs are clear. The visualized skeletal structures are unremarkable. IMPRESSION: Normal exam.  Electronically Signed   By: Lorriane Shire M.D.   On: 07/01/2019 11:53    Procedures Procedures (including critical care time)  Medications Ordered in UC Medications -  No data to display  Initial Impression / Assessment and Plan / UC Course  I have reviewed the triage vital signs and the nursing notes.  Pertinent labs & imaging results that were available during my care of the patient were reviewed by me and considered in my medical decision making (see chart for details).     Patient with generalized upper back tenderness with palpation all over the entire back area. Mild swelling to the right paravertebral muscles.  She has had mildly productive cough.  Diminished lung sounds in all fields, former smoker.  Chest x-ray was normal Vital signs stable and she is nontoxic or ill-appearing. Based on symptoms with body aches and cough we will go ahead and test for COVID. Most likely this is muscle soreness due to repetitive movements of the upper arms and back with her job. We will have her take extra drink Tylenol and Flexeril as needed for muscle relaxant Recommended heat Work note given to rest for few days. If her symptoms continue or severely worsen chest pain or shortness of breath she will need to go to the hospital. Final Clinical Impressions(s) / UC Diagnoses   Final diagnoses:  Acute bilateral thoracic back pain  Strain of trapezius muscle, unspecified laterality, initial encounter  Cough     Discharge Instructions     Your chest x-ray was normal. I believe that your pain is most likely muscle soreness and overuse of the muscles from your job. I will give you a low-dose muscle relaxant to take to relax the muscles.  You can apply heat to the area for comfort. Tylenol extra strength every 6 hours as needed. And also testing you for COVID. We will keep you out of work until we get your results. You need to follow-up with your GI doctor  about the rectal bleeding.     ED Prescriptions    Medication Sig Dispense Auth. Provider   cyclobenzaprine (FLEXERIL) 10 MG tablet Take 1 tablet (10 mg total) by mouth 2 (two) times daily as needed for muscle spasms. 20 tablet Galen Malkowski A, NP   acetaminophen (TYLENOL) 500 MG tablet Take 1 tablet (500 mg total) by mouth every 6 (six) hours as needed. 30 tablet Loura Halt A, NP     Controlled Substance Prescriptions [place] Controlled Substance Registry consulted? Not Applicable   Orvan July, NP 07/01/19 1333

## 2019-07-01 NOTE — ED Triage Notes (Signed)
Pain under right shoulder blade for 2 weeks.  No known injury.  Pain in back worsens with movement of right arm.  Soreness across shoulders.

## 2019-07-03 LAB — NOVEL CORONAVIRUS, NAA (HOSP ORDER, SEND-OUT TO REF LAB; TAT 18-24 HRS): SARS-CoV-2, NAA: NOT DETECTED

## 2019-07-10 ENCOUNTER — Ambulatory Visit: Payer: BLUE CROSS/BLUE SHIELD

## 2019-07-17 ENCOUNTER — Other Ambulatory Visit: Payer: Self-pay

## 2019-07-17 ENCOUNTER — Ambulatory Visit (HOSPITAL_COMMUNITY)
Admission: EM | Admit: 2019-07-17 | Discharge: 2019-07-17 | Disposition: A | Discharge status: Home / Self Care | Payer: BLUE CROSS/BLUE SHIELD | Attending: Family Medicine | Admitting: Family Medicine

## 2019-07-17 ENCOUNTER — Encounter (HOSPITAL_COMMUNITY): Payer: Self-pay

## 2019-07-17 DIAGNOSIS — R21 Rash and other nonspecific skin eruption: Secondary | ICD-10-CM | POA: Diagnosis not present

## 2019-07-17 MED ORDER — HYDROXYZINE HCL 25 MG PO TABS: 25.0000 mg | ORAL_TABLET | Freq: Four times a day (QID) | ORAL | 0 refills | Status: DC | PRN

## 2019-07-17 MED ORDER — CLOTRIMAZOLE-BETAMETHASONE 1-0.05 % EX CREA: TOPICAL_CREAM | CUTANEOUS | 0 refills | Status: DC

## 2019-07-17 NOTE — ED Provider Notes (Signed)
[place]    CSN: [id number] Arrival date & time: 07/17/19  1605      History   Chief Complaint Chief Complaint  Patient presents with  . Skin Irritation    HPI Mariah Foster is a 58 y.o. female.   HPI  Patient has a rash on around her neck and a few spots on her chest ever since going to a hair salon months ago.  She states that it comes and goes.  It itches terribly.  Is keeping her awake at night.  She denies any underlying skin disorder or eczema.  She has not been using any new lotion or powder product.  No specific cream or treatment.  Past Medical History:  Diagnosis Date  . COPD (chronic obstructive pulmonary disease) ([place])   . GI bleed   . Hyperlipidemia   . Hypertension   . Tubular adenoma of colon 2013    Patient Active Problem List   Diagnosis Date Noted  . HTN (hypertension) 12/29/2012    Past Surgical History:  Procedure Laterality Date  . CESAREAN SECTION  [id number]   x 2    OB History   No obstetric history on file.      Home Medications    Prior to Admission medications   Medication Sig Start Date End Date Taking? Authorizing Provider  acetaminophen (TYLENOL) 500 MG tablet Take 1 tablet (500 mg total) by mouth every 6 (six) hours as needed. 07/01/19   Loura Halt A, NP  albuterol (PROVENTIL HFA;VENTOLIN HFA) 108 (90 Base) MCG/ACT inhaler Inhale 1-2 puffs into the lungs every 6 (six) hours as needed for wheezing or shortness of breath.    [provider]  amLODipine (NORVASC) 5 MG tablet Take 5 mg by mouth daily.    [provider]  carisoprodol (SOMA) 350 MG tablet Take 175-375 mg by mouth at bedtime as needed for muscle spasms.    [provider]  clotrimazole-betamethasone (LOTRISONE) cream Apply to affected area 2 times daily prn 07/17/19   Raylene Everts, MD  cyclobenzaprine (FLEXERIL) 10 MG tablet Take 1 tablet (10 mg total) by mouth 2 (two) times daily as needed for muscle spasms. 07/01/19   Loura Halt A, NP  Ferrous Sulfate (IRON) 325 (65 Fe) MG TABS Take 1 tablet (325 mg total) by mouth 2 (two) times daily. 10/15/17   Ladene Artist, MD  fluticasone (FLONASE) 50 MCG/ACT nasal spray Place 2 sprays into both nostrils daily. 11/06/17   Couture, Cortni S, PA-C  hydrOXYzine (ATARAX/VISTARIL) 25 MG tablet Take 1 tablet (25 mg total) by mouth every 6 (six) hours as needed for itching. 07/17/19   Raylene Everts, MD  omeprazole (PRILOSEC) 40 MG capsule TAKE 1 CAPSULE BY MOUTH ONCE DAILY 09/11/18   Levin Erp, PA  valsartan (DIOVAN) 160 MG tablet Take 160 mg by mouth daily.    [provider]  potassium chloride (K-DUR) 10 MEQ tablet Take 1 tablet (10 mEq total) by mouth daily. 03/14/17 05/26/19  Volanda Napoleon, PA-C    Family History Family History  Problem Relation Age of Onset  . Heart attack Father   . Diabetes Sister        x 2  . Diabetes Brother     Social History Social History   Tobacco Use  . Smoking status: Former Smoker    Packs/day: 0.20    Types: Cigarettes  . Smokeless tobacco: Never Used  Substance Use Topics  . Alcohol  use: Not Currently    Comment: socially  . Drug use: No     Allergies   Flagyl [metronidazole] and Metronidazole hcl   Review of Systems Review of Systems  Constitutional: Negative for chills and fever.  HENT: Negative for ear pain and sore throat.   Eyes: Negative for pain and visual disturbance.  Respiratory: Negative for cough and shortness of breath.   Cardiovascular: Negative for chest pain and palpitations.  Gastrointestinal: Negative for abdominal pain and vomiting.  Genitourinary: Negative for dysuria and hematuria.  Musculoskeletal: Negative for arthralgias and back pain.  Skin: Positive for rash. Negative for color change.  Neurological: Negative for seizures and syncope.  All other systems reviewed and are negative.    Physical Exam Triage Vital Signs ED Triage Vitals  Enc Vitals Group     BP  07/17/19 1634 (!) 156/96     Pulse Rate 07/17/19 1634 100     Resp 07/17/19 1634 17     Temp 07/17/19 1634 98.8 F (37.1 C)     Temp Source 07/17/19 1634 Oral     SpO2 07/17/19 1634 92 %     Weight --      Height --      Head Circumference --      Peak Flow --      Pain Score 07/17/19 1635 0     Pain Loc --      Pain Edu? --      Excl. in [place]? --    No data found.  Updated Vital Signs BP (!) 156/96 (BP Location: Right Arm)   Pulse 100   Temp 98.8 F (37.1 C) (Oral)   Resp 17   SpO2 92%      Physical Exam Constitutional:      General: She is not in acute distress.    Appearance: She is well-developed and normal weight.  HENT:     Head: Normocephalic and atraumatic.  Eyes:     Conjunctiva/sclera: Conjunctivae normal.     Pupils: Pupils are equal, round, and reactive to light.  Neck:     Musculoskeletal: Normal range of motion.  Cardiovascular:     Rate and Rhythm: Normal rate.  Pulmonary:     Effort: Pulmonary effort is normal. No respiratory distress.  Abdominal:     General: There is no distension.     Palpations: Abdomen is soft.  Musculoskeletal: Normal range of motion.  Skin:    General: Skin is warm and dry.     Comments: Around neck from ear to ear anteriorly there is a skin colored papular rash with fine scale.  No clearing to suggest fungal.  Looks eczematous versus contact dermatitis.  Neurological:     Mental Status: She is alert.      UC Treatments / Results  Labs (all labs ordered are listed, but only abnormal results are displayed) Labs Reviewed - No data to display  EKG   Radiology No results found.  Procedures Procedures (including critical care time)  Medications Ordered in UC Medications - No data to display  Initial Impression / Assessment and Plan / UC Course  I have reviewed the triage vital signs and the nursing notes.  Pertinent labs & imaging results that were available during my care of the patient were reviewed by me  and considered in my medical decision making (see chart for details).      Final Clinical Impressions(s) / UC Diagnoses   Final diagnoses:  Rash and nonspecific  skin eruption     Discharge Instructions     Take the hydroxyzine as needed for itching.  This is useful at bedtime. Use the cream 2 times a day Expect improvement over the next few days.  Call or return for problems   ED Prescriptions    Medication Sig Dispense Auth. Provider   clotrimazole-betamethasone (LOTRISONE) cream Apply to affected area 2 times daily prn 15 g Raylene Everts, MD   hydrOXYzine (ATARAX/VISTARIL) 25 MG tablet Take 1 tablet (25 mg total) by mouth every 6 (six) hours as needed for itching. 20 tablet Raylene Everts, MD     PDMP not reviewed this encounter.   Raylene Everts, MD 07/17/19 [phone]

## 2019-07-17 NOTE — Discharge Instructions (Signed)
Take the hydroxyzine as needed for itching.  This is useful at bedtime. Use the cream 2 times a day Expect improvement over the next few days.  Call or return for problems

## 2019-07-17 NOTE — ED Triage Notes (Signed)
Pt presents with skin irritation on her neck that is itchy and has been ongoing since June; pt believes it to be associated with some hair that that she had in her head may have cause a reaction.

## 2019-08-21 ENCOUNTER — Ambulatory Visit: Payer: BLUE CROSS/BLUE SHIELD

## 2019-08-21 ENCOUNTER — Other Ambulatory Visit: Payer: Self-pay

## 2019-10-30 ENCOUNTER — Other Ambulatory Visit: Payer: Self-pay

## 2019-10-30 ENCOUNTER — Ambulatory Visit (HOSPITAL_COMMUNITY)
Admission: EM | Admit: 2019-10-30 | Discharge: 2019-10-30 | Disposition: A | Discharge status: Home / Self Care | Payer: BC Managed Care – PPO | Attending: Physician Assistant | Admitting: Physician Assistant

## 2019-10-30 DIAGNOSIS — I10 Essential (primary) hypertension: Secondary | ICD-10-CM | POA: Diagnosis not present

## 2019-10-30 DIAGNOSIS — K529 Noninfective gastroenteritis and colitis, unspecified: Secondary | ICD-10-CM | POA: Insufficient documentation

## 2019-10-30 LAB — POCT URINALYSIS DIP (DEVICE)
Bilirubin Urine: NEGATIVE
Glucose, UA: NEGATIVE mg/dL
Ketones, ur: NEGATIVE mg/dL
Leukocytes,Ua: NEGATIVE
Nitrite: NEGATIVE
Protein, ur: 100 mg/dL — AB
Specific Gravity, Urine: >=1.03 (ref 1.005–1.030)
Urobilinogen, UA: 0.2 mg/dL (ref 0.0–1.0)
pH: 7 (ref 5.0–8.0)

## 2019-10-30 NOTE — Discharge Instructions (Addendum)
Follow up with this GI office: [facility]   [phone], [place] [address]   [phone]    Follow up with your primary care provider in 1 week as well.   Stop taking milk of magnesia and pepto bismal.  Drink plenty water and gatorade. Try to drink at least 5 bottles of water and 1 gatorade daily. Eat small light meals without much seasoning. Minimize the amount of dairy products for now.   Take your blood pressure medications as prescribed.   Take tylenol 325mg  tablet x 2 up to every 6 hours for sore throat, fever and body aches. Do not exceed 8 tablets in 24 hours  If you being to have severe belly pain, high fever, bloody diarrhea, severe vomiting, chest pain or shortness of breath please be evaluated at an Emergency Department.

## 2019-10-30 NOTE — ED Triage Notes (Signed)
Pt present abdominal pain with vomiting, diarrhea and nausea. Symptoms started 3 weeks ago. Pt states she has tried OTC medication with no relief.

## 2019-10-30 NOTE — ED Provider Notes (Addendum)
[place]    CSN: [id number] Arrival date & time: 10/30/19  1034      History   Chief Complaint Chief Complaint  Patient presents with  . Abdominal Pain  . Emesis    HPI Mariah Foster is a 59 y.o. female.   Patient reports to urgent care today for 3 weeks of diarrhea and abdominal cramping. She reports beginning 3 weeks ago after eating a sandwich from a vending machine at work she has had liquid like green bowel movements. She has at  Least two episodes a day, that are primarily following food or liquid intake. She reports sudden urgency and cramping with these bowel movements. She describes these cramps as "period cramps" and there is relief of these cramps following bowel movements. She reports the cramps as being "all over my belly" and not able to pin point any location.  She reports one incidence of red in her stool but endorses drinking red liquid punch that day. She denies other episodes. She denies dark and tarry stools. She denies foul odor. She reports utilizing Milk of Magenesia for the first two weeks of her diarrhea, which she believed would help but did not. She stopped this 1 week ago and has since been taking 2-3 doses of pepto bismal daily. She reports "fever in my belly" but denies other fever. She does endorse chills. Denies chest pain, shortness of breath, headache.   She reports 1 episode of nausea with clear vomiting yesterday. No blood in vomit. She denies frequent nausea and has been able to eat and drink. Though she does endorse some reduced appetite.   She does have a history of GI bleed in 2018 which she received EGD and colonoscopy. Chart review shows a 12/2018 consult to GI but was never followed up due to covid. She also reports a history of intermittent constipation and this is why she has used milk of magnesia before.   She reports she did not take her blood pressure medications this morning.      Past Medical History:  Diagnosis Date  .  COPD (chronic obstructive pulmonary disease) ([place])   . GI bleed   . Hyperlipidemia   . Hypertension   . Tubular adenoma of colon 2013    Patient Active Problem List   Diagnosis Date Noted  . HTN (hypertension) 12/29/2012    Past Surgical History:  Procedure Laterality Date  . CESAREAN SECTION  [id number]   x 2    OB History   No obstetric history on file.      Home Medications    Prior to Admission medications   Medication Sig Start Date End Date Taking? Authorizing Provider  acetaminophen (TYLENOL) 500 MG tablet Take 1 tablet (500 mg total) by mouth every 6 (six) hours as needed. 07/01/19   Loura Halt A, NP  albuterol (PROVENTIL HFA;VENTOLIN HFA) 108 (90 Base) MCG/ACT inhaler Inhale 1-2 puffs into the lungs every 6 (six) hours as needed for wheezing or shortness of breath.    [provider]  amLODipine (NORVASC) 5 MG tablet Take 5 mg by mouth daily.    [provider]  carisoprodol (SOMA) 350 MG tablet Take 175-375 mg by mouth at bedtime as needed for muscle spasms.    [provider]  clotrimazole-betamethasone (LOTRISONE) cream Apply to affected area 2 times daily prn 07/17/19   Raylene Everts, MD  cyclobenzaprine (FLEXERIL) 10 MG tablet Take 1 tablet (10 mg total) by mouth 2 (two) times  daily as needed for muscle spasms. 07/01/19   Loura Halt A, NP  Ferrous Sulfate (IRON) 325 (65 Fe) MG TABS Take 1 tablet (325 mg total) by mouth 2 (two) times daily. 10/15/17   Ladene Artist, MD  fluticasone (FLONASE) 50 MCG/ACT nasal spray Place 2 sprays into both nostrils daily. 11/06/17   Couture, Cortni S, PA-C  hydrOXYzine (ATARAX/VISTARIL) 25 MG tablet Take 1 tablet (25 mg total) by mouth every 6 (six) hours as needed for itching. 07/17/19   Raylene Everts, MD  omeprazole (PRILOSEC) 40 MG capsule TAKE 1 CAPSULE BY MOUTH ONCE DAILY 09/11/18   Levin Erp, PA  valsartan (DIOVAN) 160 MG tablet Take 160 mg by mouth daily.    [provider]  potassium chloride (K-DUR) 10 MEQ tablet Take 1 tablet (10 mEq total) by mouth daily. 03/14/17 05/26/19  Volanda Napoleon, PA-C    Family History Family History  Problem Relation Age of Onset  . Heart attack Father   . Diabetes Sister        x 2  . Diabetes Brother     Social History Social History   Tobacco Use  . Smoking status: Former Smoker    Packs/day: 0.20    Types: Cigarettes  . Smokeless tobacco: Never Used  Substance Use Topics  . Alcohol use: Not Currently    Comment: socially  . Drug use: No     Allergies   Flagyl [metronidazole] and Metronidazole hcl   Review of Systems Review of Systems  Constitutional: Positive for appetite change and chills. Negative for diaphoresis and fever.  HENT: Negative for congestion and postnasal drip.   Eyes: Negative for visual disturbance.  Respiratory: Negative for cough and shortness of breath.   Cardiovascular: Negative for chest pain and palpitations.  Gastrointestinal: Positive for abdominal distention (bloating), abdominal pain, blood in stool (not certain), diarrhea, nausea and vomiting. Negative for anal bleeding and constipation.  Endocrine: Positive for polyuria.  Genitourinary: Positive for frequency. Negative for decreased urine volume, difficulty urinating, dysuria, flank pain, hematuria, pelvic pain and urgency.  Musculoskeletal: Negative for arthralgias, back pain and myalgias.  Skin: Negative for color change and rash.  Neurological: Negative for dizziness, syncope, light-headedness and headaches.  All other systems reviewed and are negative.    Physical Exam Triage Vital Signs ED Triage Vitals  Enc Vitals Group     BP 10/30/19 1109 (!) 88/50     Pulse Rate 10/30/19 1109 85     Resp 10/30/19 1109 16     Temp 10/30/19 1109 98.9 F (37.2 C)     Temp Source 10/30/19 1109 Oral     SpO2 10/30/19 1109 100 %     Weight --      Height --      Head Circumference --      Peak Flow --      Pain Score  10/30/19 1110 6     Pain Loc --      Pain Edu? --      Excl. in [place]? --    No data found.  Updated Vital Signs BP (!) 183/107 (BP Location: Right Arm)   Pulse 81   Temp 98.9 F (37.2 C) (Oral)   Resp 16   SpO2 97%   Visual Acuity Right Eye Distance:   Left Eye Distance:   Bilateral Distance:    Right Eye Near:   Left Eye Near:    Bilateral Near:     Physical  Exam Vitals and nursing note reviewed.  Constitutional:      General: She is in acute distress (due to abdominal cramps).     Appearance: She is well-developed. She is not toxic-appearing.  HENT:     Head: Normocephalic and atraumatic.  Eyes:     Conjunctiva/sclera: Conjunctivae normal.  Cardiovascular:     Rate and Rhythm: Normal rate and regular rhythm.     Heart sounds: Normal heart sounds.  Pulmonary:     Effort: Pulmonary effort is normal. No respiratory distress.     Breath sounds: Normal breath sounds. No wheezing or rales.  Abdominal:     General: Abdomen is flat. A surgical scar is present. Bowel sounds are decreased. There is distension. There are no signs of injury.     Palpations: Abdomen is soft. There is no fluid wave, hepatomegaly, splenomegaly or mass.     Tenderness: There is generalized abdominal tenderness (no focal TTP over RLQ, RUQ or LLQ). There is left CVA tenderness and rebound (patient rebounds mild increase in pain with rebound but unclear). There is no right CVA tenderness or guarding. Negative signs include Murphy's sign.     Hernia: No hernia is present.  Musculoskeletal:     Cervical back: Neck supple.  Skin:    General: Skin is warm and dry.  Neurological:     General: No focal deficit present.     Mental Status: She is alert and oriented to person, place, and time.  Psychiatric:        Mood and Affect: Mood normal.        Behavior: Behavior normal.      UC Treatments / Results  Labs (all labs ordered are listed, but only abnormal results are displayed) Labs Reviewed    POCT URINALYSIS DIP (DEVICE) - Abnormal; Notable for the following components:      Result Value   Hgb urine dipstick TRACE (*)    Protein, ur 100 (*)    All other components within normal limits  URINE CULTURE    EKG   Radiology No results found.  Procedures Procedures (including critical care time)  Medications Ordered in UC Medications - No data to display  Initial Impression / Assessment and Plan / UC Course  I have reviewed the triage vital signs and the nursing notes.  Pertinent labs & imaging results that were available during my care of the patient were reviewed by me and considered in my medical decision making (see chart for details).    #Gastroenteritis - UA with trace hGb. Culture sent. Though UTI may explain some symptoms, would not explain all. Believe there was an acute gastroenteritis that was made worse with Milk of Magnesia from a diarrhea and bloating standpoint. Pattern may fit IBS but need follow up to confirm. Recommended stopping all bowel medications and evaluate how she does in a few days due to diarrhea not being profuse. She does seem some what dehydrated with urine spec grav >1.030. Did not feel need for CT at this time due to non-focal nature of discomfort and afebrile. Encouraged to push fluids and gatorade with small meals. Instructed to re-engage GI office from 12/2018, number given. Follow up with PCP in 1 week for re-evaluation. Return and ED precautions given and patient agrees with plan.  - Blood pressure up today but she did not take medications this AM. Instructed to take medications today and follow up with PCP.   Final Clinical Impressions(s) / UC Diagnoses   Final  diagnoses:  Gastroenteritis  Elevated blood pressure reading in office with diagnosis of hypertension     Discharge Instructions     Follow up with this GI office: [facility]   [phone], [place] [address]    [phone]    Follow up with your primary care provider in 1 week as well.   Stop taking milk of magnesia and pepto bismal.  Drink plenty water and gatorade. Try to drink at least 5 bottles of water and 1 gatorade daily. Eat small light meals without much seasoning. Minimize the amount of dairy products for now.   Take your blood pressure medications as prescribed.   Take tylenol 325mg  tablet x 2 up to every 6 hours for sore throat, fever and body aches. Do not exceed 8 tablets in 24 hours  If you being to have severe belly pain, high fever, bloody diarrhea, severe vomiting, chest pain or shortness of breath please be evaluated at an Emergency Department.         ED Prescriptions    None     PDMP not reviewed this encounter.   Purnell Shoemaker, PA-C 10/30/19 1225    Nyshawn Gowdy, Marguerita Beards, PA-C 10/30/19 1227

## 2019-10-30 NOTE — ED Notes (Addendum)
2nd vital signs checked, reported to Provider J. Darr.

## 2019-10-31 ENCOUNTER — Encounter (HOSPITAL_COMMUNITY): Payer: Self-pay | Admitting: Emergency Medicine

## 2019-10-31 ENCOUNTER — Emergency Department (HOSPITAL_COMMUNITY): Payer: BLUE CROSS/BLUE SHIELD

## 2019-10-31 ENCOUNTER — Emergency Department (HOSPITAL_COMMUNITY)
Admission: EM | Admit: 2019-10-31 | Discharge: 2019-10-31 | Disposition: A | Discharge status: Home / Self Care | Payer: BLUE CROSS/BLUE SHIELD | Attending: Emergency Medicine | Admitting: Emergency Medicine

## 2019-10-31 ENCOUNTER — Other Ambulatory Visit: Payer: Self-pay

## 2019-10-31 DIAGNOSIS — E876 Hypokalemia: Secondary | ICD-10-CM | POA: Diagnosis not present

## 2019-10-31 DIAGNOSIS — J449 Chronic obstructive pulmonary disease, unspecified: Secondary | ICD-10-CM | POA: Diagnosis not present

## 2019-10-31 DIAGNOSIS — I10 Essential (primary) hypertension: Secondary | ICD-10-CM | POA: Diagnosis not present

## 2019-10-31 DIAGNOSIS — K529 Noninfective gastroenteritis and colitis, unspecified: Secondary | ICD-10-CM | POA: Diagnosis not present

## 2019-10-31 DIAGNOSIS — R197 Diarrhea, unspecified: Secondary | ICD-10-CM

## 2019-10-31 DIAGNOSIS — R1084 Generalized abdominal pain: Secondary | ICD-10-CM | POA: Diagnosis present

## 2019-10-31 DIAGNOSIS — Z79899 Other long term (current) drug therapy: Secondary | ICD-10-CM | POA: Insufficient documentation

## 2019-10-31 DIAGNOSIS — Z87891 Personal history of nicotine dependence: Secondary | ICD-10-CM | POA: Insufficient documentation

## 2019-10-31 LAB — URINALYSIS, ROUTINE W REFLEX MICROSCOPIC
Bacteria, UA: NONE SEEN
Bilirubin Urine: NEGATIVE
Glucose, UA: NEGATIVE mg/dL
Hgb urine dipstick: NEGATIVE
Ketones, ur: NEGATIVE mg/dL
Leukocytes,Ua: NEGATIVE
Nitrite: NEGATIVE
Protein, ur: 30 mg/dL — AB
Specific Gravity, Urine: 1.028 (ref 1.005–1.030)
pH: 6 (ref 5.0–8.0)

## 2019-10-31 LAB — COMPREHENSIVE METABOLIC PANEL
ALT: 27 U/L (ref 0–44)
AST: 19 U/L (ref 15–41)
Albumin: 4 g/dL (ref 3.5–5.0)
Alkaline Phosphatase: 84 U/L (ref 38–126)
Anion gap: 10 (ref 5–15)
BUN: 18 mg/dL (ref 6–20)
CO2: 31 mmol/L (ref 22–32)
Calcium: 9.1 mg/dL (ref 8.9–10.3)
Chloride: 99 mmol/L (ref 98–111)
Creatinine, Ser: 0.98 mg/dL (ref 0.44–1.00)
GFR calc Af Amer: >60 mL/min (ref >60)
GFR calc non Af Amer: >60 mL/min (ref >60)
Glucose, Bld: 123 mg/dL — ABNORMAL HIGH (ref 70–99)
Potassium: 3 mmol/L — ABNORMAL LOW (ref 3.5–5.1)
Sodium: 140 mmol/L (ref 135–145)
Total Bilirubin: 1.1 mg/dL (ref 0.3–1.2)
Total Protein: 7.2 g/dL (ref 6.5–8.1)

## 2019-10-31 LAB — CBC
HCT: 41.7 % (ref 36.0–46.0)
Hemoglobin: 13.3 g/dL (ref 12.0–15.0)
MCH: 31.2 pg (ref 26.0–34.0)
MCHC: 31.9 g/dL (ref 30.0–36.0)
MCV: 97.9 fL (ref 80.0–100.0)
Platelets: 365 10*3/uL (ref 150–400)
RBC: 4.26 MIL/uL (ref 3.87–5.11)
RDW: 13.9 % (ref 11.5–15.5)
WBC: 6.5 10*3/uL (ref 4.0–10.5)
nRBC: 0 % (ref 0.0–0.2)

## 2019-10-31 LAB — URINE CULTURE: Culture: NO GROWTH

## 2019-10-31 LAB — LIPASE, BLOOD: Lipase: 20 U/L (ref 11–51)

## 2019-10-31 MED ORDER — POTASSIUM CHLORIDE CRYS ER 20 MEQ PO TBCR
40.0000 meq | EXTENDED_RELEASE_TABLET | Freq: Once | ORAL | Status: AC
Start: 2019-10-31 — End: 2019-10-31
  Administered 2019-10-31: 40 meq via ORAL
  Filled 2019-10-31: qty 2

## 2019-10-31 MED ORDER — IOHEXOL 300 MG/ML  SOLN
100.0000 mL | Freq: Once | INTRAMUSCULAR | Status: AC | PRN
  Administered 2019-10-31: 15:00:00 100 mL via INTRAVENOUS

## 2019-10-31 MED ORDER — SODIUM CHLORIDE 0.9% FLUSH: 3.0000 mL | Freq: Once | INTRAVENOUS | Status: DC

## 2019-10-31 NOTE — ED Triage Notes (Signed)
abd pain and diarrhea  X 3 weeks seen at  [facility] yesterday but states they did nt do anything

## 2019-10-31 NOTE — ED Notes (Signed)
Pt ambulated with steady gait to RR.

## 2019-10-31 NOTE — Discharge Instructions (Signed)
Follow-up with GI, call Monday to schedule an appointment.

## 2019-10-31 NOTE — ED Provider Notes (Addendum)
[facility] EMERGENCY DEPARTMENT Provider Note   CSN: [id number] Arrival date & time: 10/31/19  1128     History Chief Complaint  Patient presents with  . Abdominal Pain  . Diarrhea    Mariah Foster is a 59 y.o. female.  59 year old female with possible history of COPD, GI bleed, hyperlipidemia, hypertension as well as tubular adenoma of the colon, presents with complaint of diarrhea x3 weeks ever since eating a sandwich from a vending machine.  Patient states that she has loose stools after eating or drinking anything, occurs multiple times daily, nonbloody.  Patient also reports cramping abdominal pain which is worse with eating or drinking anything but also has pain at baseline.  Also reports nausea, states that she is vomiting, emesis described as spitting up thick clear saliva.  Denies changes in bladder habits.  Patient states that she is having chills, no documented fevers, denies diaphoresis.  No known sick contacts, no recent travel, no recent antibiotic use, no history of C. difficile.  Patient went to urgent care yesterday, was told to avoid dairy and hydrate with water and Gatorade, she feels like she needs more of a work-up and does not want to wait for follow-up.  No other complaints or concerns.        Past Medical History:  Diagnosis Date  . COPD (chronic obstructive pulmonary disease) ([place])   . GI bleed   . Hyperlipidemia   . Hypertension   . Tubular adenoma of colon 2013    Patient Active Problem List   Diagnosis Date Noted  . HTN (hypertension) 12/29/2012    Past Surgical History:  Procedure Laterality Date  . CESAREAN SECTION  [id number]   x 2     OB History   No obstetric history on file.     Family History  Problem Relation Age of Onset  . Heart attack Father   . Diabetes Sister        x 2  . Diabetes Brother     Social History   Tobacco Use  . Smoking status: Former Smoker    Packs/day: 0.20    Types: Cigarettes    . Smokeless tobacco: Never Used  Substance Use Topics  . Alcohol use: Not Currently    Comment: socially  . Drug use: No    Home Medications Prior to Admission medications   Medication Sig Start Date End Date Taking? Authorizing Provider  acetaminophen (TYLENOL) 500 MG tablet Take 1 tablet (500 mg total) by mouth every 6 (six) hours as needed. 07/01/19   Loura Halt A, NP  albuterol (PROVENTIL HFA;VENTOLIN HFA) 108 (90 Base) MCG/ACT inhaler Inhale 1-2 puffs into the lungs every 6 (six) hours as needed for wheezing or shortness of breath.    [provider]  amLODipine (NORVASC) 5 MG tablet Take 5 mg by mouth daily.    [provider]  carisoprodol (SOMA) 350 MG tablet Take 175-375 mg by mouth at bedtime as needed for muscle spasms.    [provider]  clotrimazole-betamethasone (LOTRISONE) cream Apply to affected area 2 times daily prn 07/17/19   Raylene Everts, MD  cyclobenzaprine (FLEXERIL) 10 MG tablet Take 1 tablet (10 mg total) by mouth 2 (two) times daily as needed for muscle spasms. 07/01/19   Loura Halt A, NP  Ferrous Sulfate (IRON) 325 (65 Fe) MG TABS Take 1 tablet (325 mg total) by mouth 2 (two) times daily. 10/15/17   Ladene Artist, MD  fluticasone (  FLONASE) 50 MCG/ACT nasal spray Place 2 sprays into both nostrils daily. 11/06/17   Couture, Cortni S, PA-C  hydrOXYzine (ATARAX/VISTARIL) 25 MG tablet Take 1 tablet (25 mg total) by mouth every 6 (six) hours as needed for itching. 07/17/19   Raylene Everts, MD  omeprazole (PRILOSEC) 40 MG capsule TAKE 1 CAPSULE BY MOUTH ONCE DAILY 09/11/18   Levin Erp, PA  valsartan (DIOVAN) 160 MG tablet Take 160 mg by mouth daily.    [provider]  potassium chloride (K-DUR) 10 MEQ tablet Take 1 tablet (10 mEq total) by mouth daily. 03/14/17 05/26/19  Volanda Napoleon, PA-C    Allergies    Flagyl [metronidazole] and Metronidazole hcl  Review of Systems   Review of Systems  Constitutional:  Positive for chills. Negative for diaphoresis and fever.  Respiratory: Negative for shortness of breath.   Cardiovascular: Negative for chest pain.  Gastrointestinal: Positive for abdominal pain, diarrhea and nausea. Negative for blood in stool, constipation and vomiting.  Genitourinary: Negative for difficulty urinating, dysuria and frequency.  Musculoskeletal: Negative for arthralgias and myalgias.  Skin: Negative for rash and wound.  Allergic/Immunologic: Negative for immunocompromised state.  Neurological: Negative for weakness.  Hematological: Negative for adenopathy.  Psychiatric/Behavioral: Negative for confusion.  All other systems reviewed and are negative.   Physical Exam Updated Vital Signs BP (!) 186/98   Pulse 70   Temp 97.8 F (36.6 C) (Oral)   Resp 19   SpO2 98%   Physical Exam Vitals and nursing note reviewed.  Constitutional:      General: She is not in acute distress.    Appearance: She is well-developed. She is not diaphoretic.  HENT:     Head: Normocephalic and atraumatic.  Cardiovascular:     Rate and Rhythm: Normal rate and regular rhythm.     Heart sounds: Normal heart sounds.  Pulmonary:     Effort: Pulmonary effort is normal.     Breath sounds: Normal breath sounds.  Abdominal:     General: Abdomen is protuberant.     Palpations: Abdomen is soft.     Tenderness: There is generalized abdominal tenderness.     Comments: Patient allows for minimal palpation of her abdomen, smacks examiner's hand away from her secondary to pain.  Skin:    General: Skin is warm and dry.     Findings: No rash.  Neurological:     Mental Status: She is alert and oriented to person, place, and time.  Psychiatric:        Behavior: Behavior normal.     ED Results / Procedures / Treatments   Labs (all labs ordered are listed, but only abnormal results are displayed) Labs Reviewed  COMPREHENSIVE METABOLIC PANEL - Abnormal; Notable for the following components:       Result Value   Potassium 3.0 (*)    Glucose, Bld 123 (*)    All other components within normal limits  URINALYSIS, ROUTINE W REFLEX MICROSCOPIC - Abnormal; Notable for the following components:   Protein, ur 30 (*)    All other components within normal limits  GI PATHOGEN PANEL BY PCR, STOOL  C DIFFICILE QUICK SCREEN W PCR REFLEX  LIPASE, BLOOD  CBC    EKG None  Radiology CT Abdomen Pelvis W Contrast  Result Date: 10/31/2019 CLINICAL DATA:  Abdominal pain. EXAM: CT ABDOMEN AND PELVIS WITH CONTRAST TECHNIQUE: Multidetector CT imaging of the abdomen and pelvis was performed using the standard protocol following bolus administration of  intravenous contrast. CONTRAST:  13mL OMNIPAQUE IOHEXOL 300 MG/ML  SOLN COMPARISON:  September 24, 2015 FINDINGS: Lower chest: The lung bases are clear. The heart size is normal. Hepatobiliary: There is decreased hepatic attenuation suggestive of hepatic steatosis. Normal gallbladder.There is no biliary ductal dilation. Pancreas: Normal contours without ductal dilatation. No peripancreatic fluid collection. Spleen: No splenic laceration or hematoma. Adrenals/Urinary Tract: --Adrenal glands: No adrenal hemorrhage. --Right kidney/ureter: No hydronephrosis or perinephric hematoma. --Left kidney/ureter: No hydronephrosis or perinephric hematoma. --Urinary bladder: Unremarkable. Stomach/Bowel: --Stomach/Duodenum: No hiatal hernia or other gastric abnormality. Normal duodenal course and caliber. --Small bowel: No dilatation or inflammation. --Colon: There is diffuse circumferential wall thickening of the ascending colon and transverse colon. --Appendix: Normal. Vascular/Lymphatic: Atherosclerotic calcification is present within the non-aneurysmal abdominal aorta, without hemodynamically significant stenosis. --No retroperitoneal lymphadenopathy. --No mesenteric lymphadenopathy. --No pelvic or inguinal lymphadenopathy. Reproductive: Unremarkable Other: No ascites or free  air. The abdominal wall is normal. Musculoskeletal. No acute displaced fractures. IMPRESSION: 1. Diffuse circumferential wall thickening of the ascending colon and transverse colon, consistent with colitis. 2. Hepatic steatosis. Aortic Atherosclerosis (ICD10-I70.0). Electronically Signed   By: Constance Holster M.D.   On: 10/31/2019 15:27    Procedures Procedures (including critical care time)  Medications Ordered in ED Medications  sodium chloride flush (NS) 0.9 % injection 3 mL (has no administration in time range)  potassium chloride SA (KLOR-CON) CR tablet 40 mEq (has no administration in time range)  iohexol (OMNIPAQUE) 300 MG/ML solution 100 mL (100 mLs Intravenous Contrast Given 10/31/19 1509)    ED Course  I have reviewed the triage vital signs and the nursing notes.  Pertinent labs & imaging results that were available during my care of the patient were reviewed by me and considered in my medical decision making (see chart for details).  Clinical Course as of Oct 31 1531  Sat Oct 30, 9054  [id number] 59 year old female presents with complaint of 3weeks of abdominal cramping and diarrhea with nausea and questionably vomiting.  No history of similar symptoms previously, no fevers, no recent antibiotics, travel, sick contacts. On exam patient is well-appearing, abdominal exam reveals diffusely tender abdomen, patient does not allow for much palpation of her abdomen as she hits my hands I tried to examine her and states it hurts too much.  Exam show mild hypokalemia which will be orally repleted, urinalysis with small mount of protein, CBC without changes, lipase within normal notes.  Requested GI panel for patient, she will have CT abdomen pelvis due to her degree of abdominal pain.   [LM]  [id number] CT abdomen pelvis returns and shows colitis.  Patient will be referred to GI for follow-up.   [LM]    Clinical Course User Index [LM] Roque Lias   MDM Rules/Calculators/A&P                       Final Clinical Impression(s) / ED Diagnoses Final diagnoses:  Generalized abdominal pain  Hypokalemia  Diarrhea, unspecified type  Colitis    Rx / DC Orders ED Discharge Orders    None       Tacy Learn, PA-C 10/31/19 [place], [place], DO 10/31/19 1525    Tacy Learn, PA-C 10/31/19 1533    Lennice Sites, DO 10/31/19 1546

## 2019-11-02 ENCOUNTER — Telehealth: Payer: Self-pay | Admitting: Gastroenterology

## 2019-11-02 NOTE — Telephone Encounter (Signed)
Left message for patient to call back I scheduled her for tomorrow at 11:00 with Dr. Fuller Plan

## 2019-11-02 NOTE — Telephone Encounter (Signed)
Pt reported that she was in the ED for abdominal pain and was informed that she has GI bleed.  Pt requested to be seen ASAP.

## 2019-11-02 NOTE — Telephone Encounter (Signed)
Patient confirmed the appt with Dr. Fuller Plan at 11:00

## 2019-11-03 ENCOUNTER — Ambulatory Visit: Payer: BLUE CROSS/BLUE SHIELD | Admitting: Gastroenterology

## 2020-02-21 ENCOUNTER — Ambulatory Visit (HOSPITAL_COMMUNITY)
Admission: EM | Admit: 2020-02-21 | Discharge: 2020-02-21 | Disposition: A | Discharge status: Home / Self Care | Payer: BLUE CROSS/BLUE SHIELD | Attending: Family Medicine | Admitting: Family Medicine

## 2020-02-21 ENCOUNTER — Encounter (HOSPITAL_COMMUNITY): Payer: Self-pay

## 2020-02-21 ENCOUNTER — Other Ambulatory Visit: Payer: Self-pay

## 2020-02-21 DIAGNOSIS — A6009 Herpesviral infection of other urogenital tract: Secondary | ICD-10-CM

## 2020-02-21 MED ORDER — VALACYCLOVIR HCL 1 G PO TABS: 1000.0000 mg | ORAL_TABLET | Freq: Two times a day (BID) | ORAL | 2 refills | Status: DC

## 2020-02-21 NOTE — ED Provider Notes (Signed)
[place]    CSN: [id number] Arrival date & time: 02/21/20  1253      History   Chief Complaint Chief Complaint  Patient presents with  . herpes    HPI Mariah Foster is a 59 y.o. female.   HPI  Patient has recurring herpes genitalis She is here for a refill of her valacyclovir She states she has a painful rash that started breaking out yesterday She would like to have this examined to make sure that it is indeed herpes  Past Medical History:  Diagnosis Date  . COPD (chronic obstructive pulmonary disease) ([place])   . GI bleed   . Hyperlipidemia   . Hypertension   . Tubular adenoma of colon 2013    Patient Active Problem List   Diagnosis Date Noted  . HTN (hypertension) 12/29/2012    Past Surgical History:  Procedure Laterality Date  . CESAREAN SECTION  [id number]   x 2    OB History   No obstetric history on file.      Home Medications    Prior to Admission medications   Medication Sig Start Date End Date Taking? Authorizing Provider  acetaminophen (TYLENOL) 500 MG tablet Take 1 tablet (500 mg total) by mouth every 6 (six) hours as needed. 07/01/19   Loura Halt A, NP  albuterol (PROVENTIL HFA;VENTOLIN HFA) 108 (90 Base) MCG/ACT inhaler Inhale 1-2 puffs into the lungs every 6 (six) hours as needed for wheezing or shortness of breath.    [provider]  amLODipine (NORVASC) 5 MG tablet Take 5 mg by mouth daily.    [provider]  carisoprodol (SOMA) 350 MG tablet Take 175-375 mg by mouth at bedtime as needed for muscle spasms.    [provider]  Ferrous Sulfate (IRON) 325 (65 Fe) MG TABS Take 1 tablet (325 mg total) by mouth 2 (two) times daily. 10/15/17   Ladene Artist, MD  fluticasone (FLONASE) 50 MCG/ACT nasal spray Place 2 sprays into both nostrils daily. 11/06/17   Couture, Cortni S, PA-C  omeprazole (PRILOSEC) 40 MG capsule TAKE 1 CAPSULE BY MOUTH ONCE DAILY 09/11/18   Levin Erp, PA  valACYclovir  (VALTREX) 1000 MG tablet Take 1 tablet (1,000 mg total) by mouth 2 (two) times daily. 02/21/20   Raylene Everts, MD  valsartan (DIOVAN) 160 MG tablet Take 160 mg by mouth daily.    [provider]  potassium chloride (K-DUR) 10 MEQ tablet Take 1 tablet (10 mEq total) by mouth daily. 03/14/17 05/26/19  Volanda Napoleon, PA-C    Family History Family History  Problem Relation Age of Onset  . Heart attack Father   . Diabetes Sister        x 2  . Diabetes Brother     Social History Social History   Tobacco Use  . Smoking status: Former Smoker    Packs/day: 0.20    Types: Cigarettes  . Smokeless tobacco: Never Used  Substance Use Topics  . Alcohol use: Not Currently    Comment: socially  . Drug use: No     Allergies   Flagyl [metronidazole] and Metronidazole hcl   Review of Systems Review of Systems   Physical Exam Triage Vital Signs ED Triage Vitals [02/21/20 1307]  Enc Vitals Group     BP      Pulse      Resp      Temp      Temp src  SpO2      Weight 124 lb (56.2 kg)     Height      Head Circumference      Peak Flow      Pain Score 7     Pain Loc      Pain Edu?      Excl. in [place]?    No data found.  Updated Vital Signs Wt 56.2 kg   BMI 21.28 kg/m      Physical Exam Constitutional:      General: She is not in acute distress.    Appearance: She is well-developed.  HENT:     Head: Normocephalic and atraumatic.  Eyes:     Conjunctiva/sclera: Conjunctivae normal.     Pupils: Pupils are equal, round, and reactive to light.  Cardiovascular:     Rate and Rhythm: Normal rate.  Pulmonary:     Effort: Pulmonary effort is normal. No respiratory distress.  Abdominal:     General: There is no distension.     Palpations: Abdomen is soft.  Genitourinary:   Musculoskeletal:        General: Normal range of motion.     Cervical back: Normal range of motion.  Skin:    General: Skin is warm and dry.  Neurological:     Mental Status: She is  alert.  Psychiatric:        Mood and Affect: Mood normal.        Behavior: Behavior normal.      UC Treatments / Results  Labs (all labs ordered are listed, but only abnormal results are displayed) Labs Reviewed - No data to display  EKG   Radiology No results found.  Procedures Procedures (including critical care time)  Medications Ordered in UC Medications - No data to display  Initial Impression / Assessment and Plan / UC Course  I have reviewed the triage vital signs and the nursing notes.  Pertinent labs & imaging results that were available during my care of the patient were reviewed by me and considered in my medical decision making (see chart for details).      Final Clinical Impressions(s) / UC Diagnoses   Final diagnoses:  Herpes genitalis in women     Discharge Instructions     Take 1 pill 3 times a day for 5 days Do this at the first sign of a breakout   ED Prescriptions    Medication Sig Dispense Auth. Provider   valACYclovir (VALTREX) 1000 MG tablet Take 1 tablet (1,000 mg total) by mouth 2 (two) times daily. 10 tablet Raylene Everts, MD     PDMP not reviewed this encounter.   Raylene Everts, MD 02/21/20 [phone]

## 2020-02-21 NOTE — ED Triage Notes (Signed)
Pt states she has herpes she says she's had this before.

## 2020-02-21 NOTE — Discharge Instructions (Addendum)
Take 1 pill 3 times a day for 5 days Do this at the first sign of a breakout

## 2020-05-07 ENCOUNTER — Emergency Department (HOSPITAL_COMMUNITY)
Admission: EM | Admit: 2020-05-07 | Discharge: 2020-05-08 | Disposition: A | Discharge status: Home / Self Care | Payer: BLUE CROSS/BLUE SHIELD | Attending: Emergency Medicine | Admitting: Emergency Medicine

## 2020-05-07 ENCOUNTER — Other Ambulatory Visit: Payer: Self-pay

## 2020-05-07 ENCOUNTER — Encounter (HOSPITAL_COMMUNITY): Payer: Self-pay | Admitting: *Deleted

## 2020-05-07 DIAGNOSIS — K529 Noninfective gastroenteritis and colitis, unspecified: Secondary | ICD-10-CM | POA: Diagnosis not present

## 2020-05-07 DIAGNOSIS — R1031 Right lower quadrant pain: Secondary | ICD-10-CM | POA: Diagnosis present

## 2020-05-07 DIAGNOSIS — J449 Chronic obstructive pulmonary disease, unspecified: Secondary | ICD-10-CM | POA: Diagnosis not present

## 2020-05-07 DIAGNOSIS — E876 Hypokalemia: Secondary | ICD-10-CM

## 2020-05-07 DIAGNOSIS — Z87891 Personal history of nicotine dependence: Secondary | ICD-10-CM | POA: Insufficient documentation

## 2020-05-07 DIAGNOSIS — Z79899 Other long term (current) drug therapy: Secondary | ICD-10-CM | POA: Diagnosis not present

## 2020-05-07 DIAGNOSIS — B029 Zoster without complications: Secondary | ICD-10-CM

## 2020-05-07 DIAGNOSIS — I10 Essential (primary) hypertension: Secondary | ICD-10-CM | POA: Insufficient documentation

## 2020-05-07 NOTE — ED Triage Notes (Signed)
The pt thinks that she was bitten by a bug last Sunday  On her  Left shoulder area back pain and severe pain across her shoulders since then.  How ever she has 4 lesions to her lt shoulder that appear to be a shingles rash

## 2020-05-08 ENCOUNTER — Emergency Department (HOSPITAL_COMMUNITY): Payer: BLUE CROSS/BLUE SHIELD

## 2020-05-08 LAB — COMPREHENSIVE METABOLIC PANEL
ALT: 45 U/L — ABNORMAL HIGH (ref 0–44)
AST: 30 U/L (ref 15–41)
Albumin: 3.6 g/dL (ref 3.5–5.0)
Alkaline Phosphatase: 123 U/L (ref 38–126)
Anion gap: 10 (ref 5–15)
BUN: 13 mg/dL (ref 8–23)
CO2: 31 mmol/L (ref 22–32)
Calcium: 9.2 mg/dL (ref 8.9–10.3)
Chloride: 99 mmol/L (ref 98–111)
Creatinine, Ser: 0.71 mg/dL (ref 0.44–1.00)
GFR calc Af Amer: >60 mL/min (ref >60)
GFR calc non Af Amer: >60 mL/min (ref >60)
Glucose, Bld: 114 mg/dL — ABNORMAL HIGH (ref 70–99)
Potassium: 2.9 mmol/L — ABNORMAL LOW (ref 3.5–5.1)
Sodium: 140 mmol/L (ref 135–145)
Total Bilirubin: 1.4 mg/dL — ABNORMAL HIGH (ref 0.3–1.2)
Total Protein: 7.5 g/dL (ref 6.5–8.1)

## 2020-05-08 LAB — URINALYSIS, ROUTINE W REFLEX MICROSCOPIC
Bacteria, UA: NONE SEEN
Bilirubin Urine: NEGATIVE
Glucose, UA: NEGATIVE mg/dL
Hgb urine dipstick: NEGATIVE
Ketones, ur: 5 mg/dL — AB
Leukocytes,Ua: NEGATIVE
Nitrite: NEGATIVE
Protein, ur: 30 mg/dL — AB
Specific Gravity, Urine: 1.02 (ref 1.005–1.030)
pH: 6 (ref 5.0–8.0)

## 2020-05-08 LAB — LIPASE, BLOOD: Lipase: 22 U/L (ref 11–51)

## 2020-05-08 LAB — CBC WITH DIFFERENTIAL/PLATELET
Abs Immature Granulocytes: 0.03 10*3/uL (ref 0.00–0.07)
Basophils Absolute: 0 10*3/uL (ref 0.0–0.1)
Basophils Relative: 0 %
Eosinophils Absolute: 0.2 10*3/uL (ref 0.0–0.5)
Eosinophils Relative: 2 %
HCT: 35.8 % — ABNORMAL LOW (ref 36.0–46.0)
Hemoglobin: 11 g/dL — ABNORMAL LOW (ref 12.0–15.0)
Immature Granulocytes: 0 %
Lymphocytes Relative: 25 %
Lymphs Abs: 2.5 10*3/uL (ref 0.7–4.0)
MCH: 24.6 pg — ABNORMAL LOW (ref 26.0–34.0)
MCHC: 30.7 g/dL (ref 30.0–36.0)
MCV: 79.9 fL — ABNORMAL LOW (ref 80.0–100.0)
Monocytes Absolute: 0.9 10*3/uL (ref 0.1–1.0)
Monocytes Relative: 9 %
Neutro Abs: 6.4 10*3/uL (ref 1.7–7.7)
Neutrophils Relative %: 64 %
Platelets: 473 10*3/uL — ABNORMAL HIGH (ref 150–400)
RBC: 4.48 MIL/uL (ref 3.87–5.11)
RDW: 16.8 % — ABNORMAL HIGH (ref 11.5–15.5)
WBC: 10 10*3/uL (ref 4.0–10.5)
nRBC: 0 % (ref 0.0–0.2)

## 2020-05-08 MED ORDER — POTASSIUM CHLORIDE CRYS ER 20 MEQ PO TBCR
40.0000 meq | EXTENDED_RELEASE_TABLET | Freq: Once | ORAL | Status: AC
  Administered 2020-05-08: 40 meq via ORAL
  Filled 2020-05-08: qty 2

## 2020-05-08 MED ORDER — VALACYCLOVIR HCL 1 G PO TABS
1000.0000 mg | ORAL_TABLET | Freq: Three times a day (TID) | ORAL | 0 refills | Status: AC
Start: 2020-05-08 — End: 2020-05-15

## 2020-05-08 MED ORDER — IOHEXOL 300 MG/ML  SOLN
100.0000 mL | Freq: Once | INTRAMUSCULAR | Status: AC | PRN
  Administered 2020-05-08: 100 mL via INTRAVENOUS

## 2020-05-08 MED ORDER — MORPHINE SULFATE (PF) 4 MG/ML IV SOLN
4.0000 mg | Freq: Once | INTRAVENOUS | Status: AC
  Administered 2020-05-08: 4 mg via INTRAVENOUS
  Filled 2020-05-08: qty 1

## 2020-05-08 MED ORDER — AMOXICILLIN-POT CLAVULANATE 875-125 MG PO TABS
1.0000 | ORAL_TABLET | Freq: Once | ORAL | Status: AC
  Administered 2020-05-08: 1 via ORAL
  Filled 2020-05-08: qty 1

## 2020-05-08 MED ORDER — POTASSIUM CHLORIDE CRYS ER 10 MEQ PO TBCR: 10.0000 meq | EXTENDED_RELEASE_TABLET | Freq: Every day | ORAL | 0 refills | Status: DC

## 2020-05-08 MED ORDER — AMOXICILLIN-POT CLAVULANATE 875-125 MG PO TABS
1.0000 | ORAL_TABLET | Freq: Two times a day (BID) | ORAL | 0 refills | Status: DC
Start: 2020-05-08 — End: 2023-01-30

## 2020-05-08 NOTE — ED Notes (Signed)
Discharge instructions reviewed with pt. Pt verbalized understanding.   

## 2020-05-08 NOTE — Discharge Instructions (Addendum)
Your CT scan showed findings consistent with inflammation/infection on your right colon. It is recommended that you have a colonoscopy to take a look at this further. I have prescribed you an antibiotic to take for the next week.   Your potassium was mildly low in the ED today. We have provided you supplementation in the ED and I have also prescribed you some medication. Please have your potassium level rechecked in 1 week.  The rash on your back is consistent with shingles. Please take medication as prescribed.   Follow up with your PCP in 1 week for recheck of your symptoms.   Return to the ED for any worsening symptoms

## 2020-05-08 NOTE — ED Provider Notes (Signed)
[facility] Provider Note   CSN: [id number] Arrival date & time: 05/07/20  1805     History Chief Complaint  Patient presents with  . Back Pain  . Abdominal Pain    Mariah Foster is a 59 y.o. female with PMHx HTN, HLD, COPD who presents to the ED today initially complaining of back pain. Per triage report pt thinks she was bitten by a bug last week on her left shoulder. She did not see the bug however noticed pain and a bump to her back and assumed. Nursing staff in triage noticed 4 lesions to left shoulder consistent with shingles rash.   During examination pt complains of diffuse abdominal pain as well as nausea for the past week. She did not mention this during triage. She denies any precipitating event. No suspicious food intake. No recent sick contacts. Denies fevers, chills, vomiting, diarrhea, constipation, urinary sx, pelvic pain, vaginal discharge, or any other associated symptoms.   The history is provided by the patient and medical records.       Past Medical History:  Diagnosis Date  . COPD (chronic obstructive pulmonary disease) ([place])   . GI bleed   . Hyperlipidemia   . Hypertension   . Tubular adenoma of colon 2013    Patient Active Problem List   Diagnosis Date Noted  . HTN (hypertension) 12/29/2012    Past Surgical History:  Procedure Laterality Date  . CESAREAN SECTION  0539,7673   x 2     OB History   No obstetric history on file.     Family History  Problem Relation Age of Onset  . Heart attack Father   . Diabetes Sister        x 2  . Diabetes Brother     Social History   Tobacco Use  . Smoking status: Former Smoker    Packs/day: 0.20    Types: Cigarettes  . Smokeless tobacco: Never Used  Vaping Use  . Vaping Use: Never used  Substance Use Topics  . Alcohol use: Not Currently    Comment: socially  . Drug use: No    Home Medications Prior to Admission medications   Medication Sig Start  Date End Date Taking? Authorizing Provider  acetaminophen (TYLENOL) 500 MG tablet Take 1 tablet (500 mg total) by mouth every 6 (six) hours as needed. 07/01/19   Loura Halt A, NP  albuterol (PROVENTIL HFA;VENTOLIN HFA) 108 (90 Base) MCG/ACT inhaler Inhale 1-2 puffs into the lungs every 6 (six) hours as needed for wheezing or shortness of breath.    [provider]  amLODipine (NORVASC) 5 MG tablet Take 5 mg by mouth daily.    [provider]  amoxicillin-clavulanate (AUGMENTIN) 875-125 MG tablet Take 1 tablet by mouth every 12 (twelve) hours. 05/08/20   Eustaquio Maize, PA-C  carisoprodol (SOMA) 350 MG tablet Take 175-375 mg by mouth at bedtime as needed for muscle spasms.    [provider]  Ferrous Sulfate (IRON) 325 (65 Fe) MG TABS Take 1 tablet (325 mg total) by mouth 2 (two) times daily. 10/15/17   Ladene Artist, MD  fluticasone (FLONASE) 50 MCG/ACT nasal spray Place 2 sprays into both nostrils daily. 11/06/17   Couture, Cortni S, PA-C  omeprazole (PRILOSEC) 40 MG capsule TAKE 1 CAPSULE BY MOUTH ONCE DAILY 09/11/18   Levin Erp, PA  potassium chloride (KLOR-CON M10) 10 MEQ tablet Take 1 tablet (10 mEq total) by mouth daily for 5  doses. 05/08/20 05/13/20  Shera Laubach, PA-C  valACYclovir (VALTREX) 1000 MG tablet Take 1 tablet (1,000 mg total) by mouth 3 (three) times daily for 7 days. 05/08/20 05/15/20  Alroy Bailiff, Perkins Molina, PA-C  valsartan (DIOVAN) 160 MG tablet Take 160 mg by mouth daily.    [provider]  potassium chloride (K-DUR) 10 MEQ tablet Take 1 tablet (10 mEq total) by mouth daily. 03/14/17 05/26/19  Volanda Napoleon, PA-C    Allergies    Flagyl [metronidazole] and Metronidazole hcl  Review of Systems   Review of Systems  Constitutional: Negative for chills and fever.  Gastrointestinal: Positive for abdominal pain and nausea. Negative for constipation, diarrhea and vomiting.  Musculoskeletal: Positive for back pain.  Skin: Positive for  rash.  All other systems reviewed and are negative.   Physical Exam Updated Vital Signs BP (!) 163/81   Pulse 89   Temp 98 F (36.7 C)   Resp 16   Ht 5\' 6"  (1.676 m)   Wt 63.5 kg   SpO2 99%   BMI 22.60 kg/m   Physical Exam Vitals and nursing note reviewed.  Constitutional:      Appearance: She is not ill-appearing.  HENT:     Head: Normocephalic and atraumatic.  Eyes:     Conjunctiva/sclera: Conjunctivae normal.  Cardiovascular:     Rate and Rhythm: Normal rate and regular rhythm.  Pulmonary:     Effort: Pulmonary effort is normal.     Breath sounds: Normal breath sounds.  Abdominal:     General: Bowel sounds are normal.     Palpations: Abdomen is soft.     Tenderness: There is generalized abdominal tenderness. There is guarding (voluntary). There is no right CVA tenderness, left CVA tenderness or rebound.  Musculoskeletal:     Cervical back: Neck supple.       Back:  Skin:    General: Skin is warm and dry.     Findings: Rash present.  Neurological:     Mental Status: She is alert.     ED Results / Procedures / Treatments   Labs (all labs ordered are listed, but only abnormal results are displayed) Labs Reviewed  COMPREHENSIVE METABOLIC PANEL - Abnormal; Notable for the following components:      Result Value   Potassium 2.9 (*)    Glucose, Bld 114 (*)    ALT 45 (*)    Total Bilirubin 1.4 (*)    All other components within normal limits  CBC WITH DIFFERENTIAL/PLATELET - Abnormal; Notable for the following components:   Hemoglobin 11.0 (*)    HCT 35.8 (*)    MCV 79.9 (*)    MCH 24.6 (*)    RDW 16.8 (*)    Platelets 473 (*)    All other components within normal limits  URINALYSIS, ROUTINE W REFLEX MICROSCOPIC - Abnormal; Notable for the following components:   Ketones, ur 5 (*)    Protein, ur 30 (*)    All other components within normal limits  LIPASE, BLOOD    EKG None  Radiology CT Abdomen Pelvis W Contrast  Result Date:  05/08/2020 CLINICAL DATA:  RIGHT-sided abdominal pain with nausea for 6 days. EXAM: CT ABDOMEN AND PELVIS WITH CONTRAST TECHNIQUE: Multidetector CT imaging of the abdomen and pelvis was performed using the standard protocol following bolus administration of intravenous contrast. CONTRAST:  163mL OMNIPAQUE IOHEXOL 300 MG/ML  SOLN COMPARISON:  CT abdomen dated 02/26/2020. FINDINGS: Lower chest: No acute abnormality. Hepatobiliary: No acute or suspicious  findings within the liver. Gallbladder appears normal. No bile duct dilatation seen. Pancreas: Unremarkable. No pancreatic ductal dilatation or surrounding inflammatory changes. Spleen: Normal in size without focal abnormality. Adrenals/Urinary Tract: Adrenal glands appear normal. Kidneys are unremarkable without suspicious mass, stone or hydronephrosis. No ureteral or bladder calculi identified. Bladder appears normal, partially decompressed. Stomach/Bowel: No dilated large or small bowel loops. New thickening of the walls of a focal segment of the RIGHT colon, with mild pericolic inflammation (series 3, image 32; coronal series 6, image 74). Appendix is normal. Stomach is unremarkable, partially decompressed. Vascular/Lymphatic: Aortic atherosclerosis. No enlarged lymph nodes seen. Reproductive: Uterus and bilateral adnexa are unremarkable. Other: No free fluid or abscess collection seen. No free intraperitoneal air. Musculoskeletal: No acute or suspicious osseous finding. Degenerative spondylosis within the lower lumbar spine, mild to moderate in degree. IMPRESSION: 1. Thickening of the walls of a focal segment of the RIGHT colon, with mild pericolic inflammation, most consistent with a focal colitis of infectious, inflammatory or ischemic nature. Neoplastic process is considered less likely, but follow-up with CT or colonoscopy is recommended to ensure benignity. No free fluid or abscess collection seen. No free intraperitoneal air. 2. Appendix is normal. Aortic  Atherosclerosis (ICD10-I70.0). Electronically Signed   By: Franki Cabot M.D.   On: 05/08/2020 05:01    Procedures Procedures (including critical care time)  Medications Ordered in ED Medications  potassium chloride SA (KLOR-CON) CR tablet 40 mEq (40 mEq Oral Given 05/08/20 0526)  iohexol (OMNIPAQUE) 300 MG/ML solution 100 mL (100 mLs Intravenous Contrast Given 05/08/20 0354)  morphine 4 MG/ML injection 4 mg (4 mg Intravenous Given 05/08/20 0529)  amoxicillin-clavulanate (AUGMENTIN) 875-125 MG per tablet 1 tablet (1 tablet Oral Given 05/08/20 0526)    ED Course  I have reviewed the triage vital signs and the nursing notes.  Pertinent labs & imaging results that were available during my care of the patient were reviewed by me and considered in my medical decision making (see chart for details).    MDM Rules/Calculators/A&P                          59 year old female who initially presents today complaining of upper back pain after she assumed she got bitten by a bug 1 week ago.  Noted to have painful "bump" to her back.  When patient was brought back to the room she reports she is also been having diffuse abdominal pain for about a week with associated nausea.  No vomiting, diarrhea.  On arrival to the ED patient is afebrile, nontachycardic nontachypneic.  She is however noted to have diffuse abdominal tenderness with voluntary guarding.  Patient is also noted to have 4 vesicular lesions to her left upper back consistent with shingles.  Will work-up for abdominal pain at this time and plan to discharge home with valacyclovir.    CBC without leukocytosis. Hgb stable at 11.0. Appears pt has a hx of anemia.  CMP noted to have a potassium 2.9.  Will replete in the ED today.  Lipase 22.  UA with ketones, protein.  No infection appreciated. CT scan consistent with colitis. Will treat as such at this time. Radiologist had recommended follow up colonoscopy as neoplasm cannot be excluded. Will provide  augmentin at this time as pt has an allergy to flagyl.   On reevaluation pt reports her pain is improved. Will discharge home at this time. Pt instructed to follow up with PCP. She is in  agreement with plan and stable for discharge home.   This note was prepared using Dragon voice recognition software and may include unintentional dictation errors due to the inherent limitations of voice recognition software.  Final Clinical Impression(s) / ED Diagnoses Final diagnoses:  Herpes zoster without complication  Right lower quadrant abdominal pain  Colitis  Hypokalemia    Rx / DC Orders ED Discharge Orders         Ordered    amoxicillin-clavulanate (AUGMENTIN) 875-125 MG tablet  Every 12 hours     Discontinue  Reprint     05/08/20 0630    valACYclovir (VALTREX) 1000 MG tablet  3 times daily     Discontinue  Reprint     05/08/20 0630    potassium chloride (KLOR-CON M10) 10 MEQ tablet  Daily     Discontinue  Reprint     05/08/20 0630           Discharge Instructions     Your CT scan showed findings consistent with inflammation/infection on your right colon. It is recommended that you have a colonoscopy to take a look at this further. I have prescribed you an antibiotic to take for the next week.   Your potassium was mildly low in the ED today. We have provided you supplementation in the ED and I have also prescribed you some medication. Please have your potassium level rechecked in 1 week.  The rash on your back is consistent with shingles. Please take medication as prescribed.   Follow up with your PCP in 1 week for recheck of your symptoms.   Return to the ED for any worsening symptoms         Eustaquio Maize, PA-C 05/08/20 0630    Veryl Speak, MD 05/08/20 [phone]

## 2020-05-08 NOTE — ED Notes (Addendum)
Pt. Transferred to CT  

## 2021-01-14 ENCOUNTER — Ambulatory Visit (HOSPITAL_COMMUNITY)
Admission: EM | Admit: 2021-01-14 | Discharge: 2021-01-14 | Disposition: A | Discharge status: Home / Self Care | Payer: BLUE CROSS/BLUE SHIELD | Attending: Family Medicine | Admitting: Family Medicine

## 2021-01-14 ENCOUNTER — Ambulatory Visit (INDEPENDENT_AMBULATORY_CARE_PROVIDER_SITE_OTHER): Payer: BLUE CROSS/BLUE SHIELD

## 2021-01-14 ENCOUNTER — Other Ambulatory Visit: Payer: Self-pay

## 2021-01-14 ENCOUNTER — Encounter (HOSPITAL_COMMUNITY): Payer: Self-pay | Admitting: Emergency Medicine

## 2021-01-14 DIAGNOSIS — W19XXXA Unspecified fall, initial encounter: Secondary | ICD-10-CM

## 2021-01-14 DIAGNOSIS — M25551 Pain in right hip: Secondary | ICD-10-CM

## 2021-01-14 DIAGNOSIS — S4991XA Unspecified injury of right shoulder and upper arm, initial encounter: Secondary | ICD-10-CM

## 2021-01-14 DIAGNOSIS — S79911A Unspecified injury of right hip, initial encounter: Secondary | ICD-10-CM | POA: Diagnosis not present

## 2021-01-14 DIAGNOSIS — M25511 Pain in right shoulder: Secondary | ICD-10-CM

## 2021-01-14 MED ORDER — PREDNISONE 20 MG PO TABS
20.0000 mg | ORAL_TABLET | Freq: Every day | ORAL | 0 refills | Status: AC
Start: 2021-01-14 — End: 2021-01-17

## 2021-01-14 MED ORDER — CYCLOBENZAPRINE HCL 5 MG PO TABS
5.0000 mg | ORAL_TABLET | Freq: Two times a day (BID) | ORAL | 0 refills | Status: DC | PRN
Start: 2021-01-14 — End: 2023-04-03

## 2021-01-14 NOTE — ED Triage Notes (Signed)
Patient fell down 10 steps last night.  Today.  Having pain in right shoulder, right hip, neck, right knee.  Patient is limping with ambulation.  Fall was inside the house

## 2021-01-14 NOTE — Discharge Instructions (Signed)
Your imaging is negative for any fractures involving the shoulder or hip. Treating you for inflammation related to the fall prednisone 20 mg once daily for total of 3 days.  For acute pain I am placing her on cyclobenzaprine 5 mg twice daily as needed , avoid driving while taking this medication as it can cause severe drowsiness.  I am putting your right extremity in a shoulder sling for you to wear for comfort may remove as needed.  Recommend applying ice to the upper part of your shoulder to reduce swelling.  Follow-up with primary care provider pain persist following current treatment.

## 2021-01-14 NOTE — ED Provider Notes (Signed)
[place]    CSN: [id number] Arrival date & time: 01/14/21  1703      History   Chief Complaint Chief Complaint  Patient presents with  . Fall    HPI Mariah Foster is a 60 y.o. female.   HPI Patient presents today following a injury sustained after falling down steps in her home x1 day ago.  Patient endorses achiness involving her neck but has full range of motion and has some abrasion involving her right knee.  She denies passing out but subsequently slipped walking down the stairs which resulted in the fall.  Of concern she is having pain with range of motion of her right shoulder and limping due to pain in the right hip.  Patient has a history of chronic hip pain involving the right hip and had imaging done on 01/11/2021 which was significant for mild degenerative joint disease.  She reports not taking anything for pain since injury occurred.  She did not hit her head she is not on a blood thinner.  Patient also has some chronic degenerative lumbar spine disease.  She has limited range of motion with right shoulder with hyperextension patient reports significant pain Past Medical History:  Diagnosis Date  . COPD (chronic obstructive pulmonary disease) ([place])   . GI bleed   . Hyperlipidemia   . Hypertension   . Tubular adenoma of colon 2013    Patient Active Problem List   Diagnosis Date Noted  . HTN (hypertension) 12/29/2012    Past Surgical History:  Procedure Laterality Date  . CESAREAN SECTION  8413,2440   x 2    OB History   No obstetric history on file.      Home Medications    Prior to Admission medications   Medication Sig Start Date End Date Taking? Authorizing Provider  amLODipine (NORVASC) 5 MG tablet Take 5 mg by mouth daily.   Yes [provider]  carisoprodol (SOMA) 350 MG tablet Take 175-375 mg by mouth at bedtime as needed for muscle spasms.   Yes [provider]  Ferrous Sulfate (IRON) 325 (65 Fe) MG TABS Take 1  tablet (325 mg total) by mouth 2 (two) times daily. 10/15/17  Yes Ladene Artist, MD  fluticasone (FLONASE) 50 MCG/ACT nasal spray Place 2 sprays into both nostrils daily. 11/06/17  Yes Couture, Cortni S, PA-C  omeprazole (PRILOSEC) 40 MG capsule TAKE 1 CAPSULE BY MOUTH ONCE DAILY 09/11/18  Yes Levin Erp, PA  valsartan (DIOVAN) 160 MG tablet Take 160 mg by mouth daily.   Yes [provider]  acetaminophen (TYLENOL) 500 MG tablet Take 1 tablet (500 mg total) by mouth every 6 (six) hours as needed. 07/01/19   Loura Halt A, NP  albuterol (PROVENTIL HFA;VENTOLIN HFA) 108 (90 Base) MCG/ACT inhaler Inhale 1-2 puffs into the lungs every 6 (six) hours as needed for wheezing or shortness of breath.    [provider]  amoxicillin-clavulanate (AUGMENTIN) 875-125 MG tablet Take 1 tablet by mouth every 12 (twelve) hours. 05/08/20   Alroy Bailiff, Margaux, PA-C  potassium chloride (KLOR-CON M10) 10 MEQ tablet Take 1 tablet (10 mEq total) by mouth daily for 5 doses. 05/08/20 05/13/20  Alroy Bailiff, Margaux, PA-C  potassium chloride (K-DUR) 10 MEQ tablet Take 1 tablet (10 mEq total) by mouth daily. 03/14/17 05/26/19  Volanda Napoleon, PA-C    Family History Family History  Problem Relation Age of Onset  . Heart attack Father   . Diabetes Sister  x 2  . Diabetes Brother     Social History Social History   Tobacco Use  . Smoking status: Former Smoker    Packs/day: 0.20    Types: Cigarettes  . Smokeless tobacco: Never Used  Vaping Use  . Vaping Use: Never used  Substance Use Topics  . Alcohol use: Not Currently    Comment: socially  . Drug use: No     Allergies   Flagyl [metronidazole] and Metronidazole hcl   Review of Systems Review of Systems Pertinent negatives listed in HPI  .Physical Exam Triage Vital Signs ED Triage Vitals  Enc Vitals Group     BP 01/14/21 1726 (!) 171/101     Pulse Rate 01/14/21 1726 64     Resp 01/14/21 1726 (!) 24     Temp 01/14/21 1726  97.7 F (36.5 C)     Temp Source 01/14/21 1726 Oral     SpO2 01/14/21 1726 100 %     Weight --      Height --      Head Circumference --      Peak Flow --      Pain Score 01/14/21 1723 10     Pain Loc --      Pain Edu? --      Excl. in [place]? --    No data found.  Updated Vital Signs BP (!) 165/83 (BP Location: Right Arm) Comment (BP Location): repositioned arm  Pulse 64   Temp 97.7 F (36.5 C) (Oral)   Resp (!) 24   SpO2 100%   Visual Acuity Right Eye Distance:   Left Eye Distance:   Bilateral Distance:    Right Eye Near:   Left Eye Near:    Bilateral Near:     Physical Exam Constitutional:      Appearance: She is not ill-appearing.  Cardiovascular:     Rate and Rhythm: Normal rate and regular rhythm.     Pulses: Normal pulses.  Pulmonary:     Breath sounds: Normal breath sounds.  Abdominal:     General: Abdomen is flat.  Musculoskeletal:     Right shoulder: Swelling and bony tenderness present. Decreased range of motion. Normal strength.  Neurological:     General: No focal deficit present.     Mental Status: She is alert and oriented to person, place, and time.     Cranial Nerves: No cranial nerve deficit.     Gait: Gait abnormal.  Psychiatric:        Mood and Affect: Mood normal.        Behavior: Behavior normal.        Thought Content: Thought content normal.        Judgment: Judgment normal.     UC Treatments / Results  Labs (all labs ordered are listed, but only abnormal results are displayed) Labs Reviewed - No data to display  EKG   Radiology DG Shoulder Right  Result Date: 01/14/2021 CLINICAL DATA:  Right shoulder injury.  Fall down stairs. EXAM: RIGHT SHOULDER - 2+ VIEW COMPARISON:  None. FINDINGS: No acute bony abnormality. Specifically, no fracture, subluxation, or dislocation. Mild degenerative changes in the right shoulder. Soft tissues are intact. IMPRESSION: No acute bony abnormality. Electronically Signed   By: Rolm Baptise M.D.   On:  01/14/2021 18:07   DG Hip Unilat W or Wo Pelvis 1 View Right  Result Date: 01/14/2021 CLINICAL DATA:  Fall, right hip pain EXAM: DG HIP (WITH OR WITHOUT PELVIS)  1V RIGHT COMPARISON:  None. FINDINGS: Hip joints and SI joints are symmetric and unremarkable. No acute bony abnormality. Specifically, no fracture, subluxation, or dislocation. Degenerative changes in the lower lumbar spine. IMPRESSION: No acute bony abnormality. Electronically Signed   By: Rolm Baptise M.D.   On: 01/14/2021 18:06    Procedures Procedures (including critical care time)  Medications Ordered in UC Medications - No data to display  Initial Impression / Assessment and Plan / UC Course  I have reviewed the triage vital signs and the nursing notes.  Pertinent labs & imaging results that were available during my care of the patient were reviewed by me and considered in my medical decision making (see chart for details).     Patient presents today for evaluation of right hip pain and right shoulder pain following a fall she experienced yesterday.  Imaging negative for any acute fractures however revealed chronic degenerative changes in both the right shoulder and lower lumbar region which was present on previous imaging per care everywhere PCP notes. Patient also has a history of a GI bleed therefore will avoid any NSAIDs.  Treating with just a brief 3-day course of prednisone to reduce inflammation as patient is expressing significant discomfort with activity.  For acute pain cyclobenzaprine 5 mg twice daily as needed patient given strict instructions to avoid any driving while taking medication as it can cause severe drowsiness.  Shoulder immobilizer/sling placed for comfort only and patient may wear as needed.  Patient is ambulatory and stable for discharge.  Final Clinical Impressions(s) / UC Diagnoses   Final diagnoses:  Fall, initial encounter  Injury of right shoulder, initial encounter  Hip injury, right, initial  encounter   Discharge Instructions   None    ED Prescriptions    Medication Sig Dispense Auth. Provider   predniSONE (DELTASONE) 20 MG tablet Take 1 tablet (20 mg total) by mouth daily with breakfast for 3 days. 3 tablet Scot Jun, FNP   cyclobenzaprine (FLEXERIL) 5 MG tablet Take 1 tablet (5 mg total) by mouth 2 (two) times daily as needed for muscle spasms. 20 tablet Scot Jun, FNP     PDMP not reviewed this encounter.   Scot Jun, FNP 01/14/21 [phone]

## 2021-03-10 ENCOUNTER — Other Ambulatory Visit: Payer: Self-pay | Admitting: Family Medicine

## 2021-05-13 ENCOUNTER — Other Ambulatory Visit: Payer: Self-pay

## 2021-05-13 ENCOUNTER — Ambulatory Visit (HOSPITAL_COMMUNITY)
Admission: EM | Admit: 2021-05-13 | Discharge: 2021-05-13 | Disposition: A | Discharge status: Home / Self Care | Payer: BLUE CROSS/BLUE SHIELD

## 2021-05-13 NOTE — ED Notes (Signed)
[facility], patient access reports patient left due to wait time

## 2021-05-16 ENCOUNTER — Ambulatory Visit (HOSPITAL_COMMUNITY)
Admission: EM | Admit: 2021-05-16 | Discharge: 2021-05-16 | Disposition: A | Discharge status: Home / Self Care | Payer: BLUE CROSS/BLUE SHIELD | Attending: Emergency Medicine | Admitting: Emergency Medicine

## 2021-05-16 ENCOUNTER — Other Ambulatory Visit: Payer: Self-pay

## 2021-05-16 ENCOUNTER — Encounter (HOSPITAL_COMMUNITY): Payer: Self-pay

## 2021-05-16 DIAGNOSIS — Z20822 Contact with and (suspected) exposure to covid-19: Secondary | ICD-10-CM | POA: Insufficient documentation

## 2021-05-16 DIAGNOSIS — Z881 Allergy status to other antibiotic agents status: Secondary | ICD-10-CM | POA: Insufficient documentation

## 2021-05-16 DIAGNOSIS — J029 Acute pharyngitis, unspecified: Secondary | ICD-10-CM | POA: Insufficient documentation

## 2021-05-16 DIAGNOSIS — J069 Acute upper respiratory infection, unspecified: Secondary | ICD-10-CM

## 2021-05-16 NOTE — ED Provider Notes (Signed)
[place]    CSN: [id number] Arrival date & time: 05/16/21  1806      History   Chief Complaint Chief Complaint  Patient presents with   Sore Throat    HPI Mariah Foster is a 60 y.o. female.   Patient here for evaluation of cough and congestion that has been ongoing for the past 2 weeks.  Reports a coworker recently tested positive for COVID.  Reports taking Alka-Seltzer plus which has helped with symptoms. Denies any trauma, injury, or other precipitating event.  Denies any specific alleviating or aggravating factors.  Denies any fevers, chest pain, shortness of breath, N/V/D, numbness, tingling, weakness, abdominal pain, or headaches.    The history is provided by the patient.  Sore Throat   Past Medical History:  Diagnosis Date   COPD (chronic obstructive pulmonary disease) ([place])    GI bleed    Hyperlipidemia    Hypertension    Tubular adenoma of colon 2013    Patient Active Problem List   Diagnosis Date Noted   HTN (hypertension) 12/29/2012    Past Surgical History:  Procedure Laterality Date   CESAREAN SECTION  2330,0762   x 2    OB History   No obstetric history on file.      Home Medications    Prior to Admission medications   Medication Sig Start Date End Date Taking? Authorizing Provider  acetaminophen (TYLENOL) 500 MG tablet Take 1 tablet (500 mg total) by mouth every 6 (six) hours as needed. 07/01/19   Loura Halt A, NP  albuterol (PROVENTIL HFA;VENTOLIN HFA) 108 (90 Base) MCG/ACT inhaler Inhale 1-2 puffs into the lungs every 6 (six) hours as needed for wheezing or shortness of breath.    [provider]  amLODipine (NORVASC) 5 MG tablet Take 5 mg by mouth daily.    [provider]  amoxicillin-clavulanate (AUGMENTIN) 875-125 MG tablet Take 1 tablet by mouth every 12 (twelve) hours. 05/08/20   Eustaquio Maize, PA-C  carisoprodol (SOMA) 350 MG tablet Take 175-375 mg by mouth at bedtime as needed for muscle spasms.     [provider]  cyclobenzaprine (FLEXERIL) 5 MG tablet Take 1 tablet (5 mg total) by mouth 2 (two) times daily as needed for muscle spasms. 01/14/21   Scot Jun, FNP  Ferrous Sulfate (IRON) 325 (65 Fe) MG TABS Take 1 tablet (325 mg total) by mouth 2 (two) times daily. 10/15/17   Ladene Artist, MD  fluticasone (FLONASE) 50 MCG/ACT nasal spray Place 2 sprays into both nostrils daily. 11/06/17   Couture, Cortni S, PA-C  omeprazole (PRILOSEC) 40 MG capsule TAKE 1 CAPSULE BY MOUTH ONCE DAILY 09/11/18   Levin Erp, PA  potassium chloride (KLOR-CON M10) 10 MEQ tablet Take 1 tablet (10 mEq total) by mouth daily for 5 doses. 05/08/20 05/13/20  Alroy Bailiff, Margaux, PA-C  valsartan (DIOVAN) 160 MG tablet Take 160 mg by mouth daily.    [provider]  potassium chloride (K-DUR) 10 MEQ tablet Take 1 tablet (10 mEq total) by mouth daily. 03/14/17 05/26/19  Volanda Napoleon, PA-C    Family History Family History  Problem Relation Age of Onset   Heart attack Father    Diabetes Sister        x 2   Diabetes Brother     Social History Social History   Tobacco Use   Smoking status: Former    Packs/day: 0.20    Types: Cigarettes   Smokeless tobacco:  Never  Vaping Use   Vaping Use: Never used  Substance Use Topics   Alcohol use: Yes    Comment: socially   Drug use: No     Allergies   Flagyl [metronidazole] and Metronidazole hcl   Review of Systems Review of Systems  HENT:  Positive for congestion, rhinorrhea and sore throat.   Respiratory:  Positive for cough.   All other systems reviewed and are negative.   Physical Exam Triage Vital Signs ED Triage Vitals  Enc Vitals Group     BP 05/16/21 1856 (!) 165/75     Pulse Rate 05/16/21 1856 80     Resp 05/16/21 1856 18     Temp 05/16/21 1856 97.7 F (36.5 C)     Temp Source 05/16/21 1856 Oral     SpO2 05/16/21 1856 95 %     Weight --      Height --      Head Circumference --      Peak Flow --       Pain Score 05/16/21 1852 8     Pain Loc --      Pain Edu? --      Excl. in [place]? --    No data found.  Updated Vital Signs BP (!) 165/75 (BP Location: Right Arm)   Pulse 80   Temp 97.7 F (36.5 C) (Oral)   Resp 18   SpO2 95%   Visual Acuity Right Eye Distance:   Left Eye Distance:   Bilateral Distance:    Right Eye Near:   Left Eye Near:    Bilateral Near:     Physical Exam Vitals and nursing note reviewed.  Constitutional:      General: She is not in acute distress.    Appearance: Normal appearance. She is not ill-appearing, toxic-appearing or diaphoretic.  HENT:     Head: Normocephalic and atraumatic.     Nose: Congestion and rhinorrhea present.     Mouth/Throat:     Pharynx: Uvula midline.     Tonsils: No tonsillar exudate or tonsillar abscesses.  Eyes:     Conjunctiva/sclera: Conjunctivae normal.  Cardiovascular:     Rate and Rhythm: Normal rate and regular rhythm.     Pulses: Normal pulses.     Heart sounds: Normal heart sounds.  Pulmonary:     Effort: Pulmonary effort is normal.     Breath sounds: Normal breath sounds.  Abdominal:     General: Abdomen is flat.  Musculoskeletal:        General: Normal range of motion.     Cervical back: Normal range of motion.  Skin:    General: Skin is warm and dry.  Neurological:     General: No focal deficit present.     Mental Status: She is alert and oriented to person, place, and time.  Psychiatric:        Mood and Affect: Mood normal.     UC Treatments / Results  Labs (all labs ordered are listed, but only abnormal results are displayed) Labs Reviewed  SARS CORONAVIRUS 2 (TAT 6-24 HRS)    EKG   Radiology No results found.  Procedures Procedures (including critical care time)  Medications Ordered in UC Medications - No data to display  Initial Impression / Assessment and Plan / UC Course  I have reviewed the triage vital signs and the nursing notes.  Pertinent labs & imaging results that were  available during my care of the patient were reviewed  by me and considered in my medical decision making (see chart for details).    Assessment negative for red flags or concerns.  Likely viral URI.  COVID test pending.  Tylenol and/or ibuprofen as needed for pain and fevers.  Discussed conservative symptom management as described in discharge instructions.  Follow-up primary care as needed Final Clinical Impressions(s) / UC Diagnoses   Final diagnoses:  Viral URI with cough     Discharge Instructions      You can take Tylenol and/or Ibuprofen as needed for fever reduction and pain relief.   For cough: honey 1/2 to 1 teaspoon (you can dilute the honey in water or another fluid).  You can also use guaifenesin and dextromethorphan for cough. You can use a humidifier for chest congestion and cough.  If you don't have a humidifier, you can sit in the bathroom with the hot shower running.     For sore throat: try warm salt water gargles, cepacol lozenges, throat spray, warm tea or water with lemon/honey, popsicles or ice, or OTC cold relief medicine for throat discomfort.    For congestion: take a daily anti-histamine like Zyrtec, Claritin, and a oral decongestant, such as pseudoephedrine.  You can also use Flonase 1-2 sprays in each nostril daily.    It is important to stay hydrated: drink plenty of fluids (water, gatorade/powerade/pedialyte, juices, or teas) to keep your throat moisturized and help further relieve irritation/discomfort.   Return or go to the Emergency Department if symptoms worsen or do not improve in the next few days.      ED Prescriptions   None    PDMP not reviewed this encounter.   Pearson Forster, NP 05/16/21 Einar Crow

## 2021-05-16 NOTE — Discharge Instructions (Addendum)
You can take Tylenol and/or Ibuprofen as needed for fever reduction and pain relief.   For cough: honey 1/2 to 1 teaspoon (you can dilute the honey in water or another fluid).  You can also use guaifenesin and dextromethorphan for cough. You can use a humidifier for chest congestion and cough.  If you don't have a humidifier, you can sit in the bathroom with the hot shower running.     For sore throat: try warm salt water gargles, cepacol lozenges, throat spray, warm tea or water with lemon/honey, popsicles or ice, or OTC cold relief medicine for throat discomfort.    For congestion: take a daily anti-histamine like Zyrtec, Claritin, and a oral decongestant, such as pseudoephedrine.  You can also use Flonase 1-2 sprays in each nostril daily.    It is important to stay hydrated: drink plenty of fluids (water, gatorade/powerade/pedialyte, juices, or teas) to keep your throat moisturized and help further relieve irritation/discomfort.   Return or go to the Emergency Department if symptoms worsen or do not improve in the next few days.

## 2021-05-16 NOTE — ED Triage Notes (Signed)
Pt c/o sore throat, runny nose, cough. She would like to be tested for Covid. Pt states pain in right great toe.  Started: two weeks ago Interventions: alkaseltzer plus - helpful

## 2021-05-17 LAB — SARS CORONAVIRUS 2 (TAT 6-24 HRS): SARS Coronavirus 2: NEGATIVE

## 2021-08-29 ENCOUNTER — Other Ambulatory Visit: Payer: Self-pay | Admitting: Family Medicine

## 2021-12-15 ENCOUNTER — Encounter (HOSPITAL_COMMUNITY): Payer: Self-pay

## 2021-12-15 ENCOUNTER — Emergency Department (HOSPITAL_COMMUNITY): Payer: BLUE CROSS/BLUE SHIELD

## 2021-12-15 ENCOUNTER — Other Ambulatory Visit: Payer: Self-pay

## 2021-12-15 ENCOUNTER — Emergency Department (HOSPITAL_COMMUNITY)
Admission: EM | Admit: 2021-12-15 | Discharge: 2021-12-15 | Disposition: A | Discharge status: Home / Self Care | Payer: BLUE CROSS/BLUE SHIELD | Attending: Emergency Medicine | Admitting: Emergency Medicine

## 2021-12-15 DIAGNOSIS — M25511 Pain in right shoulder: Secondary | ICD-10-CM | POA: Insufficient documentation

## 2021-12-15 DIAGNOSIS — R509 Fever, unspecified: Secondary | ICD-10-CM

## 2021-12-15 DIAGNOSIS — Z79899 Other long term (current) drug therapy: Secondary | ICD-10-CM | POA: Diagnosis not present

## 2021-12-15 DIAGNOSIS — J069 Acute upper respiratory infection, unspecified: Secondary | ICD-10-CM | POA: Insufficient documentation

## 2021-12-15 DIAGNOSIS — Z20822 Contact with and (suspected) exposure to covid-19: Secondary | ICD-10-CM | POA: Insufficient documentation

## 2021-12-15 DIAGNOSIS — G4483 Primary cough headache: Secondary | ICD-10-CM

## 2021-12-15 DIAGNOSIS — M546 Pain in thoracic spine: Secondary | ICD-10-CM | POA: Insufficient documentation

## 2021-12-15 DIAGNOSIS — I10 Essential (primary) hypertension: Secondary | ICD-10-CM | POA: Insufficient documentation

## 2021-12-15 DIAGNOSIS — M25512 Pain in left shoulder: Secondary | ICD-10-CM | POA: Insufficient documentation

## 2021-12-15 DIAGNOSIS — H9201 Otalgia, right ear: Secondary | ICD-10-CM | POA: Insufficient documentation

## 2021-12-15 DIAGNOSIS — M791 Myalgia, unspecified site: Secondary | ICD-10-CM

## 2021-12-15 DIAGNOSIS — M542 Cervicalgia: Secondary | ICD-10-CM | POA: Insufficient documentation

## 2021-12-15 DIAGNOSIS — R0602 Shortness of breath: Secondary | ICD-10-CM | POA: Insufficient documentation

## 2021-12-15 DIAGNOSIS — J449 Chronic obstructive pulmonary disease, unspecified: Secondary | ICD-10-CM | POA: Diagnosis not present

## 2021-12-15 DIAGNOSIS — R519 Headache, unspecified: Secondary | ICD-10-CM | POA: Diagnosis present

## 2021-12-15 DIAGNOSIS — Z7951 Long term (current) use of inhaled steroids: Secondary | ICD-10-CM | POA: Diagnosis not present

## 2021-12-15 LAB — CBC WITH DIFFERENTIAL/PLATELET
Abs Immature Granulocytes: 0.02 10*3/uL (ref 0.00–0.07)
Basophils Absolute: 0 10*3/uL (ref 0.0–0.1)
Basophils Relative: 1 %
Eosinophils Absolute: 0.1 10*3/uL (ref 0.0–0.5)
Eosinophils Relative: 2 %
HCT: 46.4 % — ABNORMAL HIGH (ref 36.0–46.0)
Hemoglobin: 15.5 g/dL — ABNORMAL HIGH (ref 12.0–15.0)
Immature Granulocytes: 0 %
Lymphocytes Relative: 25 %
Lymphs Abs: 2.1 10*3/uL (ref 0.7–4.0)
MCH: 30.2 pg (ref 26.0–34.0)
MCHC: 33.4 g/dL (ref 30.0–36.0)
MCV: 90.4 fL (ref 80.0–100.0)
Monocytes Absolute: 1.5 10*3/uL — ABNORMAL HIGH (ref 0.1–1.0)
Monocytes Relative: 18 %
Neutro Abs: 4.5 10*3/uL (ref 1.7–7.7)
Neutrophils Relative %: 54 %
Platelets: 334 10*3/uL (ref 150–400)
RBC: 5.13 MIL/uL — ABNORMAL HIGH (ref 3.87–5.11)
RDW: 12.1 % (ref 11.5–15.5)
WBC: 8.2 10*3/uL (ref 4.0–10.5)
nRBC: 0 % (ref 0.0–0.2)

## 2021-12-15 LAB — BASIC METABOLIC PANEL
Anion gap: 13 (ref 5–15)
BUN: 15 mg/dL (ref 8–23)
CO2: 25 mmol/L (ref 22–32)
Calcium: 9.2 mg/dL (ref 8.9–10.3)
Chloride: 100 mmol/L (ref 98–111)
Creatinine, Ser: 0.87 mg/dL (ref 0.44–1.00)
GFR, Estimated: >60 mL/min (ref >60)
Glucose, Bld: 127 mg/dL — ABNORMAL HIGH (ref 70–99)
Potassium: 3.3 mmol/L — ABNORMAL LOW (ref 3.5–5.1)
Sodium: 138 mmol/L (ref 135–145)

## 2021-12-15 LAB — RESP PANEL BY RT-PCR (FLU A&B, COVID) ARPGX2
Influenza A by PCR: NEGATIVE
Influenza B by PCR: NEGATIVE
SARS Coronavirus 2 by RT PCR: NEGATIVE

## 2021-12-15 MED ORDER — IPRATROPIUM-ALBUTEROL 0.5-2.5 (3) MG/3ML IN SOLN
3.0000 mL | Freq: Once | RESPIRATORY_TRACT | Status: AC
  Administered 2021-12-15: 3 mL via RESPIRATORY_TRACT
  Filled 2021-12-15: qty 3

## 2021-12-15 MED ORDER — ALBUTEROL SULFATE HFA 108 (90 BASE) MCG/ACT IN AERS
2.0000 | INHALATION_SPRAY | Freq: Once | RESPIRATORY_TRACT | Status: AC
  Administered 2021-12-15: 2 via RESPIRATORY_TRACT
  Filled 2021-12-15: qty 6.7

## 2021-12-15 MED ORDER — ACETAMINOPHEN 500 MG PO TABS
1000.0000 mg | ORAL_TABLET | Freq: Once | ORAL | Status: AC
  Administered 2021-12-15: 1000 mg via ORAL
  Filled 2021-12-15: qty 2

## 2021-12-15 MED ORDER — KETOROLAC TROMETHAMINE 15 MG/ML IJ SOLN
15.0000 mg | Freq: Once | INTRAMUSCULAR | Status: AC
  Administered 2021-12-15: 15 mg via INTRAVENOUS
  Filled 2021-12-15: qty 1

## 2021-12-15 MED ORDER — IOHEXOL 350 MG/ML SOLN
115.0000 mL | Freq: Once | INTRAVENOUS | Status: AC | PRN
  Administered 2021-12-15: 115 mL via INTRAVENOUS

## 2021-12-15 NOTE — ED Provider Triage Note (Signed)
Emergency Medicine Provider Triage Evaluation Note  Mariah Foster , a 61 y.o. female  was evaluated in triage.  Pt complains of head and neck pain. Patient reports headache and right sided otalgia yesterday. Saw her PCP in a telemedicine visit and was prescribed an abx for presumed otitis media; took 1 dose. Reports acute worsening of pain tonight with radiation into her neck and shoulders. Pain aggravated by movement. Patient notes associated SOB, mild dysphagia. Continues to tolerate secretions. No fever, syncope, vomiting.  Review of Systems  Positive: As above Negative: As above  Physical Exam  BP (!) 156/136 (BP Location: Right Arm)    Pulse (!) 122    Temp 98.3 F (36.8 C) (Oral)    Resp (!) 26    Ht 5\' 4"  (1.626 m)    Wt 65.8 kg    SpO2 100%    BMI 24.89 kg/m  Gen:   Awake, appears uncomfortable Resp:  Mild tachypnea without distress or accessory muscle use MSK:   Moves extremities without difficulty  Other:  Decreased, but equal, breath sounds throughout. Spastic, dry cough noted. TTP along the posterior b/l cervical paraspinal muscles without crepitus. No midline deformity or step offs. Bilateral TMs normal.  Medical Decision Making  Medically screening exam initiated at 3:14 AM.  Appropriate orders placed.  Mariah Foster was informed that the remainder of the evaluation will be completed by another provider, this initial triage assessment does not replace that evaluation, and the importance of remaining in the ED until their evaluation is complete.  Unclear headache and neck pain. Nonfocal assessment in triage. No crepitus. Will obtain labs and imaging to further assess pain. Physical exam not c/w acute otitis media.   Antonietta Breach, PA-C 12/15/21 [phone]

## 2021-12-15 NOTE — ED Provider Notes (Signed)
[facility] EMERGENCY DEPARTMENT Provider Note  CSN: [id number] Arrival date & time: 12/15/21 7412  Chief Complaint(s) Neck Pain and Shortness of Breath  HPI Mariah Foster is a 61 y.o. female with a past medical history listed below including COPD who presents to the emergency department with headache, neck pain, bilateral shoulder pain, right ear pain, cough, myalgias, low-grade fevers.  Patient has been having URI symptoms for the past several days.  She had a visit with her primary care provider yesterday who diagnosed her with a viral URI and right otitis media.  She was prescribed amoxicillin for possible bacterial infection.  Patient reports that she has been having a severe cough making her headache, neck pain and upper back pain worse.  She is endorsing mild shortness of breath.  Reports that she ran out of her albuterol inhaler.  Denies any chest pain.  No nausea or vomiting.  No abdominal pain.  No urinary symptoms.  No diarrhea.  The history is provided by the patient.   Past Medical History Past Medical History:  Diagnosis Date   COPD (chronic obstructive pulmonary disease) ([place])    GI bleed    Hyperlipidemia    Hypertension    Tubular adenoma of colon 2013   Patient Active Problem List   Diagnosis Date Noted   HTN (hypertension) 12/29/2012   Home Medication(s) Prior to Admission medications   Medication Sig Start Date End Date Taking? Authorizing Provider  albuterol (PROVENTIL HFA;VENTOLIN HFA) 108 (90 Base) MCG/ACT inhaler Inhale 1-2 puffs into the lungs every 6 (six) hours as needed for wheezing or shortness of breath.   Yes [provider]  albuterol (PROVENTIL) (2.5 MG/3ML) 0.083% nebulizer solution Take 2.5 mg by nebulization 3 (three) times daily as needed for wheezing or shortness of breath. 09/27/21  Yes [provider]  amLODipine (NORVASC) 5 MG tablet Take 5 mg by mouth daily.   Yes [provider]   amoxicillin-clavulanate (AUGMENTIN) 875-125 MG tablet Take 1 tablet by mouth every 12 (twelve) hours. Patient taking differently: Take 1 tablet by mouth See admin instructions. Bid x 7 days 05/08/20  Yes Venter, Margaux, PA-C  atorvastatin (LIPITOR) 10 MG tablet Take 10 mg by mouth daily. 11/29/21  Yes [provider]  baclofen (LIORESAL) 10 MG tablet Take 10 mg by mouth 2 (two) times daily as needed for muscle spasms. 09/15/21  Yes [provider]  DULoxetine (CYMBALTA) 30 MG capsule Take 30 mg by mouth 2 (two) times daily. 08/29/21  Yes [provider]  Ferrous Sulfate (IRON) 325 (65 Fe) MG TABS Take 1 tablet (325 mg total) by mouth 2 (two) times daily. Patient taking differently: Take 325 mg by mouth daily. 10/15/17  Yes Ladene Artist, MD  irbesartan (AVAPRO) 150 MG tablet Take 150 mg by mouth daily. 08/30/21  Yes [provider]  levothyroxine (SYNTHROID) 75 MCG tablet Take 75 mcg by mouth daily. 08/29/21  Yes [provider]  traMADol (ULTRAM) 50 MG tablet Take 50 mg by mouth every 6 (six) hours as needed. 11/29/21  Yes [provider]  traZODone (DESYREL) 50 MG tablet Take 50-100 mg by mouth at bedtime as needed for sleep. 09/27/21  Yes [provider]  acetaminophen (TYLENOL) 500 MG tablet Take 1 tablet (500 mg total) by mouth every 6 (six) hours as needed. Patient not taking: Reported on 12/15/2021 07/01/19   Loura Halt A, NP  cyclobenzaprine (FLEXERIL) 5 MG tablet Take 1 tablet (5 mg total)  by mouth 2 (two) times daily as needed for muscle spasms. Patient not taking: Reported on 12/15/2021 01/14/21   Scot Jun, FNP  fluticasone [facility]) 50 MCG/ACT nasal spray Place 2 sprays into both nostrils daily. Patient not taking: Reported on 12/15/2021 11/06/17   Couture, Cortni S, PA-C  omeprazole (PRILOSEC) 40 MG capsule TAKE 1 CAPSULE BY MOUTH ONCE DAILY Patient not taking: Reported on 12/15/2021 09/11/18   Levin Erp, PA   potassium chloride (KLOR-CON M10) 10 MEQ tablet Take 1 tablet (10 mEq total) by mouth daily for 5 doses. Patient not taking: Reported on 12/15/2021 05/08/20 05/13/20  Eustaquio Maize, PA-C  potassium chloride (K-DUR) 10 MEQ tablet Take 1 tablet (10 mEq total) by mouth daily. 03/14/17 05/26/19  Volanda Napoleon, PA-C                                                                                                                                    Allergies Flagyl [metronidazole] and Metronidazole hcl  Review of Systems Review of Systems As noted in HPI  Physical Exam Vital Signs  I have reviewed the triage vital signs BP (!) 135/94    Pulse 96    Temp (!) 100.4 F (38 C) (Oral)    Resp 16    Ht 5\' 4"  (1.626 m)    Wt 65.8 kg    SpO2 95%    BMI 24.89 kg/m   Physical Exam Vitals reviewed.  Constitutional:      General: She is not in acute distress.    Appearance: She is well-developed. She is not diaphoretic.  HENT:     Head: Normocephalic and atraumatic.     Right Ear: A middle ear effusion is present. Tympanic membrane is not injected, perforated or erythematous.     Left Ear:  No middle ear effusion. Tympanic membrane is not injected or erythematous.     Nose: Nose normal.     Mouth/Throat:     Pharynx: No pharyngeal swelling, oropharyngeal exudate, posterior oropharyngeal erythema or uvula swelling.     Tonsils: No tonsillar exudate.  Eyes:     General: No scleral icterus.       Right eye: No discharge.        Left eye: No discharge.     Conjunctiva/sclera: Conjunctivae normal.     Pupils: Pupils are equal, round, and reactive to light.  Cardiovascular:     Rate and Rhythm: Normal rate and regular rhythm.     Heart sounds: No murmur heard.   No friction rub. No gallop.  Pulmonary:     Effort: Pulmonary effort is normal. No respiratory distress.     Breath sounds: Normal breath sounds. Decreased air movement present. No stridor. No wheezing, rhonchi or rales.  Abdominal:      General: There is no distension.     Palpations: Abdomen is soft.     Tenderness: There is no  abdominal tenderness.  Musculoskeletal:     Cervical back: Normal range of motion and neck supple. Tenderness present. No rigidity or torticollis. Muscular tenderness present. No spinous process tenderness. Normal range of motion.       Back:  Skin:    General: Skin is warm and dry.     Findings: No erythema or rash.  Neurological:     Mental Status: She is alert and oriented to person, place, and time.    ED Results and Treatments Labs (all labs ordered are listed, but only abnormal results are displayed) Labs Reviewed  CBC WITH DIFFERENTIAL/PLATELET - Abnormal; Notable for the following components:      Result Value   RBC 5.13 (*)    Hemoglobin 15.5 (*)    HCT 46.4 (*)    Monocytes Absolute 1.5 (*)    All other components within normal limits  BASIC METABOLIC PANEL - Abnormal; Notable for the following components:   Potassium 3.3 (*)    Glucose, Bld 127 (*)    All other components within normal limits  RESP PANEL BY RT-PCR (FLU A&B, COVID) [id number]                                                                                                                         EKG  EKG Interpretation  Date/Time:  Friday December 15 2021 03:01:01 EST Ventricular Rate:  123 PR Interval:  76 QRS Duration: 60 QT Interval:  400 QTC Calculation: 572 R Axis:   95 Text Interpretation: Sinus tachycardia with short PR Rightward axis Septal infarct , age undetermined Prolonged QT Abnormal ECG When compared with ECG of 08-May-2020 05:04, QT has lengthened Confirmed by Delora Fuel ([id number]) on 12/15/2021 3:07:30 AM       Radiology DG Chest 1 View  Result Date: 12/15/2021 CLINICAL DATA:  Shortness of breath EXAM: CHEST  1 VIEW COMPARISON:  04/28/2021 FINDINGS: The heart size and mediastinal contours are within normal limits. Both lungs are clear. The visualized skeletal structures are unremarkable.  IMPRESSION: No active disease. Electronically Signed   By: Ulyses Jarred M.D.   On: 12/15/2021 03:46   CT Head Wo Contrast  Result Date: 12/15/2021 CLINICAL DATA:  61 year old female with cough, shortness of breath, headache, neck and right ear pain. Recently diagnosed with right otitis media and started on antibiotics. EXAM: CT HEAD WITHOUT CONTRAST TECHNIQUE: Contiguous axial images were obtained from the base of the skull through the vertex without intravenous contrast. RADIATION DOSE REDUCTION: This exam was performed according to the departmental dose-optimization program which includes automated exposure control, adjustment of the mA and/or kV according to patient size and/or use of iterative reconstruction technique. COMPARISON:  Head CT 09/12/2006. FINDINGS: Brain: Cerebral volume is within normal limits for age. No midline shift, ventriculomegaly, mass effect, evidence of mass lesion, intracranial hemorrhage or evidence of cortically based acute infarction. Gray-white matter differentiation is within normal limits throughout the brain. Mild chronic vascular calcifications in the basal ganglia. Vascular:  Calcified atherosclerosis at the skull base. No suspicious intracranial vascular hyperdensity. Skull: No acute osseous abnormality identified. Sinuses/Orbits: Bilateral tympanic cavities are clear. Bilateral mastoids are clear. Bilateral external auditory canals appear normal. Mild ethmoid sinus mucosal thickening. Other visible paranasal sinuses are well aerated. No sinus fluid level identified. Other: Visualized orbits and scalp soft tissues are within normal limits. IMPRESSION: 1. Normal for age non contrast CT appearance of the brain. 2. No evidence of otitis media, bilateral middle ears and mastoids are clear. 3. There is mild bilateral ethmoid sinus inflammation. Electronically Signed   By: Genevie Ann M.D.   On: 12/15/2021 05:07   CT Soft Tissue Neck W Contrast  Result Date: 12/15/2021 CLINICAL  DATA:  61 year old female with cough, shortness of breath, headache, neck and right ear pain. Recently diagnosed with right otitis media and started on antibiotics. EXAM: CT NECK WITH CONTRAST TECHNIQUE: Multidetector CT imaging of the neck was performed using the standard protocol following the bolus administration of intravenous contrast. RADIATION DOSE REDUCTION: This exam was performed according to the departmental dose-optimization program which includes automated exposure control, adjustment of the mA and/or kV according to patient size and/or use of iterative reconstruction technique. CONTRAST:  151mL OMNIPAQUE IOHEXOL 350 MG/ML SOLN COMPARISON:  CT head, cervical spine and CTA chest today reported separately. FINDINGS: Pharynx and larynx: Motion artifact at the hypopharynx. But otherwise the pharyngeal and laryngeal soft tissue contours are within normal limits. The pharynx is distended with gas. No tonsillar hyperenhancement or enlargement. Parapharyngeal and retropharyngeal spaces appear negative. Salivary glands: Negative. Thyroid: Negative. Lymph nodes: Bilateral cervical lymph nodes are within normal limits. No enlarged or heterogeneous nodes. Vascular: Major vascular structures in the neck and at the skull base are patent. Motion artifact at the bilateral carotid bifurcations. Calcified atherosclerosis at the skull base. Limited intracranial: Negative.  Sigmoid sinuses are patent. Visualized orbits: Postoperative changes to the left globe, otherwise negative. Mastoids and visualized paranasal sinuses: Bilateral tympanic cavities and mastoids are clear. External auditory canals appear normal. Periauricular soft tissues appears symmetric and within normal limits. There is mild to moderate bilateral maxillary and ethmoid sinus mucosal thickening. Trace bubbly opacity but no sinus fluid level. Skeleton: Cervical spine detailed separately. Absent posterior dentition. No acute osseous abnormality identified.  Upper chest: Emphysema. See chest CTA reported separately. IMPRESSION: 1. Mild motion artifact, but no acute or inflammatory process identified in the Neck. 2. No evidence of otitis media (bilateral middle ears and mastoids are clear), but there is mild to moderate bilateral maxillary and ethmoid sinus inflammation. No complicating features. 3. Emphysema (ICD10-J43.9), Chest CTA reported separately. Electronically Signed   By: Genevie Ann M.D.   On: 12/15/2021 05:16   CT Angio Chest PE W and/or Wo Contrast  Result Date: 12/15/2021 CLINICAL DATA:  61 year old female with cough, shortness of breath, headache, neck and right ear pain. Recently diagnosed with right otitis media and started on antibiotics. EXAM: CT ANGIOGRAPHY CHEST WITH CONTRAST TECHNIQUE: Multidetector CT imaging of the chest was performed using the standard protocol during bolus administration of intravenous contrast. Multiplanar CT image reconstructions and MIPs were obtained to evaluate the vascular anatomy. RADIATION DOSE REDUCTION: This exam was performed according to the departmental dose-optimization program which includes automated exposure control, adjustment of the mA and/or kV according to patient size and/or use of iterative reconstruction technique. CONTRAST:  166mL OMNIPAQUE IOHEXOL 350 MG/ML SOLN COMPARISON:  Neck CT today reported separately. Chest CTA 02/24/2019. CT Abdomen and Pelvis 09/24/2015. FINDINGS: Cardiovascular: Good  contrast bolus timing in the pulmonary arterial tree. Mild enlargement of the central pulmonary arteries (series 7, image 188). Normal bilateral pulmonary artery enhancement. No focal filling defect identified in the pulmonary arteries to suggest acute pulmonary embolism. Calcified coronary artery atherosclerosis (series 7, image 228). No cardiomegaly or pericardial effusion. Negative visible aorta aside from atherosclerosis. Mediastinum/Nodes: Negative. No mediastinal mass or lymphadenopathy. Lungs/Pleura:  Moderate to severe upper lobe centrilobular emphysema. Less pronounced middle and lower lobe emphysema. Large lung volumes. There is mild dependent retained secretions throughout the trachea, but the major airways remain patent. There is widespread bronchial wall thickening. No pleural effusion or evidence of acute pulmonary inflammation. No pulmonary nodule. Upper Abdomen: Visible liver is stable since 2016, with small benign cyst or hemangioma of the central liver dome. Negative visible spleen, pancreas, adrenal glands, kidneys and bowel. Musculoskeletal: Negative. Review of the MIP images confirms the above findings. IMPRESSION: 1. Negative for acute pulmonary embolus. Enlargement of the central pulmonary artery raising the possibility of pulmonary artery hypertension. 2. Emphysema (ICD10-J43.9), severe in the upper lobes. Generalized bronchial wall thickening. Some retained secretions in the trachea. 3. Calcified coronary artery and Aortic Atherosclerosis (ICD10-I70.0). Electronically Signed   By: Genevie Ann M.D.   On: 12/15/2021 05:23   CT C-SPINE NO CHARGE  Result Date: 12/15/2021 CLINICAL DATA:  61 year old female with cough, shortness of breath, headache, neck and right ear pain. Recently diagnosed with right otitis media and started on antibiotics. EXAM: CT CERVICAL SPINE WITH CONTRAST TECHNIQUE: Multiplanar CT images of the cervical spine were reconstructed from contemporary CT of the Neck. RADIATION DOSE REDUCTION: This exam was performed according to the departmental dose-optimization program which includes automated exposure control, adjustment of the mA and/or kV according to patient size and/or use of iterative reconstruction technique. CONTRAST:  No additional COMPARISON:  CT head, neck, and chest the same day reported separately. FINDINGS: Alignment: Reversal of the normal cervical lordosis. Cervicothoracic junction alignment is within normal limits. Bilateral posterior element alignment is within  normal limits. Skull base and vertebrae: Visualized skull base is intact. No atlanto-occipital dissociation. No acute osseous abnormality identified. Soft tissues and spinal canal: IV contrast on board. Neck soft tissues are reported separately. Unremarkable cervical spinal canal aside from evidence of stenosis. Disc levels: Widespread cervical disc and endplate degeneration. 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 Moderate right side facet hypertrophy also at C4-C5. Widespread mild cervical spinal stenosis suspected. Upper chest: Visible upper thoracic levels appear intact. Chest CTA today reported separately. IMPRESSION: 1. No acute osseous abnormality in the cervical spine. 2. Widespread cervical spinal stenosis suspected due to combined congenital canal narrowing, disc and endplate degeneration. Electronically Signed   By: Genevie Ann M.D.   On: 12/15/2021 05:11    Pertinent labs & imaging results that were available during my care of the patient were reviewed by me and considered in my medical decision making (see MDM for details).  Medications Ordered in ED Medications  iohexol (OMNIPAQUE) 350 MG/ML injection 115 mL (115 mLs Intravenous Contrast Given 12/15/21 0450)  acetaminophen (TYLENOL) tablet 1,000 mg (1,000 mg Oral Given 12/15/21 0504)  ketorolac (TORADOL) 15 MG/ML injection 15 mg (15 mg Intravenous Given 12/15/21 0505)  ipratropium-albuterol (DUONEB) 0.5-2.5 (3) MG/3ML nebulizer solution 3 mL (3 mLs Nebulization Given 12/15/21 0505)  albuterol (VENTOLIN HFA) 108 (90 Base) MCG/ACT inhaler 2 puff (2 puffs Inhalation Given 12/15/21 0706)  Procedures Procedures  (including critical care time)  Medical Decision Making / ED Course        Patient presents with constellations of likely viral infection Noted to have a low-grade fever  of 100.4 here.  She is well-hydrated and nontoxic.  She is tachypneic and tachycardic, satting 91% on room air. Exam is not consistent with meningitis.  Exam is nonfocal. We will obtain labs and imaging to rule out other serious bacterial infection including pneumonia.  As above I have low suspicion for meningitis.  We will also assess for possible PE.  Patient was seen in the MSE process and CT head and soft tissue neck were ordered to rule out subarachnoid or any deep tissue infection. Exam also consistent with mild COPD exacerbation.  Work-up ordered to assess concerns above.  Labs and imaging independently interpreted by me and noted below: CBC without leukocytosis or anemia No significant electrolyte derangements or renal sufficiency Negative COVID and influenza Chest x-ray without evidence of pneumonia, pneumothorax, or pleural effusions CT of the chest was negative for PE or pneumonia. CT head negative for bleed CT soft tissue neck negative for any deep tissue infection.  Does not show evidence of otitis media or mastoiditis.  She does have evidence of sinusitis.  Management: IM Toradol and oral Tylenol for pain and low-grade fever DuoNeb for COPD  Reassessment: Upper back pain, neck pain and headache significantly improved Shortness of breath, cough and lung sounds improved after DuoNeb        Assessment/Plan:                                                                                                                                              Viral URI Continue supportive management. Close PCP follow-up as needed  Final Clinical Impression(s) / ED Diagnoses Final diagnoses:  Fever in adult  Headache after cough  Myalgia   The patient appears reasonably screened and/or stabilized for discharge and I doubt any other medical condition or other [facility] requiring further screening, evaluation, or treatment in the ED at this time prior to discharge. Safe for discharge with  strict return precautions.  Disposition: Discharge  Condition: Good  I have discussed the results, Dx and Tx plan with the patient/family who expressed understanding and agree(s) with the plan. Discharge instructions discussed at length. The patient/family was given strict return precautions who verbalized understanding of the instructions. No further questions at time of discharge.    ED Discharge Orders     None         Follow Up: Berkley Harvey, NP [place] [address] [phone]  Call  to schedule an appointment for close follow up  Judith Part, [place] [address] [phone]  Call  as needed to follow up for cervical stenosis  This chart was dictated using voice recognition software.  Despite best efforts to proofread,  errors can occur which can change the documentation meaning.    Fatima Blank, MD 12/15/21 [phone]

## 2021-12-15 NOTE — ED Triage Notes (Addendum)
Pt arrived POV from home c/o having an ear infection and tonight she was unable to sleep and woke up with neck pain and stiffness. Pt states she started antibiotics yesterday. Pt also endorses SHOB.

## 2022-09-10 ENCOUNTER — Other Ambulatory Visit: Payer: Self-pay

## 2022-09-10 ENCOUNTER — Other Ambulatory Visit: Payer: Self-pay | Admitting: Family Medicine

## 2022-09-10 ENCOUNTER — Emergency Department (HOSPITAL_BASED_OUTPATIENT_CLINIC_OR_DEPARTMENT_OTHER): Payer: BLUE CROSS/BLUE SHIELD

## 2022-09-10 ENCOUNTER — Encounter (HOSPITAL_COMMUNITY): Payer: Self-pay | Admitting: Emergency Medicine

## 2022-09-10 ENCOUNTER — Emergency Department (HOSPITAL_BASED_OUTPATIENT_CLINIC_OR_DEPARTMENT_OTHER)
Admission: EM | Admit: 2022-09-10 | Discharge: 2022-09-10 | Disposition: A | Discharge status: Home / Self Care | Payer: BLUE CROSS/BLUE SHIELD | Attending: Emergency Medicine | Admitting: Emergency Medicine

## 2022-09-10 ENCOUNTER — Ambulatory Visit (HOSPITAL_COMMUNITY)
Admission: EM | Admit: 2022-09-10 | Discharge: 2022-09-10 | Disposition: A | Discharge status: Home / Self Care | Payer: BLUE CROSS/BLUE SHIELD

## 2022-09-10 ENCOUNTER — Encounter (HOSPITAL_BASED_OUTPATIENT_CLINIC_OR_DEPARTMENT_OTHER): Payer: Self-pay

## 2022-09-10 DIAGNOSIS — J449 Chronic obstructive pulmonary disease, unspecified: Secondary | ICD-10-CM | POA: Diagnosis not present

## 2022-09-10 DIAGNOSIS — I10 Essential (primary) hypertension: Secondary | ICD-10-CM | POA: Insufficient documentation

## 2022-09-10 DIAGNOSIS — Z79899 Other long term (current) drug therapy: Secondary | ICD-10-CM | POA: Insufficient documentation

## 2022-09-10 DIAGNOSIS — I161 Hypertensive emergency: Secondary | ICD-10-CM | POA: Insufficient documentation

## 2022-09-10 DIAGNOSIS — I160 Hypertensive urgency: Secondary | ICD-10-CM

## 2022-09-10 DIAGNOSIS — E876 Hypokalemia: Secondary | ICD-10-CM | POA: Insufficient documentation

## 2022-09-10 DIAGNOSIS — R1031 Right lower quadrant pain: Secondary | ICD-10-CM | POA: Insufficient documentation

## 2022-09-10 DIAGNOSIS — R1011 Right upper quadrant pain: Secondary | ICD-10-CM

## 2022-09-10 LAB — URINALYSIS, ROUTINE W REFLEX MICROSCOPIC
Bilirubin Urine: NEGATIVE
Glucose, UA: NEGATIVE mg/dL
Hgb urine dipstick: NEGATIVE
Ketones, ur: NEGATIVE mg/dL
Leukocytes,Ua: NEGATIVE
Nitrite: NEGATIVE
Protein, ur: NEGATIVE mg/dL
Specific Gravity, Urine: 1.02 (ref 1.005–1.030)
pH: 7 (ref 5.0–8.0)

## 2022-09-10 LAB — COMPREHENSIVE METABOLIC PANEL
ALT: 26 U/L (ref 0–44)
AST: 18 U/L (ref 15–41)
Albumin: 4.7 g/dL (ref 3.5–5.0)
Alkaline Phosphatase: 120 U/L (ref 38–126)
Anion gap: 10 (ref 5–15)
BUN: 20 mg/dL (ref 8–23)
CO2: 29 mmol/L (ref 22–32)
Calcium: 9.7 mg/dL (ref 8.9–10.3)
Chloride: 103 mmol/L (ref 98–111)
Creatinine, Ser: 0.94 mg/dL (ref 0.44–1.00)
GFR, Estimated: >60 mL/min (ref >60)
Glucose, Bld: 104 mg/dL — ABNORMAL HIGH (ref 70–99)
Potassium: 3.3 mmol/L — ABNORMAL LOW (ref 3.5–5.1)
Sodium: 142 mmol/L (ref 135–145)
Total Bilirubin: 0.8 mg/dL (ref 0.3–1.2)
Total Protein: 7.7 g/dL (ref 6.5–8.1)

## 2022-09-10 LAB — CBC
HCT: 40.2 % (ref 36.0–46.0)
Hemoglobin: 13.6 g/dL (ref 12.0–15.0)
MCH: 31.1 pg (ref 26.0–34.0)
MCHC: 33.8 g/dL (ref 30.0–36.0)
MCV: 92 fL (ref 80.0–100.0)
Platelets: 345 10*3/uL (ref 150–400)
RBC: 4.37 MIL/uL (ref 3.87–5.11)
RDW: 14 % (ref 11.5–15.5)
WBC: 6.8 10*3/uL (ref 4.0–10.5)
nRBC: 0 % (ref 0.0–0.2)

## 2022-09-10 LAB — LIPASE, BLOOD: Lipase: <10 U/L — ABNORMAL LOW (ref 11–51)

## 2022-09-10 MED ORDER — AMLODIPINE BESYLATE 5 MG PO TABS
5.0000 mg | ORAL_TABLET | Freq: Once | ORAL | Status: AC
  Administered 2022-09-10: 5 mg via ORAL
  Filled 2022-09-10: qty 1

## 2022-09-10 MED ORDER — AMLODIPINE BESYLATE 5 MG PO TABS: 5.0000 mg | ORAL_TABLET | Freq: Every day | ORAL | 0 refills | Status: DC

## 2022-09-10 MED ORDER — POTASSIUM CHLORIDE CRYS ER 20 MEQ PO TBCR: 20.0000 meq | EXTENDED_RELEASE_TABLET | Freq: Every day | ORAL | 0 refills | Status: DC

## 2022-09-10 MED ORDER — ALBUTEROL SULFATE (2.5 MG/3ML) 0.083% IN NEBU: 2.5000 mg | INHALATION_SOLUTION | Freq: Three times a day (TID) | RESPIRATORY_TRACT | 0 refills | Status: DC | PRN

## 2022-09-10 MED ORDER — HYDROMORPHONE HCL 1 MG/ML IJ SOLN
1.0000 mg | Freq: Once | INTRAMUSCULAR | Status: AC
  Administered 2022-09-10: 1 mg via INTRAVENOUS
  Filled 2022-09-10: qty 1

## 2022-09-10 MED ORDER — SODIUM CHLORIDE 0.9 % IV BOLUS
1000.0000 mL | Freq: Once | INTRAVENOUS | Status: AC
  Administered 2022-09-10: 1000 mL via INTRAVENOUS

## 2022-09-10 MED ORDER — ALBUTEROL SULFATE HFA 108 (90 BASE) MCG/ACT IN AERS
2.0000 | INHALATION_SPRAY | Freq: Once | RESPIRATORY_TRACT | Status: AC
  Administered 2022-09-10: 2 via RESPIRATORY_TRACT
  Filled 2022-09-10: qty 6.7

## 2022-09-10 MED ORDER — ONDANSETRON HCL 4 MG/2ML IJ SOLN
4.0000 mg | Freq: Once | INTRAMUSCULAR | Status: AC
  Administered 2022-09-10: 4 mg via INTRAVENOUS
  Filled 2022-09-10: qty 2

## 2022-09-10 MED ORDER — ONDANSETRON 4 MG PO TBDP
4.0000 mg | ORAL_TABLET | Freq: Once | ORAL | Status: AC
  Administered 2022-09-10: 4 mg via ORAL
  Filled 2022-09-10: qty 1

## 2022-09-10 MED ORDER — POTASSIUM CHLORIDE CRYS ER 20 MEQ PO TBCR
40.0000 meq | EXTENDED_RELEASE_TABLET | Freq: Once | ORAL | Status: AC
  Administered 2022-09-10: 40 meq via ORAL
  Filled 2022-09-10: qty 2

## 2022-09-10 MED ORDER — IRBESARTAN 150 MG PO TABS: 150.0000 mg | ORAL_TABLET | Freq: Every day | ORAL | 0 refills | Status: DC

## 2022-09-10 MED ORDER — IOHEXOL 300 MG/ML  SOLN
100.0000 mL | Freq: Once | INTRAMUSCULAR | Status: AC | PRN
  Administered 2022-09-10: 80 mL via INTRAVENOUS

## 2022-09-10 NOTE — ED Provider Notes (Signed)
[place]    CSN: [id number] Arrival date & time: 09/10/22  1413      History   Chief Complaint Chief Complaint  Patient presents with   Abdominal Pain    HPI Mariah Foster is a 61 y.o. female.  Patient presenting with right upper quadrant abdominal pain that started yesterday.  Patient reports pain is a 10 out of 10.  Patient states she thought that this pain was related to her spasms that she has had for years, but usually her spasms are generalized all over her body, the pain that she is having now is focused in her right upper quadrant.  Patient denies any fever, nausea, or vomiting.  Patient reports having normal bowel movements.  Patient being sent to the emergency department for further evaluation.    Abdominal Pain Associated symptoms: shortness of breath   Associated symptoms: no chills, no constipation, no diarrhea, no fatigue, no fever, no nausea and no vomiting     Past Medical History:  Diagnosis Date   COPD (chronic obstructive pulmonary disease) ([place])    GI bleed    Hyperlipidemia    Hypertension    Tubular adenoma of colon 2013    Patient Active Problem List   Diagnosis Date Noted   HTN (hypertension) 12/29/2012    Past Surgical History:  Procedure Laterality Date   CESAREAN SECTION  [id number]   x 2    OB History   No obstetric history on file.      Home Medications    Prior to Admission medications   Medication Sig Start Date End Date Taking? Authorizing Provider  acetaminophen (TYLENOL) 500 MG tablet Take 1 tablet (500 mg total) by mouth every 6 (six) hours as needed. Patient not taking: Reported on 12/15/2021 07/01/19   Loura Halt A, NP  albuterol (PROVENTIL HFA;VENTOLIN HFA) 108 (90 Base) MCG/ACT inhaler Inhale 1-2 puffs into the lungs every 6 (six) hours as needed for wheezing or shortness of breath.    [provider]  albuterol (PROVENTIL) (2.5 MG/3ML) 0.083% nebulizer solution Take 2.5 mg by nebulization 3  (three) times daily as needed for wheezing or shortness of breath. 09/27/21   [provider]  amLODipine (NORVASC) 5 MG tablet Take 5 mg by mouth daily.    [provider]  amoxicillin-clavulanate (AUGMENTIN) 875-125 MG tablet Take 1 tablet by mouth every 12 (twelve) hours. Patient taking differently: Take 1 tablet by mouth See admin instructions. Bid x 7 days 05/08/20   Eustaquio Maize, PA-C  atorvastatin (LIPITOR) 10 MG tablet Take 10 mg by mouth daily. 11/29/21   [provider]  baclofen (LIORESAL) 10 MG tablet Take 10 mg by mouth 2 (two) times daily as needed for muscle spasms. 09/15/21   [provider]  cyclobenzaprine (FLEXERIL) 5 MG tablet Take 1 tablet (5 mg total) by mouth 2 (two) times daily as needed for muscle spasms. Patient not taking: Reported on 12/15/2021 01/14/21   Scot Jun, FNP  DULoxetine (CYMBALTA) 30 MG capsule Take 30 mg by mouth 2 (two) times daily. 08/29/21   [provider]  Ferrous Sulfate (IRON) 325 (65 Fe) MG TABS Take 1 tablet (325 mg total) by mouth 2 (two) times daily. Patient taking differently: Take 325 mg by mouth daily. 10/15/17   Ladene Artist, MD  fluticasone (FLONASE) 50 MCG/ACT nasal spray Place 2 sprays into both nostrils daily. Patient not taking: Reported on 12/15/2021 11/06/17   Couture, Cortni S, PA-C  irbesartan (  AVAPRO) 150 MG tablet Take 150 mg by mouth daily. 08/30/21   [provider]  levothyroxine (SYNTHROID) 75 MCG tablet Take 75 mcg by mouth daily. 08/29/21   [provider]  omeprazole (PRILOSEC) 40 MG capsule TAKE 1 CAPSULE BY MOUTH ONCE DAILY Patient not taking: Reported on 12/15/2021 09/11/18   Levin Erp, PA  potassium chloride (KLOR-CON M10) 10 MEQ tablet Take 1 tablet (10 mEq total) by mouth daily for 5 doses. Patient not taking: Reported on 12/15/2021 05/08/20 05/13/20  Eustaquio Maize, PA-C  traMADol (ULTRAM) 50 MG tablet Take 50 mg by mouth every 6 (six) hours  as needed. 11/29/21   [provider]  traZODone (DESYREL) 50 MG tablet Take 50-100 mg by mouth at bedtime as needed for sleep. 09/27/21   [provider]  potassium chloride (K-DUR) 10 MEQ tablet Take 1 tablet (10 mEq total) by mouth daily. 03/14/17 05/26/19  Volanda Napoleon, PA-C    Family History Family History  Problem Relation Age of Onset   Heart attack Father    Diabetes Sister        x 2   Diabetes Brother     Social History Social History   Tobacco Use   Smoking status: Former    Packs/day: 0.20    Types: Cigarettes   Smokeless tobacco: Never  Vaping Use   Vaping Use: Never used  Substance Use Topics   Alcohol use: Yes    Comment: socially   Drug use: No     Allergies   Flagyl [metronidazole] and Metronidazole hcl   Review of Systems Review of Systems  Constitutional:  Positive for activity change. Negative for appetite change, chills, fatigue and fever.  Respiratory:  Positive for shortness of breath.   Gastrointestinal:  Positive for abdominal distention and abdominal pain. Negative for blood in stool, constipation, diarrhea, nausea and vomiting.  Genitourinary: Negative.      Physical Exam Triage Vital Signs ED Triage Vitals  Enc Vitals Group     BP 09/10/22 1604 (!) 186/97     Pulse Rate 09/10/22 1604 81     Resp 09/10/22 1600 (!) 28     Temp 09/10/22 1604 98.5 F (36.9 C)     Temp Source 09/10/22 1604 Oral     SpO2 09/10/22 1604 98 %     Weight --      Height --      Head Circumference --      Peak Flow --      Pain Score 09/10/22 1602 7     Pain Loc --      Pain Edu? --      Excl. in [place]? --    No data found.  Updated Vital Signs BP (!) 186/97 (BP Location: Left Arm)   Pulse 81   Temp 98.5 F (36.9 C) (Oral)   Resp (!) 24   SpO2 98%   Physical Exam Vitals and nursing note reviewed.  Abdominal:     General: Bowel sounds are normal.     Tenderness: There is abdominal tenderness in the right upper quadrant. There  is rebound. There is no guarding. Positive signs include Murphy's sign. Negative signs include McBurney's sign.      UC Treatments / Results  Labs (all labs ordered are listed, but only abnormal results are displayed) Labs Reviewed - No data to display  EKG   Radiology No results found.  Procedures Procedures (including critical care time)  Medications Ordered in UC  Medications - No data to display  Initial Impression / Assessment and Plan / UC Course  I have reviewed the triage vital signs and the nursing notes.  Pertinent labs & imaging results that were available during my care of the patient were reviewed by me and considered in my medical decision making (see chart for details).     Patient being sent to emergency department for stat lab work and a CT of abdomen, and a higher level of care.  Patient's friend is going to transport patient to the hospital.  Final Clinical Impressions(s) / UC Diagnoses   Final diagnoses:  Right upper quadrant abdominal pain     Discharge Instructions      Please go to [facility] emergency department for further evaluation, stat lab work, and a CT of your abdomen.      ED Prescriptions   None    PDMP not reviewed this encounter.   Flossie Dibble, NP 09/10/22 [phone]

## 2022-09-10 NOTE — Discharge Instructions (Signed)
Please go to [facility] emergency department for further evaluation, stat lab work, and a CT of your abdomen.

## 2022-09-10 NOTE — ED Triage Notes (Signed)
Patient is complaining of spasms around torso.  Patient reports she has had these episodes for "years".  Episodes make is difficult to breathe, patient has copd.  Patient reports she takes medicine for spasms, but is out of medicine.  Reports pcp hasn't returned her call

## 2022-09-10 NOTE — ED Provider Notes (Signed)
[facility] EMERGENCY DEPT Provider Note   CSN: [id number] Arrival date & time: 09/10/22  1708     History  Chief Complaint  Patient presents with   Abdominal Pain    Mariah Foster is a 61 y.o. female.  HPI 61 year old female presents with a chief plaint of right upper quadrant abdominal pain.  Is been ongoing for quite some time, multiple years, and bothers her multiple times a week.  Sometimes worse than others.  However since yesterday has been more severe than typical going into today.  No fevers COVID chest pain.  She has chronic dyspnea from COPD but has run out of her inhaler.  She has also run out of her muscle spasm medicine as this is attributed mostly to muscle spasms.  However she states she is seen distention in her right upper quadrant and was at one point told she had a hernia.  It is currently rated as a 10 out of 10.  Home Medications Prior to Admission medications   Medication Sig Start Date End Date Taking? Authorizing Provider  acetaminophen (TYLENOL) 500 MG tablet Take 1 tablet (500 mg total) by mouth every 6 (six) hours as needed. Patient not taking: Reported on 12/15/2021 07/01/19   Loura Halt A, NP  albuterol (PROVENTIL HFA;VENTOLIN HFA) 108 (90 Base) MCG/ACT inhaler Inhale 1-2 puffs into the lungs every 6 (six) hours as needed for wheezing or shortness of breath.    [provider]  albuterol (PROVENTIL) (2.5 MG/3ML) 0.083% nebulizer solution Take 3 mLs (2.5 mg total) by nebulization 3 (three) times daily as needed for wheezing or shortness of breath. 09/10/22   Sherwood Gambler, MD  amLODipine (NORVASC) 5 MG tablet Take 1 tablet (5 mg total) by mouth daily. 09/10/22   Sherwood Gambler, MD  amoxicillin-clavulanate (AUGMENTIN) 875-125 MG tablet Take 1 tablet by mouth every 12 (twelve) hours. Patient taking differently: Take 1 tablet by mouth See admin instructions. Bid x 7 days 05/08/20   Eustaquio Maize, PA-C  atorvastatin (LIPITOR) 10 MG  tablet Take 10 mg by mouth daily. 11/29/21   [provider]  baclofen (LIORESAL) 10 MG tablet Take 10 mg by mouth 2 (two) times daily as needed for muscle spasms. 09/15/21   [provider]  cyclobenzaprine (FLEXERIL) 5 MG tablet Take 1 tablet (5 mg total) by mouth 2 (two) times daily as needed for muscle spasms. Patient not taking: Reported on 12/15/2021 01/14/21   Scot Jun, FNP  DULoxetine (CYMBALTA) 30 MG capsule Take 30 mg by mouth 2 (two) times daily. 08/29/21   [provider]  Ferrous Sulfate (IRON) 325 (65 Fe) MG TABS Take 1 tablet (325 mg total) by mouth 2 (two) times daily. Patient taking differently: Take 325 mg by mouth daily. 10/15/17   Ladene Artist, MD  fluticasone (FLONASE) 50 MCG/ACT nasal spray Place 2 sprays into both nostrils daily. Patient not taking: Reported on 12/15/2021 11/06/17   Couture, Cortni S, PA-C  irbesartan (AVAPRO) 150 MG tablet Take 1 tablet (150 mg total) by mouth daily. 09/10/22   Sherwood Gambler, MD  levothyroxine (SYNTHROID) 75 MCG tablet Take 75 mcg by mouth daily. 08/29/21   [provider]  omeprazole (PRILOSEC) 40 MG capsule TAKE 1 CAPSULE BY MOUTH ONCE DAILY Patient not taking: Reported on 12/15/2021 09/11/18   Levin Erp, PA  potassium chloride (KLOR-CON M10) 10 MEQ tablet Take 1 tablet (10 mEq total) by mouth daily for 5 doses. Patient not taking: Reported on 12/15/2021  05/08/20 05/13/20  Eustaquio Maize, PA-C  traMADol (ULTRAM) 50 MG tablet Take 50 mg by mouth every 6 (six) hours as needed. 11/29/21   [provider]  traZODone (DESYREL) 50 MG tablet Take 50-100 mg by mouth at bedtime as needed for sleep. 09/27/21   [provider]  potassium chloride (K-DUR) 10 MEQ tablet Take 1 tablet (10 mEq total) by mouth daily. 03/14/17 05/26/19  Volanda Napoleon, PA-C      Allergies    Flagyl [metronidazole] and Metronidazole hcl    Review of Systems   Review of Systems  Constitutional:   Negative for fever.  Respiratory:  Positive for shortness of breath (chronic).   Cardiovascular:  Negative for chest pain.  Gastrointestinal:  Positive for abdominal pain and nausea. Negative for vomiting.  All other systems reviewed and are negative.   Physical Exam Updated Vital Signs BP (!) 199/90   Pulse 69   Temp 97.9 F (36.6 C)   Resp 19   Ht '5\' 4"'$  (1.626 m)   Wt 65.8 kg   SpO2 94%   BMI 24.89 kg/m  Physical Exam Vitals and nursing note reviewed.  Constitutional:      General: She is not in acute distress.    Appearance: She is well-developed. She is not ill-appearing or diaphoretic.  HENT:     Head: Normocephalic and atraumatic.  Cardiovascular:     Rate and Rhythm: Normal rate and regular rhythm.     Heart sounds: Normal heart sounds.  Pulmonary:     Effort: Pulmonary effort is normal.     Breath sounds: No wheezing.  Abdominal:     Palpations: Abdomen is soft.     Tenderness: There is abdominal tenderness in the right upper quadrant.     Hernia: No hernia is present.  Skin:    General: Skin is warm and dry.  Neurological:     Mental Status: She is alert.     ED Results / Procedures / Treatments   Labs (all labs ordered are listed, but only abnormal results are displayed) Labs Reviewed  LIPASE, BLOOD - Abnormal; Notable for the following components:      Result Value   Lipase <10 (*)    All other components within normal limits  COMPREHENSIVE METABOLIC PANEL - Abnormal; Notable for the following components:   Potassium 3.3 (*)    Glucose, Bld 104 (*)    All other components within normal limits  URINALYSIS, ROUTINE W REFLEX MICROSCOPIC - Abnormal; Notable for the following components:   Color, Urine COLORLESS (*)    All other components within normal limits  CBC    EKG EKG Interpretation  Date/Time:  Monday September 10 2022 17:58:17 EST Ventricular Rate:  74 PR Interval:  144 QRS Duration: 70 QT Interval:  400 QTC Calculation: 444 R  Axis:   85 Text Interpretation: Normal sinus rhythm Nonspecific T wave abnormality  Rate is slower, otherwise T wave changes similar to Feb 2023 Confirmed by Sherwood Gambler [phone]) on 09/10/2022 8:21:23 PM  Radiology CT ABDOMEN PELVIS W CONTRAST  Result Date: 09/10/2022 CLINICAL DATA:  Hernia suspected, abdominal wall EXAM: CT ABDOMEN AND PELVIS WITH CONTRAST TECHNIQUE: Multidetector CT imaging of the abdomen and pelvis was performed using the standard protocol following bolus administration of intravenous contrast. RADIATION DOSE REDUCTION: This exam was performed according to the departmental dose-optimization program which includes automated exposure control, adjustment of the mA and/or kV according to patient size and/or use of iterative reconstruction technique.  CONTRAST:  85m OMNIPAQUE IOHEXOL 300 MG/ML  SOLN COMPARISON:  CT angio chest 12/15/2021, CT abdomen pelvis 05/08/2020 FINDINGS: Lower chest: No acute abnormality. Hepatobiliary: Couple pericentimeter fluid density lesions within the left hepatic lobe likely represent simple hepatic cysts. No gallstones, gallbladder wall thickening, or pericholecystic fluid. No biliary dilatation. Pancreas: No focal lesion. Normal pancreatic contour. No surrounding inflammatory changes. No main pancreatic ductal dilatation. Spleen: Normal in size without focal abnormality. Adrenals/Urinary Tract: No adrenal nodule bilaterally. Bilateral kidneys enhance symmetrically. Subcentimeter hypodensity within left kidney too small to characterize. No hydronephrosis. No hydroureter. The urinary bladder is unremarkable. Stomach/Bowel: Stomach is within normal limits. No evidence of bowel wall thickening or dilatation. Appendix appears normal. Vascular/Lymphatic: No abdominal aorta or iliac aneurysm. Moderate to severe atherosclerotic plaque of the aorta and its branches. No abdominal, pelvic, or inguinal lymphadenopathy. Reproductive: Uterus and bilateral adnexa are  unremarkable. Other: No intraperitoneal free fluid. No intraperitoneal free gas. No organized fluid collection. Musculoskeletal: No abdominal wall hernia or abnormality. No suspicious lytic or blastic osseous lesions. No acute displaced fracture. Straightening of the normal lumbar lordosis likely due to positioning and degenerative changes. Multilevel degenerative changes of the spine with intervertebral disc space vacuum phenomenon at the L5-S1 level. IMPRESSION: 1. No abdominal wall hernia identified. 2. No acute intra-abdominal or intrapelvic abnormality. 3.  Aortic Atherosclerosis (ICD10-I70.0). Electronically Signed   By: MIven FinnM.D.   On: 09/10/2022 22:08    Procedures Procedures    Medications Ordered in ED Medications  albuterol (VENTOLIN HFA) 108 (90 Base) MCG/ACT inhaler 2 puff (2 puffs Inhalation Given 09/10/22 2043)  HYDROmorphone (DILAUDID) injection 1 mg (1 mg Intravenous Given 09/10/22 2046)  sodium chloride 0.9 % bolus 1,000 mL (0 mLs Intravenous Stopped 09/10/22 2239)  iohexol (OMNIPAQUE) 300 MG/ML solution 100 mL (80 mLs Intravenous Contrast Given 09/10/22 2051)  potassium chloride SA (KLOR-CON M) CR tablet 40 mEq (40 mEq Oral Given 09/10/22 2254)  amLODipine (NORVASC) tablet 5 mg (5 mg Oral Given 09/10/22 2254)  ondansetron (ZOFRAN-ODT) disintegrating tablet 4 mg (4 mg Oral Given 09/10/22 2253)  ondansetron (ZOFRAN) injection 4 mg (4 mg Intravenous Given 09/10/22 2307)    ED Course/ Medical Decision Making/ A&P                           Medical Decision Making Amount and/or Complexity of Data Reviewed Labs: ordered. Radiology: ordered.  Risk Prescription drug management.   Labs are overall unremarkable with a normal WBC, normal LFTs and normal lipase.  Mild hypokalemia was repleted.  She does have focal right upper quadrant tenderness and based on patient's report and chart review it seems like this is more of a chronic problem but worse today.  I do not  appreciate a hernia but given age and severity of pain a CT was obtained.  I personally viewed/interpreted these images and there is no evidence of an abdominal wall hernia or other acute emergent condition.  Given she is continuing to have pain we will get a right upper quadrant ultrasound tomorrow but this is not available tonight.  However she otherwise appears to have good pain control and appears stable for discharge.  She was complaining of nausea after the Dilaudid so she was given Zofran though did vomit so she was given IV Zofran.  Otherwise she is noted to be pretty hypertensive.  It seems like a lot of her issues are having run out of her meds over the  last several weeks including 2 different blood pressure meds.  We do not have Avapro here so we will only be able to give her the amlodipine but I told her I would prescribe her blood pressure meds for a month and that she needs to be talking to her doctor about refills.  Chart review shows she did talk to her doctor about a refill Flexeril but not her blood pressure meds.  Stressed the importance of blood pressure management.  However she does not have any signs/symptoms of endorgan damage and appears stable for discharge home.  Of note, during the active vomiting and when I reassessed her she told me that she actually threw up the amlodipine and potassium.  I offered to read give these to her but she declines and just wants to go home and go to bed.  She is no longer vomiting.  I think the hypertension is more of a chronic problem and thus think this is reasonable though I did stress the importance of the blood pressure.  We will prescribe her a short course of potassium at home as well.       Final Clinical Impression(s) / ED Diagnoses Final diagnoses:  Right upper quadrant pain  Hypertensive urgency    Rx / DC Orders ED Discharge Orders          Ordered    albuterol (PROVENTIL) (2.5 MG/3ML) 0.083% nebulizer solution  3 times daily PRN         09/10/22 2249    amLODipine (NORVASC) 5 MG tablet  Daily        09/10/22 2249    irbesartan (AVAPRO) 150 MG tablet  Daily        09/10/22 2249    US Abdomen Limited RUQ/Gall Bladder        09/10/22 2255              Sherwood Gambler, MD 09/10/22 2321

## 2022-09-10 NOTE — Discharge Instructions (Addendum)
I have prescribed your 2 blood pressure medicines to help cover you while you are waiting on refills from your primary care physician.  Is very important to take these as instructed as not taking her blood pressure medicines appropriately can lead to many problems such as stroke, kidney failure, heart attack, and other serious emergencies.  IMPORTANT PATIENT INSTRUCTIONS:  You have been scheduled for an Outpatient Ultrasound.    Your appointment has been scheduled for:  _______ am/pm on _______________ (date).  If your appointment is scheduled for a Saturday, Sunday or holiday, please go to the [employer] at [facility] Emergency Department Registration Desk at least 15 minutes prior to your appointment time and tell them you are there for an ultrasound.    If your appointment is scheduled for a weekday (Monday - Friday), please go directly to the [facility] at [facility] Radiology Department reception area at least 15 minutes prior to your appointment time and tell them you are there for an ultrasound.  Please call [phone] with questions.

## 2022-09-10 NOTE — ED Notes (Signed)
Patient transported to CT 

## 2022-09-10 NOTE — ED Notes (Signed)
Patient is being discharged from the Urgent Care and sent to the Emergency Department via POV. Per Juventino Slovak, NP, patient is in need of higher level of care due to abdominal pain. Patient is aware and verbalizes understanding of plan of care.  Vitals:   09/10/22 1600 09/10/22 1604  BP:  (!) 186/97  Pulse:  81  Resp: (!) 28 (!) 24  Temp:  98.5 F (36.9 C)  SpO2:  98%

## 2022-09-10 NOTE — ED Triage Notes (Signed)
Pt reports RUQ pain x years worsening today. Pt states that pain "travels all over my body" and describes pain as a "spasm".

## 2022-09-10 NOTE — ED Notes (Signed)
Pt vomited x 1, md notified and meds ordered.

## 2022-09-14 ENCOUNTER — Ambulatory Visit (HOSPITAL_BASED_OUTPATIENT_CLINIC_OR_DEPARTMENT_OTHER): Admit: 2022-09-14 | Payer: BLUE CROSS/BLUE SHIELD

## 2022-10-01 ENCOUNTER — Emergency Department (HOSPITAL_COMMUNITY)
Admission: EM | Admit: 2022-10-01 | Discharge: 2022-10-01 | Disposition: A | Discharge status: Home / Self Care | Payer: BLUE CROSS/BLUE SHIELD | Attending: Emergency Medicine | Admitting: Emergency Medicine

## 2022-10-01 ENCOUNTER — Other Ambulatory Visit: Payer: Self-pay

## 2022-10-01 ENCOUNTER — Emergency Department (HOSPITAL_COMMUNITY): Payer: BLUE CROSS/BLUE SHIELD

## 2022-10-01 ENCOUNTER — Encounter (HOSPITAL_COMMUNITY): Payer: Self-pay | Admitting: Emergency Medicine

## 2022-10-01 DIAGNOSIS — M62838 Other muscle spasm: Secondary | ICD-10-CM | POA: Insufficient documentation

## 2022-10-01 DIAGNOSIS — Z79899 Other long term (current) drug therapy: Secondary | ICD-10-CM | POA: Diagnosis not present

## 2022-10-01 DIAGNOSIS — R197 Diarrhea, unspecified: Secondary | ICD-10-CM | POA: Insufficient documentation

## 2022-10-01 DIAGNOSIS — R252 Cramp and spasm: Secondary | ICD-10-CM | POA: Diagnosis present

## 2022-10-01 DIAGNOSIS — I10 Essential (primary) hypertension: Secondary | ICD-10-CM | POA: Insufficient documentation

## 2022-10-01 DIAGNOSIS — N179 Acute kidney failure, unspecified: Secondary | ICD-10-CM | POA: Diagnosis not present

## 2022-10-01 DIAGNOSIS — Z87891 Personal history of nicotine dependence: Secondary | ICD-10-CM | POA: Diagnosis not present

## 2022-10-01 LAB — COMPREHENSIVE METABOLIC PANEL
ALT: 28 U/L (ref 0–44)
AST: 24 U/L (ref 15–41)
Albumin: 4.4 g/dL (ref 3.5–5.0)
Alkaline Phosphatase: 116 U/L (ref 38–126)
Anion gap: 8 (ref 5–15)
BUN: 21 mg/dL (ref 8–23)
CO2: 28 mmol/L (ref 22–32)
Calcium: 9.3 mg/dL (ref 8.9–10.3)
Chloride: 106 mmol/L (ref 98–111)
Creatinine, Ser: 1.3 mg/dL — ABNORMAL HIGH (ref 0.44–1.00)
GFR, Estimated: 46 mL/min — ABNORMAL LOW (ref >60)
Glucose, Bld: 106 mg/dL — ABNORMAL HIGH (ref 70–99)
Potassium: 3.8 mmol/L (ref 3.5–5.1)
Sodium: 142 mmol/L (ref 135–145)
Total Bilirubin: 0.7 mg/dL (ref 0.3–1.2)
Total Protein: 7.8 g/dL (ref 6.5–8.1)

## 2022-10-01 LAB — CBC
HCT: 42.8 % (ref 36.0–46.0)
Hemoglobin: 14.4 g/dL (ref 12.0–15.0)
MCH: 31.2 pg (ref 26.0–34.0)
MCHC: 33.6 g/dL (ref 30.0–36.0)
MCV: 92.6 fL (ref 80.0–100.0)
Platelets: 349 10*3/uL (ref 150–400)
RBC: 4.62 MIL/uL (ref 3.87–5.11)
RDW: 14 % (ref 11.5–15.5)
WBC: 7.6 10*3/uL (ref 4.0–10.5)
nRBC: 0 % (ref 0.0–0.2)

## 2022-10-01 LAB — LIPASE, BLOOD: Lipase: 24 U/L (ref 11–51)

## 2022-10-01 MED ORDER — ONDANSETRON 4 MG PO TBDP
4.0000 mg | ORAL_TABLET | Freq: Once | ORAL | Status: AC
  Administered 2022-10-01: 4 mg via ORAL
  Filled 2022-10-01: qty 1

## 2022-10-01 MED ORDER — DICYCLOMINE HCL 20 MG PO TABS: 20.0000 mg | ORAL_TABLET | Freq: Three times a day (TID) | ORAL | 0 refills | Status: DC

## 2022-10-01 MED ORDER — OXYCODONE-ACETAMINOPHEN 5-325 MG PO TABS
1.0000 | ORAL_TABLET | Freq: Once | ORAL | Status: AC
  Administered 2022-10-01: 1 via ORAL
  Filled 2022-10-01: qty 1

## 2022-10-01 MED ORDER — ONDANSETRON HCL 4 MG PO TABS: 4.0000 mg | ORAL_TABLET | Freq: Three times a day (TID) | ORAL | 0 refills | Status: AC | PRN

## 2022-10-01 NOTE — ED Notes (Signed)
Patient verbalizes understanding of discharge instructions. Opportunity for questioning and answers were provided. Armband removed by staff, pt discharged from ED. Pt taken to ED entrance via wheel chair.  

## 2022-10-01 NOTE — Discharge Instructions (Addendum)
Thank you for letting us take care of you today.  As discussed, it is very important you follow-up with GI for further management of your chronic abdominal pain. Your labs and imaging were reassuring, however, you  may need additional testing through your PCP or a GI specialist to identify the cause of your abdominal pain.  Please take the medication sent to your pharmacy as prescribed. It is also important you follow-up with your PCP for further management of your declining kidney function.

## 2022-10-01 NOTE — ED Provider Triage Note (Signed)
Emergency Medicine Provider Triage Evaluation Note  Mariah Foster , a 61 y.o. female  was evaluated in triage.  Pt complains of right upper quadrant pain.  Patient states she has a history of right upper quadrant muscle spasms but that today feels different.  She complains of sharp pain under the right sided rib cage/upper abdomen on the right side.  Patient also complains of nausea, vomiting, and diarrhea.  She denies any change to her diet.    Review of Systems  Positive: As above Negative: As above  Physical Exam  BP (!) 157/92 (BP Location: Right Arm)   Pulse 85   Temp 97.8 F (36.6 C) (Oral)   Resp 18   Ht '5\' 4"'$  (1.626 m)   Wt 66 kg   SpO2 96%   BMI 24.98 kg/m  Gen:   Awake, no distress   Resp:  Normal effort  MSK:   Moves extremities without difficulty  Other:  RUQ TTP  Medical Decision Making  Medically screening exam initiated at 3:58 PM.  Appropriate orders placed.  Mariah Foster was informed that the remainder of the evaluation will be completed by another provider, this initial triage assessment does not replace that evaluation, and the importance of remaining in the ED until their evaluation is complete.     Dorothyann Peng, PA-C 10/01/22 1604

## 2022-10-01 NOTE — ED Provider Notes (Signed)
[place] EMERGENCY DEPARTMENT Provider Note   CSN: [id number] Arrival date & time: 10/01/22  1328     History  Chief Complaint  Patient presents with   Spasms    Mariah Foster is a 61 y.o. female with past medical history hypertension, hyperlipidemia, iron deficiency anemia, former tobacco abuse who presents to the ED complaining of right upper quadrant "spasms."  She states that she has had this pain for over a month and been evaluated in the ED for the exact same pain before in November 2023 when she had a CT abdomen pelvis.  Abdominal pain is associated with nausea and approximately 2-3 episodes of diarrhea per day.  She says for the last few days that she has had a couple episodes of vomiting and notes that her abdominal pain worsens soon after she eats.  For this reason, she has not been eating or drinking as much as she normally does.  She denies dizziness, chest pain, shortness of breath, syncope, recent illness, recent travel, known sick contacts.  She says she was also evaluated for this complaint by her primary care provider approximately 3 weeks ago who also did not know what was going on though noted she had declining kidney function.  She has been unable to follow-up with them again due to poor financial circumstances.  She came to ED today for better management of her pain as she has not been taking any pain medicine or nausea medication at home.  Prior imaging reviewed which showed no abdominal hernia or other identifiable acute cause of her pain but did show aortic atherosclerosis that has not previously been worked up.  Patient denies chest pain, dyspnea, palpitations, change in activity, or history of blood clots. She denies being previously evaluated by GI for her symptoms.       Home Medications Prior to Admission medications   Medication Sig Start Date End Date Taking? Authorizing Provider  dicyclomine (BENTYL) 20 MG tablet Take 1 tablet (20 mg  total) by mouth 3 (three) times daily before meals. 10/01/22  Yes Dandra Velardi L, PA-C  ondansetron (ZOFRAN) 4 MG tablet Take 1 tablet (4 mg total) by mouth every 8 (eight) hours as needed for up to 5 days for nausea or vomiting. 10/01/22 10/06/22 Yes Zyair Rhein L, PA-C  acetaminophen (TYLENOL) 500 MG tablet Take 1 tablet (500 mg total) by mouth every 6 (six) hours as needed. Patient not taking: Reported on 12/15/2021 07/01/19   Loura Halt A, NP  albuterol (PROVENTIL HFA;VENTOLIN HFA) 108 (90 Base) MCG/ACT inhaler Inhale 1-2 puffs into the lungs every 6 (six) hours as needed for wheezing or shortness of breath.    [provider]  albuterol (PROVENTIL) (2.5 MG/3ML) 0.083% nebulizer solution Take 3 mLs (2.5 mg total) by nebulization 3 (three) times daily as needed for wheezing or shortness of breath. 09/10/22   Sherwood Gambler, MD  amLODipine (NORVASC) 5 MG tablet Take 1 tablet (5 mg total) by mouth daily. 09/10/22   Sherwood Gambler, MD  amoxicillin-clavulanate (AUGMENTIN) 875-125 MG tablet Take 1 tablet by mouth every 12 (twelve) hours. Patient taking differently: Take 1 tablet by mouth See admin instructions. Bid x 7 days 05/08/20   Eustaquio Maize, PA-C  atorvastatin (LIPITOR) 10 MG tablet Take 10 mg by mouth daily. 11/29/21   [provider]  baclofen (LIORESAL) 10 MG tablet Take 10 mg by mouth 2 (two) times daily as needed for muscle spasms. 09/15/21   [provider]  cyclobenzaprine (FLEXERIL) 5 MG tablet Take 1 tablet (5 mg total) by mouth 2 (two) times daily as needed for muscle spasms. Patient not taking: Reported on 12/15/2021 01/14/21   Scot Jun, FNP  DULoxetine (CYMBALTA) 30 MG capsule Take 30 mg by mouth 2 (two) times daily. 08/29/21   [provider]  Ferrous Sulfate (IRON) 325 (65 Fe) MG TABS Take 1 tablet (325 mg total) by mouth 2 (two) times daily. Patient taking differently: Take 325 mg by mouth daily. 10/15/17   Ladene Artist, MD   fluticasone (FLONASE) 50 MCG/ACT nasal spray Place 2 sprays into both nostrils daily. Patient not taking: Reported on 12/15/2021 11/06/17   Couture, Cortni S, PA-C  irbesartan (AVAPRO) 150 MG tablet Take 1 tablet (150 mg total) by mouth daily. 09/10/22   Sherwood Gambler, MD  levothyroxine (SYNTHROID) 75 MCG tablet Take 75 mcg by mouth daily. 08/29/21   [provider]  omeprazole (PRILOSEC) 40 MG capsule TAKE 1 CAPSULE BY MOUTH ONCE DAILY Patient not taking: Reported on 12/15/2021 09/11/18   Levin Erp, PA  potassium chloride SA (KLOR-CON M) 20 MEQ tablet Take 1 tablet (20 mEq total) by mouth daily. 09/10/22   Sherwood Gambler, MD  traMADol (ULTRAM) 50 MG tablet Take 50 mg by mouth every 6 (six) hours as needed. 11/29/21   [provider]  traZODone (DESYREL) 50 MG tablet Take 50-100 mg by mouth at bedtime as needed for sleep. 09/27/21   [provider]  potassium chloride (K-DUR) 10 MEQ tablet Take 1 tablet (10 mEq total) by mouth daily. 03/14/17 05/26/19  Volanda Napoleon, PA-C      Allergies    Flagyl [metronidazole] and Metronidazole hcl    Review of Systems   Review of Systems  Constitutional:  Positive for appetite change. Negative for activity change, chills, diaphoresis, fatigue, fever and unexpected weight change.  HENT:  Negative for ear pain and sore throat.   Eyes:  Negative for pain and visual disturbance.  Respiratory:  Negative for cough, chest tightness and shortness of breath.   Cardiovascular:  Negative for chest pain, palpitations and leg swelling.  Gastrointestinal:  Positive for abdominal pain, diarrhea, nausea and vomiting.  Genitourinary:  Negative for dysuria and hematuria.  Musculoskeletal:  Negative for back pain.  Skin:  Negative for color change and rash.  Neurological:  Negative for dizziness, seizures, syncope, speech difficulty, weakness and light-headedness.  All other systems reviewed and are negative.   Physical  Exam Updated Vital Signs BP (!) 170/90   Pulse 90   Temp 97.6 F (36.4 C) (Oral)   Resp 17   Ht '5\' 4"'$  (1.626 m)   Wt 66 kg   SpO2 100%   BMI 24.98 kg/m  Physical Exam Vitals and nursing note reviewed.  Constitutional:      General: She is not in acute distress.    Appearance: Normal appearance. She is not ill-appearing, toxic-appearing or diaphoretic.  HENT:     Head: Normocephalic and atraumatic.     Mouth/Throat:     Mouth: Mucous membranes are dry.  Eyes:     General: No scleral icterus.    Extraocular Movements: Extraocular movements intact.     Conjunctiva/sclera: Conjunctivae normal.  Cardiovascular:     Rate and Rhythm: Normal rate and regular rhythm.     Heart sounds: Normal heart sounds. No murmur heard. Pulmonary:     Effort: Pulmonary effort is normal. No respiratory distress.     Breath sounds:  Normal breath sounds. No wheezing, rhonchi or rales.  Abdominal:     General: Abdomen is flat. Bowel sounds are normal. There is distension (mild generalized).     Palpations: There is no mass.     Tenderness: There is abdominal tenderness (mild right upper quadrant, remainder of abd non-tender). There is no guarding or rebound.     Hernia: No hernia is present.  Musculoskeletal:        General: Normal range of motion.     Cervical back: Normal range of motion and neck supple.     Right lower leg: No edema.     Left lower leg: No edema.  Skin:    General: Skin is warm and dry.     Capillary Refill: Capillary refill takes less than 2 seconds.     Coloration: Skin is not jaundiced.  Neurological:     Mental Status: She is alert. Mental status is at baseline.  Psychiatric:        Mood and Affect: Mood normal.        Behavior: Behavior normal.     ED Results / Procedures / Treatments   Labs (all labs ordered are listed, but only abnormal results are displayed) Labs Reviewed  COMPREHENSIVE METABOLIC PANEL - Abnormal; Notable for the following components:       Result Value   Glucose, Bld 106 (*)    Creatinine, Ser 1.30 (*)    GFR, Estimated 46 (*)    All other components within normal limits  LIPASE, BLOOD  CBC  URINALYSIS, ROUTINE W REFLEX MICROSCOPIC    EKG EKG Interpretation  Date/Time:  Monday October 01 2022 15:52:35 EST Ventricular Rate:  93 PR Interval:  138 QRS Duration: 64 QT Interval:  372 QTC Calculation: 462 R Axis:   76 Text Interpretation: Normal sinus rhythm Septal infarct , age undetermined Abnormal ECG No significant change since last tracing Confirmed by Deno Etienne [phone]) on 10/01/2022 7:40:29 PM  Radiology US Abdomen Limited RUQ (LIVER/GB)  Result Date: 10/01/2022 CLINICAL DATA:  144818 RUQ pain 151471 EXAM: ULTRASOUND ABDOMEN LIMITED RIGHT UPPER QUADRANT COMPARISON:  CT abdomen pelvis 10/10/2022. FINDINGS: Gallbladder: No gallstones or wall thickening visualized. No sonographic Murphy sign noted by sonographer. Common bile duct: Diameter: 3 mm Liver: No focal lesion identified. Within normal limits in parenchymal echogenicity. Portal vein is patent on color Doppler imaging with normal direction of blood flow towards the liver. Other: None. IMPRESSION: Unremarkable right upper quadrant ultrasound. Electronically Signed   By: Iven Finn M.D.   On: 10/01/2022 17:36    Procedures None  Medications Ordered in ED Medications  oxyCODONE-acetaminophen (PERCOCET/ROXICET) 5-325 MG per tablet 1 tablet (1 tablet Oral Given 10/01/22 2024)  ondansetron (ZOFRAN-ODT) disintegrating tablet 4 mg (4 mg Oral Given 10/01/22 2025)    ED Course/ Medical Decision Making/ A&P                           Medical Decision Making Amount and/or Complexity of Data Reviewed Labs: ordered. Decision-making details documented in ED Course. Radiology: ordered. Decision-making details documented in ED Course. ECG/medicine tests: ordered. Decision-making details documented in ED Course.  Risk Prescription drug management.   This is a 61  year old female who returns to ED for the same symptoms she was evaluated for in November 2023. Pt states she returns because no cause for her pain has been identified yet and it became unmanageable this morning. She has had mild  vomiting and diarrhea with difficulty tolerating PO secondary to her pain. Mucus membranes are mildly dry on exam but pt would like to focus on getting her pain under control and request a diet stating she is hungry and thirsty. She is able to tolerate PO in ED without difficulty. Prior CT abd/pelvis and labs reviewed and there has been no identified cause of her chronic abdominal pain. Small hepatic cysts identified on CT but do not suspect these would be origin of her pain. Without further infectious symptoms and chronic onset, do not suspect a colitis though pt does have a history of this with some diarrhea but it is unclear if this is normal for her. Gallbladder appears within normal limits on prior CT and RUQ [place] today. She does have a mild AKI I suspect is secondary to dehydration in conjunction with her known declining kidney function. Pt would not like to stay in the hospital and wants to go home and follow-up outpatient as long as she has something to control her symptoms. Significant relief with one dose Percocet in ED. Discharged home on Bentyl to help with abdominal "spasms" and Zofran so pt can continue advancing diet. Given strict return precautions and long discussion with her of importance of following up with both her primary care and GI as symptoms may be indicative of a chronic mesenteric ischemia with her risk factors of HTN, HLD, prior tobacco abuse, and age. Pt agreeable with this plan and will arrange follow-up this week. She will return to ED for new or concerning symptoms. Case discussed with attending physician who agreed with plan.          Final Clinical Impression(s) / ED Diagnoses Final diagnoses:  Muscle spasm  Acute kidney injury [facility])    Rx / DC  Orders ED Discharge Orders          Ordered    ondansetron (ZOFRAN) 4 MG tablet  Every 8 hours PRN        10/01/22 2046    dicyclomine (BENTYL) 20 MG tablet  3 times daily before meals        10/01/22 2046              Suzzette Righter, PA-C 10/01/22 2112    Deno Etienne, DO 10/01/22 2215

## 2022-10-01 NOTE — ED Notes (Signed)
Called for triage. NO response

## 2022-10-01 NOTE — ED Triage Notes (Signed)
Pt reports RUQ muscle spasms for years that worsened today. Also having nausea and diarrhea since last week.

## 2022-10-15 ENCOUNTER — Ambulatory Visit (HOSPITAL_BASED_OUTPATIENT_CLINIC_OR_DEPARTMENT_OTHER): Admission: RE | Admit: 2022-10-15 | Payer: BLUE CROSS/BLUE SHIELD | Source: Ambulatory Visit

## 2022-11-30 ENCOUNTER — Emergency Department (HOSPITAL_COMMUNITY)
Admission: EM | Admit: 2022-11-30 | Discharge: 2022-11-30 | Disposition: A | Discharge status: Home / Self Care | Payer: Medicaid Other | Attending: Student | Admitting: Student

## 2022-11-30 ENCOUNTER — Other Ambulatory Visit: Payer: Self-pay

## 2022-11-30 ENCOUNTER — Encounter (HOSPITAL_COMMUNITY): Payer: Self-pay

## 2022-11-30 DIAGNOSIS — I10 Essential (primary) hypertension: Secondary | ICD-10-CM | POA: Diagnosis not present

## 2022-11-30 DIAGNOSIS — Z7951 Long term (current) use of inhaled steroids: Secondary | ICD-10-CM | POA: Insufficient documentation

## 2022-11-30 DIAGNOSIS — Z79899 Other long term (current) drug therapy: Secondary | ICD-10-CM | POA: Insufficient documentation

## 2022-11-30 DIAGNOSIS — Z87891 Personal history of nicotine dependence: Secondary | ICD-10-CM | POA: Diagnosis not present

## 2022-11-30 DIAGNOSIS — J449 Chronic obstructive pulmonary disease, unspecified: Secondary | ICD-10-CM | POA: Diagnosis not present

## 2022-11-30 DIAGNOSIS — R1011 Right upper quadrant pain: Secondary | ICD-10-CM | POA: Diagnosis present

## 2022-11-30 LAB — LIPASE, BLOOD: Lipase: 30 U/L (ref 11–51)

## 2022-11-30 LAB — CBC
HCT: 43.3 % (ref 36.0–46.0)
Hemoglobin: 14.2 g/dL (ref 12.0–15.0)
MCH: 30.8 pg (ref 26.0–34.0)
MCHC: 32.8 g/dL (ref 30.0–36.0)
MCV: 93.9 fL (ref 80.0–100.0)
Platelets: 359 10*3/uL (ref 150–400)
RBC: 4.61 MIL/uL (ref 3.87–5.11)
RDW: 12.6 % (ref 11.5–15.5)
WBC: 7.7 10*3/uL (ref 4.0–10.5)
nRBC: 0 % (ref 0.0–0.2)

## 2022-11-30 LAB — COMPREHENSIVE METABOLIC PANEL
ALT: 36 U/L (ref 0–44)
AST: 36 U/L (ref 15–41)
Albumin: 4.4 g/dL (ref 3.5–5.0)
Alkaline Phosphatase: 116 U/L (ref 38–126)
Anion gap: 11 (ref 5–15)
BUN: 19 mg/dL (ref 8–23)
CO2: 26 mmol/L (ref 22–32)
Calcium: 9 mg/dL (ref 8.9–10.3)
Chloride: 103 mmol/L (ref 98–111)
Creatinine, Ser: 1.18 mg/dL — ABNORMAL HIGH (ref 0.44–1.00)
GFR, Estimated: 52 mL/min — ABNORMAL LOW (ref >60)
Glucose, Bld: 104 mg/dL — ABNORMAL HIGH (ref 70–99)
Potassium: 3.6 mmol/L (ref 3.5–5.1)
Sodium: 140 mmol/L (ref 135–145)
Total Bilirubin: 0.8 mg/dL (ref 0.3–1.2)
Total Protein: 7.5 g/dL (ref 6.5–8.1)

## 2022-11-30 MED ORDER — LIDOCAINE 5 % EX PTCH
1.0000 | MEDICATED_PATCH | CUTANEOUS | Status: DC
  Administered 2022-11-30: 1 via TRANSDERMAL
  Filled 2022-11-30: qty 1

## 2022-11-30 MED ORDER — LIDOCAINE 5 % EX PTCH: 1.0000 | MEDICATED_PATCH | CUTANEOUS | 0 refills | Status: DC

## 2022-11-30 MED ORDER — DICYCLOMINE HCL 20 MG PO TABS: 20.0000 mg | ORAL_TABLET | Freq: Three times a day (TID) | ORAL | 0 refills | Status: DC

## 2022-11-30 MED ORDER — POLYETHYLENE GLYCOL 3350 17 G PO PACK: 17.0000 g | PACK | Freq: Every day | ORAL | 0 refills | Status: DC

## 2022-11-30 NOTE — ED Triage Notes (Signed)
Pt presents with RUQ abd pain that is intermittent x 3 months. Pt states when the pain is present she feels a knot. Pt has associated nausea. Pt states certain movements provoke the pain. Pt reports that taking a muscle relaxer helps relieve the pain.

## 2022-11-30 NOTE — ED Provider Triage Note (Signed)
Emergency Medicine Provider Triage Evaluation Note  Mariah Foster , a 62 y.o. female  was evaluated in triage.  Pt complains of RUQ pain for months. She states she feels a knot when the pain is worse. The pain occasionally wakes her from sleep and the pain has worsened.  Review of Systems  Positive: Right upper quadrant pain Negative: Vomiting  Physical Exam  BP (!) 191/112 (BP Location: Right Arm)   Pulse 80   Temp 98.2 F (36.8 C)   Resp (!) 22   Ht '5\' 4"'$  (1.626 m)   Wt 72.6 kg   SpO2 97%   BMI 27.46 kg/m  Gen:   Awake, no distress   Resp:  Normal effort  MSK:   Moves extremities without difficulty  Other:    Medical Decision Making  Medically screening exam initiated at 5:55 PM.  Appropriate orders placed.  Mariah Foster was informed that the remainder of the evaluation will be completed by another provider, this initial triage assessment does not replace that evaluation, and the importance of remaining in the ED until their evaluation is complete.     Gwenevere Abbot, [place] 11/30/22 1758

## 2022-11-30 NOTE — ED Notes (Signed)
Called for triage, no answer  x1 °

## 2022-11-30 NOTE — ED Provider Notes (Signed)
[facility] Provider Note  CSN: [id number] Arrival date & time: 11/30/22 1732  Chief Complaint(s) Abdominal Pain  HPI Mariah Foster is a 62 y.o. female with PMH COPD, HTN, HLD, GI bleed, constipation, iron deficiency anemia on iron who presents emergency department for evaluation of right upper quadrant abdominal pain.  Patient has been seen twice already in the emergency department with negative CT and ultrasound imaging for very similar complaint.  She states that she has right upper quadrant pain that is worse with palpation and certain twisting motions that comes on intermittently throughout the day.  She states that she is unsure if this is truly postprandial but states that she notices a big bulge in the right upper quadrant that is ultimately relieved after a bowel movement.  She denies nausea, vomiting, chest pain, shortness of breath or other systemic symptoms.   Past Medical History Past Medical History:  Diagnosis Date   COPD (chronic obstructive pulmonary disease) ([place])    GI bleed    Hyperlipidemia    Hypertension    Tubular adenoma of colon 2013   Patient Active Problem List   Diagnosis Date Noted   HTN (hypertension) 12/29/2012   Home Medication(s) Prior to Admission medications   Medication Sig Start Date End Date Taking? Authorizing Provider  lidocaine (LIDODERM) 5 % Place 1 patch onto the skin daily. Remove & Discard patch within 12 hours or as directed by MD 11/30/22  Yes Mills Mitton, MD  polyethylene glycol (MIRALAX) 17 g packet Take 17 g by mouth daily. 11/30/22  Yes Tommy Minichiello, MD  acetaminophen (TYLENOL) 500 MG tablet Take 1 tablet (500 mg total) by mouth every 6 (six) hours as needed. Patient not taking: Reported on 12/15/2021 07/01/19   Loura Halt A, NP  albuterol (PROVENTIL HFA;VENTOLIN HFA) 108 (90 Base) MCG/ACT inhaler Inhale 1-2 puffs into the lungs every 6 (six) hours as needed for wheezing or shortness of  breath.    [provider]  albuterol (PROVENTIL) (2.5 MG/3ML) 0.083% nebulizer solution Take 3 mLs (2.5 mg total) by nebulization 3 (three) times daily as needed for wheezing or shortness of breath. 09/10/22   Sherwood Gambler, MD  amLODipine (NORVASC) 5 MG tablet Take 1 tablet (5 mg total) by mouth daily. 09/10/22   Sherwood Gambler, MD  amoxicillin-clavulanate (AUGMENTIN) 875-125 MG tablet Take 1 tablet by mouth every 12 (twelve) hours. Patient taking differently: Take 1 tablet by mouth See admin instructions. Bid x 7 days 05/08/20   Eustaquio Maize, PA-C  atorvastatin (LIPITOR) 10 MG tablet Take 10 mg by mouth daily. 11/29/21   [provider]  baclofen (LIORESAL) 10 MG tablet Take 10 mg by mouth 2 (two) times daily as needed for muscle spasms. 09/15/21   [provider]  cyclobenzaprine (FLEXERIL) 5 MG tablet Take 1 tablet (5 mg total) by mouth 2 (two) times daily as needed for muscle spasms. Patient not taking: Reported on 12/15/2021 01/14/21   Scot Jun, NP  dicyclomine (BENTYL) 20 MG tablet Take 1 tablet (20 mg total) by mouth 3 (three) times daily before meals. 11/30/22   Vicki Chaffin, MD  DULoxetine (CYMBALTA) 30 MG capsule Take 30 mg by mouth 2 (two) times daily. 08/29/21   [provider]  Ferrous Sulfate (IRON) 325 (65 Fe) MG TABS Take 1 tablet (325 mg total) by mouth 2 (two) times daily. Patient taking differently: Take 325 mg by mouth daily. 10/15/17   Ladene Artist, MD  fluticasone (FLONASE) 50 MCG/ACT nasal spray Place 2 sprays into both nostrils daily. Patient not taking: Reported on 12/15/2021 11/06/17   Couture, Cortni S, PA-C  irbesartan (AVAPRO) 150 MG tablet Take 1 tablet (150 mg total) by mouth daily. 09/10/22   Sherwood Gambler, MD  levothyroxine (SYNTHROID) 75 MCG tablet Take 75 mcg by mouth daily. 08/29/21   [provider]  omeprazole (PRILOSEC) 40 MG capsule TAKE 1 CAPSULE BY MOUTH ONCE DAILY Patient not taking: Reported on  12/15/2021 09/11/18   Levin Erp, PA  potassium chloride SA (KLOR-CON M) 20 MEQ tablet Take 1 tablet (20 mEq total) by mouth daily. 09/10/22   Sherwood Gambler, MD  traMADol (ULTRAM) 50 MG tablet Take 50 mg by mouth every 6 (six) hours as needed. 11/29/21   [provider]  traZODone (DESYREL) 50 MG tablet Take 50-100 mg by mouth at bedtime as needed for sleep. 09/27/21   [provider]  potassium chloride (K-DUR) 10 MEQ tablet Take 1 tablet (10 mEq total) by mouth daily. 03/14/17 05/26/19  Volanda Napoleon, PA-C                                                                                                                                    Past Surgical History Past Surgical History:  Procedure Laterality Date   CESAREAN SECTION  [phone]   x 2   Family History Family History  Problem Relation Age of Onset   Heart attack Father    Diabetes Sister        x 2   Diabetes Brother     Social History Social History   Tobacco Use   Smoking status: Former    Packs/day: 0.20    Types: Cigarettes   Smokeless tobacco: Never  Vaping Use   Vaping Use: Never used  Substance Use Topics   Alcohol use: Yes    Comment: socially   Drug use: No   Allergies Flagyl [metronidazole] and Metronidazole hcl  Review of Systems Review of Systems  Gastrointestinal:  Positive for abdominal distention and abdominal pain.    Physical Exam Vital Signs  I have reviewed the triage vital signs BP (!) 177/100 (BP Location: Right Arm)   Pulse 88   Temp 97.6 F (36.4 C) (Oral)   Resp 18   Ht '5\' 4"'$  (1.626 m)   Wt 72.6 kg   SpO2 96%   BMI 27.46 kg/m   Physical Exam Vitals and nursing note reviewed.  Constitutional:      General: She is not in acute distress.    Appearance: She is well-developed.  HENT:     Head: Normocephalic and atraumatic.  Eyes:     Conjunctiva/sclera: Conjunctivae normal.  Cardiovascular:     Rate and Rhythm: Normal rate and regular rhythm.      Heart sounds: No murmur heard. Pulmonary:     Effort: Pulmonary effort is normal. No respiratory  distress.     Breath sounds: Normal breath sounds.  Abdominal:     Palpations: Abdomen is soft.     Tenderness: There is abdominal tenderness in the right upper quadrant.  Musculoskeletal:        General: No swelling.     Cervical back: Neck supple.  Skin:    General: Skin is warm and dry.     Capillary Refill: Capillary refill takes less than 2 seconds.  Neurological:     Mental Status: She is alert.  Psychiatric:        Mood and Affect: Mood normal.     ED Results and Treatments Labs (all labs ordered are listed, but only abnormal results are displayed) Labs Reviewed  COMPREHENSIVE METABOLIC PANEL - Abnormal; Notable for the following components:      Result Value   Glucose, Bld 104 (*)    Creatinine, Ser 1.18 (*)    GFR, Estimated 52 (*)    All other components within normal limits  LIPASE, BLOOD  CBC  URINALYSIS, ROUTINE W REFLEX MICROSCOPIC                                                                                                                          Radiology No results found.  Pertinent labs & imaging results that were available during my care of the patient were reviewed by me and considered in my medical decision making (see MDM for details).  Medications Ordered in ED Medications  lidocaine (LIDODERM) 5 % 1 patch (1 patch Transdermal Patch Applied 11/30/22 1943)                                                                                                                                     Procedures Procedures  (including critical care time)  Medical Decision Making / ED Course   This patient presents to the ED for concern of right upper quadrant abdominal pain, this involves an extensive number of treatment options, and is a complaint that carries with it a high risk of complications and morbidity.  The differential diagnosis includes  musculoskeletal pain, constipation, cholecystitis, cholelithiasis/biliary colic, choledocholithiasis, ascending cholangitis, acute hepatitis, pancreatitis, GERD, pneumonia, constipation, nephrolithiasis  MDM: Patient seen in the emergency department for evaluation of right upper quadrant abdominal pain.  Physical exam with tenderness in the right upper quadrant that is exacerbated with the patient standing and twisting.  Laboratory evaluation unremarkable with no leukocytosis, normal LFTs, normal bilirubin.  Lipase unremarkable.  As patient is already had 2 extensive evaluations for this, I had a long discussion with the patient about the pretest probability of finding abnormal findings on another CT scan and we have shared decision making to trial topical therapy with a Lidoderm patch and evaluate for improvement.  On reevaluation she did have significant proved meant with Lidoderm patch placement.  Given that the patient's symptoms appear to resolve after defecation, suspect the bulge that she is feeling may be secondary to intestinal gas and constipation and I sent a MiraLAX regimen to the patient's pharmacy.  She requested her Bentyl be refilled which I sent and I also sent Lidoderm patches to her pharmacy.  Additionally I sent a referral to outpatient general surgery for evaluation of possible biliary colic but at this time she does not meet inpatient criteria for admission she is safe for discharge with outpatient follow-up.   Additional history obtained:  -External records from outside source obtained and reviewed including: Chart review including previous notes, labs, imaging, consultation notes   Lab Tests: -I ordered, reviewed, and interpreted labs.   The pertinent results include:   Labs Reviewed  COMPREHENSIVE METABOLIC PANEL - Abnormal; Notable for the following components:      Result Value   Glucose, Bld 104 (*)    Creatinine, Ser 1.18 (*)    GFR, Estimated 52 (*)    All other  components within normal limits  LIPASE, BLOOD  CBC  URINALYSIS, ROUTINE W REFLEX MICROSCOPIC      Medicines ordered and prescription drug management: Meds ordered this encounter  Medications   lidocaine (LIDODERM) 5 % 1 patch   lidocaine (LIDODERM) 5 %    Sig: Place 1 patch onto the skin daily. Remove & Discard patch within 12 hours or as directed by MD    Dispense:  30 patch    Refill:  0   dicyclomine (BENTYL) 20 MG tablet    Sig: Take 1 tablet (20 mg total) by mouth 3 (three) times daily before meals.    Dispense:  20 tablet    Refill:  0   polyethylene glycol (MIRALAX) 17 g packet    Sig: Take 17 g by mouth daily.    Dispense:  14 each    Refill:  0    -I have reviewed the patients home medicines and have made adjustments as needed  Critical interventions none    Cardiac Monitoring: The patient was maintained on a cardiac monitor.  I personally viewed and interpreted the cardiac monitored which showed an underlying rhythm of: NSR  Social Determinants of Health:  Factors impacting patients care include: none   Reevaluation: After the interventions noted above, I reevaluated the patient and found that they have :improved  Co morbidities that complicate the patient evaluation  Past Medical History:  Diagnosis Date   COPD (chronic obstructive pulmonary disease) ([place])    GI bleed    Hyperlipidemia    Hypertension    Tubular adenoma of colon 2013      Dispostion: I considered admission for this patient, but at this time she does not meet inpatient criteria for admission she is safe for discharge and outpatient follow-up     Final Clinical Impression(s) / ED Diagnoses Final diagnoses:  RUQ abdominal pain     '@PCDICTATION'$ @    Wallis Vancott, Debe Coder, MD 11/30/22 [phone]

## 2022-12-03 ENCOUNTER — Other Ambulatory Visit: Payer: Self-pay | Admitting: Physician Assistant

## 2022-12-03 DIAGNOSIS — Z87891 Personal history of nicotine dependence: Secondary | ICD-10-CM

## 2022-12-29 NOTE — Progress Notes (Signed)
Cardiology Office Note:    Date:  01/01/2023   ID:  Mariah Foster, DOB May 12, 1961, MRN [id number]  PCP:  Woodfin Ganja, MD   [facility] Providers Cardiologist:  None   Referring MD: Iona Hansen, NP    History of Present Illness:    Mariah Foster is a 62 y.o. female with a hx of COPD, history of GIB, HTN and HLD who was referred by Mariah Lan, NP for further evaluation of HTN.  Today, the patient states that she overall feels okay. She has chronic SOB which has been related to her COPD. She also reports episodes of sharp pain that is not exertional in nature. Usually occurs when she is sitting or laying down in bed. Each episode lasts a couple of seconds to a minute and then improves. Otherwise, no LE edema, orthopnea or PND.  Quit smoking about 2 years; previously smoked about 1ppd ~45 years.   Blood pressure not monitored at home.   Father with history of MI  Past Medical History:  Diagnosis Date   COPD (chronic obstructive pulmonary disease) ([facility])    GI bleed    Hyperlipidemia    Hypertension    Tubular adenoma of colon 2013    Past Surgical History:  Procedure Laterality Date   CESAREAN SECTION  4098,[id number]   x 2    Current Medications: Current Meds  Medication Sig   acetaminophen (TYLENOL) 500 MG tablet Take 1 tablet (500 mg total) by mouth every 6 (six) hours as needed.   albuterol (PROVENTIL HFA;VENTOLIN HFA) 108 (90 Base) MCG/ACT inhaler Inhale 1-2 puffs into the lungs every 6 (six) hours as needed for wheezing or shortness of breath.   albuterol (PROVENTIL) (2.5 MG/3ML) 0.083% nebulizer solution Take 3 mLs (2.5 mg total) by nebulization 3 (three) times daily as needed for wheezing or shortness of breath.   amLODipine (NORVASC) 5 MG tablet Take 1 tablet (5 mg total) by mouth daily.   amoxicillin-clavulanate (AUGMENTIN) 875-125 MG tablet Take 1 tablet by mouth every 12 (twelve) hours. (Patient taking differently: Take 1 tablet by mouth See admin  instructions. Bid x 7 days)   atorvastatin (LIPITOR) 20 MG tablet Take 1 tablet (20 mg total) by mouth daily.   baclofen (LIORESAL) 10 MG tablet Take 10 mg by mouth 2 (two) times daily as needed for muscle spasms.   cyclobenzaprine (FLEXERIL) 5 MG tablet Take 1 tablet (5 mg total) by mouth 2 (two) times daily as needed for muscle spasms.   dicyclomine (BENTYL) 20 MG tablet Take 1 tablet (20 mg total) by mouth 3 (three) times daily before meals.   DULoxetine (CYMBALTA) 30 MG capsule Take 30 mg by mouth 2 (two) times daily.   Ferrous Sulfate (IRON) 325 (65 Fe) MG TABS Take 1 tablet (325 mg total) by mouth 2 (two) times daily. (Patient taking differently: Take 325 mg by mouth daily.)   fluticasone (FLONASE) 50 MCG/ACT nasal spray Place 2 sprays into both nostrils daily.   irbesartan (AVAPRO) 300 MG tablet Take 1 tablet (300 mg total) by mouth daily.   levothyroxine (SYNTHROID) 75 MCG tablet Take 75 mcg by mouth daily.   lidocaine (LIDODERM) 5 % Place 1 patch onto the skin daily. Remove & Discard patch within 12 hours or as directed by MD   omeprazole (PRILOSEC) 40 MG capsule TAKE 1 CAPSULE BY MOUTH ONCE DAILY   polyethylene glycol (MIRALAX) 17 g packet Take 17 g by mouth daily.   potassium chloride SA (  KLOR-CON M) 20 MEQ tablet Take 1 tablet (20 mEq total) by mouth daily.   traMADol (ULTRAM) 50 MG tablet Take 50 mg by mouth every 6 (six) hours as needed.   traZODone (DESYREL) 50 MG tablet Take 50-100 mg by mouth at bedtime as needed for sleep.   [DISCONTINUED] atorvastatin (LIPITOR) 10 MG tablet Take 10 mg by mouth daily.   [DISCONTINUED] irbesartan (AVAPRO) 150 MG tablet Take 1 tablet (150 mg total) by mouth daily.   Current Facility-Administered Medications for the 01/01/23 encounter (Office Visit) with Meriam Sprague, MD  Medication   0.9 %  sodium chloride infusion     Allergies:   Flagyl [metronidazole] and Metronidazole hcl   Social History   Socioeconomic History   Marital  status: Single    Spouse name: Not on file   Number of children: 2   Years of education: Not on file   Highest education level: Not on file  Occupational History   Occupation: cleaning service  Tobacco Use   Smoking status: Former    Packs/day: 0.20    Types: Cigarettes   Smokeless tobacco: Never  Vaping Use   Vaping Use: Never used  Substance and Sexual Activity   Alcohol use: Yes    Comment: socially   Drug use: No   Sexual activity: Never  Other Topics Concern   Not on file  Social History Narrative   Not on file   Social Determinants of Health   Financial Resource Strain: Not on file  Food Insecurity: Not on file  Transportation Needs: Not on file  Physical Activity: Not on file  Stress: Not on file  Social Connections: Not on file     Family History: The patient's family history includes Diabetes in her brother and sister; Heart attack in her father.  ROS:   Please see the history of present illness.     All other systems reviewed and are negative.  EKGs/Labs/Other Studies Reviewed:    The following studies were reviewed today: CTA Chest 11/2021: FINDINGS: Cardiovascular: Good contrast bolus timing in the pulmonary arterial tree. Mild enlargement of the central pulmonary arteries (series 7, image 188). Normal bilateral pulmonary artery enhancement.   No focal filling defect identified in the pulmonary arteries to suggest acute pulmonary embolism.   Calcified coronary artery atherosclerosis (series 7, image 228). No cardiomegaly or pericardial effusion. Negative visible aorta aside from atherosclerosis.   Mediastinum/Nodes: Negative. No mediastinal mass or lymphadenopathy.   Lungs/Pleura: Moderate to severe upper lobe centrilobular emphysema. Less pronounced middle and lower lobe emphysema. Large lung volumes. There is mild dependent retained secretions throughout the trachea, but the major airways remain patent. There is widespread bronchial wall  thickening. No pleural effusion or evidence of acute pulmonary inflammation. No pulmonary nodule.   Upper Abdomen: Visible liver is stable since 2016, with small benign cyst or hemangioma of the central liver dome. Negative visible spleen, pancreas, adrenal glands, kidneys and bowel.   Musculoskeletal: Negative.   Review of the MIP images confirms the above findings.   IMPRESSION: 1. Negative for acute pulmonary embolus. Enlargement of the central pulmonary artery raising the possibility of pulmonary artery hypertension.   2. Emphysema (ICD10-J43.9), severe in the upper lobes. Generalized bronchial wall thickening. Some retained secretions in the trachea.   3. Calcified coronary artery and Aortic Atherosclerosis  EKG:  EKG is not ordered today.  The ekg 09/2022: NSR; no acute ischemic changes  Recent Labs: 11/30/2022: ALT 36; BUN 19; Creatinine, Ser 1.18;  Hemoglobin 14.2; Platelets 359; Potassium 3.6; Sodium 140  Recent Lipid Panel No results found for: "CHOL", "TRIG", "HDL", "CHOLHDL", "VLDL", "LDLCALC", "LDLDIRECT"   Risk Assessment/Calculations:           Physical Exam:    VS:  BP (!) 145/96   Pulse 86   Ht 5\' 4"  (1.626 m)   Wt 135 lb 3.2 oz (61.3 kg)   SpO2 97%   BMI 23.21 kg/m     Wt Readings from Last 3 Encounters:  01/01/23 135 lb 3.2 oz (61.3 kg)  11/30/22 160 lb (72.6 kg)  10/01/22 145 lb 8.1 oz (66 kg)     GEN:  Well nourished, well developed in no acute distress HEENT: Normal NECK: No JVD; No carotid bruits CARDIAC: RRR, no murmurs, rubs, gallops RESPIRATORY:  Diminished but clear ABDOMEN: Soft, non-tender, non-distended MUSCULOSKELETAL:  No edema; No deformity  SKIN: Warm and dry NEUROLOGIC:  Alert and oriented x 3 PSYCHIATRIC:  Normal affect   ASSESSMENT:    1. Primary hypertension   2. Chronic obstructive pulmonary disease, unspecified COPD type (HCC)   3. Medication management   4. Hyperlipidemia, unspecified hyperlipidemia type   5.  Chest pain of uncertain etiology   6. Atypical chest pain   7. Coronary artery calcification    PLAN:    In order of problems listed above:  #Atypical Chest Pain: #Coronary Artery Ca: Noted on CTA chest. Currently, no exertional symptoms but has some episodes of atypical chest pain. Will check TTE for further evaluation. Cannot afford Ca score at this time -Check TTE -Increase lipitor to 20mg  daily  #HTN: Elevated to 140s today.  -Continue amlodipine 5mg  daily -Increase irbesartan 300mg  daily (cough started before irbesartan) -Check BMET next week  #HLD: -Increase lipitor to 20mg  daily -Cannot afford Ca score at this time -Check lipids in 8 weeks  #COPD: ~45 pack year smoking history and quit 2 years ago. Wishes to establish with a Pulmonologist. -Continue home inhalers -Refer to Pulmonologist per patient preference           Medication Adjustments/Labs and Tests Ordered: Current medicines are reviewed at length with the patient today.  Concerns regarding medicines are outlined above.  Orders Placed This Encounter  Procedures   Basic metabolic panel   Lipid Profile   Ambulatory referral to Pulmonology   ECHOCARDIOGRAM COMPLETE   Meds ordered this encounter  Medications   irbesartan (AVAPRO) 300 MG tablet    Sig: Take 1 tablet (300 mg total) by mouth daily.    Dispense:  90 tablet    Refill:  2    Dose increased   atorvastatin (LIPITOR) 20 MG tablet    Sig: Take 1 tablet (20 mg total) by mouth daily.    Dispense:  90 tablet    Refill:  2    Dose increase    Patient Instructions  Medication Instructions:   INCREASE YOUR IRBESARTAN TO 300 MG BY MOUTH DAILY  INCREASE YOUR LIPITOR TO 20 MG BY MOUTH DAILY  *If you need a refill on your cardiac medications before your next appointment, please call your pharmacy*   You have been referred to [facility] PULMONOLOGY FOR MANAGEMENT OF YOUR COPD    Lab Work:  1.)  ONE WEEK HERE IN THE OFFICE--BMET  2.)  IN  8 WEEKS HERE IN THE OFFICE--LIPIDS--PLEASE COME FASTING TO THIS LAB APPOINTMENT  If you have labs (blood work) drawn today and your tests are completely normal, you will receive your results only  by: MyChart Message (if you have MyChart) OR A paper copy in the mail If you have any lab test that is abnormal or we need to change your treatment, we will call you to review the results.    Testing/Procedures:  Your physician has requested that you have an echocardiogram. Echocardiography is a painless test that uses sound waves to create images of your heart. It provides your doctor with information about the size and shape of your heart and how well your heart's chambers and valves are working. This procedure takes approximately one hour. There are no restrictions for this procedure. Please do NOT wear cologne, perfume, aftershave, or lotions (deodorant is allowed). Please arrive 15 minutes prior to your appointment time.    Follow-Up: At [facility], you and your health needs are our priority.  As part of our continuing mission to provide you with exceptional heart care, we have created designated Provider Care Teams.  These Care Teams include your primary Cardiologist (physician) and Advanced Practice Providers (APPs -  Physician Assistants and Nurse Practitioners) who all work together to provide you with the care you need, when you need it.  We recommend signing up for the patient portal called "MyChart".  Sign up information is provided on this After Visit Summary.  MyChart is used to connect with patients for Virtual Visits (Telemedicine).  Patients are able to view lab/test results, encounter notes, upcoming appointments, etc.  Non-urgent messages can be sent to your provider as well.   To learn more about what you can do with MyChart, go to [email or web address].    Your next appointment:   6 month(s)  Provider:   DR. Shari Prows     Signed, Meriam Sprague, MD   01/01/2023 3:26 PM    [facility]

## 2023-01-01 ENCOUNTER — Other Ambulatory Visit: Payer: Self-pay | Admitting: Physician Assistant

## 2023-01-01 ENCOUNTER — Encounter: Payer: Self-pay | Admitting: Cardiology

## 2023-01-01 ENCOUNTER — Ambulatory Visit: Payer: Medicaid Other | Attending: Cardiology | Admitting: Cardiology

## 2023-01-01 VITALS — BP 145/96 | HR 86 | Ht 64.0 in | Wt 135.2 lb

## 2023-01-01 DIAGNOSIS — J449 Chronic obstructive pulmonary disease, unspecified: Secondary | ICD-10-CM

## 2023-01-01 DIAGNOSIS — R748 Abnormal levels of other serum enzymes: Secondary | ICD-10-CM

## 2023-01-01 DIAGNOSIS — I10 Essential (primary) hypertension: Secondary | ICD-10-CM

## 2023-01-01 DIAGNOSIS — Z79899 Other long term (current) drug therapy: Secondary | ICD-10-CM

## 2023-01-01 DIAGNOSIS — I2510 Atherosclerotic heart disease of native coronary artery without angina pectoris: Secondary | ICD-10-CM

## 2023-01-01 DIAGNOSIS — R1011 Right upper quadrant pain: Secondary | ICD-10-CM

## 2023-01-01 DIAGNOSIS — I2584 Coronary atherosclerosis due to calcified coronary lesion: Secondary | ICD-10-CM

## 2023-01-01 DIAGNOSIS — R0789 Other chest pain: Secondary | ICD-10-CM

## 2023-01-01 DIAGNOSIS — E785 Hyperlipidemia, unspecified: Secondary | ICD-10-CM

## 2023-01-01 DIAGNOSIS — R079 Chest pain, unspecified: Secondary | ICD-10-CM

## 2023-01-01 MED ORDER — ATORVASTATIN CALCIUM 20 MG PO TABS: 20.0000 mg | ORAL_TABLET | Freq: Every day | ORAL | 2 refills | Status: DC

## 2023-01-01 MED ORDER — IRBESARTAN 300 MG PO TABS: 300.0000 mg | ORAL_TABLET | Freq: Every day | ORAL | 2 refills | Status: DC

## 2023-01-01 NOTE — Patient Instructions (Signed)
Medication Instructions:   INCREASE YOUR IRBESARTAN TO 300 MG BY MOUTH DAILY  INCREASE YOUR LIPITOR TO 20 MG BY MOUTH DAILY  *If you need a refill on your cardiac medications before your next appointment, please call your pharmacy*   You have been referred to [place]    Lab Work:  1.)  [place] OFFICE--BMET  2.)  IN 8 WEEKS HERE IN THE OFFICE--LIPIDS--PLEASE COME FASTING TO THIS LAB APPOINTMENT  If you have labs (blood work) drawn today and your tests are completely normal, you will receive your results only by: [place] (if you have MyChart) OR A paper copy in the mail If you have any lab test that is abnormal or we need to change your treatment, we will call you to review the results.    Testing/Procedures:  Your physician has requested that you have an echocardiogram. Echocardiography is a painless test that uses sound waves to create images of your heart. It provides your doctor with information about the size and shape of your heart and how well your heart's chambers and valves are working. This procedure takes approximately one hour. There are no restrictions for this procedure. Please do NOT wear cologne, perfume, aftershave, or lotions (deodorant is allowed). Please arrive 15 minutes prior to your appointment time.    Follow-Up: At [facility], you and your health needs are our priority.  As part of our continuing mission to provide you with exceptional heart care, we have created designated Provider Care Teams.  These Care Teams include your primary Cardiologist (physician) and Advanced Practice Providers (APPs -  Physician Assistants and Nurse Practitioners) who all work together to provide you with the care you need, when you need it.  We recommend signing up for the patient portal called "MyChart".  Sign up information is provided on this After Visit Summary.  MyChart is used to connect with patients  for Virtual Visits (Telemedicine).  Patients are able to view lab/test results, encounter notes, upcoming appointments, etc.  Non-urgent messages can be sent to your provider as well.   To learn more about what you can do with MyChart, go to [email or web address].    Your next appointment:   6 month(s)  Provider:   DR. Johney Frame

## 2023-01-09 ENCOUNTER — Ambulatory Visit: Payer: Medicaid Other | Attending: Cardiology

## 2023-01-09 DIAGNOSIS — E785 Hyperlipidemia, unspecified: Secondary | ICD-10-CM

## 2023-01-09 DIAGNOSIS — Z79899 Other long term (current) drug therapy: Secondary | ICD-10-CM

## 2023-01-09 DIAGNOSIS — J449 Chronic obstructive pulmonary disease, unspecified: Secondary | ICD-10-CM

## 2023-01-09 DIAGNOSIS — R079 Chest pain, unspecified: Secondary | ICD-10-CM

## 2023-01-09 DIAGNOSIS — I10 Essential (primary) hypertension: Secondary | ICD-10-CM

## 2023-01-10 LAB — BASIC METABOLIC PANEL
BUN/Creatinine Ratio: 19 (ref 12–28)
BUN: 19 mg/dL (ref 8–27)
CO2: 25 mmol/L (ref 20–29)
Calcium: 9.5 mg/dL (ref 8.7–10.3)
Chloride: 102 mmol/L (ref 96–106)
Creatinine, Ser: 1.02 mg/dL — ABNORMAL HIGH (ref 0.57–1.00)
Glucose: 94 mg/dL (ref 70–99)
Potassium: 4.2 mmol/L (ref 3.5–5.2)
Sodium: 142 mmol/L (ref 134–144)
eGFR: 62 mL/min/{1.73_m2} (ref >59)

## 2023-01-28 ENCOUNTER — Other Ambulatory Visit: Payer: BLUE CROSS/BLUE SHIELD

## 2023-01-30 ENCOUNTER — Ambulatory Visit: Payer: Medicaid Other | Admitting: Pulmonary Disease

## 2023-01-30 ENCOUNTER — Telehealth: Payer: Self-pay | Admitting: Pulmonary Disease

## 2023-01-30 ENCOUNTER — Encounter: Payer: Self-pay | Admitting: Pulmonary Disease

## 2023-01-30 VITALS — BP 128/86 | HR 83 | Ht 64.0 in | Wt 133.8 lb

## 2023-01-30 DIAGNOSIS — J432 Centrilobular emphysema: Secondary | ICD-10-CM | POA: Diagnosis not present

## 2023-01-30 DIAGNOSIS — Z87891 Personal history of nicotine dependence: Secondary | ICD-10-CM

## 2023-01-30 MED ORDER — AZITHROMYCIN 250 MG PO TABS: ORAL_TABLET | ORAL | 0 refills | Status: DC

## 2023-01-30 MED ORDER — BREZTRI AEROSPHERE 160-9-4.8 MCG/ACT IN AERO: 2.0000 | INHALATION_SPRAY | Freq: Two times a day (BID) | RESPIRATORY_TRACT | 6 refills | Status: DC

## 2023-01-30 MED ORDER — PREDNISONE 10 MG PO TABS: 40.0000 mg | ORAL_TABLET | Freq: Every day | ORAL | 0 refills | Status: DC

## 2023-01-30 MED ORDER — BREZTRI AEROSPHERE 160-9-4.8 MCG/ACT IN AERO: 2.0000 | INHALATION_SPRAY | Freq: Two times a day (BID) | RESPIRATORY_TRACT | 0 refills | Status: DC

## 2023-01-30 NOTE — Progress Notes (Signed)
Synopsis: Referred in April 2024 for COPD by Gwyndolyn Kaufman, MD  Subjective:   PATIENT ID: Mariah Foster GENDER: female DOB: Feb 25, 1961, MRN: [id number]  HPI  Chief Complaint  Patient presents with   Consult    Referred by cardiology for COPD.    Mariah Foster is a 62 year old woman, former smoker with hypertension who is referred to pulmonary clinic for COPD.   She was seen in cardiology clinic 01/01/23, note reviewed. She has been ordered for an echocardiogram.   She has significant cough that bothers her all day and it wakes her up at night. She has wheezing and exertional shortness of breath. She has been using symbicort 160-4.42mcg as needed and as needed albuterol.   She has 45 pack year history. Quit smoking 1-2 years ago. She had second hand smoke in childhood from her father. No FH of lung disease. She worked in Water [employer], currently retired.    Past Medical History:  Diagnosis Date   COPD (chronic obstructive pulmonary disease)    GI bleed    Hyperlipidemia    Hypertension    Tubular adenoma of colon 2013     Family History  Problem Relation Age of Onset   Heart attack Father    Diabetes Sister        x 2   Diabetes Brother      Social History   Socioeconomic History   Marital status: Single    Spouse name: Not on file   Number of children: 2   Years of education: Not on file   Highest education level: Not on file  Occupational History   Occupation: cleaning service  Tobacco Use   Smoking status: Former    Packs/day: .2    Types: Cigarettes   Smokeless tobacco: Never  Vaping Use   Vaping Use: Never used  Substance and Sexual Activity   Alcohol use: Yes    Comment: socially   Drug use: No   Sexual activity: Never  Other Topics Concern   Not on file  Social History Narrative   Not on file   Social Determinants of Health   Financial Resource Strain: Not on file  Food Insecurity: Not on file  Transportation Needs: Not on file   Physical Activity: Not on file  Stress: Not on file  Social Connections: Not on file  Intimate Partner Violence: Not on file     Allergies  Allergen Reactions   Flagyl [Metronidazole]    Metronidazole Hcl Hives    Stomach cramps     Outpatient Medications Prior to Visit  Medication Sig Dispense Refill   acetaminophen (TYLENOL) 500 MG tablet Take 1 tablet (500 mg total) by mouth every 6 (six) hours as needed. 30 tablet 0   albuterol (PROVENTIL HFA;VENTOLIN HFA) 108 (90 Base) MCG/ACT inhaler Inhale 1-2 puffs into the lungs every 6 (six) hours as needed for wheezing or shortness of breath.     albuterol (PROVENTIL) (2.5 MG/3ML) 0.083% nebulizer solution Take 3 mLs (2.5 mg total) by nebulization 3 (three) times daily as needed for wheezing or shortness of breath. 75 mL 0   amLODipine (NORVASC) 5 MG tablet Take 1 tablet (5 mg total) by mouth daily. 30 tablet 0   atorvastatin (LIPITOR) 20 MG tablet Take 1 tablet (20 mg total) by mouth daily. 90 tablet 2   baclofen (LIORESAL) 10 MG tablet Take 10 mg by mouth 2 (two) times daily as needed for muscle spasms.  cyclobenzaprine (FLEXERIL) 5 MG tablet Take 1 tablet (5 mg total) by mouth 2 (two) times daily as needed for muscle spasms. 20 tablet 0   dicyclomine (BENTYL) 20 MG tablet Take 1 tablet (20 mg total) by mouth 3 (three) times daily before meals. 20 tablet 0   DULoxetine (CYMBALTA) 30 MG capsule Take 30 mg by mouth 2 (two) times daily.     Ferrous Sulfate (IRON) 325 (65 Fe) MG TABS Take 1 tablet (325 mg total) by mouth 2 (two) times daily. (Patient taking differently: Take 325 mg by mouth daily.) 60 each 5   fluticasone (FLONASE) 50 MCG/ACT nasal spray Place 2 sprays into both nostrils daily. 16 g 0   irbesartan (AVAPRO) 300 MG tablet Take 1 tablet (300 mg total) by mouth daily. 90 tablet 2   levothyroxine (SYNTHROID) 75 MCG tablet Take 75 mcg by mouth daily.     lidocaine (LIDODERM) 5 % Place 1 patch onto the skin daily. Remove & Discard  patch within 12 hours or as directed by MD 30 patch 0   omeprazole (PRILOSEC) 40 MG capsule TAKE 1 CAPSULE BY MOUTH ONCE DAILY 60 capsule 0   polyethylene glycol (MIRALAX) 17 g packet Take 17 g by mouth daily. 14 each 0   potassium chloride SA (KLOR-CON M) 20 MEQ tablet Take 1 tablet (20 mEq total) by mouth daily. 3 tablet 0   traMADol (ULTRAM) 50 MG tablet Take 50 mg by mouth every 6 (six) hours as needed.     traZODone (DESYREL) 50 MG tablet Take 50-100 mg by mouth at bedtime as needed for sleep.     budesonide-formoterol (SYMBICORT) 160-4.5 MCG/ACT inhaler Inhale 2 puffs into the lungs 2 (two) times daily.     amoxicillin-clavulanate (AUGMENTIN) 875-125 MG tablet Take 1 tablet by mouth every 12 (twelve) hours. (Patient taking differently: Take 1 tablet by mouth See admin instructions. Bid x 7 days) 14 tablet 0   Facility-Administered Medications Prior to Visit  Medication Dose Route Frequency Provider Last Rate Last Admin   0.9 %  sodium chloride infusion  500 mL Intravenous Once Ladene Artist, MD        Review of Systems  Constitutional:  Negative for chills, fever, malaise/fatigue and weight loss.  HENT:  Positive for sore throat. Negative for congestion and sinus pain.   Eyes: Negative.   Respiratory:  Positive for cough, sputum production and shortness of breath. Negative for hemoptysis and wheezing.   Cardiovascular:  Negative for chest pain, palpitations, orthopnea, claudication and leg swelling.  Gastrointestinal:  Positive for heartburn. Negative for abdominal pain, nausea and vomiting.  Genitourinary: Negative.   Musculoskeletal:  Negative for joint pain and myalgias.  Skin:  Negative for rash.  Neurological:  Positive for headaches. Negative for weakness.  Endo/Heme/Allergies: Negative.   Psychiatric/Behavioral: Negative.     Objective:   Vitals:   01/30/23 1049  BP: 128/86  Pulse: 83  SpO2: 98%  Weight: 133 lb 12.8 oz (60.7 kg)  Height: 5\' 4"  (1.626 m)    Physical Exam Constitutional:      General: She is not in acute distress.    Appearance: She is not ill-appearing.  HENT:     Head: Normocephalic and atraumatic.  Eyes:     General: No scleral icterus.    Conjunctiva/sclera: Conjunctivae normal.     Pupils: Pupils are equal, round, and reactive to light.  Cardiovascular:     Rate and Rhythm: Normal rate and regular rhythm.  Pulses: Normal pulses.     Heart sounds: Normal heart sounds. No murmur heard. Pulmonary:     Effort: Pulmonary effort is normal.     Breath sounds: Decreased air movement present. Wheezing present. No rhonchi or rales.  Abdominal:     General: Bowel sounds are normal.     Palpations: Abdomen is soft.  Musculoskeletal:     Right lower leg: No edema.     Left lower leg: No edema.  Lymphadenopathy:     Cervical: No cervical adenopathy.  Skin:    General: Skin is warm and dry.  Neurological:     General: No focal deficit present.     Mental Status: She is alert.  Psychiatric:        Mood and Affect: Mood normal.        Behavior: Behavior normal.        Thought Content: Thought content normal.        Judgment: Judgment normal.    CBC    Component Value Date/Time   WBC 7.7 11/30/2022 1804   RBC 4.61 11/30/2022 1804   HGB 14.2 11/30/2022 1804   HCT 43.3 11/30/2022 1804   PLT 359 11/30/2022 1804   MCV 93.9 11/30/2022 1804   MCH 30.8 11/30/2022 1804   MCHC 32.8 11/30/2022 1804   RDW 12.6 11/30/2022 1804   LYMPHSABS 2.1 12/15/2021 0319   MONOABS 1.5 (H) 12/15/2021 0319   EOSABS 0.1 12/15/2021 0319   BASOSABS 0.0 12/15/2021 0319      Latest Ref Rng & Units 01/09/2023   12:44 PM 11/30/2022    6:04 PM 10/01/2022    4:11 PM  BMP  Glucose 70 - 99 mg/dL 94  104  106   BUN 8 - 27 mg/dL 19  19  21    Creatinine 0.57 - 1.00 mg/dL 1.02  1.18  1.30   BUN/Creat Ratio 12 - 28 19     Sodium 134 - 144 mmol/L 142  140  142   Potassium 3.5 - 5.2 mmol/L 4.2  3.6  3.8   Chloride 96 - 106 mmol/L 102  103   106   CO2 20 - 29 mmol/L 25  26  28    Calcium 8.7 - 10.3 mg/dL 9.5  9.0  9.3    Chest imaging: CTA Chest 12/15/21 Cardiovascular: Good contrast bolus timing in the pulmonary arterial tree. Mild enlargement of the central pulmonary arteries (series 7, image 188). Normal bilateral pulmonary artery enhancement.   No focal filling defect identified in the pulmonary arteries to suggest acute pulmonary embolism.   Calcified coronary artery atherosclerosis (series 7, image 228). No cardiomegaly or pericardial effusion. Negative visible aorta aside from atherosclerosis.   Mediastinum/Nodes: Negative. No mediastinal mass or lymphadenopathy.   Lungs/Pleura: Moderate to severe upper lobe centrilobular emphysema. Less pronounced middle and lower lobe emphysema. Large lung volumes. There is mild dependent retained secretions throughout the trachea, but the major airways remain patent. There is widespread bronchial wall thickening. No pleural effusion or evidence of acute pulmonary inflammation. No pulmonary nodule.  PFT:     No data to display          Labs:  Path:  Echo:  Heart Catheterization:    Assessment & Plan:   Centrilobular emphysema - Plan: predniSONE (DELTASONE) 10 MG tablet, azithromycin (ZITHROMAX) 250 MG tablet, Budeson-Glycopyrrol-Formoterol (BREZTRI AEROSPHERE) 160-9-4.8 MCG/ACT AERO, Pulmonary Function Test  Former cigarette smoker - Plan: Ambulatory Referral for Lung Cancer Scre  Discussion: Mariah Foster is a  62 year old woman, former smoker with hypertension who is referred to pulmonary clinic for COPD.   She has evidence on CTA Chest from last year. We will check PFTs at follow up visit.  We will refer her to our lung cancer screening team, 45 pack year history and quit 1-2 years ago.   She is to stop symbicort inhaler and start breztri 2 puffs twice daily. She can continue albuterol inhaler as needed.   We will treat her with 5 days of prednisone and  Zpak for wheezing on exam today.   Follow up in 2 months with PFTs.   Freda Jackson, MD [facility] Office: [phone]   Current Outpatient Medications:    acetaminophen (TYLENOL) 500 MG tablet, Take 1 tablet (500 mg total) by mouth every 6 (six) hours as needed., Disp: 30 tablet, Rfl: 0   albuterol (PROVENTIL HFA;VENTOLIN HFA) 108 (90 Base) MCG/ACT inhaler, Inhale 1-2 puffs into the lungs every 6 (six) hours as needed for wheezing or shortness of breath., Disp: , Rfl:    albuterol (PROVENTIL) (2.5 MG/3ML) 0.083% nebulizer solution, Take 3 mLs (2.5 mg total) by nebulization 3 (three) times daily as needed for wheezing or shortness of breath., Disp: 75 mL, Rfl: 0   amLODipine (NORVASC) 5 MG tablet, Take 1 tablet (5 mg total) by mouth daily., Disp: 30 tablet, Rfl: 0   atorvastatin (LIPITOR) 20 MG tablet, Take 1 tablet (20 mg total) by mouth daily., Disp: 90 tablet, Rfl: 2   azithromycin (ZITHROMAX) 250 MG tablet, Take as directed, Disp: 6 tablet, Rfl: 0   baclofen (LIORESAL) 10 MG tablet, Take 10 mg by mouth 2 (two) times daily as needed for muscle spasms., Disp: , Rfl:    Budeson-Glycopyrrol-Formoterol (BREZTRI AEROSPHERE) 160-9-4.8 MCG/ACT AERO, Inhale 2 puffs into the lungs in the morning and at bedtime., Disp: 10.7 g, Rfl: 6   cyclobenzaprine (FLEXERIL) 5 MG tablet, Take 1 tablet (5 mg total) by mouth 2 (two) times daily as needed for muscle spasms., Disp: 20 tablet, Rfl: 0   dicyclomine (BENTYL) 20 MG tablet, Take 1 tablet (20 mg total) by mouth 3 (three) times daily before meals., Disp: 20 tablet, Rfl: 0   DULoxetine (CYMBALTA) 30 MG capsule, Take 30 mg by mouth 2 (two) times daily., Disp: , Rfl:    Ferrous Sulfate (IRON) 325 (65 Fe) MG TABS, Take 1 tablet (325 mg total) by mouth 2 (two) times daily. (Patient taking differently: Take 325 mg by mouth daily.), Disp: 60 each, Rfl: 5   fluticasone (FLONASE) 50 MCG/ACT nasal spray, Place 2 sprays into both nostrils  daily., Disp: 16 g, Rfl: 0   irbesartan (AVAPRO) 300 MG tablet, Take 1 tablet (300 mg total) by mouth daily., Disp: 90 tablet, Rfl: 2   levothyroxine (SYNTHROID) 75 MCG tablet, Take 75 mcg by mouth daily., Disp: , Rfl:    lidocaine (LIDODERM) 5 %, Place 1 patch onto the skin daily. Remove & Discard patch within 12 hours or as directed by MD, Disp: 30 patch, Rfl: 0   omeprazole (PRILOSEC) 40 MG capsule, TAKE 1 CAPSULE BY MOUTH ONCE DAILY, Disp: 60 capsule, Rfl: 0   polyethylene glycol (MIRALAX) 17 g packet, Take 17 g by mouth daily., Disp: 14 each, Rfl: 0   potassium chloride SA (KLOR-CON M) 20 MEQ tablet, Take 1 tablet (20 mEq total) by mouth daily., Disp: 3 tablet, Rfl: 0   predniSONE (DELTASONE) 10 MG tablet, Take 4 tablets (40 mg total) by mouth  daily with breakfast., Disp: 20 tablet, Rfl: 0   traMADol (ULTRAM) 50 MG tablet, Take 50 mg by mouth every 6 (six) hours as needed., Disp: , Rfl:    traZODone (DESYREL) 50 MG tablet, Take 50-100 mg by mouth at bedtime as needed for sleep., Disp: , Rfl:   Current Facility-Administered Medications:    0.9 %  sodium chloride infusion, 500 mL, Intravenous, Once, Fuller Plan Pricilla Riffle, MD

## 2023-01-30 NOTE — Addendum Note (Signed)
Addended by: Valerie Salts on: 01/30/2023 11:35 AM   Modules accepted: Orders

## 2023-01-30 NOTE — Telephone Encounter (Signed)
Can we run a ticket on patients insurance to see what's covered. Insurance wont pay for [employer]  Please route message back to triage

## 2023-01-30 NOTE — Patient Instructions (Addendum)
Stop taking your Symbicort inhaler  Start taking breztri inhaler 2 puffs twice daily - rinse mouth out after each use  Continue to use albuterol inhaler 1-2 puffs every 4-6 hours as needed  Take prednisone 40mg  daily for 5 days Take Zpak antibiotic daily for 5 days  We will refer you to our lung cancer screening program for a CT chest scan  Follow up in 2 months with pulmonary function tests

## 2023-01-31 ENCOUNTER — Other Ambulatory Visit (HOSPITAL_COMMUNITY): Payer: Self-pay

## 2023-01-31 ENCOUNTER — Ambulatory Visit (HOSPITAL_COMMUNITY): Payer: Medicaid Other | Attending: Cardiology

## 2023-01-31 DIAGNOSIS — R079 Chest pain, unspecified: Secondary | ICD-10-CM

## 2023-01-31 DIAGNOSIS — I5030 Unspecified diastolic (congestive) heart failure: Secondary | ICD-10-CM | POA: Diagnosis not present

## 2023-01-31 DIAGNOSIS — I361 Nonrheumatic tricuspid (valve) insufficiency: Secondary | ICD-10-CM | POA: Diagnosis not present

## 2023-01-31 DIAGNOSIS — I10 Essential (primary) hypertension: Secondary | ICD-10-CM | POA: Diagnosis not present

## 2023-01-31 DIAGNOSIS — E785 Hyperlipidemia, unspecified: Secondary | ICD-10-CM

## 2023-01-31 DIAGNOSIS — I517 Cardiomegaly: Secondary | ICD-10-CM | POA: Diagnosis not present

## 2023-01-31 LAB — ECHOCARDIOGRAM COMPLETE
Area-P 1/2: 4.12 cm2
S' Lateral: 2.2 cm

## 2023-01-31 NOTE — Telephone Encounter (Signed)
Called and spoke with patient. She stated that to her knowledge, her insurance with [employer] is the only insurance coverage she has. I advised her that I would call [facility] to see what other coverage they had on file for her. She verbalized understanding. While on the phone, she stated that that Breztri samples were really working for her and she hopes that the insurance will cover it.   [facility] and spoke with the pharmacist. They stated that the patient has both [employer] and [employer] but they are not sure which one is primary as Medicaid Hospital doctor) has been the only one paying for her prescriptions.

## 2023-01-31 NOTE — Telephone Encounter (Signed)
Per benefits investigation patients [employer] is showing there is an additional primary coverage for the patient, we will need this benefit information before proceeding.

## 2023-02-01 NOTE — Telephone Encounter (Signed)
Based on this information the patient will have to submit the pharmacy insurance information from [facility] as this seems to be their new primary insurance with [employer] being secondary. If this is not correct then the patient will have to reach out to their insurance companies

## 2023-02-04 NOTE — Telephone Encounter (Signed)
Called patient but she did not answer. Left message for patient to call back.  

## 2023-02-14 NOTE — Telephone Encounter (Signed)
Called and spoke with pt about her insurances. Pt only has Medicaid with [employer]. Does not have [employer] at all!  This was updated when you look at pt's current insurances in her chart so it should only show the Medicaid.  Routing this back to prior auth pool so they can reinitiate PA with medicaid.

## 2023-02-15 ENCOUNTER — Other Ambulatory Visit (HOSPITAL_COMMUNITY): Payer: Self-pay

## 2023-02-15 NOTE — Telephone Encounter (Signed)
[employer] through Medicaid is still showing they are secondary. Patient must contact insurance company to resolve.

## 2023-02-20 ENCOUNTER — Ambulatory Visit
Admission: RE | Admit: 2023-02-20 | Discharge: 2023-02-20 | Disposition: A | Discharge status: Home / Self Care | Payer: Medicaid Other | Source: Ambulatory Visit | Attending: Physician Assistant | Admitting: Physician Assistant

## 2023-02-20 DIAGNOSIS — R748 Abnormal levels of other serum enzymes: Secondary | ICD-10-CM

## 2023-02-20 DIAGNOSIS — R1011 Right upper quadrant pain: Secondary | ICD-10-CM

## 2023-02-26 ENCOUNTER — Ambulatory Visit: Payer: Medicaid Other | Attending: Cardiology

## 2023-02-26 DIAGNOSIS — E785 Hyperlipidemia, unspecified: Secondary | ICD-10-CM

## 2023-02-26 DIAGNOSIS — Z79899 Other long term (current) drug therapy: Secondary | ICD-10-CM

## 2023-02-26 DIAGNOSIS — J449 Chronic obstructive pulmonary disease, unspecified: Secondary | ICD-10-CM

## 2023-02-26 DIAGNOSIS — R079 Chest pain, unspecified: Secondary | ICD-10-CM

## 2023-02-26 DIAGNOSIS — I10 Essential (primary) hypertension: Secondary | ICD-10-CM

## 2023-02-27 ENCOUNTER — Telehealth: Payer: Self-pay | Admitting: Cardiology

## 2023-02-27 DIAGNOSIS — E785 Hyperlipidemia, unspecified: Secondary | ICD-10-CM

## 2023-02-27 DIAGNOSIS — Z79899 Other long term (current) drug therapy: Secondary | ICD-10-CM

## 2023-02-27 LAB — LIPID PANEL
Chol/HDL Ratio: 3.4 ratio (ref 0.0–4.4)
Cholesterol, Total: 201 mg/dL — ABNORMAL HIGH (ref 100–199)
HDL: 60 mg/dL (ref >39)
LDL Chol Calc (NIH): 120 mg/dL — ABNORMAL HIGH (ref 0–99)
Triglycerides: 121 mg/dL (ref 0–149)
VLDL Cholesterol Cal: 21 mg/dL (ref 5–40)

## 2023-02-27 MED ORDER — ATORVASTATIN CALCIUM 40 MG PO TABS: 40.0000 mg | ORAL_TABLET | Freq: Every day | ORAL | 3 refills | Status: DC

## 2023-02-27 NOTE — Telephone Encounter (Signed)
-----   Message from Meriam Sprague, MD sent at 02/27/2023  6:45 AM EDT ----- Her cholesterol is still above goal. Can we increase her lipitor to 40mg  daily and repeat lipids in 8weeks?

## 2023-02-27 NOTE — Telephone Encounter (Signed)
Spoke with patient and discussed lab results.  Per Dr. Mayford Knife: Her cholesterol is still above goal. Can we increase her lipitor to 40mg  daily and repeat lipids in 8weeks?   Patient verbalized understanding. She states she would like to schedule lab appointment when it is closer to time so that she does not forget (she will need to schedule transportation). Lipids to be drawn end of June 2024, order in Epic.  New Rx for atorvastatin 40mg  QD sent to pharmacy.

## 2023-03-31 ENCOUNTER — Other Ambulatory Visit: Payer: Self-pay

## 2023-03-31 ENCOUNTER — Emergency Department (HOSPITAL_COMMUNITY): Payer: Medicaid Other

## 2023-03-31 ENCOUNTER — Encounter (HOSPITAL_COMMUNITY): Payer: Self-pay | Admitting: Emergency Medicine

## 2023-03-31 ENCOUNTER — Inpatient Hospital Stay (HOSPITAL_COMMUNITY)
Admission: EM | Admit: 2023-03-31 | Discharge: 2023-04-03 | DRG: 192 | Disposition: A | Discharge status: Home / Self Care | Payer: Medicaid Other | Attending: Family Medicine | Admitting: Family Medicine

## 2023-03-31 ENCOUNTER — Emergency Department (HOSPITAL_COMMUNITY)
Admission: EM | Admit: 2023-03-31 | Discharge: 2023-03-31 | Disposition: A | Discharge status: Home / Self Care | Payer: Medicaid Other | Source: Home / Self Care | Attending: Emergency Medicine | Admitting: Emergency Medicine

## 2023-03-31 DIAGNOSIS — Z79899 Other long term (current) drug therapy: Secondary | ICD-10-CM

## 2023-03-31 DIAGNOSIS — Z87891 Personal history of nicotine dependence: Secondary | ICD-10-CM

## 2023-03-31 DIAGNOSIS — Z7952 Long term (current) use of systemic steroids: Secondary | ICD-10-CM

## 2023-03-31 DIAGNOSIS — J441 Chronic obstructive pulmonary disease with (acute) exacerbation: Principal | ICD-10-CM | POA: Diagnosis present

## 2023-03-31 DIAGNOSIS — I10 Essential (primary) hypertension: Secondary | ICD-10-CM | POA: Insufficient documentation

## 2023-03-31 DIAGNOSIS — R0603 Acute respiratory distress: Secondary | ICD-10-CM | POA: Diagnosis present

## 2023-03-31 DIAGNOSIS — J432 Centrilobular emphysema: Secondary | ICD-10-CM

## 2023-03-31 DIAGNOSIS — E039 Hypothyroidism, unspecified: Secondary | ICD-10-CM | POA: Diagnosis present

## 2023-03-31 DIAGNOSIS — Z79890 Hormone replacement therapy (postmenopausal): Secondary | ICD-10-CM

## 2023-03-31 DIAGNOSIS — E876 Hypokalemia: Secondary | ICD-10-CM | POA: Diagnosis present

## 2023-03-31 DIAGNOSIS — Z1152 Encounter for screening for COVID-19: Secondary | ICD-10-CM

## 2023-03-31 DIAGNOSIS — Z888 Allergy status to other drugs, medicaments and biological substances status: Secondary | ICD-10-CM

## 2023-03-31 DIAGNOSIS — Z8249 Family history of ischemic heart disease and other diseases of the circulatory system: Secondary | ICD-10-CM

## 2023-03-31 DIAGNOSIS — I160 Hypertensive urgency: Secondary | ICD-10-CM | POA: Diagnosis present

## 2023-03-31 DIAGNOSIS — Z833 Family history of diabetes mellitus: Secondary | ICD-10-CM

## 2023-03-31 DIAGNOSIS — E785 Hyperlipidemia, unspecified: Secondary | ICD-10-CM | POA: Diagnosis present

## 2023-03-31 LAB — CBC WITH DIFFERENTIAL/PLATELET
Abs Immature Granulocytes: 0.03 10*3/uL (ref 0.00–0.07)
Basophils Absolute: 0 10*3/uL (ref 0.0–0.1)
Basophils Relative: 0 %
Eosinophils Absolute: 0.3 10*3/uL (ref 0.0–0.5)
Eosinophils Relative: 3 %
HCT: 40 % (ref 36.0–46.0)
Hemoglobin: 12.9 g/dL (ref 12.0–15.0)
Immature Granulocytes: 0 %
Lymphocytes Relative: 27 %
Lymphs Abs: 2.5 10*3/uL (ref 0.7–4.0)
MCH: 29.9 pg (ref 26.0–34.0)
MCHC: 32.3 g/dL (ref 30.0–36.0)
MCV: 92.6 fL (ref 80.0–100.0)
Monocytes Absolute: 0.7 10*3/uL (ref 0.1–1.0)
Monocytes Relative: 8 %
Neutro Abs: 5.9 10*3/uL (ref 1.7–7.7)
Neutrophils Relative %: 62 %
Platelets: 300 10*3/uL (ref 150–400)
RBC: 4.32 MIL/uL (ref 3.87–5.11)
RDW: 13.5 % (ref 11.5–15.5)
WBC: 9.4 10*3/uL (ref 4.0–10.5)
nRBC: 0 % (ref 0.0–0.2)

## 2023-03-31 LAB — TROPONIN I (HIGH SENSITIVITY)
Troponin I (High Sensitivity): 6 ng/L (ref <18)
Troponin I (High Sensitivity): 6 ng/L (ref <18)

## 2023-03-31 LAB — BASIC METABOLIC PANEL
Anion gap: 10 (ref 5–15)
BUN: 25 mg/dL — ABNORMAL HIGH (ref 8–23)
CO2: 24 mmol/L (ref 22–32)
Calcium: 8.8 mg/dL — ABNORMAL LOW (ref 8.9–10.3)
Chloride: 108 mmol/L (ref 98–111)
Creatinine, Ser: 1.02 mg/dL — ABNORMAL HIGH (ref 0.44–1.00)
GFR, Estimated: >60 mL/min (ref >60)
Glucose, Bld: 106 mg/dL — ABNORMAL HIGH (ref 70–99)
Potassium: 3 mmol/L — ABNORMAL LOW (ref 3.5–5.1)
Sodium: 142 mmol/L (ref 135–145)

## 2023-03-31 LAB — I-STAT VENOUS BLOOD GAS, ED
Acid-base deficit: 3 mmol/L — ABNORMAL HIGH (ref 0.0–2.0)
Bicarbonate: 19.8 mmol/L — ABNORMAL LOW (ref 20.0–28.0)
Calcium, Ion: 0.84 mmol/L — CL (ref 1.15–1.40)
HCT: 30 % — ABNORMAL LOW (ref 36.0–46.0)
Hemoglobin: 10.2 g/dL — ABNORMAL LOW (ref 12.0–15.0)
O2 Saturation: 77 %
Potassium: 3.4 mmol/L — ABNORMAL LOW (ref 3.5–5.1)
Sodium: 145 mmol/L (ref 135–145)
TCO2: 21 mmol/L — ABNORMAL LOW (ref 22–32)
pCO2, Ven: 28.8 mmHg — ABNORMAL LOW (ref 44–60)
pH, Ven: 7.445 — ABNORMAL HIGH (ref 7.25–7.43)
pO2, Ven: 39 mmHg (ref 32–45)

## 2023-03-31 LAB — BRAIN NATRIURETIC PEPTIDE: B Natriuretic Peptide: 15.1 pg/mL (ref 0.0–100.0)

## 2023-03-31 MED ORDER — IOHEXOL 350 MG/ML SOLN
75.0000 mL | Freq: Once | INTRAVENOUS | Status: AC | PRN
  Administered 2023-03-31: 75 mL via INTRAVENOUS

## 2023-03-31 MED ORDER — AZITHROMYCIN 250 MG PO TABS
500.0000 mg | ORAL_TABLET | Freq: Once | ORAL | Status: AC
  Administered 2023-03-31: 500 mg via ORAL
  Filled 2023-03-31: qty 2

## 2023-03-31 MED ORDER — AZITHROMYCIN 250 MG PO TABS: 250.0000 mg | ORAL_TABLET | Freq: Every day | ORAL | 0 refills | Status: DC

## 2023-03-31 MED ORDER — IPRATROPIUM-ALBUTEROL 0.5-2.5 (3) MG/3ML IN SOLN
3.0000 mL | Freq: Once | RESPIRATORY_TRACT | Status: AC
  Administered 2023-03-31: 3 mL via RESPIRATORY_TRACT
  Filled 2023-03-31: qty 3

## 2023-03-31 MED ORDER — POTASSIUM CHLORIDE CRYS ER 20 MEQ PO TBCR
40.0000 meq | EXTENDED_RELEASE_TABLET | Freq: Once | ORAL | Status: AC
  Administered 2023-03-31: 40 meq via ORAL
  Filled 2023-03-31: qty 2

## 2023-03-31 MED ORDER — SODIUM CHLORIDE 0.9 % IV SOLN
500.0000 mg | Freq: Once | INTRAVENOUS | Status: DC
  Filled 2023-03-31: qty 5

## 2023-03-31 MED ORDER — PREDNISONE 20 MG PO TABS: 40.0000 mg | ORAL_TABLET | Freq: Every day | ORAL | 0 refills | Status: DC

## 2023-03-31 MED ORDER — ACETAMINOPHEN 325 MG PO TABS
650.0000 mg | ORAL_TABLET | Freq: Once | ORAL | Status: AC
  Administered 2023-03-31: 650 mg via ORAL
  Filled 2023-03-31: qty 2

## 2023-03-31 NOTE — Discharge Instructions (Addendum)
I am glad you are feeling better.  Chest x-ray was not concerning for pneumonia.  CT of the chest was not concerning for a blood clot in your lung.  Your potassium was low, so I provided you with a oral potassium supplement in the ED today.  Rest of blood labs were reassuring.  I believe you are having a mild COPD exacerbation.  I am prescribing you steroids and antibiotics for you to take over the next couple days.  I have also given you a dose of antibiotics here in the emergency room.  I recommend following up with your primary care provider-you may need stepup in treatment for your COPD symptoms.  Seek emergency care if experiencing any new or worsening symptoms such as severe shortness of breath, severe chest pain, severe fevers.

## 2023-03-31 NOTE — ED Provider Notes (Signed)
[place] [facility]ERGENCY DEPARTMENT AT [facility] Provider Note   CSN: [id number] Arrival date & time: 03/31/23  1644     History  Chief Complaint  Patient presents with   Shortness of Breath    Mariah Foster is a 62 y.o. female with past medical history of COPD who presents to the ED concern for shortness of breath.  Patient states that symptoms started this morning when she woke up and progressed in severity over the course of the day.  Patient states that she uses albuterol as needed-which has not helped her today.  Patient also endorsing productive cough with white sputum.  Patient also complaining of left chest pain that is reproducible to palpation.  Denies fever, nausea, vomiting, abdominal pain.  Denies history of blood clots, recent immobilization, calf tenderness.    HPI     Home Medications Prior to Admission medications   Medication Sig Start Date End Date Taking? Authorizing Provider  atorvastatin (LIPITOR) 40 MG tablet Take 1 tablet (40 mg total) by mouth daily. 02/27/23   Meriam Sprague, MD  azithromycin (ZITHROMAX) 250 MG tablet Take 1 tablet (250 mg total) by mouth daily for 5 days. Take 1 every day until finished. 03/31/23 04/05/23 Yes Albertia Carvin, Charlotte Sanes F, PA-C  predniSONE (DELTASONE) 20 MG tablet Take 2 tablets (40 mg total) by mouth daily for 5 days. 03/31/23 04/05/23 Yes Valrie Hart F, PA-C  acetaminophen (TYLENOL) 500 MG tablet Take 1 tablet (500 mg total) by mouth every 6 (six) hours as needed. 07/01/19   Dahlia Byes A, NP  albuterol (PROVENTIL HFA;VENTOLIN HFA) 108 (90 Base) MCG/ACT inhaler Inhale 1-2 puffs into the lungs every 6 (six) hours as needed for wheezing or shortness of breath.    [provider]  albuterol (PROVENTIL) (2.5 MG/3ML) 0.083% nebulizer solution Take 3 mLs (2.5 mg total) by nebulization 3 (three) times daily as needed for wheezing or shortness of breath. 09/10/22   Pricilla Loveless, MD  amLODipine (NORVASC) 5 MG tablet  Take 1 tablet (5 mg total) by mouth daily. 09/10/22   Pricilla Loveless, MD  baclofen (LIORESAL) 10 MG tablet Take 10 mg by mouth 2 (two) times daily as needed for muscle spasms. 09/15/21   [provider]  Budeson-Glycopyrrol-Formoterol (BREZTRI AEROSPHERE) 160-9-4.8 MCG/ACT AERO Inhale 2 puffs into the lungs in the morning and at bedtime. 01/30/23   Martina Sinner, MD  Budeson-Glycopyrrol-Formoterol (BREZTRI AEROSPHERE) 160-9-4.8 MCG/ACT AERO Inhale 2 puffs into the lungs in the morning and at bedtime. 01/30/23   Martina Sinner, MD  cyclobenzaprine (FLEXERIL) 5 MG tablet Take 1 tablet (5 mg total) by mouth 2 (two) times daily as needed for muscle spasms. 01/14/21   Bing Neighbors, NP  dicyclomine (BENTYL) 20 MG tablet Take 1 tablet (20 mg total) by mouth 3 (three) times daily before meals. 11/30/22   Kommor, Madison, MD  DULoxetine (CYMBALTA) 30 MG capsule Take 30 mg by mouth 2 (two) times daily. 08/29/21   [provider]  Ferrous Sulfate (IRON) 325 (65 Fe) MG TABS Take 1 tablet (325 mg total) by mouth 2 (two) times daily. Patient taking differently: Take 325 mg by mouth daily. 10/15/17   Meryl Dare, MD  fluticasone (FLONASE) 50 MCG/ACT nasal spray Place 2 sprays into both nostrils daily. 11/06/17   Couture, Cortni S, PA-C  irbesartan (AVAPRO) 300 MG tablet Take 1 tablet (300 mg total) by mouth daily. 01/01/23   Meriam Sprague, MD  levothyroxine (SYNTHROID) 75 MCG  tablet Take 75 mcg by mouth daily. 08/29/21   [provider]  lidocaine (LIDODERM) 5 % Place 1 patch onto the skin daily. Remove & Discard patch within 12 hours or as directed by MD 11/30/22   Kommor, Wyn Forster, MD  omeprazole (PRILOSEC) 40 MG capsule TAKE 1 CAPSULE BY MOUTH ONCE DAILY 09/11/18   Unk Lightning, PA  polyethylene glycol (MIRALAX) 17 g packet Take 17 g by mouth daily. 11/30/22   Kommor, Madison, MD  potassium chloride SA (KLOR-CON M) 20 MEQ tablet Take 1 tablet (20 mEq total) by mouth  daily. 09/10/22   Pricilla Loveless, MD  traMADol (ULTRAM) 50 MG tablet Take 50 mg by mouth every 6 (six) hours as needed. 11/29/21   [provider]  traZODone (DESYREL) 50 MG tablet Take 50-100 mg by mouth at bedtime as needed for sleep. 09/27/21   [provider]  potassium chloride (K-DUR) 10 MEQ tablet Take 1 tablet (10 mEq total) by mouth daily. 03/14/17 05/26/19  Maxwell Caul, PA-C      Allergies    Flagyl [metronidazole] and Metronidazole hcl    Review of Systems   Review of Systems  Respiratory:  Positive for shortness of breath.   Cardiovascular:  Positive for chest pain.    Physical Exam Updated Vital Signs BP (!) 166/86   Pulse 75   Temp 98 F (36.7 C) (Oral)   Resp (!) 25   Ht 5\' 4"  (1.626 m)   Wt 67.1 kg   SpO2 100%   BMI 25.40 kg/m  Physical Exam Vitals and nursing note reviewed.  Constitutional:      General: She is not in acute distress.    Appearance: She is not toxic-appearing or diaphoretic.  HENT:     Head: Normocephalic and atraumatic.     Mouth/Throat:     Mouth: Mucous membranes are moist.     Pharynx: No oropharyngeal exudate or posterior oropharyngeal erythema.  Eyes:     General: No scleral icterus.       Right eye: No discharge.        Left eye: No discharge.     Conjunctiva/sclera: Conjunctivae normal.  Neck:     Vascular: No JVD.  Cardiovascular:     Rate and Rhythm: Normal rate and regular rhythm.     Pulses: Normal pulses.     Heart sounds: Normal heart sounds. No murmur heard. Pulmonary:     Effort: Pulmonary effort is normal. No respiratory distress.     Breath sounds: No wheezing or rales.     Comments: Diffuse breath sounds. Mild rhonchi. Tachypnea. Patient on 4L [place] for comfort - O2 100% on room air. Chest:     Comments: Left chest tenderness to palpation Abdominal:     Tenderness: There is no abdominal tenderness.  Musculoskeletal:     Right lower leg: No edema.     Left lower leg: No edema.     Comments:  No pitting edema  Skin:    General: Skin is warm and dry.     Findings: No rash.  Neurological:     General: No focal deficit present.     Mental Status: She is alert. Mental status is at baseline.  Psychiatric:        Mood and Affect: Mood normal.     ED Results / Procedures / Treatments   Labs (all labs ordered are listed, but only abnormal results are displayed) Labs Reviewed  BASIC METABOLIC PANEL - Abnormal;  Notable for the following components:      Result Value   Potassium 3.0 (*)    Glucose, Bld 106 (*)    BUN 25 (*)    Creatinine, Ser 1.02 (*)    Calcium 8.8 (*)    All other components within normal limits  I-STAT VENOUS BLOOD GAS, ED - Abnormal; Notable for the following components:   pH, Ven 7.445 (*)    pCO2, Ven 28.8 (*)    Bicarbonate 19.8 (*)    TCO2 21 (*)    Acid-base deficit 3.0 (*)    Potassium 3.4 (*)    Calcium, Ion 0.84 (*)    HCT 30.0 (*)    Hemoglobin 10.2 (*)    All other components within normal limits  CBC WITH DIFFERENTIAL/PLATELET  BRAIN NATRIURETIC PEPTIDE  I-STAT VENOUS BLOOD GAS, ED  TROPONIN I (HIGH SENSITIVITY)  TROPONIN I (HIGH SENSITIVITY)    EKG EKG Interpretation  Date/Time:  Sunday March 31 2023 18:36:07 EDT Ventricular Rate:  85 PR Interval:  142 QRS Duration: 77 QT Interval:  400 QTC Calculation: 476 R Axis:   78 Text Interpretation: Sinus rhythm no stemi, similar to prior Confirmed by Tanda Rockers ([id number]) on 03/31/2023 7:03:59 PM  Radiology CT Angio Chest PE W/Cm &/Or Wo Cm  Result Date: 03/31/2023 CLINICAL DATA:  Pulmonary embolism (PE) suspected, high prob Shortness of breath. EXAM: CT ANGIOGRAPHY CHEST WITH CONTRAST TECHNIQUE: Multidetector CT imaging of the chest was performed using the standard protocol during bolus administration of intravenous contrast. Multiplanar CT image reconstructions and MIPs were obtained to evaluate the vascular anatomy. RADIATION DOSE REDUCTION: This exam was performed according to the  departmental dose-optimization program which includes automated exposure control, adjustment of the mA and/or kV according to patient size and/or use of iterative reconstruction technique. CONTRAST:  75mL OMNIPAQUE IOHEXOL 350 MG/ML SOLN COMPARISON:  Radiograph earlier today. Chest CTA 12/15/2021 FINDINGS: Cardiovascular: There are no filling defects within the pulmonary arteries to suggest pulmonary embolus. Breathing motion artifact at the bases limits detailed assessment. The central pulmonary artery is mildly dilated at 3.2 cm as before. The heart is normal in size. Coronary artery calcifications. No pericardial effusion. Contrast refluxes into the hepatic veins and IVC. Aortic atherosclerosis without aneurysm or acute aortic findings. Mediastinum/Nodes: Reactive appearing bilateral hilar nodes, not enlarged by size criteria. There is no mediastinal adenopathy. Esophagus is patulous. Lungs/Pleura: Apical predominant emphysema. No acute or focal airspace disease. No pleural effusion, pneumothorax or features of pulmonary edema. No suspicious pulmonary nodule. Upper Abdomen: Contrast refluxes into the hepatic veins and IVC. Musculoskeletal: There are no acute or suspicious osseous abnormalities. Review of the MIP images confirms the above findings. IMPRESSION: 1. No pulmonary embolus. 2. Moderate to advanced apical predominant emphysema. 3. Contrast refluxes into the hepatic veins and IVC consistent, suggesting elevated right heart pressures. Aortic Atherosclerosis (ICD10-I70.0) and Emphysema (ICD10-J43.9). Electronically Signed   By: Narda Rutherford M.D.   On: 03/31/2023 20:46   DG Chest Port 1 View  Result Date: 03/31/2023 CLINICAL DATA:  Shortness of breath EXAM: PORTABLE CHEST 1 VIEW COMPARISON:  Chest x-ray 12/15/2021 FINDINGS: The heart size and mediastinal contours are within normal limits. Both lungs are clear. The visualized skeletal structures are unremarkable. IMPRESSION: No active disease.  Electronically Signed   By: Darliss Cheney M.D.   On: 03/31/2023 17:49    Procedures Procedures    Medications Ordered in ED Medications  azithromycin (ZITHROMAX) 500 mg in sodium chloride 0.9 % 250 mL IVPB (  has no administration in time range)  acetaminophen (TYLENOL) tablet 650 mg (650 mg Oral Given 03/31/23 1917)  potassium chloride SA (KLOR-CON M) CR tablet 40 mEq (40 mEq Oral Given 03/31/23 1917)  ipratropium-albuterol (DUONEB) 0.5-2.5 (3) MG/3ML nebulizer solution 3 mL (3 mLs Nebulization Given 03/31/23 1931)  iohexol (OMNIPAQUE) 350 MG/ML injection 75 mL (75 mLs Intravenous Contrast Given 03/31/23 2033)    ED Course/ Medical Decision Making/ A&P                             Medical Decision Making Amount and/or Complexity of Data Reviewed Labs: ordered. Radiology: ordered.  Risk OTC drugs. Prescription drug management.   This patient presents to the ED for concern of shortness of breath, this involves an extensive number of treatment options, and is a complaint that carries with it a high risk of complications and morbidity.  The differential diagnosis includes Anxiety, Anaphylaxis/Angioedema, Aspirated FB, Arrhythmia, CHF, Asthma, COPD, PNA, COVID/Flu/RSV, STEMI, Tamponade, TPNX, DKA, Sepsis, Toxin   Co morbidities that complicate the patient evaluation  COPD, HTN     Lab Tests:  I Ordered, and personally interpreted labs.  The pertinent results include:   - BMP: hypokalemia; elevated Cr that is baseline for pt - BNP: 15 - Trop: initial and repeat within normal limits - CBC: No concern for anemia or leukocytosis - VBG: 7.445  28.8  19.8    Imaging Studies ordered:  I ordered imaging studies including   -CTA:  1. No pulmonary embolus. 2. Moderate to advanced apical predominant emphysema. 3. Contrast refluxes into the hepatic veins and IVC consistent, suggesting elevated right heart pressures.  -chest xray: No active disease.   I independently visualized and  interpreted imaging I agree with the radiologist interpretation   Cardiac Monitoring: / EKG:  The patient was maintained on a cardiac monitor.  I personally viewed and interpreted the cardiac monitored which showed an underlying rhythm of: sinus rhythm without acute ST changes or arrhythmias    Problem List / ED Course / Critical interventions / Medication management  Patient presenting to the ED concerned for shortness of breath.  EMS provided patient with albuterol and Atrovent on the way to the ED.  On initial examination, patient with tachypnea and 100% oxygen saturation on 4 L nasal cannula.  I turned patient's oxygen off to assess her oxygen saturation on room air-which remained at 100%. Patient's blood labs with hypokalemia-otherwise reassuring.  Provided patient with oral potassium supplement.  Troponin and EKG reassuring.  BNP within normal limits.  No leukocytosis or anemia.  Chest x-ray without concern for pneumonia.  CTA without concern for pulmonary embolism.  Patient chest pain is reproducible to palpation-more concerning for MSK. Patient still complaining of shortness of breath after albuterol and atrovent.  Provided patient with a DuoNeb nebulizer-which helped relieve the patient's SOB.  Patient's oxygen saturations >97% on room air.  I believe patient is having a mild acute COPD exacerbation at this time.  Will provide patient with a dose of antibiotics here in the emergency room.  Discharging patient with a course of antibiotics and steroids.  Recommended patient follow-up with primary care provider as she may need step up in COPD management. I have reviewed the patients home medicines and have made adjustments as needed Patient was given return precautions. Patient stable for discharge at this time. Patient verbalized understanding of plan.  DDx: These are considered less likely due to history  of present illness and physical exam findings Anaphylaxis/Angioedema: oxygen 100% on  RA Aspirated FB: no history of choking Arrhythmia: EKG with NSR Asthma/COPD/PNA/COVID/Flu/RSV: Lungs clear to auscultation bilaterally and O2 sat 100% on RA STEMI: EKG and troponins reassuring CHF: no physical exam findings TPNX: Lungs clear to auscultation bilaterally Sepsis: afebrile and other vital signs stable   Social Determinants of Health:  none           Final Clinical Impression(s) / ED Diagnoses Final diagnoses:  COPD exacerbation (HCC)    Rx / DC Orders ED Discharge Orders          Ordered    azithromycin (ZITHROMAX) 250 MG tablet  Daily        03/31/23 2058    predniSONE (DELTASONE) 20 MG tablet  Daily        03/31/23 2058              Dorthy Cooler, PA-C 03/31/23 2120    Tanda Rockers A, DO 04/01/23 [phone]

## 2023-03-31 NOTE — ED Triage Notes (Signed)
PT BIB [facility] from home. Patient been having shortness of breath since last night and unable to sleep well. She took her inhaler and did not relieve her symptom. EMS gave her Albuterol 5 mg and Atrovent 0.5 mg and she continue to have work of breathing. She was placed on nasal cannula 4 liters. Per EMS patient is unable to walk 5  feet without getting short of breath. EMS report abdominal swelling and pt c/o left shoulder pain. History of COPD, but does not use oxygen at home. Vital signs: BP 184/ 106, HR 96, RR 32, Saturation after neb treatment 100%, CBG 191.

## 2023-03-31 NOTE — ED Triage Notes (Signed)
The pt reports that she has just been discharged from here and when she stepped outside she had more difficulty breathing

## 2023-03-31 NOTE — ED Notes (Signed)
Patient c/o unable to breath and not moving air well. MD made aware

## 2023-04-01 ENCOUNTER — Emergency Department (HOSPITAL_COMMUNITY): Payer: Medicaid Other

## 2023-04-01 ENCOUNTER — Encounter (HOSPITAL_COMMUNITY): Payer: Self-pay

## 2023-04-01 DIAGNOSIS — E876 Hypokalemia: Secondary | ICD-10-CM | POA: Diagnosis present

## 2023-04-01 DIAGNOSIS — E039 Hypothyroidism, unspecified: Secondary | ICD-10-CM | POA: Diagnosis present

## 2023-04-01 DIAGNOSIS — Z833 Family history of diabetes mellitus: Secondary | ICD-10-CM | POA: Diagnosis not present

## 2023-04-01 DIAGNOSIS — Z888 Allergy status to other drugs, medicaments and biological substances status: Secondary | ICD-10-CM | POA: Diagnosis not present

## 2023-04-01 DIAGNOSIS — Z1152 Encounter for screening for COVID-19: Secondary | ICD-10-CM | POA: Diagnosis not present

## 2023-04-01 DIAGNOSIS — Z8249 Family history of ischemic heart disease and other diseases of the circulatory system: Secondary | ICD-10-CM | POA: Diagnosis not present

## 2023-04-01 DIAGNOSIS — R0603 Acute respiratory distress: Secondary | ICD-10-CM | POA: Diagnosis not present

## 2023-04-01 DIAGNOSIS — Z87891 Personal history of nicotine dependence: Secondary | ICD-10-CM | POA: Diagnosis not present

## 2023-04-01 DIAGNOSIS — J441 Chronic obstructive pulmonary disease with (acute) exacerbation: Secondary | ICD-10-CM | POA: Diagnosis not present

## 2023-04-01 DIAGNOSIS — E785 Hyperlipidemia, unspecified: Secondary | ICD-10-CM | POA: Diagnosis present

## 2023-04-01 DIAGNOSIS — I160 Hypertensive urgency: Secondary | ICD-10-CM | POA: Diagnosis present

## 2023-04-01 DIAGNOSIS — Z79899 Other long term (current) drug therapy: Secondary | ICD-10-CM | POA: Diagnosis not present

## 2023-04-01 DIAGNOSIS — Z79890 Hormone replacement therapy (postmenopausal): Secondary | ICD-10-CM | POA: Diagnosis not present

## 2023-04-01 DIAGNOSIS — Z7952 Long term (current) use of systemic steroids: Secondary | ICD-10-CM | POA: Diagnosis not present

## 2023-04-01 DIAGNOSIS — I10 Essential (primary) hypertension: Secondary | ICD-10-CM | POA: Diagnosis present

## 2023-04-01 LAB — CBC WITH DIFFERENTIAL/PLATELET
Abs Immature Granulocytes: 0.03 10*3/uL (ref 0.00–0.07)
Basophils Absolute: 0 10*3/uL (ref 0.0–0.1)
Basophils Relative: 0 %
Eosinophils Absolute: 0.3 10*3/uL (ref 0.0–0.5)
Eosinophils Relative: 3 %
HCT: 39 % (ref 36.0–46.0)
Hemoglobin: 12.7 g/dL (ref 12.0–15.0)
Immature Granulocytes: 0 %
Lymphocytes Relative: 27 %
Lymphs Abs: 2.6 10*3/uL (ref 0.7–4.0)
MCH: 30 pg (ref 26.0–34.0)
MCHC: 32.6 g/dL (ref 30.0–36.0)
MCV: 92 fL (ref 80.0–100.0)
Monocytes Absolute: 0.8 10*3/uL (ref 0.1–1.0)
Monocytes Relative: 8 %
Neutro Abs: 6 10*3/uL (ref 1.7–7.7)
Neutrophils Relative %: 62 %
Platelets: 308 10*3/uL (ref 150–400)
RBC: 4.24 MIL/uL (ref 3.87–5.11)
RDW: 13.7 % (ref 11.5–15.5)
WBC: 9.7 10*3/uL (ref 4.0–10.5)
nRBC: 0 % (ref 0.0–0.2)

## 2023-04-01 LAB — I-STAT VENOUS BLOOD GAS, ED
Acid-base deficit: 3 mmol/L — ABNORMAL HIGH (ref 0.0–2.0)
Bicarbonate: 22.5 mmol/L (ref 20.0–28.0)
Calcium, Ion: 1.13 mmol/L — ABNORMAL LOW (ref 1.15–1.40)
HCT: 38 % (ref 36.0–46.0)
Hemoglobin: 12.9 g/dL (ref 12.0–15.0)
O2 Saturation: 69 %
Patient temperature: 37
Potassium: 3 mmol/L — ABNORMAL LOW (ref 3.5–5.1)
Sodium: 144 mmol/L (ref 135–145)
TCO2: 24 mmol/L (ref 22–32)
pCO2, Ven: 40.7 mmHg — ABNORMAL LOW (ref 44–60)
pH, Ven: 7.351 (ref 7.25–7.43)
pO2, Ven: 38 mmHg (ref 32–45)

## 2023-04-01 LAB — BASIC METABOLIC PANEL
Anion gap: 11 (ref 5–15)
BUN: 17 mg/dL (ref 8–23)
CO2: 23 mmol/L (ref 22–32)
Calcium: 8.4 mg/dL — ABNORMAL LOW (ref 8.9–10.3)
Chloride: 107 mmol/L (ref 98–111)
Creatinine, Ser: 0.82 mg/dL (ref 0.44–1.00)
GFR, Estimated: >60 mL/min (ref >60)
Glucose, Bld: 111 mg/dL — ABNORMAL HIGH (ref 70–99)
Potassium: 3 mmol/L — ABNORMAL LOW (ref 3.5–5.1)
Sodium: 141 mmol/L (ref 135–145)

## 2023-04-01 LAB — BRAIN NATRIURETIC PEPTIDE: B Natriuretic Peptide: 14.8 pg/mL (ref 0.0–100.0)

## 2023-04-01 LAB — TROPONIN I (HIGH SENSITIVITY)
Troponin I (High Sensitivity): 7 ng/L (ref <18)
Troponin I (High Sensitivity): 8 ng/L (ref <18)

## 2023-04-01 MED ORDER — IPRATROPIUM-ALBUTEROL 0.5-2.5 (3) MG/3ML IN SOLN
3.0000 mL | Freq: Four times a day (QID) | RESPIRATORY_TRACT | Status: DC
  Administered 2023-04-01 – 2023-04-02 (×4): 3 mL via RESPIRATORY_TRACT
  Filled 2023-04-01 (×4): qty 3

## 2023-04-01 MED ORDER — POTASSIUM CHLORIDE CRYS ER 20 MEQ PO TBCR
20.0000 meq | EXTENDED_RELEASE_TABLET | ORAL | Status: AC
  Administered 2023-04-01: 20 meq via ORAL
  Filled 2023-04-01: qty 1

## 2023-04-01 MED ORDER — AMLODIPINE BESYLATE 5 MG PO TABS
5.0000 mg | ORAL_TABLET | Freq: Every day | ORAL | Status: DC
  Administered 2023-04-01 – 2023-04-03 (×3): 5 mg via ORAL
  Filled 2023-04-01 (×3): qty 1

## 2023-04-01 MED ORDER — METHOCARBAMOL 500 MG PO TABS
500.0000 mg | ORAL_TABLET | Freq: Once | ORAL | Status: AC
  Administered 2023-04-01: 500 mg via ORAL
  Filled 2023-04-01: qty 1

## 2023-04-01 MED ORDER — ALBUTEROL SULFATE (2.5 MG/3ML) 0.083% IN NEBU: 2.5000 mg | INHALATION_SOLUTION | RESPIRATORY_TRACT | Status: DC | PRN

## 2023-04-01 MED ORDER — ATORVASTATIN CALCIUM 40 MG PO TABS
40.0000 mg | ORAL_TABLET | Freq: Every day | ORAL | Status: DC
  Administered 2023-04-01 – 2023-04-02 (×2): 40 mg via ORAL
  Filled 2023-04-01 (×2): qty 1

## 2023-04-01 MED ORDER — METHYLPREDNISOLONE SODIUM SUCC 125 MG IJ SOLR
125.0000 mg | Freq: Once | INTRAMUSCULAR | Status: AC
  Administered 2023-04-01: 125 mg via INTRAVENOUS
  Filled 2023-04-01: qty 2

## 2023-04-01 MED ORDER — METHOCARBAMOL 500 MG PO TABS: 500.0000 mg | ORAL_TABLET | Freq: Three times a day (TID) | ORAL | Status: DC | PRN

## 2023-04-01 MED ORDER — METHYLPREDNISOLONE SODIUM SUCC 125 MG IJ SOLR
80.0000 mg | Freq: Two times a day (BID) | INTRAMUSCULAR | Status: DC
  Administered 2023-04-01: 80 mg via INTRAVENOUS
  Filled 2023-04-01: qty 2

## 2023-04-01 MED ORDER — MELATONIN 3 MG PO TABS
3.0000 mg | ORAL_TABLET | Freq: Every evening | ORAL | Status: DC | PRN
  Administered 2023-04-02: 3 mg via ORAL
  Filled 2023-04-01: qty 1

## 2023-04-01 MED ORDER — IPRATROPIUM-ALBUTEROL 0.5-2.5 (3) MG/3ML IN SOLN
3.0000 mL | Freq: Four times a day (QID) | RESPIRATORY_TRACT | Status: DC
  Administered 2023-04-01: 3 mL via RESPIRATORY_TRACT
  Filled 2023-04-01: qty 3

## 2023-04-01 MED ORDER — LEVOTHYROXINE SODIUM 75 MCG PO TABS
75.0000 ug | ORAL_TABLET | Freq: Every day | ORAL | Status: DC
  Administered 2023-04-02 – 2023-04-03 (×2): 75 ug via ORAL
  Filled 2023-04-01 (×2): qty 1

## 2023-04-01 MED ORDER — CALCIUM GLUCONATE-NACL 1-0.675 GM/50ML-% IV SOLN
1.0000 g | Freq: Once | INTRAVENOUS | Status: AC
  Administered 2023-04-01: 1000 mg via INTRAVENOUS
  Filled 2023-04-01: qty 50

## 2023-04-01 MED ORDER — ACETAMINOPHEN 650 MG RE SUPP: 650.0000 mg | Freq: Four times a day (QID) | RECTAL | Status: DC | PRN

## 2023-04-01 MED ORDER — PREDNISONE 20 MG PO TABS
40.0000 mg | ORAL_TABLET | Freq: Every day | ORAL | Status: DC
  Administered 2023-04-02: 40 mg via ORAL
  Filled 2023-04-01: qty 2

## 2023-04-01 MED ORDER — ALBUTEROL SULFATE (2.5 MG/3ML) 0.083% IN NEBU
2.5000 mg | INHALATION_SOLUTION | Freq: Once | RESPIRATORY_TRACT | Status: AC
  Administered 2023-04-01: 2.5 mg via RESPIRATORY_TRACT
  Filled 2023-04-01: qty 3

## 2023-04-01 MED ORDER — POTASSIUM CHLORIDE CRYS ER 20 MEQ PO TBCR
40.0000 meq | EXTENDED_RELEASE_TABLET | Freq: Once | ORAL | Status: AC
  Administered 2023-04-01: 40 meq via ORAL
  Filled 2023-04-01: qty 2

## 2023-04-01 MED ORDER — BUDESONIDE 0.5 MG/2ML IN SUSP
0.5000 mg | Freq: Two times a day (BID) | RESPIRATORY_TRACT | Status: DC
  Administered 2023-04-01 – 2023-04-03 (×5): 0.5 mg via RESPIRATORY_TRACT
  Filled 2023-04-01 (×5): qty 2

## 2023-04-01 MED ORDER — ZOLPIDEM TARTRATE 5 MG PO TABS
5.0000 mg | ORAL_TABLET | Freq: Every evening | ORAL | Status: DC | PRN
  Administered 2023-04-01 – 2023-04-02 (×2): 5 mg via ORAL
  Filled 2023-04-01 (×2): qty 1

## 2023-04-01 MED ORDER — ARFORMOTEROL TARTRATE 15 MCG/2ML IN NEBU
15.0000 ug | INHALATION_SOLUTION | Freq: Two times a day (BID) | RESPIRATORY_TRACT | Status: DC
  Administered 2023-04-01 – 2023-04-03 (×5): 15 ug via RESPIRATORY_TRACT
  Filled 2023-04-01 (×5): qty 2

## 2023-04-01 MED ORDER — MAGNESIUM SULFATE 2 GM/50ML IV SOLN
2.0000 g | Freq: Once | INTRAVENOUS | Status: AC
  Administered 2023-04-01: 2 g via INTRAVENOUS
  Filled 2023-04-01: qty 50

## 2023-04-01 MED ORDER — SODIUM CHLORIDE 0.9% FLUSH
3.0000 mL | Freq: Two times a day (BID) | INTRAVENOUS | Status: DC
  Administered 2023-04-01 – 2023-04-03 (×5): 3 mL via INTRAVENOUS

## 2023-04-01 MED ORDER — SODIUM CHLORIDE 0.9 % IV SOLN
500.0000 mg | INTRAVENOUS | Status: DC
  Administered 2023-04-01 – 2023-04-03 (×3): 500 mg via INTRAVENOUS
  Filled 2023-04-01 (×3): qty 5

## 2023-04-01 MED ORDER — METHYLPREDNISOLONE SODIUM SUCC 125 MG IJ SOLR
80.0000 mg | Freq: Two times a day (BID) | INTRAMUSCULAR | Status: AC
  Administered 2023-04-01: 80 mg via INTRAVENOUS
  Filled 2023-04-01: qty 2

## 2023-04-01 MED ORDER — IRBESARTAN 300 MG PO TABS
300.0000 mg | ORAL_TABLET | Freq: Every day | ORAL | Status: DC
  Administered 2023-04-01 – 2023-04-03 (×3): 300 mg via ORAL
  Filled 2023-04-01 (×4): qty 1
  Filled 2023-04-01: qty 4
  Filled 2023-04-01: qty 1

## 2023-04-01 MED ORDER — ENOXAPARIN SODIUM 40 MG/0.4ML IJ SOSY
40.0000 mg | PREFILLED_SYRINGE | INTRAMUSCULAR | Status: DC
  Administered 2023-04-01 – 2023-04-03 (×3): 40 mg via SUBCUTANEOUS
  Filled 2023-04-01 (×3): qty 0.4

## 2023-04-01 MED ORDER — ACETAMINOPHEN 325 MG PO TABS
650.0000 mg | ORAL_TABLET | Freq: Four times a day (QID) | ORAL | Status: DC | PRN
  Administered 2023-04-01 – 2023-04-02 (×2): 650 mg via ORAL
  Filled 2023-04-01 (×2): qty 2

## 2023-04-01 MED ORDER — GUAIFENESIN ER 600 MG PO TB12
600.0000 mg | ORAL_TABLET | Freq: Two times a day (BID) | ORAL | Status: DC
  Administered 2023-04-01 – 2023-04-03 (×5): 600 mg via ORAL
  Filled 2023-04-01 (×5): qty 1

## 2023-04-01 MED ORDER — HYDRALAZINE HCL 20 MG/ML IJ SOLN: 10.0000 mg | INTRAMUSCULAR | Status: DC | PRN

## 2023-04-01 MED ORDER — IPRATROPIUM-ALBUTEROL 0.5-2.5 (3) MG/3ML IN SOLN
3.0000 mL | Freq: Once | RESPIRATORY_TRACT | Status: AC
  Administered 2023-04-01: 3 mL via RESPIRATORY_TRACT
  Filled 2023-04-01: qty 3

## 2023-04-01 MED ORDER — ONDANSETRON HCL 4 MG/2ML IJ SOLN: 4.0000 mg | Freq: Four times a day (QID) | INTRAMUSCULAR | Status: DC | PRN

## 2023-04-01 NOTE — Evaluation (Signed)
Occupational Therapy Evaluation Patient Details Name: Mariah Foster MRN: [id number] DOB: 08/26/1961 Today's Date: 04/01/2023   History of Present Illness Pt is a 62 y/o female admitted with acute COPD exacerbation. Pt initially presented to ED 6/2, received treatment with improvements and discharged though returned to hospital when SOB occurred on the way home. PMH: COPD, HTN, HLD   Clinical Impression   PTA, pt lives with significant other who has had a recent LE amputation requiring pt to provide assistance for all daily tasks. Pt reports typically completely Independent in ADLs, IADLs (aside from driving) and mobility without AD. Pt presents now able to manage ADLs/mobility Independently though benefits from cues for pacing/breathing. Emphasized energy conservation strategies (see ADL section) with handout provided for improved carryover. Anticipate no OT needs at acute level or on DC.       Recommendations for follow up therapy are one component of a multi-disciplinary discharge planning process, led by the attending physician.  Recommendations may be updated based on patient status, additional functional criteria and insurance authorization.   Assistance Recommended at Discharge PRN  Patient can return home with the following Assist for transportation    Functional Status Assessment  Patient has not had a recent decline in their functional status  Equipment Recommendations  None recommended by OT    Recommendations for Other Services       Precautions / Restrictions Precautions Precautions: Fall Precaution Comments: low fall risk Restrictions Weight Bearing Restrictions: No      Mobility Bed Mobility Overal bed mobility: Modified Independent                  Transfers Overall transfer level: Independent Equipment used: None                      Balance Overall balance assessment: No apparent balance deficits (not formally assessed)                                          ADL either performed or assessed with clinical judgement   ADL Overall ADL's : Independent                                       General ADL Comments: able to mobilize to/from bathroom without AD, manage toileting task and hand hygiene at sink. pt reports plan to take shower (educated on need for orders, IV covering, etc). Focused on energy conservation education w/ emphasis on pacing self, breathing techniques, seated rest breaks and task. modification. educated on obtaining pulse ox with pt inquiring if medicaid would cover it - Notified CM/SW     Vision Baseline Vision/History: 1 Wears glasses Ability to See in Adequate Light: 0 Adequate Patient Visual Report: No change from baseline Vision Assessment?: No apparent visual deficits     Perception     Praxis      Pertinent Vitals/Pain Pain Assessment Pain Assessment: Faces Faces Pain Scale: Hurts a little bit Pain Location: generalized; chest Pain Descriptors / Indicators: Grimacing Pain Intervention(s): Monitored during session     Hand Dominance Right   Extremity/Trunk Assessment Upper Extremity Assessment Upper Extremity Assessment: Overall WFL for tasks assessed   Lower Extremity Assessment Lower Extremity Assessment: Defer to PT evaluation   Cervical / Trunk Assessment Cervical / Trunk  Assessment: Normal   Communication Communication Communication: No difficulties   Cognition Arousal/Alertness: Awake/alert Behavior During Therapy: WFL for tasks assessed/performed, Anxious Overall Cognitive Status: Within Functional Limits for tasks assessed                                 General Comments: very pleasant though appears anxious and hyperverbose throughout. some health literacy learning opportunities noted     General Comments  SpO2 98% on 2 L O2, HR up to 108 with activity. per chart, 2 L O2 was applied for comfort; was not hypoxic on  admission    Exercises     Shoulder Instructions      Home Living Family/patient expects to be discharged to:: Private residence Living Arrangements: Spouse/significant other Available Help at Discharge: Family;Friend(s);Available PRN/intermittently Type of Home: Apartment Home Access: Stairs to enter Entrance Stairs-Number of Steps: 2   Home Layout: Two level;Bed/bath upstairs     Bathroom Shower/Tub: Chief Strategy Officer: Standard     Home Equipment: TEFL teacher Comments: lives with boyfriend who has had recent leg amputation - he stays downstairs in the living room; he has multiple DME and has nurses come in to assist though pt still providing a lot of assistance      Prior Functioning/Environment Prior Level of Function : Independent/Modified Independent               ADLs Comments: does not drive, friends take pt to stores. still grocery shopping, Independent with ADLs/IADLs. takes care of boyfriend who had a recent leg amputation - often going up/down stairs when assisting boyfriend (going upstairs to empty urinal in bathroom, etc)        OT Problem List: Cardiopulmonary status limiting activity      OT Treatment/Interventions:      OT Goals(Current goals can be found in the care plan section) Acute Rehab OT Goals Patient Stated Goal: resolve SOB OT Goal Formulation: All assessment and education complete, DC therapy  OT Frequency:      Co-evaluation              AM-PAC OT "6 Clicks" Daily Activity     Outcome Measure Help from another person eating meals?: None Help from another person taking care of personal grooming?: None Help from another person toileting, which includes using toliet, bedpan, or urinal?: None Help from another person bathing (including washing, rinsing, drying)?: None Help from another person to put on and taking off regular upper body clothing?: None Help from another person to put on and taking  off regular lower body clothing?: None 6 Click Score: 24   End of Session Equipment Utilized During Treatment: Oxygen Nurse Communication: Mobility status  Activity Tolerance: Patient tolerated treatment well Patient left: in bed;with call bell/phone within reach;with nursing/sitter in room;Other (comment) (PT)  OT Visit Diagnosis: Other (comment) (decreased cardiopulmonary tolerance)                Time: 1336-1401 OT Time Calculation (min): 25 min Charges:  OT General Charges $OT Visit: 1 Visit OT Evaluation $OT Eval Low Complexity: 1 Low  Mariah Foster, OTR/L Acute Rehab Services Office: [phone]   Mariah Foster 04/01/2023, 2:19 PM

## 2023-04-01 NOTE — H&P (Addendum)
History and Physical    Patient: Mariah Foster MVH:[id number] DOB: 07/10/1961 DOA: 03/31/2023 DOS: the patient was seen and examined on 04/01/2023 PCP: Woodfin Ganja, MD  Patient coming from: Home  Chief Complaint:  Chief Complaint  Patient presents with   Shortness of Breath   HPI: Mariah Foster is a 62 y.o. female with medical history significant of hypertension, hyperlipidemia, COPD,  hypothyroidism, and brief history of tobacco use who presents with complaints of shortness of breath.  At baseline  sshe is not on oxygen.  Patient reports that she had been feeling more short of breath over the last week.  She has had a productive cough with whitish sputum.  Patient reported associated symptoms chills prior to coming in, cramping on the sides of her chest, and inability to sleep..  Denies having any fever, lower extremity swelling, or recent sick contacts.  Patient had tried using her inhaler and nebulizer machine least twice a day without improvement in symptoms.  Yesterday, her breathing worsened to the point which she could not walk from her house to her car without feeling completely winded.   The emergency department patient was noted to be afebrile with tachycardia, tachypnea, ablood pressure elevated up to 202/152, and O2 saturation currently maintained on 3-4 L of nasal cannula oxygen for comfort.  Labs significant for potassium 3, calcium 8.4, BNP 14.8, high-sensitivity troponin negative x 2.  Venous blood gas did not note any significant signs of hypercapnia.  Chest x-ray showed no acute abnormality.  Patient has been given Solu-Medrol 125 mg IV x 1 dose, potassium chloride 40 mEq p.o., and magnesium sulfate 2 g IV.   Review of Systems: As mentioned in the history of present illness. All other systems reviewed and are negative. Past Medical History:  Diagnosis Date   COPD (chronic obstructive pulmonary disease) (HCC)    GI bleed    Hyperlipidemia    Hypertension    Tubular  adenoma of colon 2013   Past Surgical History:  Procedure Laterality Date   CESAREAN SECTION  8413,2440   x 2   Social History:  reports that she has quit smoking. Her smoking use included cigarettes. She smoked an average of .2 packs per day. She has never used smokeless tobacco. She reports current alcohol use. She reports that she does not use drugs.  Allergies  Allergen Reactions   Flagyl [Metronidazole]    Metronidazole Hcl Hives    Stomach cramps    Family History  Problem Relation Age of Onset   Heart attack Father    Diabetes Sister        x 2   Diabetes Brother     Prior to Admission medications   Medication Sig Start Date End Date Taking? Authorizing Provider  acetaminophen (TYLENOL) 500 MG tablet Take 1 tablet (500 mg total) by mouth every 6 (six) hours as needed. 07/01/19   Dahlia Byes A, NP  albuterol (PROVENTIL HFA;VENTOLIN HFA) 108 (90 Base) MCG/ACT inhaler Inhale 1-2 puffs into the lungs every 6 (six) hours as needed for wheezing or shortness of breath.    [provider]  albuterol (PROVENTIL) (2.5 MG/3ML) 0.083% nebulizer solution Take 3 mLs (2.5 mg total) by nebulization 3 (three) times daily as needed for wheezing or shortness of breath. 09/10/22   Pricilla Loveless, MD  amLODipine (NORVASC) 5 MG tablet Take 1 tablet (5 mg total) by mouth daily. 09/10/22   Pricilla Loveless, MD  atorvastatin (LIPITOR) 40 MG tablet Take 1 tablet (  40 mg total) by mouth daily. 02/27/23   Meriam Sprague, MD  azithromycin (ZITHROMAX) 250 MG tablet Take 1 tablet (250 mg total) by mouth daily for 5 days. Take 1 every day until finished. 03/31/23 04/05/23  Dorthy Cooler, PA-C  baclofen (LIORESAL) 10 MG tablet Take 10 mg by mouth 2 (two) times daily as needed for muscle spasms. 09/15/21   [provider]  Budeson-Glycopyrrol-Formoterol (BREZTRI AEROSPHERE) 160-9-4.8 MCG/ACT AERO Inhale 2 puffs into the lungs in the morning and at bedtime. 01/30/23   Martina Sinner, MD   Budeson-Glycopyrrol-Formoterol (BREZTRI AEROSPHERE) 160-9-4.8 MCG/ACT AERO Inhale 2 puffs into the lungs in the morning and at bedtime. 01/30/23   Martina Sinner, MD  cyclobenzaprine (FLEXERIL) 5 MG tablet Take 1 tablet (5 mg total) by mouth 2 (two) times daily as needed for muscle spasms. 01/14/21   Bing Neighbors, NP  dicyclomine (BENTYL) 20 MG tablet Take 1 tablet (20 mg total) by mouth 3 (three) times daily before meals. 11/30/22   Kommor, Madison, MD  DULoxetine (CYMBALTA) 30 MG capsule Take 30 mg by mouth 2 (two) times daily. 08/29/21   [provider]  Ferrous Sulfate (IRON) 325 (65 Fe) MG TABS Take 1 tablet (325 mg total) by mouth 2 (two) times daily. Patient taking differently: Take 325 mg by mouth daily. 10/15/17   Meryl Dare, MD  fluticasone (FLONASE) 50 MCG/ACT nasal spray Place 2 sprays into both nostrils daily. 11/06/17   Couture, Cortni S, PA-C  irbesartan (AVAPRO) 300 MG tablet Take 1 tablet (300 mg total) by mouth daily. 01/01/23   Meriam Sprague, MD  levothyroxine (SYNTHROID) 75 MCG tablet Take 75 mcg by mouth daily. 08/29/21   [provider]  lidocaine (LIDODERM) 5 % Place 1 patch onto the skin daily. Remove & Discard patch within 12 hours or as directed by MD 11/30/22   Kommor, Wyn Forster, MD  omeprazole (PRILOSEC) 40 MG capsule TAKE 1 CAPSULE BY MOUTH ONCE DAILY 09/11/18   Unk Lightning, PA  polyethylene glycol (MIRALAX) 17 g packet Take 17 g by mouth daily. 11/30/22   Kommor, Madison, MD  potassium chloride SA (KLOR-CON M) 20 MEQ tablet Take 1 tablet (20 mEq total) by mouth daily. 09/10/22   Pricilla Loveless, MD  predniSONE (DELTASONE) 20 MG tablet Take 2 tablets (40 mg total) by mouth daily for 5 days. 03/31/23 04/05/23  Dorthy Cooler, PA-C  traMADol (ULTRAM) 50 MG tablet Take 50 mg by mouth every 6 (six) hours as needed. 11/29/21   [provider]  traZODone (DESYREL) 50 MG tablet Take 50-100 mg by mouth at bedtime as needed for sleep.  09/27/21   [provider]  potassium chloride (K-DUR) 10 MEQ tablet Take 1 tablet (10 mEq total) by mouth daily. 03/14/17 05/26/19  Maxwell Caul, PA-C    Physical Exam: Vitals:   04/01/23 0445 04/01/23 0459 04/01/23 0528 04/01/23 0804  BP: (!) 147/74  (!) 190/93   Pulse: 79  88 90  Resp: (!) 25  20 (!) 22  Temp:  (!) 97.5 F (36.4 C) 97.7 F (36.5 C)   TempSrc:  Oral Oral   SpO2: 94%  99% 96%  Weight:      Height:         Constitutional: Older adult female who appears to be in some respiratory distress Eyes: PERRL, lids and conjunctivae normal ENMT: Mucous membranes are moist. Posterior pharynx clear of any exudate or lesions.no JVD appreciated. Neck: normal,  supple, no masses, no thyromegaly Respiratory: Decreased overall aeration with expiratory wheezes appreciated.  Patient on 3 L of nasal cannula oxygen with O2 saturations maintained.  Able to talk in complete sentences at this time. Cardiovascular: Regular rate and rhythm, no murmurs / rubs / gallops. No extremity edema. 2+ pedal pulses. No carotid bruits.  Abdomen: no tenderness, no masses palpated. No hepatosplenomegaly. Bowel sounds positive.  Musculoskeletal: no clubbing / cyanosis. No joint deformity upper and lower extremities. Good ROM, no contractures. Normal muscle tone.  Skin: no rashes, lesions, ulcers. No induration Neurologic: CN 2-12 grossly intact. Sensation intact, DTR normal. Strength 5/5 in all 4.  Psychiatric: Normal judgment and insight. Alert and oriented x 3. Normal mood.   Data Reviewed:   EKG reveals sinus rhythm 83 bpm with QTc 473.  Reviewed labs, imaging, and pertinent records as noted above in HPI.  Assessment and Plan:  Acute respiratory distress COPD exacerbation Acute.  Patient presents with complaints of progressive worsening shortness of breath with productive cough.  On physical exam patient with decreased overall aeration expiratory wheezes appreciated.  Patient was not  noted to be hypoxic, but placed nasal cannula oxygen for comfort.  Chest x-ray clear.  Patient had been treated with breathing treatments IV, Solu-Medrol IV -Admitted to telemetry bed -Incentive spirometry -Continuous pulse oximetry with oxygen maintain O2 saturation greater than 90%.  Orders placed to check O2 saturations with ambulation. -DuoNebs 4 times daily and albuterol as needed -Brovana and budesonide nebs twice daily -Azithromycin -Solu-Medrol IV and then transition to prednisone p.o. in a.m. -Mucinex -PT/OT to evaluate and treat  Hypertensive urgency On admission systolic blood pressures noted to be elevated up to 202. -Continue home blood pressure regimen -Hydralazine IV as needed  Hypokalemia Hypocalcemia Acute.  On admission potassium 3 and calcium 8.4 with ionized calcium 1.13.  Patient had been given potassium chloride 40 mEq p.o. Suspect patient's reports of pains on the sides of her chest likely related to electrolyte abnormalities and less likely cardiac in nature as high-sensitivity troponins negative x 2. -Give calcium gluconate 1 g IV -Give potassium chloride 20 meq p.o. -Continue to monitor and replace as needed  Hyperlipidemia -Continue atorvastatin  Hypothyroidism -Check TSH -Continue levothyroxine  DVT prophylaxis: Lovenox  Advance Care Planning:   Code Status: Full Code   Consults: none  Family Communication: Unable to reach patient's son or daughter on the phone after 2 attempts.  Patient's son without voicemail.  Severity of Illness: The appropriate patient status for this patient is INPATIENT. Inpatient status is judged to be reasonable and necessary in order to provide the required intensity of service to ensure the patient's safety. The patient's presenting symptoms, physical exam findings, and initial radiographic and laboratory data in the context of their chronic comorbidities is felt to place them at high risk for further clinical  deterioration. Furthermore, it is not anticipated that the patient will be medically stable for discharge from the hospital within 2 midnights of admission.   * I certify that at the point of admission it is my clinical judgment that the patient will require inpatient hospital care spanning beyond 2 midnights from the point of admission due to high intensity of service, high risk for further deterioration and high frequency of surveillance required.*  Author: Clydie Braun, MD 04/01/2023 8:28 AM  For on call review [email or web address].

## 2023-04-01 NOTE — Evaluation (Signed)
Physical Therapy Evaluation Patient Details Name: Mariah Foster MRN: [id number] DOB: Oct 16, 1961 Today's Date: 04/01/2023  History of Present Illness  Pt is a 62 y/o female admitted with acute COPD exacerbation. Pt initially presented to ED 6/2, received treatment with improvements and discharged though returned to hospital when SOB occurred on the way home. PMH: COPD, HTN, HLD  Clinical Impression  Patient received sitting on side of bed with OT present. Patient is agreeable to ambulation in hallway. She is on 3 liters O2 currently. Patient is independent with sit to stand transfers. Ambulated 200 feet with RW and slow cadence, min guard. Cues for safety with AD. She will continue to benefit from skilled PT to improve activity tolerance and increase strength.         Recommendations for follow up therapy are one component of a multi-disciplinary discharge planning process, led by the attending physician.  Recommendations may be updated based on patient status, additional functional criteria and insurance authorization.  Follow Up Recommendations       Assistance Recommended at Discharge None  Patient can return home with the following  Assist for transportation    Equipment Recommendations None recommended by PT  Recommendations for Other Services       Functional Status Assessment Patient has had a recent decline in their functional status and demonstrates the ability to make significant improvements in function in a reasonable and predictable amount of time.     Precautions / Restrictions Precautions Precautions: Fall Precaution Comments: low fall risk Restrictions Weight Bearing Restrictions: No      Mobility  Bed Mobility Overal bed mobility: Modified Independent                  Transfers Overall transfer level: Modified independent Equipment used: Rolling walker (2 wheels)                    Ambulation/Gait Ambulation/Gait assistance:  Supervision Gait Distance (Feet): 200 Feet Assistive device: Rolling walker (2 wheels) Gait Pattern/deviations: Step-through pattern, Decreased step length - right, Decreased step length - left, Decreased stride length, Trunk flexed Gait velocity: decr     General Gait Details: cues needed for safe use of AD, patient slightly anxious about getting sob with ambulation, therefore ambulating very slowly  Stairs            Wheelchair Mobility    Modified Rankin (Stroke Patients Only)       Balance Overall balance assessment: No apparent balance deficits (not formally assessed)                                           Pertinent Vitals/Pain Pain Assessment Pain Assessment: Faces Faces Pain Scale: Hurts even more Pain Location: chest when coughing Pain Descriptors / Indicators: Grimacing, Guarding, Discomfort, Sore, Sharp Pain Intervention(s): Monitored during session, Repositioned    Home Living Family/patient expects to be discharged to:: Private residence Living Arrangements: Spouse/significant other Available Help at Discharge: Family;Friend(s);Available PRN/intermittently Type of Home: Apartment Home Access: Stairs to enter   Entrance Stairs-Number of Steps: 2   Home Layout: Two level;Bed/bath upstairs Home Equipment: Shower seat Additional Comments: lives with boyfriend who has had recent leg amputation - he stays downstairs in the living room; he has multiple DME and has nurses come in to assist though pt still providing a lot of assistance    Prior Function  Prior Level of Function : Independent/Modified Independent               ADLs Comments: does not drive, friends take pt to stores. still grocery shopping, Independent with ADLs/IADLs. takes care of boyfriend who had a recent leg amputation - often going up/down stairs when assisting boyfriend (going upstairs to empty urinal in bathroom, etc)     Hand Dominance   Dominant Hand:  Right    Extremity/Trunk Assessment   Upper Extremity Assessment Upper Extremity Assessment: Defer to OT evaluation    Lower Extremity Assessment Lower Extremity Assessment: Generalized weakness    Cervical / Trunk Assessment Cervical / Trunk Assessment: Normal  Communication   Communication: No difficulties  Cognition Arousal/Alertness: Awake/alert Behavior During Therapy: WFL for tasks assessed/performed Overall Cognitive Status: Within Functional Limits for tasks assessed                                 General Comments: very pleasant though appears anxious and hyperverbose throughout. some health literacy learning opportunities noted        General Comments General comments (skin integrity, edema, etc.): SpO2 98% on 2 L O2, HR up to 108 with activity. per chart, 2 L O2 was applied for comfort; was not hypoxic on admission    Exercises     Assessment/Plan    PT Assessment Patient needs continued PT services  PT Problem List Decreased strength;Decreased activity tolerance;Decreased mobility;Decreased knowledge of use of DME;Cardiopulmonary status limiting activity       PT Treatment Interventions DME instruction;Gait training;Stair training;Functional mobility training;Therapeutic activities;Patient/family education;Therapeutic exercise    PT Goals (Current goals can be found in the Care Plan section)  Acute Rehab PT Goals Patient Stated Goal: return home when feeling better PT Goal Formulation: With patient Time For Goal Achievement: 04/15/23 Potential to Achieve Goals: Good    Frequency Min 3X/week     Co-evaluation               AM-PAC PT "6 Clicks" Mobility  Outcome Measure Help needed turning from your back to your side while in a flat bed without using bedrails?: None Help needed moving from lying on your back to sitting on the side of a flat bed without using bedrails?: None Help needed moving to and from a bed to a chair  (including a wheelchair)?: None Help needed standing up from a chair using your arms (e.g., wheelchair or bedside chair)?: None Help needed to walk in hospital room?: None Help needed climbing 3-5 steps with a railing? : A Little 6 Click Score: 23    End of Session Equipment Utilized During Treatment: Oxygen Activity Tolerance: Patient tolerated treatment well Patient left: in bed;with call bell/phone within reach Nurse Communication: Mobility status PT Visit Diagnosis: Muscle weakness (generalized) (M62.81);Other abnormalities of gait and mobility (R26.89);Difficulty in walking, not elsewhere classified (R26.2)    Time: 1610-9604 PT Time Calculation (min) (ACUTE ONLY): 20 min   Charges:   PT Evaluation $PT Eval Moderate Complexity: 1 Mod          Daleen Steinhaus, PT, GCS 04/01/23,2:36 PM

## 2023-04-01 NOTE — ED Provider Notes (Signed)
[place] EMERGENCY DEPARTMENT AT [facility] Provider Note   CSN: [id number] Arrival date & time: 03/31/23  2220     History  Chief Complaint  Patient presents with   Shortness of Breath    Mariah Foster is a 62 y.o. female.  62 year old female with history of COPD, HTN, HLD returns to the ER for worsening SHOB and ongoing CP. Patient speaks in short sentences, states she was given treatments in the ER earlier, felt better and went home. As she was getting back home, her symptoms returned and are worse. Also with mid sternal CP, states this is intermittent and an ongoing problem. Cough productive with white sputum. No lower ext edema. SHOB worse with exertion. Denies fevers, chills, sick contacts. Has not had similar symptoms previously.       Home Medications Prior to Admission medications   Medication Sig Start Date End Date Taking? Authorizing Provider  acetaminophen (TYLENOL) 500 MG tablet Take 1 tablet (500 mg total) by mouth every 6 (six) hours as needed. 07/01/19   Dahlia Byes A, NP  albuterol (PROVENTIL HFA;VENTOLIN HFA) 108 (90 Base) MCG/ACT inhaler Inhale 1-2 puffs into the lungs every 6 (six) hours as needed for wheezing or shortness of breath.    [provider]  albuterol (PROVENTIL) (2.5 MG/3ML) 0.083% nebulizer solution Take 3 mLs (2.5 mg total) by nebulization 3 (three) times daily as needed for wheezing or shortness of breath. 09/10/22   Pricilla Loveless, MD  amLODipine (NORVASC) 5 MG tablet Take 1 tablet (5 mg total) by mouth daily. 09/10/22   Pricilla Loveless, MD  atorvastatin (LIPITOR) 40 MG tablet Take 1 tablet (40 mg total) by mouth daily. 02/27/23   Meriam Sprague, MD  azithromycin (ZITHROMAX) 250 MG tablet Take 1 tablet (250 mg total) by mouth daily for 5 days. Take 1 every day until finished. 03/31/23 04/05/23  Dorthy Cooler, PA-C  baclofen (LIORESAL) 10 MG tablet Take 10 mg by mouth 2 (two) times daily as needed for muscle spasms.  09/15/21   [provider]  Budeson-Glycopyrrol-Formoterol (BREZTRI AEROSPHERE) 160-9-4.8 MCG/ACT AERO Inhale 2 puffs into the lungs in the morning and at bedtime. 01/30/23   Martina Sinner, MD  Budeson-Glycopyrrol-Formoterol (BREZTRI AEROSPHERE) 160-9-4.8 MCG/ACT AERO Inhale 2 puffs into the lungs in the morning and at bedtime. 01/30/23   Martina Sinner, MD  cyclobenzaprine (FLEXERIL) 5 MG tablet Take 1 tablet (5 mg total) by mouth 2 (two) times daily as needed for muscle spasms. 01/14/21   Bing Neighbors, NP  dicyclomine (BENTYL) 20 MG tablet Take 1 tablet (20 mg total) by mouth 3 (three) times daily before meals. 11/30/22   Kommor, Madison, MD  DULoxetine (CYMBALTA) 30 MG capsule Take 30 mg by mouth 2 (two) times daily. 08/29/21   [provider]  Ferrous Sulfate (IRON) 325 (65 Fe) MG TABS Take 1 tablet (325 mg total) by mouth 2 (two) times daily. Patient taking differently: Take 325 mg by mouth daily. 10/15/17   Meryl Dare, MD  fluticasone (FLONASE) 50 MCG/ACT nasal spray Place 2 sprays into both nostrils daily. 11/06/17   Couture, Cortni S, PA-C  irbesartan (AVAPRO) 300 MG tablet Take 1 tablet (300 mg total) by mouth daily. 01/01/23   Meriam Sprague, MD  levothyroxine (SYNTHROID) 75 MCG tablet Take 75 mcg by mouth daily. 08/29/21   [provider]  lidocaine (LIDODERM) 5 % Place 1 patch onto the skin daily. Remove & Discard patch  within 12 hours or as directed by MD 11/30/22   Kommor, Wyn Forster, MD  omeprazole (PRILOSEC) 40 MG capsule TAKE 1 CAPSULE BY MOUTH ONCE DAILY 09/11/18   Unk Lightning, PA  polyethylene glycol (MIRALAX) 17 g packet Take 17 g by mouth daily. 11/30/22   Kommor, Madison, MD  potassium chloride SA (KLOR-CON M) 20 MEQ tablet Take 1 tablet (20 mEq total) by mouth daily. 09/10/22   Pricilla Loveless, MD  predniSONE (DELTASONE) 20 MG tablet Take 2 tablets (40 mg total) by mouth daily for 5 days. 03/31/23 04/05/23  Dorthy Cooler, PA-C   traMADol (ULTRAM) 50 MG tablet Take 50 mg by mouth every 6 (six) hours as needed. 11/29/21   [provider]  traZODone (DESYREL) 50 MG tablet Take 50-100 mg by mouth at bedtime as needed for sleep. 09/27/21   [provider]  potassium chloride (K-DUR) 10 MEQ tablet Take 1 tablet (10 mEq total) by mouth daily. 03/14/17 05/26/19  Maxwell Caul, PA-C      Allergies    Flagyl [metronidazole] and Metronidazole hcl    Review of Systems   Review of Systems Negative except as per HPI Physical Exam Updated Vital Signs BP (!) 172/85   Pulse 82   Temp 98.2 F (36.8 C) (Oral)   Resp (!) 30   Ht 5\' 4"  (1.626 m)   Wt 67.1 kg   SpO2 100%   BMI 25.39 kg/m  Physical Exam Vitals and nursing note reviewed.  Constitutional:      General: She is not in acute distress.    Appearance: She is well-developed. She is ill-appearing. She is not diaphoretic.  HENT:     Head: Normocephalic and atraumatic.  Cardiovascular:     Rate and Rhythm: Normal rate and regular rhythm.  Pulmonary:     Effort: Tachypnea and respiratory distress present.     Breath sounds: Decreased breath sounds present.  Musculoskeletal:     Right lower leg: No tenderness. No edema.     Left lower leg: No tenderness. No edema.  Skin:    General: Skin is warm and dry.     Findings: No erythema or rash.  Neurological:     Mental Status: She is alert and oriented to person, place, and time.  Psychiatric:        Behavior: Behavior normal.     ED Results / Procedures / Treatments   Labs (all labs ordered are listed, but only abnormal results are displayed) Labs Reviewed  BASIC METABOLIC PANEL - Abnormal; Notable for the following components:      Result Value   Potassium 3.0 (*)    Glucose, Bld 111 (*)    Calcium 8.4 (*)    All other components within normal limits  I-STAT VENOUS BLOOD GAS, ED - Abnormal; Notable for the following components:   pCO2, Ven 40.7 (*)    Acid-base deficit 3.0 (*)     Potassium 3.0 (*)    Calcium, Ion 1.13 (*)    All other components within normal limits  CBC WITH DIFFERENTIAL/PLATELET  BRAIN NATRIURETIC PEPTIDE  TROPONIN I (HIGH SENSITIVITY)  TROPONIN I (HIGH SENSITIVITY)    EKG EKG Interpretation  Date/Time:  Monday April 01 2023 00:52:48 EDT Ventricular Rate:  89 PR Interval:  135 QRS Duration: 195 QT Interval:  376 QTC Calculation: 458 R Axis:   84 Text Interpretation: Sinus rhythm Nonspecific intraventricular conduction delay Anteroseptal infarct, age indeterminate Confirmed by Gilda Crease [phone]) on 04/01/2023  1:13:28 AM  Radiology DG Chest Port 1 View  Result Date: 04/01/2023 CLINICAL DATA:  Shortness of breath EXAM: PORTABLE CHEST 1 VIEW COMPARISON:  03/31/2023 FINDINGS: Cardiac and mediastinal contours are within normal limits. No focal pulmonary opacity. No pleural effusion or pneumothorax. No acute osseous abnormality. IMPRESSION: No acute cardiopulmonary process. Electronically Signed   By: Wiliam Ke M.D.   On: 04/01/2023 01:01   CT Angio Chest PE W/Cm &/Or Wo Cm  Result Date: 03/31/2023 CLINICAL DATA:  Pulmonary embolism (PE) suspected, high prob Shortness of breath. EXAM: CT ANGIOGRAPHY CHEST WITH CONTRAST TECHNIQUE: Multidetector CT imaging of the chest was performed using the standard protocol during bolus administration of intravenous contrast. Multiplanar CT image reconstructions and MIPs were obtained to evaluate the vascular anatomy. RADIATION DOSE REDUCTION: This exam was performed according to the departmental dose-optimization program which includes automated exposure control, adjustment of the mA and/or kV according to patient size and/or use of iterative reconstruction technique. CONTRAST:  75mL OMNIPAQUE IOHEXOL 350 MG/ML SOLN COMPARISON:  Radiograph earlier today. Chest CTA 12/15/2021 FINDINGS: Cardiovascular: There are no filling defects within the pulmonary arteries to suggest pulmonary embolus. Breathing motion  artifact at the bases limits detailed assessment. The central pulmonary artery is mildly dilated at 3.2 cm as before. The heart is normal in size. Coronary artery calcifications. No pericardial effusion. Contrast refluxes into the hepatic veins and IVC. Aortic atherosclerosis without aneurysm or acute aortic findings. Mediastinum/Nodes: Reactive appearing bilateral hilar nodes, not enlarged by size criteria. There is no mediastinal adenopathy. Esophagus is patulous. Lungs/Pleura: Apical predominant emphysema. No acute or focal airspace disease. No pleural effusion, pneumothorax or features of pulmonary edema. No suspicious pulmonary nodule. Upper Abdomen: Contrast refluxes into the hepatic veins and IVC. Musculoskeletal: There are no acute or suspicious osseous abnormalities. Review of the MIP images confirms the above findings. IMPRESSION: 1. No pulmonary embolus. 2. Moderate to advanced apical predominant emphysema. 3. Contrast refluxes into the hepatic veins and IVC consistent, suggesting elevated right heart pressures. Aortic Atherosclerosis (ICD10-I70.0) and Emphysema (ICD10-J43.9). Electronically Signed   By: Narda Rutherford M.D.   On: 03/31/2023 20:46   DG Chest Port 1 View  Result Date: 03/31/2023 CLINICAL DATA:  Shortness of breath EXAM: PORTABLE CHEST 1 VIEW COMPARISON:  Chest x-ray 12/15/2021 FINDINGS: The heart size and mediastinal contours are within normal limits. Both lungs are clear. The visualized skeletal structures are unremarkable. IMPRESSION: No active disease. Electronically Signed   By: Darliss Cheney M.D.   On: 03/31/2023 17:49    Procedures Procedures    Medications Ordered in ED Medications  magnesium sulfate IVPB 2 g 50 mL (2 g Intravenous New Bag/Given 04/01/23 0353)  ipratropium-albuterol (DUONEB) 0.5-2.5 (3) MG/3ML nebulizer solution 3 mL (3 mLs Nebulization Given 04/01/23 0052)  methylPREDNISolone sodium succinate (SOLU-MEDROL) 125 mg/2 mL injection 125 mg (125 mg Intravenous  Given 04/01/23 0158)  albuterol (PROVENTIL) (2.5 MG/3ML) 0.083% nebulizer solution 2.5 mg (2.5 mg Nebulization Given 04/01/23 0347)  potassium chloride SA (KLOR-CON M) CR tablet 40 mEq (40 mEq Oral Given 04/01/23 0347)    ED Course/ Medical Decision Making/ A&P                             Medical Decision Making Amount and/or Complexity of Data Reviewed Labs: ordered. Radiology: ordered.  Risk Prescription drug management. Decision regarding hospitalization.   This patient presents to the ED for concern of SHOB, CP, this involves an extensive number  of treatment options, and is a complaint that carries with it a high risk of complications and morbidity.  The differential diagnosis includes but not limited to COPD exacerbation, PE, ACS, hypertensive emergency    Co morbidities that complicate the patient evaluation  HTN, COPD, HLD, former smoker   Additional history obtained:  Additional history obtained from family at bedside who contributes to history as above External records from outside source obtained and reviewed including labs and imaging obtained yesterday at ER visit for same. Given PO prednisone and Zithromax for COPD exacerbation. CT PE study negative for PE.   Lab Tests:  I Ordered, and personally interpreted labs.  The pertinent results include:  BMP with mild hypokalemia at 3.0. BNP WNL. Troponin without significant change at 8. I stat VBG with pCO2 40.7. CBC WNL   Imaging Studies ordered:  I ordered imaging studies including CXR  I independently visualized and interpreted imaging which showed no acute process I agree with the radiologist interpretation   Cardiac Monitoring: / EKG:  The patient was maintained on a cardiac monitor.  I personally viewed and interpreted the cardiac monitored which showed an underlying rhythm of: sinus rhythm, rate 89   Consultations Obtained:  I requested consultation with the ER attending, Dr. Blinda Leatherwood,  and discussed lab and  imaging findings as well as pertinent plan - they recommend: admission for COPD exacerbation  Discussed with Dr. Arlean Hopping with [facility] service who will consult for admission.    Problem List / ED Course / Critical interventions / Medication management  62 year old female returns to the ER shortly after discharge with difficulty breathing. Patient was seen in the ER yesterday evening, her son was driving her home when her symptoms returned and so he brought her back to the ER. Patient was notably hypertensive on arrival to triage at 202/158 and tachypenic with O2 sat 95% on room air. Labs and XR were reassessed and without significant change from her prior labs and imaging. Lung sounds were diminished, she was tripoding, provided with a duoneb with slight improvement symptomatically, BP improved. Discussed with ER attending who has seen the patient and recommend consult for admission. She was provided with additional neb and magnesium.  I ordered medication including solumedrol, duo neb, albuterol magnesium, potassium  for COPD exacerbation, hypokalemia   Reevaluation of the patient after these medicines showed that the patient improved I have reviewed the patients home medicines and have made adjustments as needed   Social Determinants of Health:  Lives with family   Test / Admission - Considered:  Admit for COPD exacerbation           Final Clinical Impression(s) / ED Diagnoses Final diagnoses:  COPD exacerbation (HCC)  Hypokalemia    Rx / DC Orders ED Discharge Orders     None         Jeannie Fend, PA-C 04/01/23 0422    Gilda Crease, MD 04/01/23 [phone]

## 2023-04-01 NOTE — Progress Notes (Signed)
Carryover admission to the Day Admitter.  I discussed this case with the EDP, Army Melia, PA.  Per these discussions:   This is a 62 year old female with history of COPD, who is being admitted with acute COPD exacerbation presenting with 1 to 2 days of progressive shortness of breath.  She presented to the ED earlier in the evening with complaint of shortness of breath, which improved following nebulizer treatment, prompting her to be discharged home.  However, following this, she developed progressive worsening of her shortness of breath, prompting her to present back to [facility] emergency department for further evaluation management thereof.   Afebrile, without objective hypoxia.  Rather, she has been on 2 L nasal cannula for patient comfort.  CTA chest showed no evidence of acute process, including no evidence of pulmonary embolism.   I have placed an order for inpatient admission for further evaluation management of the above.  I have placed some additional preliminary admit orders via the adult multi-morbid admission order set. I have also ordered solumedrol, scheduled duo nebulizer treatments, prn albuterol nebulizer.    Newton Pigg, DO Hospitalist

## 2023-04-01 NOTE — Plan of Care (Signed)

## 2023-04-01 NOTE — Progress Notes (Signed)
New Admission Note:    Arrival Method: ED stretcher Mental Orientation: A/O x4 Telemetry: box 11 Assessment: completed Skin: intact IV: 18 G right AC Pain:N/A Tubes: N/A Safety Measures: non skid socks Admission: completed Orientation: Patient has been oriented to the room, unit and staff.  Family: N/A Belongings: clothing, cell phone, purse  Orders have been reviewed and implemented. Will continue to monitor the patient. Call light has been placed within reach and bed alarm has been activated.   Fabian Sharp BSN, RN-BC Phone number: [phone]  0

## 2023-04-01 NOTE — TOC Initial Note (Signed)
Transition of Care [facility]) - Initial/Assessment Note    Patient Details  Name: Mariah Foster MRN: [id number] Date of Birth: 02-Jan-1961  [facility]) CM/SW Contact:    Tom-Johnson, Hershal Coria, RN Phone Number: 04/01/2023, 1:50 PM  Clinical Narrative:                  CM spoke with patient at bedside about needs for post hospital transition. Admitted with SOB and Productive Cough which progressed over the course of the day. Patient states she used Albuterol inhaler PRN without relief. Currently on 3L O2 acute, does not use home O2. On IV Solu-Medrol, Inhalers and IV abx.  From home with a friend who lives with her. Has two supportive children. Unemployed, on SSI. Does not drive, pay friends to transport to and from appointments.  CM informed patient she can call Medicaid office to schedule transportation for her appointments. Patient requested assistance with paying for her inhalers. Application from [employer] given to patient to fill out for medication assistance. Patient also requested for Pulse Oximeter. [employer] does not carry Pulse Oximeter and Medicaid does not pay for it by [employer].  No PT/OT f/u noted.      Barriers to Discharge: Continued Medical Work up   Patient Goals and CMS Choice Patient states their goals for this hospitalization and ongoing recovery are:: To return home CMS Medicare.gov Compare Post Acute Care list provided to:: Patient        Expected Discharge Plan and Services   Discharge Planning Services: CM Consult   Living arrangements for the past 2 months: Single Family Home                                      Prior Living Arrangements/Services Living arrangements for the past 2 months: Single Family Home   Patient language and need for interpreter reviewed:: Yes Do you feel safe going back to the place where you live?: Yes      Need for Family Participation in Patient Care: Yes (Comment) Care giver support system in place?:  Yes (comment)   Criminal Activity/Legal Involvement Pertinent to Current Situation/Hospitalization: No - Comment as needed  Activities of Daily Living Home Assistive Devices/Equipment: None ADL Screening (condition at time of admission) Patient's cognitive ability adequate to safely complete daily activities?: Yes Is the patient deaf or have difficulty hearing?: No Does the patient have difficulty seeing, even when wearing glasses/contacts?: No Does the patient have difficulty concentrating, remembering, or making decisions?: No Patient able to express need for assistance with ADLs?: Yes Does the patient have difficulty dressing or bathing?: No Independently performs ADLs?: Yes (appropriate for developmental age) Does the patient have difficulty walking or climbing stairs?: Yes (SOB) Weakness of Legs: None Weakness of Arms/Hands: None  Permission Sought/Granted Permission sought to share information with : Case Manager, Family Supports Permission granted to share information with : Yes, Verbal Permission Granted              Emotional Assessment Appearance:: Appears stated age Attitude/Demeanor/Rapport: Engaged, Gracious Affect (typically observed): Accepting, Calm, Hopeful, Pleasant Orientation: : Oriented to Self, Oriented to Place, Oriented to  Time, Oriented to Situation Alcohol / Substance Use: Not Applicable Psych Involvement: No (comment)  Admission diagnosis:  Hypokalemia [E87.6] Acute exacerbation of chronic obstructive pulmonary disease (COPD) (HCC) [J44.1] COPD exacerbation (HCC) [J44.1] Patient Active Problem List   Diagnosis Date Noted  Acute exacerbation of chronic obstructive pulmonary disease (COPD) (HCC) 04/01/2023   HTN (hypertension) 12/29/2012   PCP:  Woodfin Ganja, MD Pharmacy:   [facility] [phone] - [place], [place] - [address] AT [address] [place] [address] Phone: [phone] Fax: [phone]  [facility]  [id number] [place], [place] - [address] AT [facility] OF [facility] & [address] [place] [address] Phone: [phone] Fax: [phone]     Social Determinants of Health (SDOH) Social History: SDOH Screenings   Tobacco Use: Medium Risk (04/01/2023)   SDOH Interventions: Transportation Interventions: Intervention Not Indicated, Inpatient TOC, Patient Resources (Friends/Family)   Readmission Risk Interventions    04/01/2023    1:49 PM  Readmission Risk Prevention Plan  Transportation Screening Complete  PCP or Specialist Appt within 5-7 Days Complete  Home Care Screening Complete  Medication Review (RN CM) Referral to Pharmacy

## 2023-04-01 NOTE — ED Notes (Signed)
Ignore critical result

## 2023-04-01 NOTE — ED Notes (Signed)
ED TO INPATIENT HANDOFF REPORT  ED Nurse Name and Phone #: Grant Fontana PM [phone]  S Name/Age/Gender Mariah Foster 62 y.o. female Room/Bed: 031C/031C  Code Status   Code Status: Full Code  Home/SNF/Other Home Patient oriented to: self, place, time, and situation Is this baseline? Yes   Triage Complete: Triage complete  Chief Complaint Acute exacerbation of chronic obstructive pulmonary disease (COPD) (HCC) [J44.1]  Triage Note The pt reports that she has just been discharged from here and when she stepped outside she had more difficulty breathing   Allergies Allergies  Allergen Reactions   Flagyl [Metronidazole]    Metronidazole Hcl Hives    Stomach cramps    Level of Care/Admitting Diagnosis ED Disposition     ED Disposition  Admit   Condition  --   Comment  Hospital Area: [facility] [[id number]]  Level of Care: Telemetry Medical [104]  May admit patient to [facility] or [facility] if equivalent level of care is available:: No  Covid Evaluation: Asymptomatic - no recent exposure (last 10 days) testing not required  Diagnosis: Acute exacerbation of chronic obstructive pulmonary disease (COPD) (HCC) [454098]  Admitting Physician: Angie Fava [[id number]]  Attending Physician: Angie Fava [[id number]]  Certification:: I certify this patient will need inpatient services for at least 2 midnights  Estimated Length of Stay: 2          B Medical/Surgery History Past Medical History:  Diagnosis Date   COPD (chronic obstructive pulmonary disease) (HCC)    GI bleed    Hyperlipidemia    Hypertension    Tubular adenoma of colon 2013   Past Surgical History:  Procedure Laterality Date   CESAREAN SECTION  [id number]   x 2     A IV Location/Drains/Wounds Patient Lines/Drains/Airways Status     Active Line/Drains/Airways     Name Placement date Placement time Site Days   Peripheral IV 04/01/23 18 G Right Antecubital 04/01/23   0151  Antecubital  less than 1            Intake/Output Last 24 hours No intake or output data in the 24 hours ending 04/01/23 0430  Labs/Imaging Results for orders placed or performed during the hospital encounter of 03/31/23 (from the past 48 hour(s))  Basic metabolic panel     Status: Abnormal   Collection Time: 04/01/23  1:45 AM  Result Value Ref Range   Sodium 141 135 - 145 mmol/L   Potassium 3.0 (L) 3.5 - 5.1 mmol/L   Chloride 107 98 - 111 mmol/L   CO2 23 22 - 32 mmol/L   Glucose, Bld 111 (H) 70 - 99 mg/dL    Comment: Glucose reference range applies only to samples taken after fasting for at least 8 hours.   BUN 17 8 - 23 mg/dL   Creatinine, Ser 9.62 0.44 - 1.00 mg/dL   Calcium 8.4 (L) 8.9 - 10.3 mg/dL   GFR, Estimated >95 >28 mL/min    Comment: (NOTE) Calculated using the CKD-EPI Creatinine Equation (2021)    Anion gap 11 5 - 15    Comment: Performed at [facility], [address], [place], [place] [address]  CBC with Differential     Status: None   Collection Time: 04/01/23  1:45 AM  Result Value Ref Range   WBC 9.7 4.0 - 10.5 K/uL   RBC 4.24 3.87 - 5.11 MIL/uL   Hemoglobin 12.7 12.0 - 15.0 g/dL   HCT 39.0  36.0 - 46.0 %   MCV 92.0 80.0 - 100.0 fL   MCH 30.0 26.0 - 34.0 pg   MCHC 32.6 30.0 - 36.0 g/dL   RDW 16.1 09.6 - 04.5 %   Platelets 308 150 - 400 K/uL   nRBC 0.0 0.0 - 0.2 %   Neutrophils Relative % 62 %   Neutro Abs 6.0 1.7 - 7.7 K/uL   Lymphocytes Relative 27 %   Lymphs Abs 2.6 0.7 - 4.0 K/uL   Monocytes Relative 8 %   Monocytes Absolute 0.8 0.1 - 1.0 K/uL   Eosinophils Relative 3 %   Eosinophils Absolute 0.3 0.0 - 0.5 K/uL   Basophils Relative 0 %   Basophils Absolute 0.0 0.0 - 0.1 K/uL   Immature Granulocytes 0 %   Abs Immature Granulocytes 0.03 0.00 - 0.07 K/uL    Comment: Performed at [facility], [address] [place], [place] [address]  Brain natriuretic peptide     Status: None   Collection Time: 04/01/23  1:45 AM  Result  Value Ref Range   B Natriuretic Peptide 14.8 0.0 - 100.0 pg/mL    Comment: Performed at [facility], [address] [place], [place] [address]  Troponin I (High Sensitivity)     Status: None   Collection Time: 04/01/23  1:45 AM  Result Value Ref Range   Troponin I (High Sensitivity) 7 <18 ng/L    Comment: (NOTE) Elevated high sensitivity troponin I (hsTnI) values and significant  changes across serial measurements may suggest ACS but many other  chronic and acute conditions are known to elevate hsTnI results.  Refer to the "Links" section for chest pain algorithms and additional  guidance. Performed at [facility], [address], [place], [place] [address]   I-Stat venous blood gas, [facility], [facility], [facility])     Status: Abnormal   Collection Time: 04/01/23  1:48 AM  Result Value Ref Range   pH, Ven 7.351 7.25 - 7.43   pCO2, Ven 40.7 (L) 44 - 60 mmHg   pO2, Ven 38 32 - 45 mmHg   Bicarbonate 22.5 20.0 - 28.0 mmol/L   TCO2 24 22 - 32 mmol/L   O2 Saturation 69 %   Acid-base deficit 3.0 (H) 0.0 - 2.0 mmol/L   Sodium 144 135 - 145 mmol/L   Potassium 3.0 (L) 3.5 - 5.1 mmol/L   Calcium, Ion 1.13 (L) 1.15 - 1.40 mmol/L   HCT 38.0 36.0 - 46.0 %   Hemoglobin 12.9 12.0 - 15.0 g/dL   Patient temperature 21.3 C    Sample type VENOUS    Comment NOTIFIED PHYSICIAN   Troponin I (High Sensitivity)     Status: None   Collection Time: 04/01/23  3:12 AM  Result Value Ref Range   Troponin I (High Sensitivity) 8 <18 ng/L    Comment: (NOTE) Elevated high sensitivity troponin I (hsTnI) values and significant  changes across serial measurements may suggest ACS but many other  chronic and acute conditions are known to elevate hsTnI results.  Refer to the "Links" section for chest pain algorithms and additional  guidance. Performed at [facility], [address] [place], [place] [address]    DG Chest Port 1 View  Result Date: 04/01/2023 CLINICAL DATA:  Shortness of breath EXAM:  PORTABLE CHEST 1 VIEW COMPARISON:  03/31/2023 FINDINGS: Cardiac and mediastinal contours are within normal limits. No focal pulmonary opacity. No pleural effusion or pneumothorax. No  acute osseous abnormality. IMPRESSION: No acute cardiopulmonary process. Electronically Signed   By: Wiliam Ke M.D.   On: 04/01/2023 01:01   CT Angio Chest PE W/Cm &/Or Wo Cm  Result Date: 03/31/2023 CLINICAL DATA:  Pulmonary embolism (PE) suspected, high prob Shortness of breath. EXAM: CT ANGIOGRAPHY CHEST WITH CONTRAST TECHNIQUE: Multidetector CT imaging of the chest was performed using the standard protocol during bolus administration of intravenous contrast. Multiplanar CT image reconstructions and MIPs were obtained to evaluate the vascular anatomy. RADIATION DOSE REDUCTION: This exam was performed according to the departmental dose-optimization program which includes automated exposure control, adjustment of the mA and/or kV according to patient size and/or use of iterative reconstruction technique. CONTRAST:  75mL OMNIPAQUE IOHEXOL 350 MG/ML SOLN COMPARISON:  Radiograph earlier today. Chest CTA 12/15/2021 FINDINGS: Cardiovascular: There are no filling defects within the pulmonary arteries to suggest pulmonary embolus. Breathing motion artifact at the bases limits detailed assessment. The central pulmonary artery is mildly dilated at 3.2 cm as before. The heart is normal in size. Coronary artery calcifications. No pericardial effusion. Contrast refluxes into the hepatic veins and IVC. Aortic atherosclerosis without aneurysm or acute aortic findings. Mediastinum/Nodes: Reactive appearing bilateral hilar nodes, not enlarged by size criteria. There is no mediastinal adenopathy. Esophagus is patulous. Lungs/Pleura: Apical predominant emphysema. No acute or focal airspace disease. No pleural effusion, pneumothorax or features of pulmonary edema. No suspicious pulmonary nodule. Upper Abdomen: Contrast refluxes into the hepatic  veins and IVC. Musculoskeletal: There are no acute or suspicious osseous abnormalities. Review of the MIP images confirms the above findings. IMPRESSION: 1. No pulmonary embolus. 2. Moderate to advanced apical predominant emphysema. 3. Contrast refluxes into the hepatic veins and IVC consistent, suggesting elevated right heart pressures. Aortic Atherosclerosis (ICD10-I70.0) and Emphysema (ICD10-J43.9). Electronically Signed   By: Narda Rutherford M.D.   On: 03/31/2023 20:46   DG Chest Port 1 View  Result Date: 03/31/2023 CLINICAL DATA:  Shortness of breath EXAM: PORTABLE CHEST 1 VIEW COMPARISON:  Chest x-ray 12/15/2021 FINDINGS: The heart size and mediastinal contours are within normal limits. Both lungs are clear. The visualized skeletal structures are unremarkable. IMPRESSION: No active disease. Electronically Signed   By: Darliss Cheney M.D.   On: 03/31/2023 17:49    Pending Labs Unresulted Labs (From admission, onward)    None       Vitals/Pain Today's Vitals   04/01/23 0315 04/01/23 0330 04/01/23 0345 04/01/23 0400  BP: (!) 177/94 (!) 159/93 (!) 171/98 (!) 172/85  Pulse: 79 69 69 82  Resp: (!) 32 19 (!) 28 (!) 30  Temp:      TempSrc:      SpO2: 100% 100% 100% 100%  Weight:      Height:      PainSc:        Isolation Precautions No active isolations  Medications Medications  magnesium sulfate IVPB 2 g 50 mL (2 g Intravenous New Bag/Given 04/01/23 0353)  acetaminophen (TYLENOL) tablet 650 mg (has no administration in time range)    Or  acetaminophen (TYLENOL) suppository 650 mg (has no administration in time range)  melatonin tablet 3 mg (has no administration in time range)  ondansetron (ZOFRAN) injection 4 mg (has no administration in time range)  methylPREDNISolone sodium succinate (SOLU-MEDROL) 125 mg/2 mL injection 80 mg (has no administration in time range)  ipratropium-albuterol (DUONEB) 0.5-2.5 (3) MG/3ML nebulizer solution 3 mL (has no administration in time range)   albuterol (PROVENTIL) (2.5 MG/3ML) 0.083% nebulizer solution 2.5 mg (has  no administration in time range)  ipratropium-albuterol (DUONEB) 0.5-2.5 (3) MG/3ML nebulizer solution 3 mL (3 mLs Nebulization Given 04/01/23 0052)  methylPREDNISolone sodium succinate (SOLU-MEDROL) 125 mg/2 mL injection 125 mg (125 mg Intravenous Given 04/01/23 0158)  albuterol (PROVENTIL) (2.5 MG/3ML) 0.083% nebulizer solution 2.5 mg (2.5 mg Nebulization Given 04/01/23 0347)  potassium chloride SA (KLOR-CON M) CR tablet 40 mEq (40 mEq Oral Given 04/01/23 0347)    Mobility walks     Focused Assessments Pulmonary Assessment Handoff:  Lung sounds:   O2 Device: Room Air O2 Flow Rate (L/min): 2 L/min    R Recommendations: See Admitting Provider Note  Report given to:   Additional Notes:

## 2023-04-02 ENCOUNTER — Ambulatory Visit: Payer: Medicaid Other | Admitting: Pulmonary Disease

## 2023-04-02 DIAGNOSIS — J441 Chronic obstructive pulmonary disease with (acute) exacerbation: Secondary | ICD-10-CM | POA: Diagnosis not present

## 2023-04-02 DIAGNOSIS — R0603 Acute respiratory distress: Secondary | ICD-10-CM | POA: Diagnosis not present

## 2023-04-02 DIAGNOSIS — I160 Hypertensive urgency: Secondary | ICD-10-CM | POA: Diagnosis not present

## 2023-04-02 LAB — CBC
HCT: 39 % (ref 36.0–46.0)
Hemoglobin: 13 g/dL (ref 12.0–15.0)
MCH: 30.7 pg (ref 26.0–34.0)
MCHC: 33.3 g/dL (ref 30.0–36.0)
MCV: 92 fL (ref 80.0–100.0)
Platelets: 321 10*3/uL (ref 150–400)
RBC: 4.24 MIL/uL (ref 3.87–5.11)
RDW: 14.2 % (ref 11.5–15.5)
WBC: 17.7 10*3/uL — ABNORMAL HIGH (ref 4.0–10.5)
nRBC: 0 % (ref 0.0–0.2)

## 2023-04-02 LAB — BASIC METABOLIC PANEL
Anion gap: 12 (ref 5–15)
BUN: 24 mg/dL — ABNORMAL HIGH (ref 8–23)
CO2: 23 mmol/L (ref 22–32)
Calcium: 9.7 mg/dL (ref 8.9–10.3)
Chloride: 103 mmol/L (ref 98–111)
Creatinine, Ser: 0.91 mg/dL (ref 0.44–1.00)
GFR, Estimated: >60 mL/min (ref >60)
Glucose, Bld: 151 mg/dL — ABNORMAL HIGH (ref 70–99)
Potassium: 4.4 mmol/L (ref 3.5–5.1)
Sodium: 138 mmol/L (ref 135–145)

## 2023-04-02 LAB — MAGNESIUM: Magnesium: 2.2 mg/dL (ref 1.7–2.4)

## 2023-04-02 LAB — TSH: TSH: 0.017 u[IU]/mL — ABNORMAL LOW (ref 0.350–4.500)

## 2023-04-02 MED ORDER — TRAZODONE HCL 50 MG PO TABS: 50.0000 mg | ORAL_TABLET | Freq: Every evening | ORAL | Status: DC | PRN

## 2023-04-02 MED ORDER — ENSURE ENLIVE PO LIQD
237.0000 mL | Freq: Two times a day (BID) | ORAL | Status: DC
  Administered 2023-04-02 – 2023-04-03 (×2): 237 mL via ORAL

## 2023-04-02 MED ORDER — METHYLPREDNISOLONE SODIUM SUCC 40 MG IJ SOLR
40.0000 mg | Freq: Two times a day (BID) | INTRAMUSCULAR | Status: DC
  Administered 2023-04-02 – 2023-04-03 (×2): 40 mg via INTRAVENOUS
  Filled 2023-04-02 (×2): qty 1

## 2023-04-02 MED ORDER — DULOXETINE HCL 30 MG PO CPEP
30.0000 mg | ORAL_CAPSULE | Freq: Two times a day (BID) | ORAL | Status: DC
  Administered 2023-04-02 – 2023-04-03 (×3): 30 mg via ORAL
  Filled 2023-04-02 (×3): qty 1

## 2023-04-02 MED ORDER — PANTOPRAZOLE SODIUM 40 MG PO TBEC
40.0000 mg | DELAYED_RELEASE_TABLET | Freq: Every day | ORAL | Status: DC
  Administered 2023-04-02 – 2023-04-03 (×2): 40 mg via ORAL
  Filled 2023-04-02 (×2): qty 1

## 2023-04-02 MED ORDER — IPRATROPIUM-ALBUTEROL 0.5-2.5 (3) MG/3ML IN SOLN: 3.0000 mL | Freq: Three times a day (TID) | RESPIRATORY_TRACT | Status: DC

## 2023-04-02 MED ORDER — TRAMADOL HCL 50 MG PO TABS: 50.0000 mg | ORAL_TABLET | Freq: Four times a day (QID) | ORAL | Status: DC | PRN

## 2023-04-02 MED ORDER — IPRATROPIUM-ALBUTEROL 0.5-2.5 (3) MG/3ML IN SOLN
3.0000 mL | Freq: Four times a day (QID) | RESPIRATORY_TRACT | Status: DC
  Administered 2023-04-02 – 2023-04-03 (×3): 3 mL via RESPIRATORY_TRACT
  Filled 2023-04-02 (×4): qty 3

## 2023-04-02 MED ORDER — ADULT MULTIVITAMIN W/MINERALS CH
1.0000 | ORAL_TABLET | Freq: Every day | ORAL | Status: DC
  Administered 2023-04-02 – 2023-04-03 (×2): 1 via ORAL
  Filled 2023-04-02 (×2): qty 1

## 2023-04-02 MED ORDER — POTASSIUM CHLORIDE CRYS ER 20 MEQ PO TBCR
20.0000 meq | EXTENDED_RELEASE_TABLET | Freq: Every day | ORAL | Status: DC
  Administered 2023-04-02 – 2023-04-03 (×2): 20 meq via ORAL
  Filled 2023-04-02 (×2): qty 1

## 2023-04-02 MED ORDER — FERROUS SULFATE 325 (65 FE) MG PO TABS
325.0000 mg | ORAL_TABLET | Freq: Every day | ORAL | Status: DC
  Administered 2023-04-02 – 2023-04-03 (×2): 325 mg via ORAL
  Filled 2023-04-02 (×2): qty 1

## 2023-04-02 MED ORDER — POLYETHYLENE GLYCOL 3350 17 G PO PACK
17.0000 g | PACK | Freq: Every day | ORAL | Status: DC
  Administered 2023-04-03: 17 g via ORAL
  Filled 2023-04-02 (×2): qty 1

## 2023-04-02 NOTE — Progress Notes (Signed)
Initial Nutrition Assessment  DOCUMENTATION CODES:   Not applicable  INTERVENTION:  Continue regular diet as ordered Ensure Enlive po BID, each supplement provides 350 kcal and 20 grams of protein to support increased nutritional needs MVI with minerals daily Reached out to Social Work for outpatient food resources  NUTRITION DIAGNOSIS:   Increased nutrient needs related to chronic illness (COPD) as evidenced by estimated needs.  GOAL:   Patient will meet greater than or equal to 90% of their needs  MONITOR:   PO intake, Supplement acceptance, Labs, Weight trends  REASON FOR ASSESSMENT:   Consult Assessment of nutrition requirement/status  ASSESSMENT:   Pt admitted from home with c/o SOB secondary to COPD exacerbation. PMH significant for HTN, HLD, COPD, hypothyroidism.  Spoke with pt at bedside. She is very pleasant. She reports having a good appetite and eating well during admission.  PTA she was only eating about 1-2 meals daily due to financial and transportation issues. Social work spoke with pt previously on transportation resources but reached out to determine if they can provide additional locations for food pantries/other resources.   Pt denies difficulty with intake 2/2 COPD exacerbation. Encouraged adequate PO intake with emphasis on protein for muscle preservation and to meet increased needs secondary to COPD. She is agreeable to protein supplementation during admission.   Meal completion: 6/3: 75% dinner  Last weight on file 06/02 documented to be 67.1 kg, uncertain whether this is actual versus stated. No significant weight changes noted within the last year.   Medications: ferrous sulfate, protonix, miralax, klor-con, prednisone, IV abx  Labs: BUN 24, ionized Ca 1.13   NUTRITION - FOCUSED PHYSICAL EXAM:  Flowsheet Row Most Recent Value  Orbital Region No depletion  Upper Arm Region Mild depletion  Thoracic and Lumbar Region No depletion  Buccal  Region No depletion  Temple Region Mild depletion  Clavicle Bone Region No depletion  Clavicle and Acromion Bone Region No depletion  Scapular Bone Region No depletion  Dorsal Hand Moderate depletion  Patellar Region Severe depletion  Anterior Thigh Region Severe depletion  Posterior Calf Region Severe depletion  Edema (RD Assessment) None  Hair Reviewed  Eyes Reviewed  Mouth Reviewed  Skin Reviewed  Nails Reviewed       Diet Order:   Diet Order             Diet regular Room service appropriate? Yes; Fluid consistency: Thin  Diet effective now                   EDUCATION NEEDS:   Education needs have been addressed  Skin:  Skin Assessment: Reviewed RN Assessment  Last BM:  6/3 (type 2 large)  Height:   Ht Readings from Last 1 Encounters:  04/01/23 5\' 4"  (1.626 m)    Weight:   Wt Readings from Last 1 Encounters:  03/31/23 67.1 kg   BMI:  Body mass index is 25.39 kg/m.  Estimated Nutritional Needs:   Kcal:  1700-1900  Protein:  85-100g  Fluid:  >/=1.7L  Drusilla Kanner, RDN, LDN Clinical Nutrition

## 2023-04-02 NOTE — Progress Notes (Signed)
PROGRESS NOTE    Mariah Foster  ZOX:[id number] DOB: 1961/10/06 DOA: 03/31/2023 PCP: Woodfin Ganja, MD    Brief Narrative:  Mariah Foster is a 62 y.o. female with past medical history significant of hypertension, hyperlipidemia, COPD,  hypothyroidism, presented to the hospital with shortness of breath s for 1 week with productive sputum, chills with discomfort in the chest.  Had been using inhalers and nebulizer twice a day without improvement of symptoms  so he presented to the ED.  In the ED, patient was noted to be  afebrile with tachycardia, tachypnea, blood pressure elevated up to 202/152, and needed supplemental oxygen.  Initial labs significant for potassium 3.0, calcium 8.4, BNP 14.8, high-sensitivity troponin negative x 2.  Venous blood gas did not note any significant signs of hypercapnia.  Chest x-ray showed no acute abnormality.  Patient was given Solu-Medrol 125 mg IV x 1 dose, potassium chloride 40 mEq p.o., and magnesium sulfate 2 g IV and was considered for admission to the hospital for further evaluation and treatment.  Assessment and Plan:   Acute respiratory distress secondary to COPD exacerbation Chest x-ray without infiltrate.  Continue oxygen, nebulizers.  Change to IV Solu-Medrol from prednisone today.  Continue Brovana and budesonide nebs twice daily.  Continue azithromycin empirically for possible underlying bronchitis..    Patient is on 3 L of oxygen by nasal cannula today.  Normally not on supplemental oxygen at home.  Feels very symptomatic with cough wheezing dyspnea and poor exercise tolerance.  Will get PT OT evaluation.  Leukocytosis noted likely reactive.   Hypertensive urgency Initial SBP up to 202.  As needed hydralazine.  Patient is on a amlodipine, avapro.  Has been resumed.  Will continue to monitor.   Hypokalemia Improved after replacement.  Latest potassium of 4.4.  Hypocalcemia Acute.  Ionized calcium was low.  Received calcium gluconate and  potassium.  Latest calcium level of 9.7 and has improved  Hyperlipidemia Continue Lipitor.   Hypothyroidism TSH low at 0.017. Highly suppressed.  On Synthroid 75 mcg at home.  Will decrease the dose to 50 micrograms for now.  Might need to recheck TSH in 4 to 6 weeks.     DVT prophylaxis: enoxaparin (LOVENOX) injection 40 mg Start: 04/01/23 1000 SCDs Start: 04/01/23 0424   Code Status:     Code Status: Full Code  Disposition: Home likely in 1 to 2 days when clinically improved.  Status is: Inpatient Remains inpatient appropriate because: COPD exacerbation, very symptomatic, pending clinical improvement   Family Communication: None at bedside  Consultants:  None  Procedures:  None  Antimicrobials:  Azithromycin  Anti-infectives (From admission, onward)    Start     Dose/Rate Route Frequency Ordered Stop   04/01/23 1500  azithromycin (ZITHROMAX) 500 mg in sodium chloride 0.9 % 250 mL IVPB        500 mg 250 mL/hr over 60 Minutes Intravenous Every 24 hours 04/01/23 1326        Subjective: Today, patient was seen and examined at bedside.  Patient still complains of cough and dyspnea fatigue and poor exercise tolerance.  Has been needing supplemental oxygen at 3 L/min.  Objective: Vitals:   04/02/23 0512 04/02/23 0843 04/02/23 0849 04/02/23 0923  BP: (!) 156/89   (!) 149/79  Pulse: 92 76  90  Resp: 16 18  18   Temp: 98 F (36.7 C)   (!) 97.5 F (36.4 C)  TempSrc: Oral   Oral  SpO2: 98% 96% 96%  97%  Weight:      Height:        Intake/Output Summary (Last 24 hours) at 04/02/2023 1121 Last data filed at 04/02/2023 0400 Gross per 24 hour  Intake 400.71 ml  Output 650 ml  Net -249.29 ml   Filed Weights   03/31/23 2242  Weight: 67.1 kg    Physical Examination: Body mass index is 25.39 kg/m.  General:  Average built, not in obvious distress, on nasal oxygen, appears dyspneic. HENT:   No scleral pallor or icterus noted. Oral mucosa is moist.  Chest:    Diminished breath sounds bilaterally.  Coarse breath sounds noted with mild wheezes.  Tachypneic CVS: S1 &S2 heard. No murmur.  Regular rate and rhythm. Abdomen: Soft, nontender, nondistended.  Bowel sounds are heard.   Extremities: No cyanosis, clubbing or edema.  Peripheral pulses are palpable. Psych: Alert, awake and oriented, normal mood CNS:  No cranial nerve deficits.  Power equal in all extremities.   Skin: Warm and dry.  No rashes noted.  Data Reviewed:   CBC: Recent Labs  Lab 03/31/23 1748 03/31/23 1936 04/01/23 0145 04/01/23 0148 04/02/23 0342  WBC 9.4  --  9.7  --  17.7*  NEUTROABS 5.9  --  6.0  --   --   HGB 12.9 10.2* 12.7 12.9 13.0  HCT 40.0 30.0* 39.0 38.0 39.0  MCV 92.6  --  92.0  --  92.0  PLT 300  --  308  --  321    Basic Metabolic Panel: Recent Labs  Lab 03/31/23 1748 03/31/23 1936 04/01/23 0145 04/01/23 0148 04/02/23 0342  NA 142 145 141 144 138  K 3.0* 3.4* 3.0* 3.0* 4.4  CL 108  --  107  --  103  CO2 24  --  23  --  23  GLUCOSE 106*  --  111*  --  151*  BUN 25*  --  17  --  24*  CREATININE 1.02*  --  0.82  --  0.91  CALCIUM 8.8*  --  8.4*  --  9.7  MG  --   --   --   --  2.2    Liver Function Tests: No results for input(s): "AST", "ALT", "ALKPHOS", "BILITOT", "PROT", "ALBUMIN" in the last 168 hours.   Radiology Studies: DG Chest Port 1 View  Result Date: 04/01/2023 CLINICAL DATA:  Shortness of breath EXAM: PORTABLE CHEST 1 VIEW COMPARISON:  03/31/2023 FINDINGS: Cardiac and mediastinal contours are within normal limits. No focal pulmonary opacity. No pleural effusion or pneumothorax. No acute osseous abnormality. IMPRESSION: No acute cardiopulmonary process. Electronically Signed   By: Wiliam Ke M.D.   On: 04/01/2023 01:01   CT Angio Chest PE W/Cm &/Or Wo Cm  Result Date: 03/31/2023 CLINICAL DATA:  Pulmonary embolism (PE) suspected, high prob Shortness of breath. EXAM: CT ANGIOGRAPHY CHEST WITH CONTRAST TECHNIQUE: Multidetector CT  imaging of the chest was performed using the standard protocol during bolus administration of intravenous contrast. Multiplanar CT image reconstructions and MIPs were obtained to evaluate the vascular anatomy. RADIATION DOSE REDUCTION: This exam was performed according to the departmental dose-optimization program which includes automated exposure control, adjustment of the mA and/or kV according to patient size and/or use of iterative reconstruction technique. CONTRAST:  75mL OMNIPAQUE IOHEXOL 350 MG/ML SOLN COMPARISON:  Radiograph earlier today. Chest CTA 12/15/2021 FINDINGS: Cardiovascular: There are no filling defects within the pulmonary arteries to suggest pulmonary embolus. Breathing motion artifact at the bases limits detailed assessment.  The central pulmonary artery is mildly dilated at 3.2 cm as before. The heart is normal in size. Coronary artery calcifications. No pericardial effusion. Contrast refluxes into the hepatic veins and IVC. Aortic atherosclerosis without aneurysm or acute aortic findings. Mediastinum/Nodes: Reactive appearing bilateral hilar nodes, not enlarged by size criteria. There is no mediastinal adenopathy. Esophagus is patulous. Lungs/Pleura: Apical predominant emphysema. No acute or focal airspace disease. No pleural effusion, pneumothorax or features of pulmonary edema. No suspicious pulmonary nodule. Upper Abdomen: Contrast refluxes into the hepatic veins and IVC. Musculoskeletal: There are no acute or suspicious osseous abnormalities. Review of the MIP images confirms the above findings. IMPRESSION: 1. No pulmonary embolus. 2. Moderate to advanced apical predominant emphysema. 3. Contrast refluxes into the hepatic veins and IVC consistent, suggesting elevated right heart pressures. Aortic Atherosclerosis (ICD10-I70.0) and Emphysema (ICD10-J43.9). Electronically Signed   By: Narda Rutherford M.D.   On: 03/31/2023 20:46   DG Chest Port 1 View  Result Date: 03/31/2023 CLINICAL  DATA:  Shortness of breath EXAM: PORTABLE CHEST 1 VIEW COMPARISON:  Chest x-ray 12/15/2021 FINDINGS: The heart size and mediastinal contours are within normal limits. Both lungs are clear. The visualized skeletal structures are unremarkable. IMPRESSION: No active disease. Electronically Signed   By: Darliss Cheney M.D.   On: 03/31/2023 17:49      LOS: 1 day    Joycelyn Das, MD [facility] Available via Epic secure chat 7am-7pm After these hours, please refer to coverage provider listed on [email or web address] 04/02/2023, 11:21 AM

## 2023-04-02 NOTE — Hospital Course (Signed)
Mariah Foster is a 62 y.o. female with past medical history significant of hypertension, hyperlipidemia, COPD,  hypothyroidism, presented to hospital with shortness of breath s for 1 week with productive sputum and chills with discomfort in the chest.  Had been using inhalers and nebulizer twice a day without improvement of symptoms show patient presented to the ED.  In the ED patient was noted to be  afebrile with tachycardia, tachypnea, blood pressure elevated up to 202/152, and needed supplemental oxygen.  Initial labs significant for potassium 3, calcium 8.4, BNP 14.8, high-sensitivity troponin negative x 2.  Venous blood gas did not note any significant signs of hypercapnia.  Chest x-ray showed no acute abnormality.  Patient was given Solu-Medrol 125 mg IV x 1 dose, potassium chloride 40 mEq p.o., and magnesium sulfate 2 g IV and was considered for admission to the hospital for further evaluation and treatment.  Assessment and Plan:   Acute respiratory distress secondary to COPD exacerbation Chest x-ray without infiltrate.  Continue oxygen nebulizers IV Solu-Medrol.  Patient oral prednisone today.  Brovana and budesonide nebs twice daily.  Continue azithromycin.  PT OT evaluation.   Hypertensive urgency Initial SBP up to 202.  As needed hydralazine.  Patient is on a amlodipine, avapro.   Hypokalemia Improved after replacement.  Latest potassium of 4.4.  Hypocalcemia Acute.  Ionized calcium was low.  Received calcium gluconate potassium.  Latest calcium level of 9.7 and has improved  Hyperlipidemia Continue Lipitor.   Hypothyroidism TSH low at 0.017. Highly suppressed.  On Synthroid 75 mcg at home.  Will decrease the dose to 50 for now.  Might need to recheck TSH in 4 to 6 weeks.

## 2023-04-02 NOTE — TOC Progression Note (Addendum)
Transition of Care [facility]) - Progression Note    Patient Details  Name: Mariah Foster MRN: [id number] Date of Birth: August 05, 1961  Transition of Care [facility]) CM/SW Contact  Tom-Johnson, Hershal Coria, RN Phone Number: 04/02/2023, 10:29 AM  Clinical Narrative:     Patient informed CM that she will be needing assistance with ADL's at home. CM called Medicaid office [phone]) and spoke with Victorino Dike. Patient is placed on a que for a worker to reach out to her for an assessment. CM will continue to follow.   11:30- CM received a call back from [facility] with Medicaid and did her assessment with patient via phone. Patient answered all her questions. Patient also informed Pam about transportation needs, Pam told patient to call [employer] with appointment dates and schedule. CM placed [employer] info on AVS.  Patient also informed Pam about difficulty getting her inhalers filled at the Pharmacy, Pam informed patient to call Pharmacy member services with difficulties.   Pam transferred CM to Allayne Butcher with Pre-authorization dept. Darl Pikes requested a PCS Attestation of Medical Needs form completed and faxed to [phone]. Darl Pikes emailed CM copy of the form. CM notified MD of form and will be completed. CM will continue to follow as patient progresses with care towards discharge.       Barriers to Discharge: Continued Medical Work up  Expected Discharge Plan and Services   Discharge Planning Services: CM Consult   Living arrangements for the past 2 months: Single Family Home                                       Social Determinants of Health (SDOH) Interventions SDOH Screenings   Tobacco Use: Medium Risk (04/01/2023)    Readmission Risk Interventions    04/01/2023    1:49 PM  Readmission Risk Prevention Plan  Transportation Screening Complete  PCP or Specialist Appt within 5-7 Days Complete  Home Care Screening Complete  Medication Review (RN  CM) Referral to Pharmacy

## 2023-04-02 NOTE — Progress Notes (Signed)
Mobility Specialist Progress Note   04/02/23 1101  Mobility  Activity Ambulated with assistance in hallway  Level of Assistance Contact guard assist, steadying assist  Assistive Device Front wheel walker  Distance Ambulated (ft) 240 ft  Activity Response Tolerated well  Mobility Referral Yes  $Mobility charge 1 Mobility  Mobility Specialist Start Time (ACUTE ONLY) 1040  Mobility Specialist Stop Time (ACUTE ONLY) 1100  Mobility Specialist Time Calculation (min) (ACUTE ONLY) 20 min   Pre Mobility: 84 HR, 93% SpO2 on 3LO2 During Mobility: 94 HR, 90% SpO2 on 3LO2 Post Mobility: 89 HR, 92% SpO2 on 3LO2  Received pt in bed on 3LO2 having no complaints and agreeable. Able to mobilize w/ supervision throughout session while maintaining 90% - 93% SpO2 on 3LO2. Pt having mild SOB post mobility but soon subsiding once supine in bed. Call bell placed in reach  Frederico Hamman Mobility Specialist Please contact via SecureChat or  Rehab office at [phone]

## 2023-04-03 DIAGNOSIS — J441 Chronic obstructive pulmonary disease with (acute) exacerbation: Secondary | ICD-10-CM | POA: Diagnosis not present

## 2023-04-03 LAB — CBC
HCT: 37.9 % (ref 36.0–46.0)
Hemoglobin: 12.5 g/dL (ref 12.0–15.0)
MCH: 29.9 pg (ref 26.0–34.0)
MCHC: 33 g/dL (ref 30.0–36.0)
MCV: 90.7 fL (ref 80.0–100.0)
Platelets: 312 10*3/uL (ref 150–400)
RBC: 4.18 MIL/uL (ref 3.87–5.11)
RDW: 13.9 % (ref 11.5–15.5)
WBC: 20.1 10*3/uL — ABNORMAL HIGH (ref 4.0–10.5)
nRBC: 0 % (ref 0.0–0.2)

## 2023-04-03 LAB — BASIC METABOLIC PANEL
Anion gap: 8 (ref 5–15)
BUN: 23 mg/dL (ref 8–23)
CO2: 28 mmol/L (ref 22–32)
Calcium: 9 mg/dL (ref 8.9–10.3)
Chloride: 102 mmol/L (ref 98–111)
Creatinine, Ser: 0.94 mg/dL (ref 0.44–1.00)
GFR, Estimated: >60 mL/min (ref >60)
Glucose, Bld: 137 mg/dL — ABNORMAL HIGH (ref 70–99)
Potassium: 4.4 mmol/L (ref 3.5–5.1)
Sodium: 138 mmol/L (ref 135–145)

## 2023-04-03 LAB — MAGNESIUM: Magnesium: 2.1 mg/dL (ref 1.7–2.4)

## 2023-04-03 MED ORDER — GUAIFENESIN ER 600 MG PO TB12: 600.0000 mg | ORAL_TABLET | Freq: Two times a day (BID) | ORAL | 0 refills | Status: AC

## 2023-04-03 MED ORDER — AZITHROMYCIN 500 MG PO TABS: 500.0000 mg | ORAL_TABLET | Freq: Every day | ORAL | 0 refills | Status: AC

## 2023-04-03 MED ORDER — BREZTRI AEROSPHERE 160-9-4.8 MCG/ACT IN AERO
2.0000 | INHALATION_SPRAY | Freq: Two times a day (BID) | RESPIRATORY_TRACT | 2 refills | Status: DC
Start: 2023-04-03 — End: 2023-05-23

## 2023-04-03 MED ORDER — AZITHROMYCIN 500 MG PO TABS: 500.0000 mg | ORAL_TABLET | Freq: Every day | ORAL | Status: DC

## 2023-04-03 MED ORDER — PREDNISONE 20 MG PO TABS: 40.0000 mg | ORAL_TABLET | Freq: Every day | ORAL | 0 refills | Status: AC

## 2023-04-03 MED ORDER — PREDNISONE 20 MG PO TABS: 40.0000 mg | ORAL_TABLET | Freq: Every day | ORAL | Status: DC

## 2023-04-03 MED ORDER — IPRATROPIUM-ALBUTEROL 0.5-2.5 (3) MG/3ML IN SOLN: 3.0000 mL | Freq: Two times a day (BID) | RESPIRATORY_TRACT | Status: DC

## 2023-04-03 MED ORDER — ENSURE ENLIVE PO LIQD: 237.0000 mL | Freq: Two times a day (BID) | ORAL | Status: DC

## 2023-04-03 MED ORDER — ALBUTEROL SULFATE HFA 108 (90 BASE) MCG/ACT IN AERS: 1.0000 | INHALATION_SPRAY | Freq: Four times a day (QID) | RESPIRATORY_TRACT | 2 refills | Status: DC | PRN

## 2023-04-03 NOTE — Progress Notes (Signed)
Pt has DC order, initially pt wanted to stay over night but MD was informed and decline the request. AVS was given to pt and all her medications were all set-up and was sent to pharmacy of choice. Dan Humphreys was also given to pt. Cab voucher was given by case manager, pt left the floor via wheelchair, on room air, stable with no other complaint.

## 2023-04-03 NOTE — Progress Notes (Signed)
SATURATION QUALIFICATIONS: (This note is used to comply with regulatory documentation for home oxygen)  Patient Saturations on Room Air at Rest = 91%  Patient Saturations on Room Air while Ambulating = 90%  Patient Saturations on supplemental oxygen while Ambulating = Not tested  Please briefly explain why patient needs home oxygen: N/A  Conni Slipper, PT, DPT Acute Rehabilitation Services Secure Chat Preferred Office: [phone]

## 2023-04-03 NOTE — TOC Progression Note (Signed)
Transition of Care [facility]) - Progression Note    Patient Details  Name: Mariah Foster MRN: [id number] Date of Birth: Jul 24, 1961  Transition of Care [facility]) CM/SW Contact  Tom-Johnson, Hershal Coria, RN Phone Number: 04/03/2023, 1:35 PM  Clinical Narrative:     No PT/OT f/u noted. CM will continue to follow as patient progresses with care towards discharge.       Barriers to Discharge: Continued Medical Work up  Expected Discharge Plan and Services   Discharge Planning Services: CM Consult   Living arrangements for the past 2 months: Single Family Home                                       Social Determinants of Health (SDOH) Interventions SDOH Screenings   Tobacco Use: Medium Risk (04/01/2023)    Readmission Risk Interventions    04/01/2023    1:49 PM  Readmission Risk Prevention Plan  Transportation Screening Complete  PCP or Specialist Appt within 5-7 Days Complete  Home Care Screening Complete  Medication Review (RN CM) Referral to Pharmacy

## 2023-04-03 NOTE — Progress Notes (Signed)
Physical Therapy Treatment Patient Details Name: Mariah Foster MRN: [id number] DOB: 1961/09/27 Today's Date: 04/03/2023   History of Present Illness Pt is a 62 y/o female admitted with acute COPD exacerbation. Pt initially presented to ED 6/2, received treatment with improvements and discharged though returned to hospital when SOB occurred on the way home. PMH: COPD, HTN, HLD    PT Comments    Pt progressing towards goals per plan of care. Pt received supine with HOB elevated; nurse removed supplemental O2 prior to therapy. Demonstrated improvements with transfer from EOB without physical assistance and AD. Progressed ambulation distance to 300 ft with one standing rest break due to reported dizziness; mitigated with cues for pursed lip breathing. Progressed to stair training; pt able to navigate steps without physical assistance. O2 monitored throughout session (above 90% throughout activity, end of session 92%). Educated patient on energy conservation techniques with ambulation and stairway navigation. Will continue to benefit from skilled physical therapy focusing on improving functional mobility and endurance.    Recommendations for follow up therapy are one component of a multi-disciplinary discharge planning process, led by the attending physician.  Recommendations may be updated based on patient status, additional functional criteria and insurance authorization.  Follow Up Recommendations       Assistance Recommended at Discharge None  Patient can return home with the following Assist for transportation   Equipment Recommendations  Rollator (4 wheels)    Recommendations for Other Services       Precautions / Restrictions Precautions Precautions: Fall Precaution Comments: low fall risk Restrictions Weight Bearing Restrictions: No     Mobility  Bed Mobility Overal bed mobility: Modified Independent             General bed mobility comments: Able to perform supine to  EOB without physical assistance and HOB elevated.    Transfers Overall transfer level: Modified independent Equipment used: None               General transfer comment: Able to power up with bilateral UE support and no AD. Increased time for sit to stand.    Ambulation/Gait Ambulation/Gait assistance: Supervision Gait Distance (Feet): 300 Feet Assistive device: Rolling walker (2 wheels) Gait Pattern/deviations: Step-through pattern, Decreased step length - right, Decreased step length - left, Decreased stride length Gait velocity: decr Gait velocity interpretation: <1.31 ft/sec, indicative of household ambulator Pre-gait activities: Forward and backward steps General Gait Details: Able to ambulate without use of O2, Verbal cue provided for proximity to RW, upright stance, and step through pattern. Reported dizziness and required one standing break; cued for pursed lip breathing; O2 monitored throughout session (remained above 90% with activity)   Stairs Stairs: Yes Stairs assistance: Min guard Stair Management: One rail Right, One rail Left, Alternating pattern, Forwards Number of Stairs: 3 General stair comments: Pt able to naviate steps with handrail support and no physical assistance. Cued for energy conservation on taking a break between a few steps before proceeding to landing. Only performed 3 steps due to IV line.   Wheelchair Mobility    Modified Rankin (Stroke Patients Only)       Balance Overall balance assessment: No apparent balance deficits (not formally assessed)                                          Cognition Arousal/Alertness: Awake/alert Behavior During Therapy: WFL for tasks assessed/performed  Overall Cognitive Status: Within Functional Limits for tasks assessed                                          Exercises      General Comments        Pertinent Vitals/Pain Pain Assessment Pain Assessment:  No/denies pain    Home Living                          Prior Function            PT Goals (current goals can now be found in the care plan section) Acute Rehab PT Goals Patient Stated Goal: return home when feeling better PT Goal Formulation: With patient Time For Goal Achievement: 04/15/23 Potential to Achieve Goals: Good Progress towards PT goals: Progressing toward goals    Frequency    Min 3X/week      PT Plan Current plan remains appropriate    Co-evaluation              AM-PAC PT "6 Clicks" Mobility   Outcome Measure  Help needed turning from your back to your side while in a flat bed without using bedrails?: None Help needed moving from lying on your back to sitting on the side of a flat bed without using bedrails?: None Help needed moving to and from a bed to a chair (including a wheelchair)?: None Help needed standing up from a chair using your arms (e.g., wheelchair or bedside chair)?: None Help needed to walk in hospital room?: None Help needed climbing 3-5 steps with a railing? : A Little 6 Click Score: 23    End of Session Equipment Utilized During Treatment: Gait belt Activity Tolerance: Patient tolerated treatment well Patient left: in chair;with call bell/phone within reach Nurse Communication: Mobility status PT Visit Diagnosis: Muscle weakness (generalized) (M62.81);Other abnormalities of gait and mobility (R26.89);Difficulty in walking, not elsewhere classified (R26.2)     Time: 6045-4098 PT Time Calculation (min) (ACUTE ONLY): 25 min  Charges:  $Gait Training: 23-37 mins                     Christene Lye, SPT Acute Rehabilitation Services [phone] Secure chat preferred     Christene Lye 04/03/2023, 1:54 PM

## 2023-04-03 NOTE — TOC Transition Note (Signed)
Transition of Care [facility]) - CM/SW Discharge Note   Patient Details  Name: Mariah Foster MRN: [id number] Date of Birth: [birth year]  Transition of Care [facility]) CM/SW Contact:  Tom-Johnson, Hershal Coria, RN Phone Number: 04/03/2023, 4:08 PM   Clinical Narrative:     Patient is scheduled for discharge today.  Readmission Risk Assessment done. Outpatient referral, hospital f/u and discharge instructions on AVS.  Rollator ordered from [employer] and Earna Coder to deliver to patient at bedside prior discharge.   Patient requested for cab at discharge. Therapist, nutritional and Release Of Liability form explained to patient with understanding verbalized, form signed and placed in patient's chart. Cab voucher given to RN. No further TOC needs noted.        Final next level of care: Home/Self Care Barriers to Discharge: Barriers Resolved   Patient Goals and CMS Choice CMS Medicare.gov Compare Post Acute Care list provided to:: Patient    Discharge Placement                  Patient to be transferred to facility by: [facility]      Discharge Plan and Services Additional resources added to the After Visit Summary for     Discharge Planning Services: CM Consult            DME Arranged: Walker rolling with seat DME Agency: [employer] Date DME Agency Contacted: 04/03/23 Time DME Agency Contacted: 1540 Representative spoke with at DME Agency: Earna Coder HH Arranged: NA HH Agency: NA        Social Determinants of Health (SDOH) Interventions SDOH Screenings   Tobacco Use: Medium Risk (04/01/2023)     Readmission Risk Interventions    04/01/2023    1:49 PM  Readmission Risk Prevention Plan  Transportation Screening Complete  PCP or Specialist Appt within 5-7 Days Complete  Home Care Screening Complete  Medication Review (RN CM) Referral to Pharmacy

## 2023-04-03 NOTE — Discharge Summary (Signed)
Physician Discharge Summary   Patient: Mariah Foster MRN: [id number] DOB: 1961/05/13  Admit date:     03/31/2023  Discharge date: 04/03/23  Discharge Physician: Jacquelin Hawking, MD   PCP: Woodfin Ganja, MD   Recommendations at discharge:  Follow-up with PCP in 1 week  Discharge Diagnoses: Principal Problem:   Acute exacerbation of chronic obstructive pulmonary disease (COPD) (HCC) Active Problems:   Acute respiratory distress   Hypertensive urgency   Hypokalemia   Hypocalcemia   Hyperlipidemia   Hypothyroidism  Resolved Problems:   * No resolved hospital problems. *  Hospital Course: Mariah Foster is a 62 y.o. female with a history of hypertension, hyperlipidemia, COPD, hypothyroidism, tobacco use.  Patient presented secondary to shortness of breath and found to have evidence of a COPD exacerbation.  Patient started on steroids, azithromycin, breathing treatments with eventual improvement in symptoms.  Although patient was given supplemental oxygen, patient was not hypoxic.  Patient discharged to continue prednisone burst, and antibiotics.  Patient to follow-up with her primary care physician..  Assessment and Plan:  COPD exacerbation Unclear etiology.  Patient was managed empirically with azithromycin and started on Solu-Medrol and DuoNeb treatment.  Patient with steady improvement of symptoms and functional capacity.  Wheezing improved.  Patient able to ambulate without significant dyspnea prior to discharge.  Patient to continue 5-day steroid course with prednisone as an outpatient in addition to 5-day azithromycin course.  Patient to continue home Breztri and albuterol.  Acute respiratory distress No evidence of hypoxia, although patient was placed on supplemental oxygen.  Symptoms improved with treatment of COPD exacerbation.  Resolved.  Severe asymptomatic hypertension Patient managed with home antihypertensives.  Continue amlodipine and irbesartan on  discharge.  Hypokalemia Resolved with replacement.  Hypocalcemia With replacement.  Hyperlipidemia Continue Lipitor.  Hypothyroidism TSH level at 0.017.  Patient was previously prescribed Synthroid 75 mcg but appears to have been recently reduced to Synthroid 50 mcg.  Continue Synthroid 50 mcg on discharge.   Consultants: None Procedures performed: None Disposition: Home Diet recommendation: Cardiac diet   DISCHARGE MEDICATION: Allergies as of 04/03/2023       Reactions   Flagyl [metronidazole]    unknown   Metronidazole Hcl Hives   Stomach cramps        Medication List     STOP taking these medications    cyclobenzaprine 5 MG tablet Commonly known as: FLEXERIL   dicyclomine 20 MG tablet Commonly known as: BENTYL   lidocaine 5 % Commonly known as: Lidoderm   potassium chloride SA 20 MEQ tablet Commonly known as: KLOR-CON M       TAKE these medications    acetaminophen 500 MG tablet Commonly known as: TYLENOL Take 1 tablet (500 mg total) by mouth every 6 (six) hours as needed.   albuterol (2.5 MG/3ML) 0.083% nebulizer solution Commonly known as: PROVENTIL Take 3 mLs (2.5 mg total) by nebulization 3 (three) times daily as needed for wheezing or shortness of breath.   albuterol 108 (90 Base) MCG/ACT inhaler Commonly known as: VENTOLIN HFA Inhale 1-2 puffs into the lungs every 6 (six) hours as needed for wheezing or shortness of breath.   amLODipine 5 MG tablet Commonly known as: NORVASC Take 1 tablet (5 mg total) by mouth daily.   atorvastatin 40 MG tablet Commonly known as: LIPITOR Take 1 tablet (40 mg total) by mouth daily.   azithromycin 500 MG tablet Commonly known as: ZITHROMAX Take 1 tablet (500 mg total) by mouth daily for 1  day. Start taking on: April 04, 2023 What changed:  medication strength how much to take additional instructions   baclofen 10 MG tablet Commonly known as: LIORESAL Take 10 mg by mouth 2 (two) times daily as  needed for muscle spasms.   Breztri Aerosphere 160-9-4.8 MCG/ACT Aero Generic drug: Budeson-Glycopyrrol-Formoterol Inhale 2 puffs into the lungs in the morning and at bedtime. What changed: Another medication with the same name was removed. Continue taking this medication, and follow the directions you see here.   DULoxetine 30 MG capsule Commonly known as: CYMBALTA Take 30 mg by mouth 2 (two) times daily.   feeding supplement Liqd Take 237 mLs by mouth 2 (two) times daily between meals. Start taking on: April 04, 2023   fluticasone 50 MCG/ACT nasal spray Commonly known as: FLONASE Place 2 sprays into both nostrils daily.   guaiFENesin 600 MG 12 hr tablet Commonly known as: MUCINEX Take 1 tablet (600 mg total) by mouth 2 (two) times daily for 5 days.   irbesartan 300 MG tablet Commonly known as: Avapro Take 1 tablet (300 mg total) by mouth daily.   Iron 325 (65 Fe) MG Tabs Take 1 tablet (325 mg total) by mouth 2 (two) times daily. What changed: when to take this   levothyroxine 50 MCG tablet Commonly known as: SYNTHROID Take 50 mcg by mouth daily before breakfast.   omeprazole 40 MG capsule Commonly known as: PRILOSEC TAKE 1 CAPSULE BY MOUTH ONCE DAILY   polyethylene glycol 17 g packet Commonly known as: MiraLax Take 17 g by mouth daily.   predniSONE 20 MG tablet Commonly known as: DELTASONE Take 2 tablets (40 mg total) by mouth daily with breakfast for 2 days. Start taking on: April 04, 2023 What changed: when to take this        Follow-up Information     [facility] Follow up.   Why: Call to schedule appointments and transportation needs. Contact information: 1 [phone]        Woodfin Ganja, MD. Schedule an appointment as soon as possible for a visit in 1 week(s).   Specialty: Internal Medicine Why: For hospital follow-up Contact information: [address] [place] [address] [phone]                Discharge  Exam: BP (!) 152/81 (BP Location: Left Arm)   Pulse 91   Temp 97.9 F (36.6 C) (Oral)   Resp 19   Ht 5\' 4"  (1.626 m)   Wt 67.1 kg   SpO2 98%   BMI 25.39 kg/m   General exam: Appears calm and comfortable Respiratory system: Clear to auscultation. Respiratory effort normal. Cardiovascular system: S1 & S2 heard, RRR. Gastrointestinal system: Abdomen is nondistended, soft and nontender. Normal bowel sounds heard. Central nervous system: Alert and oriented. No focal neurological deficits. Musculoskeletal: No edema. No calf tenderness Skin: No cyanosis. No rashes Psychiatry: Judgement and insight appear normal. Mood & affect appropriate.   Condition at discharge: stable  The results of significant diagnostics from this hospitalization (including imaging, microbiology, ancillary and laboratory) are listed below for reference.   Imaging Studies: DG Chest Port 1 View  Result Date: 04/01/2023 CLINICAL DATA:  Shortness of breath EXAM: PORTABLE CHEST 1 VIEW COMPARISON:  03/31/2023 FINDINGS: Cardiac and mediastinal contours are within normal limits. No focal pulmonary opacity. No pleural effusion or pneumothorax. No acute osseous abnormality. IMPRESSION: No acute cardiopulmonary process. Electronically Signed   By: Wiliam Ke M.D.   On: 04/01/2023 01:01  CT Angio Chest PE W/Cm &/Or Wo Cm  Result Date: 03/31/2023 CLINICAL DATA:  Pulmonary embolism (PE) suspected, high prob Shortness of breath. EXAM: CT ANGIOGRAPHY CHEST WITH CONTRAST TECHNIQUE: Multidetector CT imaging of the chest was performed using the standard protocol during bolus administration of intravenous contrast. Multiplanar CT image reconstructions and MIPs were obtained to evaluate the vascular anatomy. RADIATION DOSE REDUCTION: This exam was performed according to the departmental dose-optimization program which includes automated exposure control, adjustment of the mA and/or kV according to patient size and/or use of iterative  reconstruction technique. CONTRAST:  75mL OMNIPAQUE IOHEXOL 350 MG/ML SOLN COMPARISON:  Radiograph earlier today. Chest CTA 12/15/2021 FINDINGS: Cardiovascular: There are no filling defects within the pulmonary arteries to suggest pulmonary embolus. Breathing motion artifact at the bases limits detailed assessment. The central pulmonary artery is mildly dilated at 3.2 cm as before. The heart is normal in size. Coronary artery calcifications. No pericardial effusion. Contrast refluxes into the hepatic veins and IVC. Aortic atherosclerosis without aneurysm or acute aortic findings. Mediastinum/Nodes: Reactive appearing bilateral hilar nodes, not enlarged by size criteria. There is no mediastinal adenopathy. Esophagus is patulous. Lungs/Pleura: Apical predominant emphysema. No acute or focal airspace disease. No pleural effusion, pneumothorax or features of pulmonary edema. No suspicious pulmonary nodule. Upper Abdomen: Contrast refluxes into the hepatic veins and IVC. Musculoskeletal: There are no acute or suspicious osseous abnormalities. Review of the MIP images confirms the above findings. IMPRESSION: 1. No pulmonary embolus. 2. Moderate to advanced apical predominant emphysema. 3. Contrast refluxes into the hepatic veins and IVC consistent, suggesting elevated right heart pressures. Aortic Atherosclerosis (ICD10-I70.0) and Emphysema (ICD10-J43.9). Electronically Signed   By: Narda Rutherford M.D.   On: 03/31/2023 20:46   DG Chest Port 1 View  Result Date: 03/31/2023 CLINICAL DATA:  Shortness of breath EXAM: PORTABLE CHEST 1 VIEW COMPARISON:  Chest x-ray 12/15/2021 FINDINGS: The heart size and mediastinal contours are within normal limits. Both lungs are clear. The visualized skeletal structures are unremarkable. IMPRESSION: No active disease. Electronically Signed   By: Darliss Cheney M.D.   On: 03/31/2023 17:49    Microbiology: Results for orders placed or performed during the hospital encounter of 12/15/21   Resp Panel by RT-PCR (Flu A&B, Covid) Nasopharyngeal Swab     Status: None   Collection Time: 12/15/21  3:14 AM   Specimen: Nasopharyngeal Swab; Nasopharyngeal(NP) swabs in vial transport medium  Result Value Ref Range Status   SARS Coronavirus 2 by RT PCR NEGATIVE NEGATIVE Final    Comment: (NOTE) SARS-CoV-2 target nucleic acids are NOT DETECTED.  The SARS-CoV-2 RNA is generally detectable in upper respiratory specimens during the acute phase of infection. The lowest concentration of SARS-CoV-2 viral copies this assay can detect is 138 copies/mL. A negative result does not preclude SARS-Cov-2 infection and should not be used as the sole basis for treatment or other patient management decisions. A negative result may occur with  improper specimen collection/handling, submission of specimen other than nasopharyngeal swab, presence of viral mutation(s) within the areas targeted by this assay, and inadequate number of viral copies(<138 copies/mL). A negative result must be combined with clinical observations, patient history, and epidemiological information. The expected result is Negative.  Fact Sheet for Patients:  BloggerCourse.com  Fact Sheet for Healthcare Providers:  SeriousBroker.it  This test is no t yet approved or cleared by the [place] FDA and  has been authorized for detection and/or diagnosis of SARS-CoV-2 by FDA under an Emergency Use Authorization (EUA). This  EUA will remain  in effect (meaning this test can be used) for the duration of the COVID-19 declaration under Section 564(b)(1) of the Act, 21 U.S.C.section 360bbb-3(b)(1), unless the authorization is terminated  or revoked sooner.       Influenza A by PCR NEGATIVE NEGATIVE Final   Influenza B by PCR NEGATIVE NEGATIVE Final    Comment: (NOTE) The Xpert Xpress SARS-CoV-2/FLU/RSV plus assay is intended as an aid in the diagnosis of influenza from  Nasopharyngeal swab specimens and should not be used as a sole basis for treatment. Nasal washings and aspirates are unacceptable for Xpert Xpress SARS-CoV-2/FLU/RSV testing.  Fact Sheet for Patients: BloggerCourse.com  Fact Sheet for Healthcare Providers: SeriousBroker.it  This test is not yet approved or cleared by the [place] FDA and has been authorized for detection and/or diagnosis of SARS-CoV-2 by FDA under an Emergency Use Authorization (EUA). This EUA will remain in effect (meaning this test can be used) for the duration of the COVID-19 declaration under Section 564(b)(1) of the Act, 21 U.S.C. section 360bbb-3(b)(1), unless the authorization is terminated or revoked.  Performed at [facility], [address], [place], [place] [address]     Labs: CBC: Recent Labs  Lab 03/31/23 1748 03/31/23 1936 04/01/23 0145 04/01/23 0148 04/02/23 0342 04/03/23 0442  WBC 9.4  --  9.7  --  17.7* 20.1*  NEUTROABS 5.9  --  6.0  --   --   --   HGB 12.9 10.2* 12.7 12.9 13.0 12.5  HCT 40.0 30.0* 39.0 38.0 39.0 37.9  MCV 92.6  --  92.0  --  92.0 90.7  PLT 300  --  308  --  321 312   Basic Metabolic Panel: Recent Labs  Lab 03/31/23 1748 03/31/23 1936 04/01/23 0145 04/01/23 0148 04/02/23 0342 04/03/23 0442  NA 142 145 141 144 138 138  K 3.0* 3.4* 3.0* 3.0* 4.4 4.4  CL 108  --  107  --  103 102  CO2 24  --  23  --  23 28  GLUCOSE 106*  --  111*  --  151* 137*  BUN 25*  --  17  --  24* 23  CREATININE 1.02*  --  0.82  --  0.91 0.94  CALCIUM 8.8*  --  8.4*  --  9.7 9.0  MG  --   --   --   --  2.2 2.1    Discharge time spent: 35 minutes.  Signed: Jacquelin Hawking, MD [facility] 04/03/2023

## 2023-04-03 NOTE — Discharge Instructions (Signed)
Mariah Foster,

## 2023-05-23 ENCOUNTER — Ambulatory Visit (INDEPENDENT_AMBULATORY_CARE_PROVIDER_SITE_OTHER): Payer: Medicaid Other | Admitting: Allergy

## 2023-05-23 ENCOUNTER — Encounter: Payer: Self-pay | Admitting: Allergy

## 2023-05-23 VITALS — BP 140/80 | HR 74 | Ht 64.0 in | Wt 138.0 lb

## 2023-05-23 DIAGNOSIS — J310 Chronic rhinitis: Secondary | ICD-10-CM

## 2023-05-23 DIAGNOSIS — H1013 Acute atopic conjunctivitis, bilateral: Secondary | ICD-10-CM | POA: Diagnosis not present

## 2023-05-23 DIAGNOSIS — H109 Unspecified conjunctivitis: Secondary | ICD-10-CM

## 2023-05-23 DIAGNOSIS — J432 Centrilobular emphysema: Secondary | ICD-10-CM

## 2023-05-23 MED ORDER — MONTELUKAST SODIUM 10 MG PO TABS: 10.0000 mg | ORAL_TABLET | Freq: Every day | ORAL | 3 refills | Status: DC

## 2023-05-23 MED ORDER — ALBUTEROL SULFATE HFA 108 (90 BASE) MCG/ACT IN AERS: 2.0000 | INHALATION_SPRAY | Freq: Four times a day (QID) | RESPIRATORY_TRACT | 1 refills | Status: DC | PRN

## 2023-05-23 MED ORDER — FLUTICASONE PROPIONATE HFA 110 MCG/ACT IN AERO: INHALATION_SPRAY | RESPIRATORY_TRACT | 3 refills | Status: DC

## 2023-05-23 MED ORDER — DYMISTA 137-50 MCG/ACT NA SUSP: NASAL | 3 refills | Status: DC

## 2023-05-23 MED ORDER — BREZTRI AEROSPHERE 160-9-4.8 MCG/ACT IN AERO: 2.0000 | INHALATION_SPRAY | Freq: Two times a day (BID) | RESPIRATORY_TRACT | 3 refills | Status: DC

## 2023-05-23 MED ORDER — OLOPATADINE HCL 0.2 % OP SOLN: OPHTHALMIC | 3 refills | Status: DC

## 2023-05-23 NOTE — Patient Instructions (Addendum)
COPD, centrilobular - Daily controller medication(s):  Breztri inhaler 2 puffs twice a day  with spacer device.  - Prior to physical activity: albuterol 2 puffs 10-15 minutes before physical activity. - Rescue medications: albuterol 2-4 puffs every 4-6 hours as needed - Changes during respiratory infections or worsening symptoms: Add on generic Fluticasone to 2 puffs  2-3 times a day  for TWO WEEKS. - Breathing control goals:  * Full participation in all desired activities (may need albuterol before activity) * Albuterol use two time or less a week on average (not counting use with activity) * Cough interfering with sleep two time or less a month * Oral steroids no more than once a year * No hospitalizations  Allergies - will obtain environmental allergy panel - start Singulair 10mg  daily at bedtime.  This is an antileukotriene that can help with both allergy and breathing symptom control.  If you notice any change in mood/behavior/sleep after starting Singulair then stop this medication and let us know.  Symptoms resolve after stopping the medication.  - can use antihistamine like Xyzal 5mg  daily as needed for sneezing, nasal or eye symptoms - can use Dymista 1 spray each nostril twice a day as needed for runny/stuffy nose control - for watery/itchy eyes can use Olopatadine  0.2% 1 drop each eye daily as needed  Follow-up in 2-3 months or sooner if needed

## 2023-05-23 NOTE — Progress Notes (Signed)
New Patient Note  RE: Mariah Foster MRN: [id number] DOB: 05-30-1961 Date of Office Visit: 05/23/2023   Primary care provider: Woodfin Ganja, MD  Chief Complaint: Breathing issues  History of present illness: Mariah Foster is a 62 y.o. female presenting today for evaluation of centrilobular emphysema. She states she has COPD.  She states her PCP told her come seen allergist.  She is not entirely sure why she was referred today.  With her COPD she has had exacerbations requiring hospitalization on 03-31-23.  She states with climbing steps she gets symptomatic.  Pollen also can trigger her breathing symptoms.  She reports her symptoms include cough, wheeze, shortness of breath, chest tightness.  She is currently using a sample of breztri 2 puffs twice a day.   She does describe having a spacer at home however she is not using this with her inhaler.  She has an albuterol inhaler but states she uses it when she is out of the [place].  Before starting Breztri she was on Symbicort.  She has a nebulizer but states it does not seem to work right as she can feel air coming out of the medication chamber when it is on.  She was treated with prednisone and azithromycin course in April. She follows with Dr Francine Graven in pulmonology who she saw most recently on 01/30/2023.  She will be having full PFTs done at her follow-up visit. She quit smoking about 2 years ago.  She has a 45-pack-year history.  She also has secondhand smoke during childhood. She had a chest CT from 11/2021 that shows "moderate to severe upper lobe centrilobular emphysema.  Less pronounced middle and lower lobe emphysema. Large lung volumes.  There is mild dependent retained secretions throughout the trachea,  but the major airways remain patent. There is widespread bronchial wall thickening. No pleural effusion or evidence of acute pulmonary inflammation. No pulmonary nodule."  She does report watery eyes, sneezing, nasal  congestion/drainage at times.  Not currently using any allergy based medications.  She has used Flonase in the past but does not feel it helped much.  Review of systems: 10 pt ROS negative unless noted above in HPI  Past medical history: Past Medical History:  Diagnosis Date   COPD (chronic obstructive pulmonary disease) (HCC)    GI bleed    Hyperlipidemia    Hypertension    Tubular adenoma of colon 2013    Past surgical history: Past Surgical History:  Procedure Laterality Date   CESAREAN SECTION  1025,8527   x 2    Family history:  Family History  Problem Relation Age of Onset   Heart attack Father    Diabetes Sister        x 2   Diabetes Brother     Social history:  Lives in an apartment with gas heating and central cooling.  No pets in the home.  There is no concern for water damage, mildew or roaches in the home.  She does not report an occupation at this time.    Medication List: Current Outpatient Medications  Medication Sig Dispense Refill   acetaminophen (TYLENOL) 500 MG tablet Take 1 tablet (500 mg total) by mouth every 6 (six) hours as needed. 30 tablet 0   albuterol (PROVENTIL) (2.5 MG/3ML) 0.083% nebulizer solution Take 3 mLs (2.5 mg total) by nebulization 3 (three) times daily as needed for wheezing or shortness of breath. 75 mL 0   albuterol (VENTOLIN HFA) 108 (90 Base) MCG/ACT  inhaler Inhale 1-2 puffs into the lungs every 6 (six) hours as needed for wheezing or shortness of breath. 8 g 2   albuterol (VENTOLIN HFA) 108 (90 Base) MCG/ACT inhaler Inhale 2 puffs into the lungs every 6 (six) hours as needed for wheezing or shortness of breath. 18 g 1   amLODipine (NORVASC) 5 MG tablet Take 1 tablet (5 mg total) by mouth daily. 30 tablet 0   atorvastatin (LIPITOR) 40 MG tablet Take 1 tablet (40 mg total) by mouth daily. 90 tablet 3   baclofen (LIORESAL) 10 MG tablet Take 10 mg by mouth 2 (two) times daily as needed for muscle spasms.     DULoxetine (CYMBALTA) 30  MG capsule Take 30 mg by mouth 2 (two) times daily.     DYMISTA 137-50 MCG/ACT SUSP 1 spray each nostril twice a day as needed for runny/stuffy nose control. 23 g 3   feeding supplement (ENSURE ENLIVE / ENSURE PLUS) LIQD Take 237 mLs by mouth 2 (two) times daily between meals. 237 mL    Ferrous Sulfate (IRON) 325 (65 Fe) MG TABS Take 1 tablet (325 mg total) by mouth 2 (two) times daily. (Patient taking differently: Take 325 mg by mouth daily.) 60 each 5   fluticasone (FLOVENT HFA) 110 MCG/ACT inhaler 2 puffs 2-3 times a day for 2 weeks at a time during respiratory infections or worsening symptoms. 12 g 3   irbesartan (AVAPRO) 300 MG tablet Take 1 tablet (300 mg total) by mouth daily. 90 tablet 2   levothyroxine (SYNTHROID) 50 MCG tablet Take 50 mcg by mouth daily before breakfast.     montelukast (SINGULAIR) 10 MG tablet Take 1 tablet (10 mg total) by mouth at bedtime. 30 tablet 3   Olopatadine HCl 0.2 % SOLN 1 drop each eye daily as needed. 2.5 mL 3   Budeson-Glycopyrrol-Formoterol (BREZTRI AEROSPHERE) 160-9-4.8 MCG/ACT AERO Inhale 2 puffs into the lungs in the morning and at bedtime. 10.7 g 3   omeprazole (PRILOSEC) 40 MG capsule TAKE 1 CAPSULE BY MOUTH ONCE DAILY (Patient not taking: Reported on 04/02/2023) 60 capsule 0   polyethylene glycol (MIRALAX) 17 g packet Take 17 g by mouth daily. (Patient not taking: Reported on 04/02/2023) 14 each 0   No current facility-administered medications for this visit.    Known medication allergies: Allergies  Allergen Reactions   Flagyl [Metronidazole]     unknown   Metronidazole Hcl Hives    Stomach cramps     Physical examination: Blood pressure (!) 140/80, pulse 74, height 5\' 4"  (1.626 m), weight 138 lb (62.6 kg), SpO2 97%.  General: Alert, interactive, in no acute distress. HEENT: PERRLA, TMs pearly gray, turbinates minimally edematous without discharge, post-pharynx non erythematous. Neck: Supple without lymphadenopathy. Lungs: Clear to  auscultation without wheezing, rhonchi or rales. {no increased work of breathing. CV: Normal S1, S2 without murmurs. Abdomen: Nondistended, nontender. Skin: Warm and dry, without lesions or rashes. Extremities:  No clubbing, cyanosis or edema. Neuro:   Grossly intact.  Diagnositics/Labs: Spirometry: FEV1: 0.72L 35%, FVC: 1.13L 43% predicted.  Status post DuoNebs she had a 25% increase in FEV1 to 0.9 L or 44% which is a significant response.  She does have severe obstructive pattern  Assessment and plan:   COPD, centrilobular - Daily controller medication(s):  Breztri inhaler 2 puffs twice a day  with spacer device.  - Prior to physical activity: albuterol 2 puffs 10-15 minutes before physical activity. - Rescue medications: albuterol 2-4 puffs every 4-6 hours  as needed - Changes during respiratory infections or worsening symptoms: Add on generic Fluticasone to 2 puffs  2-3 times a day  for TWO WEEKS. - Breathing control goals:  * Full participation in all desired activities (may need albuterol before activity) * Albuterol use two time or less a week on average (not counting use with activity) * Cough interfering with sleep two time or less a month * Oral steroids no more than once a year * No hospitalizations  Rhinoconjunctivitis, presumed allergic - will obtain environmental allergy panel - start Singulair 10mg  daily at bedtime.  This is an antileukotriene that can help with both allergy and breathing symptom control.  If you notice any change in mood/behavior/sleep after starting Singulair then stop this medication and let us know.  Symptoms resolve after stopping the medication.  - can use antihistamine like Xyzal 5mg  daily as needed for sneezing, nasal or eye symptoms - can use Dymista 1 spray each nostril twice a day as needed for runny/stuffy nose control - for watery/itchy eyes can use Olopatadine  0.2% 1 drop each eye daily as needed  Follow-up in 2-3 months or sooner if  needed  I appreciate the opportunity to take part in Athleen's care. Please do not hesitate to contact me with questions.  Sincerely,   Margo Aye, MD Allergy/Immunology [facility]

## 2023-05-24 ENCOUNTER — Telehealth: Payer: Self-pay | Admitting: *Deleted

## 2023-05-24 NOTE — Telephone Encounter (Signed)
Can we please initiate a PA for Breztri? Patient has tried and failed Symbicort, Flovent, and Brovana.

## 2023-05-27 ENCOUNTER — Other Ambulatory Visit (HOSPITAL_COMMUNITY): Payer: Self-pay

## 2023-05-27 NOTE — Telephone Encounter (Signed)
Does patient have a primary insurance? I tried running a test claim but her Medicaid/[employer] is stating to submit to a primary payor. I don't see any additional information on file

## 2023-05-29 ENCOUNTER — Encounter: Payer: Self-pay | Admitting: Pulmonary Disease

## 2023-05-29 ENCOUNTER — Ambulatory Visit: Payer: Medicaid Other | Admitting: Pulmonary Disease

## 2023-05-29 VITALS — BP 116/64 | HR 79 | Ht 64.0 in | Wt 137.0 lb

## 2023-05-29 DIAGNOSIS — J432 Centrilobular emphysema: Secondary | ICD-10-CM | POA: Diagnosis not present

## 2023-05-29 MED ORDER — SPIRIVA RESPIMAT 2.5 MCG/ACT IN AERS: 2.0000 | INHALATION_SPRAY | Freq: Every day | RESPIRATORY_TRACT | 0 refills | Status: DC

## 2023-05-29 MED ORDER — FLUTICASONE-SALMETEROL 230-21 MCG/ACT IN AERO: 2.0000 | INHALATION_SPRAY | Freq: Two times a day (BID) | RESPIRATORY_TRACT | 12 refills | Status: DC

## 2023-05-29 MED ORDER — SPIRIVA RESPIMAT 2.5 MCG/ACT IN AERS
2.0000 | INHALATION_SPRAY | Freq: Every day | RESPIRATORY_TRACT | 11 refills | Status: DC
Start: 2023-05-29 — End: 2023-09-05

## 2023-05-29 NOTE — Progress Notes (Signed)
Synopsis: Referred in April 2024 for COPD by Laurance Flatten, MD  Subjective:   PATIENT ID: Mariah Foster GENDER: female DOB: Apr 16, 1961, MRN: [id number]  HPI  Chief Complaint  Patient presents with   Follow-up    F/U on COPD. States she was in the hospital back in June for COPD exacerbation. Breathing has been so-so since then.    Mariah Foster is a 62 year old woman, former smoker with hypertension who returns to pulmonary clinic for COPD.   Initial OV 01/30/23 She was seen in cardiology clinic 01/01/23, note reviewed. She has been ordered for an echocardiogram.   She has significant cough that bothers her all day and it wakes her up at night. She has wheezing and exertional shortness of breath. She has been using symbicort 160-4.33mcg as needed and as needed albuterol.   She has 45 pack year history. Quit smoking 1-2 years ago. She had second hand smoke in childhood from her father. No FH of lung disease. She worked in Public affairs consultant, currently retired.    Today 05/29/23 She was admitted 6/3 to 6/5 for COPD exacerbation. Her breathing has not been great since. She was seen by allergy immunology 05/23/23 with spirometry which showed FEV1 0.72L (35%) FVC 1.13L (43%) ratio 64. She did have significant bronchodilator response.  She is using albuterol inhaler as needed. She does not have any maintenance inhaler at this time.    Past Medical History:  Diagnosis Date   COPD (chronic obstructive pulmonary disease) (HCC)    GI bleed    Hyperlipidemia    Hypertension    Tubular adenoma of colon 2013     Family History  Problem Relation Age of Onset   Heart attack Father    Diabetes Sister        x 2   Diabetes Brother      Social History   Socioeconomic History   Marital status: Single    Spouse name: Not on file   Number of children: 2   Years of education: Not on file   Highest education level: Not on file  Occupational History   Occupation: cleaning service   Tobacco Use   Smoking status: Former    Current packs/day: 0.20    Types: Cigarettes   Smokeless tobacco: Never  Vaping Use   Vaping status: Never Used  Substance and Sexual Activity   Alcohol use: Yes    Comment: socially   Drug use: No   Sexual activity: Never  Other Topics Concern   Not on file  Social History Narrative   Not on file   Social Determinants of Health   Financial Resource Strain: At Risk (11/30/2022)   Received from [place], [employer]    Financial Resource Strain: 2  Food Insecurity: At Risk (11/30/2022)   Received from [place], [place]   Food Insecurity    Food: 2  Transportation Needs: At Risk (11/30/2022)   Received from [facility], [employer] Needs    Transportation: 2  Physical Activity: Not on File (11/14/2022)   Received from [place], [place]   Physical Activity    Physical Activity: 0  Stress: Not on File (11/14/2022)   Received from [facility], [place]   Stress    Stress: 0  Social Connections: Not on File (11/14/2022)   Received from [place], [place]   Social Connections    Social Connections and Isolation: 0  Intimate Partner Violence: Unknown (01/31/2022)   Received from [place]  [facility]   HITS    Physically Hurt: Not on file    Insult or Talk Down To: Not on file    Threaten Physical Harm: Not on file    Scream or Curse: Not on file     Allergies  Allergen Reactions   Flagyl [Metronidazole]     unknown   Metronidazole Hcl Hives    Stomach cramps     Outpatient Medications Prior to Visit  Medication Sig Dispense Refill   acetaminophen (TYLENOL) 500 MG tablet Take 1 tablet (500 mg total) by mouth every 6 (six) hours as needed. 30 tablet 0   albuterol (PROVENTIL) (2.5 MG/3ML) 0.083% nebulizer solution Take 3 mLs (2.5 mg total) by nebulization 3 (three) times daily as needed for wheezing or shortness of breath. 75 mL 0   albuterol (VENTOLIN HFA) 108 (90 Base) MCG/ACT inhaler Inhale 1-2 puffs into the lungs every 6 (six) hours as  needed for wheezing or shortness of breath. 8 g 2   albuterol (VENTOLIN HFA) 108 (90 Base) MCG/ACT inhaler Inhale 2 puffs into the lungs every 6 (six) hours as needed for wheezing or shortness of breath. 18 g 1   amLODipine (NORVASC) 5 MG tablet Take 1 tablet (5 mg total) by mouth daily. 30 tablet 0   atorvastatin (LIPITOR) 40 MG tablet Take 1 tablet (40 mg total) by mouth daily. 90 tablet 3   baclofen (LIORESAL) 10 MG tablet Take 10 mg by mouth 2 (two) times daily as needed for muscle spasms.     Budeson-Glycopyrrol-Formoterol (BREZTRI AEROSPHERE) 160-9-4.8 MCG/ACT AERO Inhale 2 puffs into the lungs in the morning and at bedtime. 10.7 g 3   DULoxetine (CYMBALTA) 30 MG capsule Take 30 mg by mouth 2 (two) times daily.     DYMISTA 137-50 MCG/ACT SUSP 1 spray each nostril twice a day as needed for runny/stuffy nose control. 23 g 3   feeding supplement (ENSURE ENLIVE / ENSURE PLUS) LIQD Take 237 mLs by mouth 2 (two) times daily between meals. 237 mL    Ferrous Sulfate (IRON) 325 (65 Fe) MG TABS Take 1 tablet (325 mg total) by mouth 2 (two) times daily. (Patient taking differently: Take 325 mg by mouth daily.) 60 each 5   fluticasone (FLOVENT HFA) 110 MCG/ACT inhaler 2 puffs 2-3 times a day for 2 weeks at a time during respiratory infections or worsening symptoms. 12 g 3   irbesartan (AVAPRO) 300 MG tablet Take 1 tablet (300 mg total) by mouth daily. 90 tablet 2   levothyroxine (SYNTHROID) 50 MCG tablet Take 50 mcg by mouth daily before breakfast.     montelukast (SINGULAIR) 10 MG tablet Take 1 tablet (10 mg total) by mouth at bedtime. 30 tablet 3   Olopatadine HCl 0.2 % SOLN 1 drop each eye daily as needed. 2.5 mL 3   omeprazole (PRILOSEC) 40 MG capsule TAKE 1 CAPSULE BY MOUTH ONCE DAILY (Patient not taking: Reported on 04/02/2023) 60 capsule 0   polyethylene glycol (MIRALAX) 17 g packet Take 17 g by mouth daily. (Patient not taking: Reported on 04/02/2023) 14 each 0   No facility-administered medications  prior to visit.    Review of Systems  Constitutional:  Negative for chills, fever, malaise/fatigue and weight loss.  HENT:  Negative for congestion, sinus pain and sore throat.   Eyes: Negative.   Respiratory:  Positive for cough, sputum production and shortness of breath. Negative for hemoptysis and wheezing.   Cardiovascular:  Negative for chest pain, palpitations, orthopnea,  claudication and leg swelling.  Gastrointestinal:  Negative for abdominal pain, heartburn, nausea and vomiting.  Genitourinary: Negative.   Musculoskeletal:  Negative for joint pain and myalgias.  Skin:  Negative for rash.  Neurological:  Positive for headaches. Negative for weakness.  Endo/Heme/Allergies: Negative.   Psychiatric/Behavioral: Negative.     Objective:   Vitals:   05/29/23 1344  BP: 116/64  Pulse: 79  SpO2: 97%  Weight: 137 lb (62.1 kg)  Height: 5\' 4"  (1.626 m)    Physical Exam Constitutional:      General: She is not in acute distress.    Appearance: She is not ill-appearing.  HENT:     Head: Normocephalic and atraumatic.  Eyes:     General: No scleral icterus. Cardiovascular:     Rate and Rhythm: Normal rate and regular rhythm.     Pulses: Normal pulses.     Heart sounds: Normal heart sounds. No murmur heard. Pulmonary:     Effort: Pulmonary effort is normal.     Breath sounds: Decreased air movement present. Wheezing present. No rhonchi or rales.  Musculoskeletal:     Right lower leg: No edema.     Left lower leg: No edema.  Skin:    General: Skin is warm and dry.  Neurological:     General: No focal deficit present.     Mental Status: She is alert.    CBC    Component Value Date/Time   WBC 20.1 (H) 04/03/2023 0442   RBC 4.18 04/03/2023 0442   HGB 12.5 04/03/2023 0442   HCT 37.9 04/03/2023 0442   PLT 312 04/03/2023 0442   MCV 90.7 04/03/2023 0442   MCH 29.9 04/03/2023 0442   MCHC 33.0 04/03/2023 0442   RDW 13.9 04/03/2023 0442   LYMPHSABS 2.6 04/01/2023 0145    MONOABS 0.8 04/01/2023 0145   EOSABS 0.3 04/01/2023 0145   BASOSABS 0.0 04/01/2023 0145      Latest Ref Rng & Units 04/03/2023    4:42 AM 04/02/2023    3:42 AM 04/01/2023    1:48 AM  BMP  Glucose 70 - 99 mg/dL 161  096    BUN 8 - 23 mg/dL 23  24    Creatinine 0.45 - 1.00 mg/dL 4.09  8.11    Sodium 914 - 145 mmol/L 138  138  144   Potassium 3.5 - 5.1 mmol/L 4.4  4.4  3.0   Chloride 98 - 111 mmol/L 102  103    CO2 22 - 32 mmol/L 28  23    Calcium 8.9 - 10.3 mg/dL 9.0  9.7     Chest imaging: CTA Chest 12/15/21 Cardiovascular: Good contrast bolus timing in the pulmonary arterial tree. Mild enlargement of the central pulmonary arteries (series 7, image 188). Normal bilateral pulmonary artery enhancement.   No focal filling defect identified in the pulmonary arteries to suggest acute pulmonary embolism.   Calcified coronary artery atherosclerosis (series 7, image 228). No cardiomegaly or pericardial effusion. Negative visible aorta aside from atherosclerosis.   Mediastinum/Nodes: Negative. No mediastinal mass or lymphadenopathy.   Lungs/Pleura: Moderate to severe upper lobe centrilobular emphysema. Less pronounced middle and lower lobe emphysema. Large lung volumes. There is mild dependent retained secretions throughout the trachea, but the major airways remain patent. There is widespread bronchial wall thickening. No pleural effusion or evidence of acute pulmonary inflammation. No pulmonary nodule.  PFT:     No data to display          Labs:  Path:  Echo: LV EF 60-65%. Grade I diastolic dysfunction. RV systolic function is normal.  Heart Catheterization:    Assessment & Plan:   No diagnosis found.  Discussion: Mariah Foster is a 62 year old woman, former smoker with hypertension who returns to pulmonary clinic for COPD.   She has centrilobular emphysema, greatest in upper lobes. FEV1 35% on recent spirometry at Allergy/Asthma clinic.   We will schedule her for  full PFT evaluation in consideration for bronchoscoping lung volume reduction. We will also schedule her for an ABG. She has completed echo in 01/2023 with normal EF.   We will refer her to pulmonary rehab program.   She is to start spiriva 2.44mcg 2 puffs twice daily and advair HFA 230-69mcg 2 puffs twice daily. She can continue albuterol inhaler 1-2 puffs every 4-6 hours.  Bronchoscopic Lung Volume Reduction Criteria:  - Diagnosis of emphysema confirmed by CT  - BMI <35 kg/m2  - Stable with 20mg  prednisone (or equivalent) daily  - RV >/= 175% predicted (>/= 200% if homogeneous)  - FEV1 15-45% predicted  - TLC >100% predicted  - 100-541m (150-529m if homogeneous)  - Not actively smoking (for at least 4 months)  - ABG with PaCO2 <50 on room air  - ABG with PaO2 >45 on room air  - ECHO with EF >45% and sPAP <1mmHg  - Actively participating in pulmonary rehab   Follow up in 3 months.   Mariah Comas, MD [facility] Office: [phone]   Current Outpatient Medications:    acetaminophen (TYLENOL) 500 MG tablet, Take 1 tablet (500 mg total) by mouth every 6 (six) hours as needed., Disp: 30 tablet, Rfl: 0   albuterol (PROVENTIL) (2.5 MG/3ML) 0.083% nebulizer solution, Take 3 mLs (2.5 mg total) by nebulization 3 (three) times daily as needed for wheezing or shortness of breath., Disp: 75 mL, Rfl: 0   albuterol (VENTOLIN HFA) 108 (90 Base) MCG/ACT inhaler, Inhale 1-2 puffs into the lungs every 6 (six) hours as needed for wheezing or shortness of breath., Disp: 8 g, Rfl: 2   albuterol (VENTOLIN HFA) 108 (90 Base) MCG/ACT inhaler, Inhale 2 puffs into the lungs every 6 (six) hours as needed for wheezing or shortness of breath., Disp: 18 g, Rfl: 1   amLODipine (NORVASC) 5 MG tablet, Take 1 tablet (5 mg total) by mouth daily., Disp: 30 tablet, Rfl: 0   atorvastatin (LIPITOR) 40 MG tablet, Take 1 tablet (40 mg total) by mouth daily., Disp: 90 tablet, Rfl: 3    baclofen (LIORESAL) 10 MG tablet, Take 10 mg by mouth 2 (two) times daily as needed for muscle spasms., Disp: , Rfl:    Budeson-Glycopyrrol-Formoterol (BREZTRI AEROSPHERE) 160-9-4.8 MCG/ACT AERO, Inhale 2 puffs into the lungs in the morning and at bedtime., Disp: 10.7 g, Rfl: 3   DULoxetine (CYMBALTA) 30 MG capsule, Take 30 mg by mouth 2 (two) times daily., Disp: , Rfl:    DYMISTA 137-50 MCG/ACT SUSP, 1 spray each nostril twice a day as needed for runny/stuffy nose control., Disp: 23 g, Rfl: 3   feeding supplement (ENSURE ENLIVE / ENSURE PLUS) LIQD, Take 237 mLs by mouth 2 (two) times daily between meals., Disp: 237 mL, Rfl:    Ferrous Sulfate (IRON) 325 (65 Fe) MG TABS, Take 1 tablet (325 mg total) by mouth 2 (two) times daily. (Patient taking differently: Take 325 mg by mouth daily.), Disp: 60 each, Rfl: 5   fluticasone (FLOVENT HFA) 110 MCG/ACT inhaler,  2 puffs 2-3 times a day for 2 weeks at a time during respiratory infections or worsening symptoms., Disp: 12 g, Rfl: 3   irbesartan (AVAPRO) 300 MG tablet, Take 1 tablet (300 mg total) by mouth daily., Disp: 90 tablet, Rfl: 2   levothyroxine (SYNTHROID) 50 MCG tablet, Take 50 mcg by mouth daily before breakfast., Disp: , Rfl:    montelukast (SINGULAIR) 10 MG tablet, Take 1 tablet (10 mg total) by mouth at bedtime., Disp: 30 tablet, Rfl: 3   Olopatadine HCl 0.2 % SOLN, 1 drop each eye daily as needed., Disp: 2.5 mL, Rfl: 3

## 2023-05-29 NOTE — Patient Instructions (Addendum)
Start spiriva 2.51mcg 2 puffs daily  Start advair 230-34mcg 2 puffs twice daily - rinse mouth out after each use  Use albuterol inhaler 1-2 puffs every 4-6 hours  I will look at referral to [facility] to consider valve procedure for your emphysema.  Follow up in 3 months, call sooner if needed

## 2023-05-31 ENCOUNTER — Telehealth: Payer: Self-pay | Admitting: Pulmonary Disease

## 2023-05-31 ENCOUNTER — Encounter: Payer: Self-pay | Admitting: Pulmonary Disease

## 2023-05-31 NOTE — Telephone Encounter (Signed)
Please let patient know that we need to get full pulmonary function tests which can be scheduled at the hospital at her earliest convenience. We will also need an ABG which can be done at the same time. I have placed order for PFTs. Please check if ABG order is correct.   I have also referred her to pulmonary rehab program. She has to be participating in this program to be considered for the lung valve reduction procedure that we discussed at her last office visit.   Once the PFTs and ABG are completed we will see if she still qualifies for referral for the valve procedure.  Thanks, JD

## 2023-06-04 NOTE — Telephone Encounter (Signed)
Patient aware and agrees to orders.

## 2023-06-05 ENCOUNTER — Encounter (HOSPITAL_COMMUNITY): Payer: Self-pay

## 2023-06-05 ENCOUNTER — Emergency Department (HOSPITAL_COMMUNITY): Payer: Medicaid Other

## 2023-06-05 ENCOUNTER — Emergency Department (HOSPITAL_COMMUNITY)
Admission: EM | Admit: 2023-06-05 | Discharge: 2023-06-05 | Disposition: A | Discharge status: Home / Self Care | Payer: Medicaid Other | Attending: Emergency Medicine | Admitting: Emergency Medicine

## 2023-06-05 DIAGNOSIS — Z7951 Long term (current) use of inhaled steroids: Secondary | ICD-10-CM | POA: Insufficient documentation

## 2023-06-05 DIAGNOSIS — Z79899 Other long term (current) drug therapy: Secondary | ICD-10-CM | POA: Insufficient documentation

## 2023-06-05 DIAGNOSIS — S8262XA Displaced fracture of lateral malleolus of left fibula, initial encounter for closed fracture: Secondary | ICD-10-CM | POA: Diagnosis not present

## 2023-06-05 DIAGNOSIS — I10 Essential (primary) hypertension: Secondary | ICD-10-CM | POA: Insufficient documentation

## 2023-06-05 DIAGNOSIS — J449 Chronic obstructive pulmonary disease, unspecified: Secondary | ICD-10-CM | POA: Diagnosis not present

## 2023-06-05 DIAGNOSIS — X501XXA Overexertion from prolonged static or awkward postures, initial encounter: Secondary | ICD-10-CM | POA: Diagnosis not present

## 2023-06-05 DIAGNOSIS — S99912A Unspecified injury of left ankle, initial encounter: Secondary | ICD-10-CM | POA: Diagnosis present

## 2023-06-05 MED ORDER — HYDROCODONE-ACETAMINOPHEN 5-325 MG PO TABS
1.0000 | ORAL_TABLET | Freq: Once | ORAL | Status: AC
  Administered 2023-06-05: 1 via ORAL
  Filled 2023-06-05: qty 1

## 2023-06-05 MED ORDER — HYDROCODONE-ACETAMINOPHEN 5-325 MG PO TABS: 1.0000 | ORAL_TABLET | Freq: Four times a day (QID) | ORAL | 0 refills | Status: DC | PRN

## 2023-06-05 NOTE — Discharge Instructions (Signed)
As we discussed you have a small break on the outside ankle bone on the left side.  We have placed you in a boot to wear for this and given you crutches to use as well.  You may bear weight on this as you can tolerate.  You may take the boot off to sleep and shower but need to wear it at all times when you walk.  I have given you a few doses of Vicodin which is a narcotic pain medication for you to take as prescribed as needed for severe pain only.  Do not drive or operate heavy machinery while taking this medication as it can be sedating.  I have also given you a referral to orthopedics with the number to call to schedule an appointment for follow-up.  Please call them at your earliest convenience to schedule follow-up appointment.  Return if development of any new or worsening symptoms.

## 2023-06-05 NOTE — ED Notes (Signed)
Notified provider of blood pressure, patient okay to discharge.

## 2023-06-05 NOTE — ED Notes (Signed)
Ortho team called for placement of CAM boot and crutches education.

## 2023-06-05 NOTE — ED Triage Notes (Signed)
Pt was walking and she believes she twisted her left ankle which caused her to fall to the ground, She does not mention having a LOC. Pt's left ankle appears with noticeable swelling, but can move her toes and has feeling in her foot. The injury occurred this past Saturday. She has been icing the ankle, as well as elevating it.

## 2023-06-05 NOTE — ED Provider Notes (Signed)
[place] [facility] DEPARTMENT AT [facility] Provider Note   CSN: [id number] Arrival date & time: 06/05/23  1854     History  Chief Complaint  Patient presents with   Foot Injury    Mariah Foster is a 62 y.o. female.  Patient with history of COPD, HTN, HLD presents today with complaints of left ankle injury.  She states that same occurred last Saturday (4 days ago) when she tripped and rolled her ankle.  She did not fall, hit her head, or lose consciousness.  She denies a coagulated.  She has been able to walk since but has significant pain in her left ankle with swelling.  She has been taken OTC pain meds with minimal improvement.  Denies any other injuries or complaints.  The history is provided by the patient. No language interpreter was used.  Foot Injury      Home Medications Prior to Admission medications   Medication Sig Start Date End Date Taking? Authorizing Provider  acetaminophen (TYLENOL) 500 MG tablet Take 1 tablet (500 mg total) by mouth every 6 (six) hours as needed. 07/01/19   Bast, Gloris [place] A, NP  albuterol (PROVENTIL) (2.5 MG/3ML) 0.083% nebulizer solution Take 3 mLs (2.5 mg total) by nebulization 3 (three) times daily as needed for wheezing or shortness of breath. 09/10/22   Pricilla Loveless, MD  albuterol (VENTOLIN HFA) 108 (90 Base) MCG/ACT inhaler Inhale 2 puffs into the lungs every 6 (six) hours as needed for wheezing or shortness of breath. 05/23/23   Padgett, Pilar Grammes, MD  amLODipine (NORVASC) 5 MG tablet Take 1 tablet (5 mg total) by mouth daily. 09/10/22   Pricilla Loveless, MD  atorvastatin (LIPITOR) 40 MG tablet Take 1 tablet (40 mg total) by mouth daily. 02/27/23   Meriam [place], MD  baclofen (LIORESAL) 10 MG tablet Take 10 mg by mouth 2 (two) times daily as needed for muscle spasms. 09/15/21   [provider]  DULoxetine (CYMBALTA) 30 MG capsule Take 30 mg by mouth 2 (two) times daily. 08/29/21   [provider]   DYMISTA 137-50 MCG/ACT SUSP 1 spray each nostril twice a day as needed for runny/stuffy nose control. 05/23/23   Marcelyn Bruins, MD  feeding supplement (ENSURE ENLIVE / ENSURE PLUS) LIQD Take 237 mLs by mouth 2 (two) times daily between meals. 04/04/23   Narda Bonds, MD  Ferrous Sulfate (IRON) 325 (65 Fe) MG TABS Take 1 tablet (325 mg total) by mouth 2 (two) times daily. Patient taking differently: Take 325 mg by mouth daily. 10/15/17   Meryl Dare, MD  fluticasone-salmeterol (ADVAIR HFA) [phone] MCG/ACT inhaler Inhale 2 puffs into the lungs 2 (two) times daily. 05/29/23   Martina Sinner, MD  irbesartan (AVAPRO) 300 MG tablet Take 1 tablet (300 mg total) by mouth daily. 01/01/23   Meriam [place], MD  levothyroxine (SYNTHROID) 50 MCG tablet Take 50 mcg by mouth daily before breakfast.    [provider]  montelukast (SINGULAIR) 10 MG tablet Take 1 tablet (10 mg total) by mouth at bedtime. 05/23/23   Marcelyn Bruins, MD  Olopatadine HCl 0.2 % SOLN 1 drop each eye daily as needed. 05/23/23   Marcelyn Bruins, MD  Tiotropium Bromide Monohydrate (SPIRIVA RESPIMAT) 2.5 MCG/ACT AERS Inhale 2 puffs into the lungs daily. 05/29/23   Martina Sinner, MD  Tiotropium Bromide Monohydrate (SPIRIVA RESPIMAT) 2.5 MCG/ACT AERS Inhale 2 puffs into the lungs daily. 05/29/23   Melody Comas  B, MD  potassium chloride (K-DUR) 10 MEQ tablet Take 1 tablet (10 mEq total) by mouth daily. 03/14/17 05/26/19  Maxwell Caul, PA-C      Allergies    Flagyl [metronidazole] and Metronidazole hcl    Review of Systems   Review of Systems  Musculoskeletal:  Positive for arthralgias.  All other systems reviewed and are negative.   Physical Exam Updated Vital Signs BP (!) 160/121   Pulse 88   Temp 98.3 F (36.8 C) (Oral)   Resp 16   SpO2 98%  Physical Exam Vitals and nursing note reviewed.  Constitutional:      General: She is not in acute distress.    Appearance:  Normal appearance. She is normal weight. She is not ill-appearing, toxic-appearing or diaphoretic.  HENT:     Head: Normocephalic and atraumatic.  Cardiovascular:     Rate and Rhythm: Normal rate.  Pulmonary:     Effort: Pulmonary effort is normal. No respiratory distress.  Musculoskeletal:        General: Normal range of motion.     Cervical back: Normal range of motion.     Comments: TTP left lateral malleolus with swelling.  No obvious deformity.  ROM limited due to pain.  Compartments soft and DP and PT pulses present.  Good capillary refill.  Skin:    General: Skin is warm and dry.  Neurological:     General: No focal deficit present.     Mental Status: She is alert.  Psychiatric:        Mood and Affect: Mood normal.        Behavior: Behavior normal.     ED Results / Procedures / Treatments   Labs (all labs ordered are listed, but only abnormal results are displayed) Labs Reviewed - No data to display  EKG None  Radiology DG Ankle Complete Left  Result Date: 06/05/2023 CLINICAL DATA:  injury EXAM: LEFT ANKLE COMPLETE - 3 VIEW; LEFT FOOT - COMPLETE 3 VIEW COMPARISON:  None Available. FINDINGS: Tiny avulsion fragment identified at the lateral malleolus. There is associated soft tissue swelling. Medial ankle mortise appears anatomic. Osseous structures of the ankle and foot are otherwise intact. No osteolytic or osteoblastic changes. Joint spaces are maintained. IMPRESSION: Avulsion injury at the lateral malleolus with soft tissue swelling. Osseous structures are otherwise intact. Electronically Signed   By: Layla Maw M.D.   On: 06/05/2023 19:53   DG Foot Complete Left  Result Date: 06/05/2023 CLINICAL DATA:  injury EXAM: LEFT ANKLE COMPLETE - 3 VIEW; LEFT FOOT - COMPLETE 3 VIEW COMPARISON:  None Available. FINDINGS: Tiny avulsion fragment identified at the lateral malleolus. There is associated soft tissue swelling. Medial ankle mortise appears anatomic. Osseous  structures of the ankle and foot are otherwise intact. No osteolytic or osteoblastic changes. Joint spaces are maintained. IMPRESSION: Avulsion injury at the lateral malleolus with soft tissue swelling. Osseous structures are otherwise intact. Electronically Signed   By: Layla Maw M.D.   On: 06/05/2023 19:53    Procedures Procedures    Medications Ordered in ED Medications  HYDROcodone-acetaminophen (NORCO/VICODIN) 5-325 MG per tablet 1 tablet (1 tablet Oral Given 06/05/23 2142)    ED Course/ Medical Decision Making/ A&P                                 Medical Decision Making Amount and/or Complexity of Data Reviewed Radiology: ordered.  Risk Prescription  drug management.   Patient presents today with complaints of left foot injury x 4 days ago. She is afebrile, non-toxic appearing, and in no acute distress with reassuring vital signs. Physical exam reveals TTP over the lateral malleolus of the left foot. No obvious deformity. ROM limited due to pain. X-ray imaging ordered by triage provider prior to my evaluation and has resulted and reveals  Avulsion injury at the lateral malleolus with soft tissue swelling. Osseous structures are otherwise intact.  I have personally reviewed and interpreted this imaging agree with radiology interpretation.  Patient placed in a CAM boot and given crutches with ortho referral for follow-up. Weight bear as tolerated. Given vicodin for pain with improvement, will send for a few doses to go home with as well. PDMP reviewed. Patient advised not to drive or operate heavy machinery while taking these medications. Evaluation and diagnostic testing in the emergency department does not suggest an emergent condition requiring admission or immediate intervention beyond what has been performed at this time.  Plan for discharge with close PCP follow-up.  Patient is understanding and amenable with plan, educated on red flag symptoms that would prompt immediate  return.  Patient discharged in stable condition.  Findings and plan of care discussed with supervising physician Dr. Eloise Harman who is in agreement.    Final Clinical Impression(s) / ED Diagnoses Final diagnoses:  Closed avulsion fracture of lateral malleolus of left fibula, initial encounter    Rx / DC Orders ED Discharge Orders          Ordered    HYDROcodone-acetaminophen (NORCO/VICODIN) 5-325 MG tablet  Every 6 hours PRN        06/05/23 2208          An After Visit Summary was printed and given to the patient.     Vear Clock 06/05/23 2211    Rondel Baton, MD 06/06/23 [phone]

## 2023-06-05 NOTE — Progress Notes (Signed)
Orthopedic Tech Progress Note Patient Details:  RHEDA WYMA January 01, 1961 [id number]  Ortho Devices Type of Ortho Device: CAM walker, Crutches Ortho Device/Splint Location: LLE Ortho Device/Splint Interventions: Ordered, Application, Adjustment   Post Interventions Patient Tolerated: Well, Ambulated well Instructions Provided: Adjustment of device, Care of device, Poper ambulation with device  [place] A Gerilyn Pilgrim 06/05/2023, 10:11 PM

## 2023-06-10 ENCOUNTER — Other Ambulatory Visit: Payer: Self-pay

## 2023-06-10 ENCOUNTER — Emergency Department (HOSPITAL_COMMUNITY)
Admission: EM | Admit: 2023-06-10 | Discharge: 2023-06-10 | Disposition: A | Discharge status: Home / Self Care | Payer: Medicaid Other | Attending: Emergency Medicine | Admitting: Emergency Medicine

## 2023-06-10 ENCOUNTER — Other Ambulatory Visit (HOSPITAL_COMMUNITY): Payer: Self-pay

## 2023-06-10 ENCOUNTER — Encounter (HOSPITAL_COMMUNITY): Payer: Self-pay | Admitting: Emergency Medicine

## 2023-06-10 ENCOUNTER — Emergency Department (HOSPITAL_COMMUNITY): Payer: Medicaid Other

## 2023-06-10 DIAGNOSIS — R519 Headache, unspecified: Secondary | ICD-10-CM | POA: Diagnosis present

## 2023-06-10 DIAGNOSIS — I10 Essential (primary) hypertension: Secondary | ICD-10-CM | POA: Diagnosis not present

## 2023-06-10 DIAGNOSIS — U071 COVID-19: Secondary | ICD-10-CM | POA: Diagnosis not present

## 2023-06-10 DIAGNOSIS — J449 Chronic obstructive pulmonary disease, unspecified: Secondary | ICD-10-CM | POA: Insufficient documentation

## 2023-06-10 DIAGNOSIS — Z79899 Other long term (current) drug therapy: Secondary | ICD-10-CM | POA: Diagnosis not present

## 2023-06-10 DIAGNOSIS — Z7951 Long term (current) use of inhaled steroids: Secondary | ICD-10-CM | POA: Diagnosis not present

## 2023-06-10 LAB — CBC WITH DIFFERENTIAL/PLATELET
Abs Immature Granulocytes: 0.01 10*3/uL (ref 0.00–0.07)
Basophils Absolute: 0 10*3/uL (ref 0.0–0.1)
Basophils Relative: 0 %
Eosinophils Absolute: 0 10*3/uL (ref 0.0–0.5)
Eosinophils Relative: 1 %
HCT: 39.9 % (ref 36.0–46.0)
Hemoglobin: 13.2 g/dL (ref 12.0–15.0)
Immature Granulocytes: 0 %
Lymphocytes Relative: 13 %
Lymphs Abs: 0.7 10*3/uL (ref 0.7–4.0)
MCH: 30.6 pg (ref 26.0–34.0)
MCHC: 33.1 g/dL (ref 30.0–36.0)
MCV: 92.6 fL (ref 80.0–100.0)
Monocytes Absolute: 0.9 10*3/uL (ref 0.1–1.0)
Monocytes Relative: 16 %
Neutro Abs: 3.8 10*3/uL (ref 1.7–7.7)
Neutrophils Relative %: 70 %
Platelets: 313 10*3/uL (ref 150–400)
RBC: 4.31 MIL/uL (ref 3.87–5.11)
RDW: 12.6 % (ref 11.5–15.5)
WBC: 5.5 10*3/uL (ref 4.0–10.5)
nRBC: 0 % (ref 0.0–0.2)

## 2023-06-10 LAB — BASIC METABOLIC PANEL
Anion gap: 10 (ref 5–15)
BUN: 17 mg/dL (ref 8–23)
CO2: 25 mmol/L (ref 22–32)
Calcium: 9.1 mg/dL (ref 8.9–10.3)
Chloride: 101 mmol/L (ref 98–111)
Creatinine, Ser: 0.76 mg/dL (ref 0.44–1.00)
GFR, Estimated: >60 mL/min (ref >60)
Glucose, Bld: 113 mg/dL — ABNORMAL HIGH (ref 70–99)
Potassium: 3.4 mmol/L — ABNORMAL LOW (ref 3.5–5.1)
Sodium: 136 mmol/L (ref 135–145)

## 2023-06-10 LAB — SARS CORONAVIRUS 2 BY RT PCR: SARS Coronavirus 2 by RT PCR: POSITIVE — AB

## 2023-06-10 MED ORDER — DIPHENHYDRAMINE HCL 50 MG/ML IJ SOLN
12.5000 mg | Freq: Once | INTRAMUSCULAR | Status: AC
  Administered 2023-06-10: 12.5 mg via INTRAVENOUS
  Filled 2023-06-10: qty 1

## 2023-06-10 MED ORDER — KETOROLAC TROMETHAMINE 15 MG/ML IJ SOLN
15.0000 mg | Freq: Once | INTRAMUSCULAR | Status: AC
  Administered 2023-06-10: 15 mg via INTRAVENOUS
  Filled 2023-06-10: qty 1

## 2023-06-10 MED ORDER — PAXLOVID (300/100) 20 X 150 MG & 10 X 100MG PO TBPK
3.0000 | ORAL_TABLET | Freq: Two times a day (BID) | ORAL | 0 refills | Status: AC
  Filled 2023-06-10: qty 30, 5d supply, fill #0

## 2023-06-10 MED ORDER — METOCLOPRAMIDE HCL 5 MG/ML IJ SOLN
10.0000 mg | Freq: Once | INTRAMUSCULAR | Status: AC
  Administered 2023-06-10: 10 mg via INTRAVENOUS
  Filled 2023-06-10: qty 2

## 2023-06-10 MED ORDER — NIRMATRELVIR/RITONAVIR (PAXLOVID)TABLET
3.0000 | ORAL_TABLET | Freq: Two times a day (BID) | ORAL | Status: DC
  Filled 2023-06-10: qty 30

## 2023-06-10 NOTE — Discharge Instructions (Addendum)
You were seen for your headache and upper respiratory tract infection in the emergency department.   At home, please use Tylenol and ibuprofen for your muscle aches and fevers.  Please use over-the-counter cough medication or tea with honey for your cough. Perform sinus rinses for your sinus pain.  Take the Paxlovid to help prevent worsening of your COVID infection.  Stop taking your atorvastatin while on this medication and resume it 3 days after you stop the Paxlovid.  Follow-up with your primary doctor in 2-3 days regarding your visit.  This may be over the phone if you are feeling better or in person if you are feeling worse.  Return immediately to the emergency department if you experience any of the following: Difficulty breathing, or any other concerning symptoms.  Thank you for visiting our Emergency Department. It was a pleasure taking care of you today.

## 2023-06-10 NOTE — ED Provider Notes (Signed)
EMERGENCY DEPARTMENT AT [facility] Provider Note   CSN: [id number] Arrival date & time: 06/10/23  3474     History  Chief Complaint  Patient presents with   Headache    Mariah Foster is a 62 y.o. female.  62 year old female with history of COPD, hypertension, and hyperlipidemia presents the emergency department headache.  Patient reports that last night at 8 PM she tried taking some Dymista nasal spray for congestion that she was having and afterwards started to develop a headache.  Was gradual in onset, frontal, throbbing, without photo or phonophobia.  No nausea or vomiting.  Does not typically get headaches.  No other neurologic symptoms.  Says she has also had some congestion, sore throat, and nonproductive cough recently.  Has had chills but no fevers.  Has been trying Tylenol and ibuprofen without relief.       Home Medications Prior to Admission medications   Medication Sig Start Date End Date Taking? Authorizing Provider  nirmatrelvir & ritonavir (PAXLOVID, 300/100,) 20 x 150 MG & 10 x 100MG  TBPK Take 3 tablets by mouth 2 (two) times daily for 5 days. 06/10/23 06/15/23 Yes Rondel Baton, MD  acetaminophen (TYLENOL) 500 MG tablet Take 1 tablet (500 mg total) by mouth every 6 (six) hours as needed. 07/01/19   Bast, Gloris Manchester A, NP  albuterol (PROVENTIL) (2.5 MG/3ML) 0.083% nebulizer solution Take 3 mLs (2.5 mg total) by nebulization 3 (three) times daily as needed for wheezing or shortness of breath. 09/10/22   Pricilla Loveless, MD  albuterol (VENTOLIN HFA) 108 (90 Base) MCG/ACT inhaler Inhale 2 puffs into the lungs every 6 (six) hours as needed for wheezing or shortness of breath. 05/23/23   Padgett, Pilar Grammes, MD  amLODipine (NORVASC) 5 MG tablet Take 1 tablet (5 mg total) by mouth daily. 09/10/22   Pricilla Loveless, MD  atorvastatin (LIPITOR) 40 MG tablet Take 1 tablet (40 mg total) by mouth daily. 02/27/23   Meriam Sprague, MD  baclofen  (LIORESAL) 10 MG tablet Take 10 mg by mouth 2 (two) times daily as needed for muscle spasms. 09/15/21   [provider]  DULoxetine (CYMBALTA) 30 MG capsule Take 30 mg by mouth 2 (two) times daily. 08/29/21   [provider]  DYMISTA 137-50 MCG/ACT SUSP 1 spray each nostril twice a day as needed for runny/stuffy nose control. 05/23/23   Marcelyn Bruins, MD  feeding supplement (ENSURE ENLIVE / ENSURE PLUS) LIQD Take 237 mLs by mouth 2 (two) times daily between meals. 04/04/23   Narda Bonds, MD  Ferrous Sulfate (IRON) 325 (65 Fe) MG TABS Take 1 tablet (325 mg total) by mouth 2 (two) times daily. Patient taking differently: Take 325 mg by mouth daily. 10/15/17   Meryl Dare, MD  fluticasone-salmeterol (ADVAIR HFA) [phone] MCG/ACT inhaler Inhale 2 puffs into the lungs 2 (two) times daily. 05/29/23   Martina Sinner, MD  HYDROcodone-acetaminophen (NORCO/VICODIN) 5-325 MG tablet Take 1-2 tablets by mouth every 6 (six) hours as needed for severe pain. 06/05/23   Smoot, Shawn Route, PA-C  irbesartan (AVAPRO) 300 MG tablet Take 1 tablet (300 mg total) by mouth daily. 01/01/23   Meriam Sprague, MD  levothyroxine (SYNTHROID) 50 MCG tablet Take 50 mcg by mouth daily before breakfast.    [provider]  montelukast (SINGULAIR) 10 MG tablet Take 1 tablet (10 mg total) by mouth at bedtime. 05/23/23   Marcelyn Bruins, MD  Olopatadine HCl 0.2 %  SOLN 1 drop each eye daily as needed. 05/23/23   Marcelyn Bruins, MD  Tiotropium Bromide Monohydrate (SPIRIVA RESPIMAT) 2.5 MCG/ACT AERS Inhale 2 puffs into the lungs daily. 05/29/23   Martina Sinner, MD  Tiotropium Bromide Monohydrate (SPIRIVA RESPIMAT) 2.5 MCG/ACT AERS Inhale 2 puffs into the lungs daily. 05/29/23   Martina Sinner, MD  potassium chloride (K-DUR) 10 MEQ tablet Take 1 tablet (10 mEq total) by mouth daily. 03/14/17 05/26/19  Maxwell Caul, PA-C      Allergies    Flagyl [metronidazole] and  Metronidazole hcl    Review of Systems   Review of Systems  Physical Exam Updated Vital Signs BP (!) 146/75   Pulse 93   Temp 98.6 F (37 C) (Oral)   Resp 18   SpO2 94%  Physical Exam Vitals and nursing note reviewed.  Constitutional:      General: She is not in acute distress.    Appearance: She is well-developed.  HENT:     Head: Normocephalic and atraumatic.     Right Ear: External ear normal.     Left Ear: External ear normal.     Nose: Congestion present.     Mouth/Throat:     Mouth: Mucous membranes are moist.     Pharynx: Oropharynx is clear. No oropharyngeal exudate or posterior oropharyngeal erythema.     Comments: Frontal sinus tenderness to palpation as well as maxillary sinus tenderness to palpation Eyes:     Extraocular Movements: Extraocular movements intact.     Conjunctiva/sclera: Conjunctivae normal.     Pupils: Pupils are equal, round, and reactive to light.  Neck:     Comments: No meningismus Pulmonary:     Effort: Pulmonary effort is normal. No respiratory distress.     Breath sounds: Normal breath sounds.  Musculoskeletal:     Cervical back: Normal range of motion and neck supple.     Comments: Boot on left lower extremity from broken ankle  Skin:    General: Skin is warm and dry.  Neurological:     Mental Status: She is alert and oriented to person, place, and time. Mental status is at baseline.     Comments: MENTAL STATUS: AAOx3 CRANIAL NERVES: II: Pupils equal and reactive 5 mm BL, no RAPD, no VF deficits III, IV, VI: EOM intact, no gaze preference or deviation, no nystagmus. V: normal sensation to light touch in V1, V2, and V3 segments bilaterally VII: no facial weakness or asymmetry, no nasolabial fold flattening VIII: normal hearing to speech and finger friction IX, X: normal palatal elevation, no uvular deviation XI: 5/5 head turn and 5/5 shoulder shrug bilaterally XII: midline tongue protrusion MOTOR: 5/5 strength in R shoulder  flexion, elbow flexion and extension, and grip strength. 5/5 strength in L shoulder flexion, elbow flexion and extension, and grip strength.  5/5 strength in R hip and knee flexion, knee extension, ankle plantar and dorsiflexion. 5/5 strength in L hip and knee flexion, knee extension, ankle plantar and dorsiflexion. SENSORY: Normal sensation to light touch in all extremities STATION: no truncal ataxia  Psychiatric:        Mood and Affect: Mood normal.     ED Results / Procedures / Treatments   Labs (all labs ordered are listed, but only abnormal results are displayed) Labs Reviewed  SARS CORONAVIRUS 2 BY RT PCR - Abnormal; Notable for the following components:      Result Value   SARS Coronavirus 2 by RT PCR POSITIVE (*)  All other components within normal limits  BASIC METABOLIC PANEL - Abnormal; Notable for the following components:   Potassium 3.4 (*)    Glucose, Bld 113 (*)    All other components within normal limits  CBC WITH DIFFERENTIAL/PLATELET    EKG None  Radiology CT Head Wo Contrast  Result Date: 06/10/2023 CLINICAL DATA:  Headache beginning last night. EXAM: CT HEAD WITHOUT CONTRAST TECHNIQUE: Contiguous axial images were obtained from the base of the skull through the vertex without intravenous contrast. RADIATION DOSE REDUCTION: This exam was performed according to the departmental dose-optimization program which includes automated exposure control, adjustment of the mA and/or kV according to patient size and/or use of iterative reconstruction technique. COMPARISON:  12/15/2021 FINDINGS: Brain: Initial images are degraded by motion. Repeat imaging was performed. No evidence of old or acute infarction, mass, hemorrhage, hydrocephalus or extra-axial collection. Vascular: There is atherosclerotic calcification of the major vessels at the base of the brain. Skull: Negative Sinuses/Orbits: Clear/normal Other: None IMPRESSION: No acute finding by CT. Atherosclerotic  calcification of the major vessels at the base of the brain. Electronically Signed   By: Paulina Fusi M.D.   On: 06/10/2023 09:23    Procedures Procedures    Medications Ordered in ED Medications  ketorolac (TORADOL) 15 MG/ML injection 15 mg (15 mg Intravenous Given 06/10/23 0902)  metoCLOPramide (REGLAN) injection 10 mg (10 mg Intravenous Given 06/10/23 0902)  diphenhydrAMINE (BENADRYL) injection 12.5 mg (12.5 mg Intravenous Given 06/10/23 0102)    ED Course/ Medical Decision Making/ A&P Clinical Course as of 06/10/23 2029  Mon Jun 10, 2023  1028 SARS Coronavirus 2 by RT PCR(!): POSITIVE [RP]    Clinical Course User Index [RP] Rondel Baton, MD                                 Medical Decision Making Amount and/or Complexity of Data Reviewed Labs: ordered. Decision-making details documented in ED Course. Radiology: ordered.  Risk Prescription drug management.   Mariah Foster is a 62 y.o. female with comorbidities that complicate the patient evaluation including COPD, hypertension, and hyperlipidemia who presents with URI symptoms and headache  Initial Ddx:  URI, sinusitis, pneumonia, ICH  MDM:  Feel the patient likely has a URI based on their symptoms.  Has a nonfocal neuroexam but because of her age and she says she does not have headaches a CT of the head was obtained.  They are overall well-appearing so do not feel that chest x-ray is warranted.  May have some mild sinusitis from her URI.  Plan:  COVID/flu Labs Headache cocktail CT head  ED Summary/Re-evaluation:  Patient reevaluated in the emergency department and was stable.  Was found to be COVID-positive which I suspect is the cause of her symptoms.  Was saturating well on room air.  CT head without acute abnormality.  Was given a prescription of Paxlovid and instructed to hold her statin while taking it.  Will have her follow-up with her primary doctor in several days and discussed return precautions prior  to discharge.  This patient presents to the ED for concern of complaints listed in HPI, this involves an extensive number of treatment options, and is a complaint that carries with it a high risk of complications and morbidity. Disposition including potential need for admission considered.   Dispo: DC Home. Return precautions discussed including, but not limited to, those listed in the AVS. Allowed  pt time to ask questions which were answered fully prior to dc.  Records reviewed Outpatient Clinic Notes I independently reviewed the following imaging with scope of interpretation limited to determining acute life threatening conditions related to emergency care: Chest x-ray and agree with the radiologist interpretation with the following exceptions: None I personally reviewed and interpreted cardiac monitoring: normal sinus rhythm  I have reviewed the patients home medications and made adjustments as needed         Final Clinical Impression(s) / ED Diagnoses Final diagnoses:  Sinus headache  COVID-19    Rx / DC Orders ED Discharge Orders          Ordered    nirmatrelvir & ritonavir (PAXLOVID, 300/100,) 20 x 150 MG & 10 x 100MG  TBPK  2 times daily        06/10/23 1034              Rondel Baton, MD 06/10/23 2029

## 2023-06-10 NOTE — ED Triage Notes (Signed)
Pt BIB EMS from home, c/o headache after taking Dymista. Nasal spray prescribed to treat nasal congestion. Headache ongoing since 8pm last night.   BP 170/88 P 94 RR 16 spO2 95

## 2023-06-12 ENCOUNTER — Other Ambulatory Visit (HOSPITAL_COMMUNITY): Payer: Self-pay

## 2023-06-13 NOTE — Telephone Encounter (Signed)
Called and left a voicemail asking for patient to return call to discuss.  °

## 2023-06-15 ENCOUNTER — Other Ambulatory Visit (HOSPITAL_COMMUNITY): Payer: Self-pay

## 2023-06-19 ENCOUNTER — Other Ambulatory Visit (HOSPITAL_COMMUNITY): Payer: Self-pay

## 2023-06-19 ENCOUNTER — Telehealth: Payer: Self-pay

## 2023-06-19 NOTE — Telephone Encounter (Signed)
  Her insurance is kicking back with her having another coverage. She will need to contact her case worker to have them remove her older insurance off her file. Make sure she tells them to permanently remove it and NOT do a temp removal to prevent issues in the future. I will get the prior authorization started with [employer] in the meantime to hopefully have a determination once this issue is resolved.   *Please note: Patient will have to make the call, no one else will be authorized to edit her account. Additionally, it can sometimes take up to 24-48 hours to take affect so that sooner she calls, the better. Medicaid offices close at 5pm

## 2023-06-19 NOTE — Telephone Encounter (Signed)
Pharmacy Patient Advocate Encounter   Received notification from Pt Calls Messages that prior authorization for Breztri Aerosphere 160-9-4.8MCG/ACT aerosol is required/requested.   Insurance verification completed.   The patient is insured through [facility] .   Per test claim: PA required; PA started via CoverMyMeds. KEY [id number] . Waiting for clinical questions to populate.

## 2023-06-19 NOTE — Telephone Encounter (Signed)
Pt has reached out to case worker several times stating she no longer has bcbs but caseworker never calls her back

## 2023-06-19 NOTE — Telephone Encounter (Signed)
[employer] medicaid is her primary

## 2023-06-20 NOTE — Telephone Encounter (Signed)
As of now, the pa is still pending. IF the pa comes back approved, the pharmacy can call for a 1 (one) month override for her to fill. If my memory is correct, Medicaid (and it's managed care programs too) have limited to 2 (two) of these overrides. So patient will have to talk to case worker for further fills to be covered correctly.

## 2023-06-20 NOTE — Telephone Encounter (Signed)
 Pharmacy Patient Advocate Encounter  Questions generated, answered, and submitted

## 2023-06-21 NOTE — Telephone Encounter (Signed)
Pharmacy Patient Advocate Encounter  Received notification from [facility] that Prior Authorization for [facility] Aerosphere 160-9-4.8MCG/ACT aerosol has been CANCELLED due to   "We thank you for taking the time to submit your request. However, this member has alternative pharmacy benefits. [employer] is the payer of last resort. Please have the pharmacy bill the member's other insurance plan first. If the member feels that this is in error, please have the member call Member Service's at [phone].   PA #/Case ID/Reference #: [id number]

## 2023-06-21 NOTE — Telephone Encounter (Signed)
Can someone call pharmacy and tel them they have to over ride pt talked to caseworker this is only insurance she has

## 2023-06-21 NOTE — Telephone Encounter (Signed)
Called patient  - DOB verified - advised of below notation.  Patient stated she will contacted Member Services to update her profile.

## 2023-06-24 NOTE — Telephone Encounter (Signed)
Pharmacy ran breztri thru [employer] and still saying needs pa

## 2023-06-25 ENCOUNTER — Other Ambulatory Visit (HOSPITAL_COMMUNITY): Payer: Self-pay

## 2023-06-25 NOTE — Telephone Encounter (Signed)
Will contact pt about this

## 2023-06-25 NOTE — Telephone Encounter (Signed)
I really wish I could do something further, but unfortunately, her insurance is still reading a primary payor on file. Sometimes this can take a while, and if it was on Friday that it was done, then it might take a little longer to process. The prior authorization I submitted came back as cancelled stating "We thank you for taking the time to submit your request. However, this member has alternative pharmacy benefits. [employer] [place] is the payer of last resort. Please have the pharmacy bill the member's other insurance plan first. If the member feels that this is in error, please have the member call Member Services at [phone]". I will keep patients information close by and continue to check periodically for any changes.

## 2023-06-27 ENCOUNTER — Other Ambulatory Visit (HOSPITAL_COMMUNITY): Payer: Self-pay

## 2023-07-03 ENCOUNTER — Telehealth (HOSPITAL_COMMUNITY): Payer: Self-pay

## 2023-07-03 NOTE — Telephone Encounter (Signed)
Called pt LM on VM. Pt was in a boot and could not participate in Pulmonary rehab. Calling pt to follow up and see if she is out of the boot and able to wear tennis shoes as well as apply weight.

## 2023-07-17 ENCOUNTER — Ambulatory Visit: Payer: Medicaid Other | Admitting: Cardiology

## 2023-07-31 ENCOUNTER — Ambulatory Visit: Payer: Medicaid Other | Admitting: Physician Assistant

## 2023-08-29 ENCOUNTER — Encounter: Payer: Self-pay | Admitting: Allergy

## 2023-08-29 ENCOUNTER — Ambulatory Visit: Payer: Medicaid Other | Admitting: Allergy

## 2023-08-29 ENCOUNTER — Other Ambulatory Visit: Payer: Self-pay

## 2023-08-29 VITALS — BP 138/74 | HR 64 | Temp 98.3°F | Wt 136.7 lb

## 2023-08-29 DIAGNOSIS — J310 Chronic rhinitis: Secondary | ICD-10-CM

## 2023-08-29 DIAGNOSIS — J432 Centrilobular emphysema: Secondary | ICD-10-CM | POA: Diagnosis not present

## 2023-08-29 MED ORDER — MOMETASONE FUROATE 50 MCG/ACT NA SUSP
2.0000 | Freq: Every day | NASAL | 5 refills | Status: DC
Start: 2023-08-29 — End: 2023-09-05

## 2023-08-29 MED ORDER — ALBUTEROL SULFATE (2.5 MG/3ML) 0.083% IN NEBU: 2.5000 mg | INHALATION_SOLUTION | Freq: Three times a day (TID) | RESPIRATORY_TRACT | 0 refills | Status: AC | PRN

## 2023-08-29 MED ORDER — BREZTRI AEROSPHERE 160-9-4.8 MCG/ACT IN AERO: 2.0000 | INHALATION_SPRAY | Freq: Two times a day (BID) | RESPIRATORY_TRACT | 3 refills | Status: DC

## 2023-08-29 MED ORDER — ALBUTEROL SULFATE HFA 108 (90 BASE) MCG/ACT IN AERS: 2.0000 | INHALATION_SPRAY | Freq: Four times a day (QID) | RESPIRATORY_TRACT | 1 refills | Status: DC | PRN

## 2023-08-29 MED ORDER — MONTELUKAST SODIUM 10 MG PO TABS: 10.0000 mg | ORAL_TABLET | Freq: Every day | ORAL | 3 refills | Status: DC

## 2023-08-29 MED ORDER — AZELASTINE HCL 0.1 % NA SOLN: 2.0000 | Freq: Two times a day (BID) | NASAL | 5 refills | Status: DC

## 2023-08-29 MED ORDER — LEVOCETIRIZINE DIHYDROCHLORIDE 5 MG PO TABS: 5.0000 mg | ORAL_TABLET | Freq: Every evening | ORAL | 5 refills | Status: DC

## 2023-08-29 NOTE — Progress Notes (Signed)
Follow-up Note  RE: Mariah Foster MRN: [id number] DOB: Apr 19, 1961 Date of Office Visit: 08/29/2023   History of present illness: Mariah Foster is a 62 y.o. female presenting today for follow-up of COPD and non-allergic rhinitis.  She was last seen in the office on 05/23/23 by myself.    Discussed the use of AI scribe software for clinical note transcription with the patient, who gave verbal consent to proceed.  The patient presents with a persistent cough that has remained unchanged since July. The cough is particularly bothersome at night, regardless of the patient's position, and often disrupts her sleep. The patient has been using a Breztri inhaler but not taking consistently as she thought she was running out as was not aware of how to read the counter. She also has a Spiriva inhaler, which she dislikes due to the increased coughing it induces and does not know how to use properly.  She does not have a spacer to use with the [employer].    The patient also reports a recent episode of COVID-19, which she attributes to the use of a nasal spray, Dymista.  After using the spray for the first time she states the next day she had a heavy head feeling and general fatigue, leading to a hospital visit and subsequent COVID-19 diagnosis. She states she was advised not to use that spray again.   In addition to the cough, the patient reports difficulty hearing from one ear, which she suspects might be due to an inner ear issue. She also mentions a habit of sleeping with a fan on, which she believes might contribute to a stuffy nose.     Review of systems: 10pt ROS negative unless noted above in HPI   All other systems negative unless noted above in HPI  Past medical/social/surgical/family history have been reviewed and are unchanged unless specifically indicated below.  No changes  Medication List: Current Outpatient Medications  Medication Sig Dispense Refill   acetaminophen (TYLENOL) 500 MG  tablet Take 1 tablet (500 mg total) by mouth every 6 (six) hours as needed. 30 tablet 0   albuterol (PROVENTIL) (2.5 MG/3ML) 0.083% nebulizer solution Take 3 mLs (2.5 mg total) by nebulization 3 (three) times daily as needed for wheezing or shortness of breath. 75 mL 0   albuterol (VENTOLIN HFA) 108 (90 Base) MCG/ACT inhaler Inhale 2 puffs into the lungs every 6 (six) hours as needed for wheezing or shortness of breath. 18 g 1   amLODipine (NORVASC) 5 MG tablet Take 1 tablet (5 mg total) by mouth daily. 30 tablet 0   atorvastatin (LIPITOR) 40 MG tablet Take 1 tablet (40 mg total) by mouth daily. 90 tablet 3   baclofen (LIORESAL) 10 MG tablet Take 10 mg by mouth 2 (two) times daily as needed for muscle spasms.     BREZTRI AEROSPHERE 160-9-4.8 MCG/ACT AERO SMARTSIG:2 Puff(s) Via Inhaler Morning-Night     dicyclomine (BENTYL) 20 MG tablet Take 20 mg by mouth.     DULoxetine (CYMBALTA) 30 MG capsule Take 30 mg by mouth 2 (two) times daily.     DYMISTA 137-50 MCG/ACT SUSP 1 spray each nostril twice a day as needed for runny/stuffy nose control. 23 g 3   Ferrous Sulfate (IRON) 325 (65 Fe) MG TABS Take 1 tablet (325 mg total) by mouth 2 (two) times daily. (Patient taking differently: Take 325 mg by mouth daily.) 60 each 5   HYDROcodone-acetaminophen (NORCO/VICODIN) 5-325 MG tablet Take 1-2 tablets by  mouth every 6 (six) hours as needed for severe pain. 10 tablet 0   meloxicam (MOBIC) 7.5 MG tablet Take 7.5 mg by mouth daily.     montelukast (SINGULAIR) 10 MG tablet Take 1 tablet (10 mg total) by mouth at bedtime. 30 tablet 3   valACYclovir (VALTREX) 1000 MG tablet Take 1,000 mg by mouth daily.     feeding supplement (ENSURE ENLIVE / ENSURE PLUS) LIQD Take 237 mLs by mouth 2 (two) times daily between meals. (Patient not taking: Reported on 08/29/2023) 237 mL    fluticasone-salmeterol (ADVAIR HFA) 230-21 MCG/ACT inhaler Inhale 2 puffs into the lungs 2 (two) times daily. (Patient not taking: Reported on  08/29/2023) 12 g 12   irbesartan (AVAPRO) 300 MG tablet Take 1 tablet (300 mg total) by mouth daily. (Patient not taking: Reported on 08/29/2023) 90 tablet 2   levothyroxine (SYNTHROID) 50 MCG tablet Take 50 mcg by mouth daily before breakfast. (Patient not taking: Reported on 08/29/2023)     Olopatadine HCl 0.2 % SOLN 1 drop each eye daily as needed. (Patient not taking: Reported on 08/29/2023) 2.5 mL 3   Tiotropium Bromide Monohydrate (SPIRIVA RESPIMAT) 2.5 MCG/ACT AERS Inhale 2 puffs into the lungs daily. (Patient not taking: Reported on 08/29/2023) 4 g 11   Tiotropium Bromide Monohydrate (SPIRIVA RESPIMAT) 2.5 MCG/ACT AERS Inhale 2 puffs into the lungs daily. (Patient not taking: Reported on 08/29/2023) 8 g 0   No current facility-administered medications for this visit.     Known medication allergies: Allergies  Allergen Reactions   Flagyl [Metronidazole]     unknown   Metronidazole Hcl Hives    Stomach cramps     Physical examination: Blood pressure 138/74, pulse 64, temperature 98.3 F (36.8 C), temperature source Temporal, weight 136 lb 11.2 oz (62 kg), SpO2 98%.  General: Alert, interactive, in no acute distress. HEENT: PERRLA, TMs pearly gray, turbinates moderately edematous without discharge, post-pharynx non erythematous. Neck: Supple without lymphadenopathy. Lungs: Mildly decreased breath sounds bilaterally without wheezing, rhonchi or rales. {no increased work of breathing. CV: Normal S1, S2 without murmurs. Abdomen: Nondistended, nontender. Skin: Warm and dry, without lesions or rashes. Extremities:  No clubbing, cyanosis or edema. Neuro:   Grossly intact.  Diagnositics/Labs: Labs:  Component     Latest Ref Rng 05/23/2023  IgE (Immunoglobulin E), Serum     6 - 495 IU/mL 63   D Pteronyssinus IgE     Class 0 kU/L <0.10   D Farinae IgE     Class 0 kU/L <0.10   Cat Dander IgE     Class 0 kU/L <0.10   Dog Dander IgE     Class 0 kU/L <0.10   [place] Grass IgE      Class 0 kU/L <0.10   Timothy Grass IgE     Class 0 kU/L <0.10   [place] Grass IgE     Class 0 kU/L <0.10   Cockroach, German IgE     Class 0 kU/L <0.10   Penicillium Chrysogen IgE     Class 0 kU/L <0.10   Cladosporium Herbarum IgE     Class 0 kU/L <0.10   Aspergillus Fumigatus IgE     Class 0 kU/L <0.10   Alternaria Alternata IgE     Class 0 kU/L <0.10   Maple/Box Elder IgE     Class 0 kU/L <0.10   Common Silver Charletta Cousin IgE     Class 0 kU/L <0.10   Plover, [place] IgE  Class 0 kU/L <0.10   Oak, White IgE     Class 0 kU/L <0.10   Elm, American IgE     Class 0 kU/L <0.10   Cottonwood IgE     Class 0 kU/L <0.10   Pecan, Hickory IgE     Class 0 kU/L <0.10   White Mulberry IgE     Class 0 kU/L <0.10   Ragweed, Short IgE     Class 0 kU/L <0.10   Pigweed, Rough IgE     Class 0 kU/L <0.10   Sheep Sorrel IgE Qn     Class 0 kU/L <0.10   Mouse Urine IgE     Class 0 kU/L <0.10    Assessment and plan: COPD, centrilobular - Daily controller medication(s): Breztri inhaler (yellow) 2 puffs twice a day with spacer device.  Pt does not feel spiriva works and is difficult device to use.   Advised if using Breztri twice a day she is getting similar medication as the spiriva.   Provided with spacer today and demo on proper use. Will see how she does with consistent use of Breztri and if still symptomatic will consider ?ht?va?r?Marland Kitchen She is following with Dr Francine Graven in pulmonary as well and recommend continued follow-up care. - Prior to physical activity: albuterol 2 puffs 10-15 minutes before physical activity. - Rescue medications: albuterol 2-4 puffs every 4-6 hours as needed  - Breathing control goals:  * Full participation in all desired activities (may need albuterol before activity) * Albuterol use two time or less a week on average (not counting use with activity) * Cough interfering with sleep two time or less a month * Oral steroids no more than once a year * No  hospitalizations  Non-allergic rhinitis - Environmental allergy panel was negative - Singulair 10mg  daily at bedtime.   - can use antihistamine like Xyzal 5mg  daily as needed for sneezing, nasal or eye symptoms - can use Astepro 2 sprays each nostril twice a day as needed for runny nose - can use Nasonex 2 sprays each nostril daily for 1-2 weeks at a time before stopping once nasal congestion improves for maximum benefit - for watery/itchy eyes can use Olopatadine  0.2% 1 drop each eye daily as needed  Follow-up in 3 months or sooner if needed   I appreciate the opportunity to take part in Mariah Foster's care. Please do not hesitate to contact me with questions.  Sincerely,   Margo Aye, MD Allergy/Immunology [facility]

## 2023-08-29 NOTE — Patient Instructions (Addendum)
COPD, centrilobular - Daily controller medication(s): Breztri inhaler (yellow) 2 puffs twice a day with spacer device.  - Prior to physical activity: albuterol 2 puffs 10-15 minutes before physical activity. - Rescue medications: albuterol 2-4 puffs every 4-6 hours as needed  - Breathing control goals:  * Full participation in all desired activities (may need albuterol before activity) * Albuterol use two time or less a week on average (not counting use with activity) * Cough interfering with sleep two time or less a month * Oral steroids no more than once a year * No hospitalizations  Non-allergic rhinitis - Environmental allergy panel was negative - Singulair 10mg  daily at bedtime.   - can use antihistamine like Xyzal 5mg  daily as needed for sneezing, nasal or eye symptoms - can use Astepro 2 sprays each nostril twice a day as needed for runny nose - can use Nasonex 2 sprays each nostril daily for 1-2 weeks at a time before stopping once nasal congestion improves for maximum benefit - for watery/itchy eyes can use Olopatadine  0.2% 1 drop each eye daily as needed  Follow-up in 3 months or sooner if needed

## 2023-09-02 NOTE — Progress Notes (Addendum)
Cardiology Office Note    Date:  09/05/2023  ID:  IVYANA UTTERBACK, DOB 10/21/1961, MRN [id number] PCP:  Woodfin Ganja, MD  Cardiologist: Dr. Shari Prows Electrophysiologist:  None   Chief Complaint: chest pain  History of Present Illness: .    Maythe Goede Tarazon is a 62 y.o. female with visit-pertinent history of coronary calcification seen on CT, COPD, GIB, anxiety, HTN and HLD seen for follow-up, saw Dr. Shari Prows 12/2022 for evaluation of HTN and atypical CP.  Echo 01/31/23 showed EF 60-65%, G1DD. Calcium score deferred by patient due to cost. Irbesartan and atorvastatin were increased. She was admitted for AECOPD in 03/2023, TSH noted to be low during that admission with recent down-titration of levothyroxine. Troponins were negative at that time. Of note, CT 03/2023 demonstrated contrast reflux into the hepatic veins and IVC suggesting elevated right heart pressures.  She is seen for follow-up today reporting that she has continued to have intermittent chest pain since original visit. This happens 1-2x a week typically lasting seconds but with variable features - sometimes goes through her back, sometimes wraps around chest, sometimes wakes her up feeling like indigestion. She can often times displace her left breast or drink a glass of water and symptoms ease off. She has not tried any antacids. She denies any exertional anginal component. She has chronic unchanged DOE which she attributes to her COPD (former smoker). No wheezing. She denies any palpitations or syncope. Her BP is elevated. Initial BP 154/90, recheck by me 162/82. She is a rather poor historian when it comes to her medicines - she knows she is on amlodipine (but did not yet take today) but is no longer on irbesartan for unclear reasons, reports her BP was much better controlled when she was on this, denies allergic reaction to it. Denies illicit drug use, reports rare beer.   Labwork independently reviewed: 05/2023 CBC wnl, K 3.4, Cr  0.76 03/2023 Mg 2.1, TSH suppressed, trops neg, BNP neg 01/2023 LDL 120 11/2022 LFTS OK   ROS: .    Please see the history of present illness. Reports chronic foot pain stabbing through toe (non-claudication sounding with excellent pedal pulse), skin nodule on chest wall for which I recommended f/u PCP. All other systems are reviewed and otherwise negative.  Studies Reviewed: Marland Kitchen    EKG:  EKG is ordered today, personally reviewed, demonstrating NSR 65bm, subtle ST upsloping avL, V1-V2 (seen on 03/2023 tracing), +new TWI inferiorly and V3-V6 (previously nonspecific STTW changes)  CV Studies: Cardiac studies reviewed are outlined and summarized above. Otherwise please see EMR for full report.   Current Reported Medications:.    Current Meds  Medication Sig   acetaminophen (TYLENOL) 500 MG tablet Take 1 tablet (500 mg total) by mouth every 6 (six) hours as needed.   albuterol (PROVENTIL) (2.5 MG/3ML) 0.083% nebulizer solution Take 3 mLs (2.5 mg total) by nebulization 3 (three) times daily as needed for wheezing or shortness of breath.   albuterol (VENTOLIN HFA) 108 (90 Base) MCG/ACT inhaler Inhale 2 puffs into the lungs every 6 (six) hours as needed for wheezing or shortness of breath.   amLODipine (NORVASC) 5 MG tablet Take 1 tablet (5 mg total) by mouth daily.   atorvastatin (LIPITOR) 40 MG tablet Take 1 tablet (40 mg total) by mouth daily.   baclofen (LIORESAL) 10 MG tablet Take 10 mg by mouth 2 (two) times daily as needed for muscle spasms.   feeding supplement (ENSURE ENLIVE / ENSURE PLUS) LIQD Take  237 mLs by mouth 2 (two) times daily between meals.   Ferrous Sulfate (IRON) 325 (65 Fe) MG TABS Take 1 tablet (325 mg total) by mouth 2 (two) times daily. (Patient taking differently: Take 325 mg by mouth daily.)   fluticasone-salmeterol (ADVAIR HFA) 230-21 MCG/ACT inhaler Inhale 2 puffs into the lungs 2 (two) times daily.   levothyroxine (SYNTHROID) 50 MCG tablet Take 50 mcg by mouth daily  before breakfast.    Physical Exam:    VS:  BP (!) 154/90   Pulse 69   Ht 5\' 4"  (1.626 m)   Wt 138 lb 9.6 oz (62.9 kg)   SpO2 97%   BMI 23.79 kg/m    Wt Readings from Last 3 Encounters:  09/05/23 138 lb 9.6 oz (62.9 kg)  08/29/23 136 lb 11.2 oz (62 kg)  05/29/23 137 lb (62.1 kg)    GEN: Well nourished, well developed in no acute distress NECK: No JVD; No carotid bruits CARDIAC: RRR, no murmurs, rubs, gallops RESPIRATORY:  Clear to auscultation without rales, wheezing or rhonchi  ABDOMEN: Soft, non-tender, non-distended EXTREMITIES:  No edema; No acute deformity   Asessement and Plan:.    1. Coronary calcification seen on CT with precordial pain, abnormal EKG - mixed features with abnormal EKG both today and historically. Also had question of abnormal right heart pressures on CT in 03/2023. D/w DOD Dr. Excell Seltzer - will proceed with Lexiscan nuc (patient does not think she can do treadmill with chronic foot pain) and repeat echocardiogram given abnormal EKG. She is not having any wheezing. She will bring her inhaler to the stress test. Will obtain basic labs as well. RTC after completion of testing. Hold off adding baby ASA for now until testing complete given h/o GIB, pending follow-up testing.  Informed Consent   Shared Decision Making/Informed Consent The risks [chest pain, shortness of breath, cardiac arrhythmias, dizziness, blood pressure fluctuations, myocardial infarction, stroke/transient ischemic attack, nausea, vomiting, allergic reaction, radiation exposure, metallic taste sensation and life-threatening complications (estimated to be 1 in 10,000)], benefits (risk stratification, diagnosing coronary artery disease, treatment guidance) and alternatives of a nuclear stress test were discussed in detail with Ms. Catoe and she agrees to proceed.      2. Essential HTN, abnormal TSH, hypokalemia - did not yet take her amlodipine this AM. Reports BP was controlled when she was on  "the other medicine with it," but she is unclear why she is no longer on this. She is a generally poor historian with medications. Will obtain labs today including CMET as above with TSH + free T4 (given previous suppressed value) and renin/aldo level and magnesium (given chronic appearing hypokalemia). Resume ARB would be reasonable if labs OK, with an eye towards spironolactone if adherent in follow-up. She was instructed to bring all her actual medications to next OV.  HYPERTENSION CONTROL Vitals:   09/05/23 1038 09/05/23 1123  BP: (!) 154/90 (!) 162/82    The patient's blood pressure is elevated above target today.  In order to address the patient's elevated BP: Labs and/or other diagnostics are currently pending prior to making blood pressure medication adjustments.       3. Hyperlipidemia- she is unsure whether she is taking her atorvastatin. She will review her bottles and call us with a complete med list when she gets home. Recommend to revisit monitoring at follow-up so as not to overwhelm the patient.     Disposition: F/u with me or other available APP after above testing.  Signed, [name], PA-C

## 2023-09-05 ENCOUNTER — Ambulatory Visit: Payer: Medicaid Other | Attending: Cardiology | Admitting: Physician Assistant

## 2023-09-05 ENCOUNTER — Encounter: Payer: Self-pay | Admitting: Physician Assistant

## 2023-09-05 VITALS — BP 162/82 | HR 69 | Ht 64.0 in | Wt 138.6 lb

## 2023-09-05 DIAGNOSIS — R9431 Abnormal electrocardiogram [ECG] [EKG]: Secondary | ICD-10-CM

## 2023-09-05 DIAGNOSIS — I10 Essential (primary) hypertension: Secondary | ICD-10-CM | POA: Diagnosis not present

## 2023-09-05 DIAGNOSIS — R7989 Other specified abnormal findings of blood chemistry: Secondary | ICD-10-CM

## 2023-09-05 DIAGNOSIS — R072 Precordial pain: Secondary | ICD-10-CM

## 2023-09-05 DIAGNOSIS — I2510 Atherosclerotic heart disease of native coronary artery without angina pectoris: Secondary | ICD-10-CM | POA: Diagnosis not present

## 2023-09-05 DIAGNOSIS — E876 Hypokalemia: Secondary | ICD-10-CM

## 2023-09-05 DIAGNOSIS — E785 Hyperlipidemia, unspecified: Secondary | ICD-10-CM

## 2023-09-05 NOTE — Patient Instructions (Addendum)
Medication Instructions:  PLEASE CALL THE OFFICE WHEN YOU GET HOME SO WE CAN GO OVER YOUR MEDICATION LIST BRING ALL OF YOUR MEDICATIONS TO YOUR NEXT APPOINTMENT  *If you need a refill on your cardiac medications before your next appointment, please call your pharmacy*   Lab Work: TODAY-BMET, CBC, FT4, MAG, TSH, ALDOSTERONE + RENIN ACTIVITY W/ RATIO If you have labs (blood work) drawn today and your tests are completely normal, you will receive your results only by: MyChart Message (if you have MyChart) OR A paper copy in the mail If you have any lab test that is abnormal or we need to change your treatment, we will call you to review the results.   Testing/Procedures: Your physician has requested that you have a lexiscan myoview. For further information please visit [email or web address] Please follow instruction sheet, as given.  BRING YOUR INHALER WITH YOU ON THE DAY OF YOUR STRESS TEST  Your physician has requested that you have an echocardiogram. Echocardiography is a painless test that uses sound waves to create images of your heart. It provides your doctor with information about the size and shape of your heart and how well your heart's chambers and valves are working. This procedure takes approximately one hour. There are no restrictions for this procedure. Please do NOT wear cologne, perfume, aftershave, or lotions (deodorant is allowed). Please arrive 15 minutes prior to your appointment time.  Please note: We ask at that you not bring children with you during ultrasound (echo/ vascular) testing. Due to room size and safety concerns, children are not allowed in the ultrasound rooms during exams. Our front office staff cannot provide observation of children in our lobby area while testing is being conducted. An adult accompanying a patient to their appointment will only be allowed in the ultrasound room at the discretion of the ultrasound technician under special circumstances. We  apologize for any inconvenience.   Follow-Up: At [facility], you and your health needs are our priority.  As part of our continuing mission to provide you with exceptional heart care, we have created designated Provider Care Teams.  These Care Teams include your primary Cardiologist (physician) and Advanced Practice Providers (APPs -  Physician Assistants and Nurse Practitioners) who all work together to provide you with the care you need, when you need it.  We recommend signing up for the patient portal called "MyChart".  Sign up information is provided on this After Visit Summary.  MyChart is used to connect with patients for Virtual Visits (Telemedicine).  Patients are able to view lab/test results, encounter notes, upcoming appointments, etc.  Non-urgent messages can be sent to your provider as well.   To learn more about what you can do with MyChart, go to [email or web address].    Your next appointment:   6 week(s)  Provider:   Ronie Spies, PA-C OR APP  Other Instructions

## 2023-09-06 ENCOUNTER — Telehealth: Payer: Self-pay

## 2023-09-06 DIAGNOSIS — E876 Hypokalemia: Secondary | ICD-10-CM

## 2023-09-06 MED ORDER — AMLODIPINE BESYLATE 5 MG PO TABS: 5.0000 mg | ORAL_TABLET | Freq: Every day | ORAL | 3 refills | Status: DC

## 2023-09-06 MED ORDER — POTASSIUM CHLORIDE CRYS ER 20 MEQ PO TBCR: 20.0000 meq | EXTENDED_RELEASE_TABLET | Freq: Every day | ORAL | 3 refills | Status: DC

## 2023-09-06 MED ORDER — IRBESARTAN 150 MG PO TABS: 150.0000 mg | ORAL_TABLET | Freq: Every day | ORAL | 3 refills | Status: DC

## 2023-09-06 NOTE — Telephone Encounter (Signed)
I was able to reach patient regarding lab results. I told her not to take any further levothyroxine for now given markedly suppressed TSH/abnormal free T4 and asked her to call her PCP to schedule an appointment ASAP to discuss further monitoring of this. She verbalized understanding. This may be driving up her BP and some of her symptoms. She spoke with her pharmacy and they told her to have [place] call [facility] on [address] to get an updated list. Spoke with pharmacist who relayed the following list:  Albuterol nebs TID PRN 0.083% Singular 10mg  nightly Xyzal 5mg  qpm Astepro nasal spray 2 spray BID Ventolin HFA q6 hours PRN 2 puffs Triamcinolone lotion PRN Atorvastatin 20mg  daily Levothyroxine daily - **pharmacist made a note not to refill** Amlodipine 5mg  daily last filled #30 in September - patient reported she WAS taking this at visit yesterday but should have run out by now so not really clear Spiriva last filled September 2 puffs daily  Valtrex 1g daily x 5 days   Short term/PRN or no fill recently: Breztri on hold, pharmacist unclear why - patient asked to put on hold, has not filled, prescribed on 08/29/23 by outside office Meloxicam no fill since August Advair no prior patient fill on file Dymista nasal spray no fill since August No recent irbesartan or potassium supplement on file  I updated med rec to reflect the most recent taking list.  Victorino Dike, can you help me relay the following to patient and setting this up? - refill amlodipine 5mg  daily  - start KCl - verbally instruct her to take 2 tablets when she picks up rx ASAP, then 1 tablet daily thereafter (dosing chosen based on hospital lab response to supplementation 03/2023) - start irbesartan 150mg  daily (may eventually benefit from spironolactone depending on renin/aldo levels but for now would benefit from ensuring compliance with medication and follow-up first) - needs BMET in 1 week - send copy of thyroid labs  to PCP - keep follow-up as planned - BRING ALL MEDICATION BOTTLES TO APPTS

## 2023-09-06 NOTE — Telephone Encounter (Signed)
-----   Message from [name] sent at 09/06/2023  8:18 AM EST ----- Please let pt know labs show low potassium level and elevated thyroid function. This could be contributing to her symptoms. She was supposed to call us with her actual medication list yesterday, did not hear back. Please confirm med list and send me in a telephone note. Does she have any potassium supplements at home? Do not take any more levothyroxine for now. I will advise on further supplementation/BP med once I have her home med list.

## 2023-09-06 NOTE — Telephone Encounter (Signed)
Call placed to pt regarding message below:  left a message for pt to call back.  I have routed Thyroid labs to Woodfin Ganja, MD .

## 2023-09-06 NOTE — Telephone Encounter (Signed)
Left message for patient to call back to discuss lab results. 

## 2023-09-06 NOTE — Telephone Encounter (Signed)
I called and spoke with patient, was able to reach her. I spent >20 minutes outlining each new medicine change and she wrote each one of them down. She will return 11/14 Thursday for bloodwork. I finalized the med orders and BMET.  Patient has poor health literacy and asked if we could call her PCP on Monday to relay her abnormal thyroid labs and ask them to review to advise on scheduling her an appointment - can you help with this? I already told her to stop her levothyroxine until she sees them given the degree of suppressed TSH.

## 2023-09-09 NOTE — Telephone Encounter (Signed)
Call placed to pt's PCP, Dr. [place] Lions.  They have been made aware of pt's abnormal lab results and that we have already advised pt to d/c Levothyroxine.  Suzette Battiest will reach out to pt to get her scheduled.  Have left a detailed message for the Triage RN.

## 2023-09-09 NOTE — Addendum Note (Signed)
Addended by: Burnetta Sabin on: 09/09/2023 08:56 AM   Modules accepted: Orders

## 2023-09-11 LAB — ALDOSTERONE + RENIN ACTIVITY W/ RATIO
Aldosterone: <1 ng/dL (ref 0.0–30.0)
Renin Activity, Plasma: <0.167 ng/mL/h — ABNORMAL LOW (ref 0.167–5.380)

## 2023-09-11 LAB — BASIC METABOLIC PANEL
BUN/Creatinine Ratio: 16 (ref 12–28)
BUN: 14 mg/dL (ref 8–27)
CO2: 31 mmol/L — ABNORMAL HIGH (ref 20–29)
Calcium: 9.2 mg/dL (ref 8.7–10.3)
Chloride: 101 mmol/L (ref 96–106)
Creatinine, Ser: 0.9 mg/dL (ref 0.57–1.00)
Glucose: 102 mg/dL — ABNORMAL HIGH (ref 70–99)
Potassium: 3 mmol/L — ABNORMAL LOW (ref 3.5–5.2)
Sodium: 144 mmol/L (ref 134–144)
eGFR: 71 mL/min/{1.73_m2} (ref >59)

## 2023-09-11 LAB — TSH+FREE T4
Free T4: 1.85 ng/dL — ABNORMAL HIGH (ref 0.82–1.77)
TSH: <0.005 u[IU]/mL — ABNORMAL LOW (ref 0.450–4.500)

## 2023-09-11 LAB — CBC
Hematocrit: 42.5 % (ref 34.0–46.6)
Hemoglobin: 14.1 g/dL (ref 11.1–15.9)
MCH: 30.3 pg (ref 26.6–33.0)
MCHC: 33.2 g/dL (ref 31.5–35.7)
MCV: 91 fL (ref 79–97)
Platelets: 326 10*3/uL (ref 150–450)
RBC: 4.66 x10E6/uL (ref 3.77–5.28)
RDW: 12.9 % (ref 11.7–15.4)
WBC: 5.6 10*3/uL (ref 3.4–10.8)

## 2023-09-11 LAB — MAGNESIUM: Magnesium: 1.9 mg/dL (ref 1.6–2.3)

## 2023-09-13 ENCOUNTER — Telehealth: Payer: Self-pay | Admitting: Allergy

## 2023-09-13 MED ORDER — BENZONATATE 100 MG PO CAPS: 100.0000 mg | ORAL_CAPSULE | Freq: Three times a day (TID) | ORAL | 0 refills | Status: DC | PRN

## 2023-09-13 NOTE — Addendum Note (Signed)
Addended by: Maryjean Morn D on: 09/13/2023 03:30 PM   Modules accepted: Orders

## 2023-09-13 NOTE — Telephone Encounter (Signed)
Dr. Delorse Lek, I didn't see anything in her AVS stating to send anything into the pharmacy for her cough. Please advise as to what to send in.  [place] [phone]  Patient called and stated that she has an ongoing bad cough from COPD. Patient stated that she saw Dr Delorse Lek on October 31st and spoke to her about calling something in to her pharmacy for the cough, but she went to her pharmacy and nothing was there. Patient is requesting that something be called in to her pharmacy for this cough as she can not take it any more. Patients pharmacy is [facility] on [address]. Patients call back number is [phone]

## 2023-09-13 NOTE — Telephone Encounter (Addendum)
Tried calling pt to let her know I sent in the prescription for Tessalon Perles to [facility] for her cough. No answer- left a message for a return call.

## 2023-09-13 NOTE — Telephone Encounter (Signed)
Patient called and stated that she has an ongoing bad cough from COPD. Patient stated that she saw Dr Delorse Lek on October 31st and spoke to her about calling something in to her pharmacy for the cough, but she went to her pharmacy and nothing was there. Patient is requesting that something be called in to her pharmacy for this cough as she can not take it any more. Patients pharmacy is [facility] on [address]. Patients call back number is [phone]

## 2023-09-16 NOTE — Telephone Encounter (Signed)
I called the patient and she plans to pick up Mariah Foster later today.

## 2023-09-17 ENCOUNTER — Telehealth (HOSPITAL_COMMUNITY): Payer: Self-pay | Admitting: *Deleted

## 2023-09-17 NOTE — Telephone Encounter (Signed)
Pt given instructions for MPI study.

## 2023-09-18 ENCOUNTER — Telehealth: Payer: Self-pay

## 2023-09-18 DIAGNOSIS — Z79899 Other long term (current) drug therapy: Secondary | ICD-10-CM

## 2023-09-18 NOTE — Telephone Encounter (Signed)
Call to patient to discuss outstanding BMET order. Patient has stress test at [address] office tomorrow, 09/19/23, advised she can complete BMET before or after her test tomorrow. Patient verbalizes understanding and agrees to plan. BMET order re-entered.

## 2023-09-18 NOTE — Telephone Encounter (Signed)
-----   Message from [name] sent at 09/17/2023  1:24 PM EST ----- Ms. Mariah Foster never returned for her BMET last Thursday, can we call her to check on this? She is a poor historian with poor health literacy. Can offer to refer to social worker if she would like.

## 2023-09-19 ENCOUNTER — Other Ambulatory Visit: Payer: Self-pay

## 2023-09-19 ENCOUNTER — Ambulatory Visit (HOSPITAL_COMMUNITY): Payer: Medicaid Other | Attending: Cardiology

## 2023-09-19 DIAGNOSIS — I2510 Atherosclerotic heart disease of native coronary artery without angina pectoris: Secondary | ICD-10-CM | POA: Insufficient documentation

## 2023-09-19 DIAGNOSIS — E876 Hypokalemia: Secondary | ICD-10-CM | POA: Diagnosis present

## 2023-09-19 DIAGNOSIS — I10 Essential (primary) hypertension: Secondary | ICD-10-CM | POA: Insufficient documentation

## 2023-09-19 DIAGNOSIS — E785 Hyperlipidemia, unspecified: Secondary | ICD-10-CM | POA: Diagnosis present

## 2023-09-19 DIAGNOSIS — R072 Precordial pain: Secondary | ICD-10-CM | POA: Insufficient documentation

## 2023-09-19 DIAGNOSIS — R9431 Abnormal electrocardiogram [ECG] [EKG]: Secondary | ICD-10-CM | POA: Diagnosis present

## 2023-09-19 DIAGNOSIS — R7989 Other specified abnormal findings of blood chemistry: Secondary | ICD-10-CM | POA: Insufficient documentation

## 2023-09-19 DIAGNOSIS — Z79899 Other long term (current) drug therapy: Secondary | ICD-10-CM

## 2023-09-19 LAB — MYOCARDIAL PERFUSION IMAGING
Base ST Depression (mm): 0 mm
LV dias vol: 67 mL (ref 46–106)
LV sys vol: 27 mL
Nuc Stress EF: 60 %
Peak HR: 104 {beats}/min
Rest HR: 57 {beats}/min
Rest Nuclear Isotope Dose: 10.9 mCi
SDS: 1
SRS: 0
SSS: 1
ST Depression (mm): 0 mm
Stress Nuclear Isotope Dose: 29.6 mCi
TID: 0.85

## 2023-09-19 MED ORDER — REGADENOSON 0.4 MG/5ML IV SOLN
0.4000 mg | Freq: Once | INTRAVENOUS | Status: AC
  Administered 2023-09-19: 0.4 mg via INTRAVENOUS

## 2023-09-19 MED ORDER — TECHNETIUM TC 99M TETROFOSMIN IV KIT
29.6000 | PACK | Freq: Once | INTRAVENOUS | Status: AC | PRN
  Administered 2023-09-19: 29.6 via INTRAVENOUS

## 2023-09-19 MED ORDER — TECHNETIUM TC 99M TETROFOSMIN IV KIT
10.9000 | PACK | Freq: Once | INTRAVENOUS | Status: AC | PRN
  Administered 2023-09-19: 10.9 via INTRAVENOUS

## 2023-09-20 LAB — BASIC METABOLIC PANEL
BUN/Creatinine Ratio: 22 (ref 12–28)
BUN: 20 mg/dL (ref 8–27)
CO2: 24 mmol/L (ref 20–29)
Calcium: 9.6 mg/dL (ref 8.7–10.3)
Chloride: 105 mmol/L (ref 96–106)
Creatinine, Ser: 0.93 mg/dL (ref 0.57–1.00)
Glucose: 94 mg/dL (ref 70–99)
Potassium: 4.6 mmol/L (ref 3.5–5.2)
Sodium: 143 mmol/L (ref 134–144)
eGFR: 69 mL/min/{1.73_m2} (ref >59)

## 2023-09-26 ENCOUNTER — Emergency Department (HOSPITAL_COMMUNITY): Payer: Medicaid Other

## 2023-09-26 ENCOUNTER — Emergency Department (HOSPITAL_COMMUNITY)
Admission: EM | Admit: 2023-09-26 | Discharge: 2023-09-26 | Disposition: A | Discharge status: Home / Self Care | Payer: Medicaid Other | Attending: Emergency Medicine | Admitting: Emergency Medicine

## 2023-09-26 ENCOUNTER — Other Ambulatory Visit: Payer: Self-pay

## 2023-09-26 ENCOUNTER — Encounter (HOSPITAL_COMMUNITY): Payer: Self-pay

## 2023-09-26 DIAGNOSIS — Z7951 Long term (current) use of inhaled steroids: Secondary | ICD-10-CM | POA: Diagnosis not present

## 2023-09-26 DIAGNOSIS — J449 Chronic obstructive pulmonary disease, unspecified: Secondary | ICD-10-CM | POA: Insufficient documentation

## 2023-09-26 DIAGNOSIS — Z79899 Other long term (current) drug therapy: Secondary | ICD-10-CM | POA: Insufficient documentation

## 2023-09-26 DIAGNOSIS — I10 Essential (primary) hypertension: Secondary | ICD-10-CM | POA: Diagnosis not present

## 2023-09-26 DIAGNOSIS — R1011 Right upper quadrant pain: Secondary | ICD-10-CM | POA: Insufficient documentation

## 2023-09-26 LAB — COMPREHENSIVE METABOLIC PANEL
ALT: 34 U/L (ref 0–44)
AST: 33 U/L (ref 15–41)
Albumin: 4.1 g/dL (ref 3.5–5.0)
Alkaline Phosphatase: 100 U/L (ref 38–126)
Anion gap: 10 (ref 5–15)
BUN: 31 mg/dL — ABNORMAL HIGH (ref 8–23)
CO2: 22 mmol/L (ref 22–32)
Calcium: 8.7 mg/dL — ABNORMAL LOW (ref 8.9–10.3)
Chloride: 105 mmol/L (ref 98–111)
Creatinine, Ser: 1.49 mg/dL — ABNORMAL HIGH (ref 0.44–1.00)
GFR, Estimated: 39 mL/min — ABNORMAL LOW (ref >60)
Glucose, Bld: 99 mg/dL (ref 70–99)
Potassium: 4.7 mmol/L (ref 3.5–5.1)
Sodium: 137 mmol/L (ref 135–145)
Total Bilirubin: 0.9 mg/dL (ref <1.2)
Total Protein: 7.2 g/dL (ref 6.5–8.1)

## 2023-09-26 LAB — URINALYSIS, ROUTINE W REFLEX MICROSCOPIC
Bilirubin Urine: NEGATIVE
Glucose, UA: NEGATIVE mg/dL
Hgb urine dipstick: NEGATIVE
Ketones, ur: NEGATIVE mg/dL
Leukocytes,Ua: NEGATIVE
Nitrite: NEGATIVE
Protein, ur: NEGATIVE mg/dL
Specific Gravity, Urine: 1.024 (ref 1.005–1.030)
pH: 5 (ref 5.0–8.0)

## 2023-09-26 LAB — CBC
HCT: 43 % (ref 36.0–46.0)
Hemoglobin: 14.1 g/dL (ref 12.0–15.0)
MCH: 29.6 pg (ref 26.0–34.0)
MCHC: 32.8 g/dL (ref 30.0–36.0)
MCV: 90.1 fL (ref 80.0–100.0)
Platelets: 364 10*3/uL (ref 150–400)
RBC: 4.77 MIL/uL (ref 3.87–5.11)
RDW: 13.7 % (ref 11.5–15.5)
WBC: 8.6 10*3/uL (ref 4.0–10.5)
nRBC: 0 % (ref 0.0–0.2)

## 2023-09-26 LAB — LIPASE, BLOOD: Lipase: 25 U/L (ref 11–51)

## 2023-09-26 MED ORDER — FAMOTIDINE 20 MG PO TABS
20.0000 mg | ORAL_TABLET | Freq: Once | ORAL | Status: AC
  Administered 2023-09-26: 20 mg via ORAL
  Filled 2023-09-26: qty 1

## 2023-09-26 MED ORDER — ONDANSETRON 4 MG PO TBDP
4.0000 mg | ORAL_TABLET | Freq: Once | ORAL | Status: AC
  Administered 2023-09-26: 4 mg via ORAL
  Filled 2023-09-26: qty 1

## 2023-09-26 MED ORDER — OMEPRAZOLE 20 MG PO CPDR: 20.0000 mg | DELAYED_RELEASE_CAPSULE | Freq: Every day | ORAL | 0 refills | Status: DC

## 2023-09-26 MED ORDER — LIDOCAINE VISCOUS HCL 2 % MT SOLN: 15.0000 mL | Freq: Once | OROMUCOSAL | Status: DC

## 2023-09-26 MED ORDER — ALUM & MAG HYDROXIDE-SIMETH 200-200-20 MG/5ML PO SUSP
30.0000 mL | Freq: Once | ORAL | Status: AC
  Administered 2023-09-26: 30 mL via ORAL
  Filled 2023-09-26: qty 30

## 2023-09-26 NOTE — ED Triage Notes (Signed)
Patient BIB [facility] from home for RUQ abd pain, worse today after eating. Radiating to side and back, no N/V, hx of same. BP 144/98, HR 96, 96% RA, Cbg 107, RR 22, 18g L AC

## 2023-09-26 NOTE — ED Provider Notes (Signed)
[place] EMERGENCY DEPARTMENT AT [facility] Provider Note   CSN: [id number] Arrival date & time: 09/26/23  1452     History  Chief Complaint  Patient presents with   Abdominal Pain    Mariah Foster is a 63 y.o. female with a PMH of HTN, COPD who presented to the ED for right upper quadrant pain.  Patient reports her pain started right after she Thanksgiving dinner.  States it is in her central upper abdomen radiating to her right upper quadrant.  States for the past year, she has had occasional episodes of pain like this after eating.  Denies taking medication at home for the pain.  Denies fevers, nausea, vomiting, or diarrhea.  Denies chest pain, shortness of breath.  Reports she is concerned about having a gallstone.  Denies dysuria, hematuria.  Endorses a history of prior C-sections.   Abdominal Pain      Home Medications Prior to Admission medications   Medication Sig Start Date End Date Taking? Authorizing Provider  omeprazole (PRILOSEC) 20 MG capsule Take 1 capsule (20 mg total) by mouth daily. 09/26/23 10/26/23 Yes Janyth Pupa, MD  albuterol (PROVENTIL) (2.5 MG/3ML) 0.083% nebulizer solution Take 3 mLs (2.5 mg total) by nebulization 3 (three) times daily as needed for wheezing or shortness of breath. 08/29/23   Marcelyn Bruins, MD  albuterol (VENTOLIN HFA) 108 (90 Base) MCG/ACT inhaler Inhale 2 puffs into the lungs every 6 (six) hours as needed for wheezing or shortness of breath. 08/29/23   Marcelyn Bruins, MD  amLODipine (NORVASC) 5 MG tablet Take 1 tablet (5 mg total) by mouth daily. 09/06/23   Dunn, Tacey Ruiz, PA-C  atorvastatin (LIPITOR) 20 MG tablet Take 20 mg by mouth daily.    [provider]  azelastine (ASTELIN) 0.1 % nasal spray Place 2 sprays into both nostrils 2 (two) times daily. Use in each nostril as directed    [provider]  benzonatate (TESSALON PERLES) 100 MG capsule Take 1 capsule (100 mg total) by  mouth 3 (three) times daily as needed for cough. 09/13/23   Marcelyn Bruins, MD  fluticasone-salmeterol (ADVAIR HFA) [phone] MCG/ACT inhaler Inhale 2 puffs into the lungs 2 (two) times daily. Patient not taking: Reported on 09/06/2023 05/29/23   Martina Sinner, MD  irbesartan (AVAPRO) 150 MG tablet Take 1 tablet (150 mg total) by mouth daily. 09/06/23   Dunn, Tacey Ruiz, PA-C  levocetirizine (XYZAL) 5 MG tablet Take 5 mg by mouth every evening.    [provider]  montelukast (SINGULAIR) 10 MG tablet Take 10 mg by mouth at bedtime.    [provider]  potassium chloride SA (KLOR-CON M) 20 MEQ tablet Take 1 tablet (20 mEq total) by mouth daily. 09/06/23   Dunn, Tacey Ruiz, PA-C  tiotropium (SPIRIVA) 18 MCG inhalation capsule Place 18 mcg into inhaler and inhale daily.    [provider]  triamcinolone cream (KENALOG) 0.1 % Apply 1 Application topically as needed.    [provider]  valACYclovir (VALTREX) 1000 MG tablet Take 1,000 mg by mouth daily. For 5 days    [provider]  potassium chloride (K-DUR) 10 MEQ tablet Take 1 tablet (10 mEq total) by mouth daily. 03/14/17 05/26/19  Maxwell Caul, PA-C      Allergies    Flagyl [metronidazole] and Metronidazole hcl    Review of Systems   Review of Systems  Gastrointestinal:  Positive for abdominal pain.    Physical  Exam Updated Vital Signs BP (!) 166/101   Pulse 76   Temp 97.6 F (36.4 C) (Oral)   Resp 17   SpO2 100%  Physical Exam Constitutional:      General: She is not in acute distress.    Appearance: She is well-developed. She is not ill-appearing.  HENT:     Head: Normocephalic and atraumatic.     Mouth/Throat:     Mouth: Mucous membranes are moist.     Pharynx: Oropharynx is clear.  Eyes:     Extraocular Movements: Extraocular movements intact.     Pupils: Pupils are equal, round, and reactive to light.  Cardiovascular:     Rate and Rhythm: Normal rate and regular  rhythm.     Heart sounds: Normal heart sounds. No murmur heard.    No friction rub. No gallop.  Pulmonary:     Effort: Pulmonary effort is normal.     Breath sounds: Normal breath sounds. No stridor. No wheezing, rhonchi or rales.  Abdominal:     Palpations: Abdomen is soft.     Tenderness: There is abdominal tenderness. There is no right CVA tenderness or left CVA tenderness.     Comments: Focal right upper quadrant tenderness to palpation with mild guarding  Skin:    General: Skin is warm and dry.     Capillary Refill: Capillary refill takes less than 2 seconds.  Neurological:     General: No focal deficit present.     Mental Status: She is alert.     ED Results / Procedures / Treatments   Labs (all labs ordered are listed, but only abnormal results are displayed) Labs Reviewed  COMPREHENSIVE METABOLIC PANEL - Abnormal; Notable for the following components:      Result Value   BUN 31 (*)    Creatinine, Ser 1.49 (*)    Calcium 8.7 (*)    GFR, Estimated 39 (*)    All other components within normal limits  URINALYSIS, ROUTINE W REFLEX MICROSCOPIC - Abnormal; Notable for the following components:   APPearance HAZY (*)    All other components within normal limits  LIPASE, BLOOD  CBC    EKG None  Radiology US Abdomen Limited RUQ (LIVER/GB)  Result Date: 09/26/2023 CLINICAL DATA:  Right upper quadrant pain EXAM: ULTRASOUND ABDOMEN LIMITED RIGHT UPPER QUADRANT COMPARISON:  Ultrasound 02/20/2023. FINDINGS: Gallbladder: No gallstones or wall thickening visualized. No sonographic Murphy sign noted by sonographer. Common bile duct: Diameter: 3 mm Liver: No focal lesion identified. Within normal limits in parenchymal echogenicity. Portal vein is patent on color Doppler imaging with normal direction of blood flow towards the liver. Other: None. IMPRESSION: No gallstones or ductal dilatation. Electronically Signed   By: Karen Kays M.D.   On: 09/26/2023 16:11     Procedures Procedures    Medications Ordered in ED Medications  ondansetron (ZOFRAN-ODT) disintegrating tablet 4 mg (4 mg Oral Given 09/26/23 1729)  alum & mag hydroxide-simeth (MAALOX/MYLANTA) 200-200-20 MG/5ML suspension 30 mL (30 mLs Oral Given 09/26/23 1803)  famotidine (PEPCID) tablet 20 mg (20 mg Oral Given 09/26/23 1803)    ED Course/ Medical Decision Making/ A&P Clinical Course as of 09/26/23 2316  Thu Sep 26, 2023  1710 Seen in triage. PO challenge for prerenal AKI  [CC]  1800 PO Ok   [CC]    Clinical Course User Index [CC] Glyn Ade, MD  Medical Decision Making Amount and/or Complexity of Data Reviewed Labs: ordered. Radiology: ordered.  Risk OTC drugs. Prescription drug management.   Vital signs stable, patient afebrile.  Physical exam with very focal right upper quadrant tenderness to palpation.  CBC with no leukocytosis or anemia.  Metabolic panel with elevated creatinine at 1.49, up from baseline around 0.9-1.  Lipase WNL.  UA not concerning for infection.  RUQ [place] without evidence of cholecystitis or gallstones.  Patient administered Zofran, Maalox, Pepcid and reported resolution of her symptoms.  She tolerated oral intake in the ED.  Etiology of her symptoms is likely gastritis versus peptic ulcer disease.  I have low suspicion for cholecystitis, renal stone, pancreatitis.  Prescription for omeprazole was sent to the patient's pharmacy.  I encouraged her to follow-up with her PCP.  Strict return precautions were discussed, patient voiced understanding.  She was discharged in stable condition.        Final Clinical Impression(s) / ED Diagnoses Final diagnoses:  Right upper quadrant abdominal pain    Rx / DC Orders ED Discharge Orders          Ordered    omeprazole (PRILOSEC) 20 MG capsule  Daily        09/26/23 1808              Janyth Pupa, MD 09/26/23 2316    Glyn Ade, MD 09/27/23  1507

## 2023-09-26 NOTE — Discharge Instructions (Addendum)
You were seen here today for abdominal pain.  Your workup showed no gallstones on your ultrasound, electrolytes and cell counts are within normal limits.  You are likely slightly dehydrated, please drink plenty of fluids at home.  Please use omeprazole as prescribed.  Please follow-up with your PCP.  Please return to the emergency department for chest pain, shortness of breath, severe vomiting or inability to keep down food, loss of consciousness, or any worsening symptom or concern.

## 2023-09-26 NOTE — ED Provider Triage Note (Signed)
Emergency Medicine Provider Triage Evaluation Note  Gretchen A Buccheri , a 62 y.o. female  was evaluated in triage.  Pt complains of RUQ pain for years worse today.  Review of Systems  Positive: AP Negative:   Physical Exam  BP (!) 142/94 (BP Location: Right Arm)   Pulse (!) 106   Temp 97.8 F (36.6 C) (Oral)   Resp 20   SpO2 95%  Gen:   Awake, no distress   Resp:  Normal effort  MSK:   Moves extremities without difficulty  Other:    Medical Decision Making  Medically screening exam initiated at 3:31 PM.  Appropriate orders placed.  Jenan A Verge was informed that the remainder of the evaluation will be completed by another provider, this initial triage assessment does not replace that evaluation, and the importance of remaining in the ED until their evaluation is complete.     Glyn Ade, MD 09/26/23 [phone]

## 2023-09-28 ENCOUNTER — Other Ambulatory Visit: Payer: Self-pay | Admitting: Physician Assistant

## 2023-09-30 ENCOUNTER — Telehealth: Payer: Self-pay | Admitting: Physician Assistant

## 2023-09-30 DIAGNOSIS — Z79899 Other long term (current) drug therapy: Secondary | ICD-10-CM

## 2023-09-30 DIAGNOSIS — I10 Essential (primary) hypertension: Secondary | ICD-10-CM

## 2023-09-30 NOTE — Telephone Encounter (Signed)
I was following up lab results from while my inbox was being covered while I was out 11/21, BMET was stable with improved potassium at that time.   However, she has since been to the ER I see on 11/28 with abdominal pain and had rise in her kidney function with Cr 1.49. K 4.7. Recommend to bring back in for recheck BMET as soon as she is able to help trend whether we need to adjust her irbesartan or potassium supplement. Does not look like ER felt this was necessary. I suspect some degree of mild CKD given prior fluctuating creatinine. Make sure she is drinking plenty of fluids.   Patient has poor health literacy so please be sure to make sure she understands this plan. She also relies on transportation for management. I am not sure if we can order home health to come draw labs instead of her coming to [place] for them, but OK to order if this is something we offer (under premise of chronic SOB limiting her activity).  Keep f/u echo and appt with Eligha Bridegroom as planned.

## 2023-10-01 NOTE — Telephone Encounter (Signed)
Spoke with patient and discussed message from [facility] (see note below).  Patient states she will have F/U BMET drawn when she comes in for echocardiogram on 10/07/23. BMET ordered and released to [facility].  Reviewed upcoming appointments/time/location. Patient states she is drinking "lots of water." Patient verbalized understanding of Dayna's message.

## 2023-10-03 ENCOUNTER — Telehealth: Payer: Self-pay

## 2023-10-03 DIAGNOSIS — I10 Essential (primary) hypertension: Secondary | ICD-10-CM

## 2023-10-03 NOTE — Telephone Encounter (Signed)
Referral placed to advanced HTN clinic at this time.

## 2023-10-03 NOTE — Telephone Encounter (Signed)
-----   Message from [name] sent at 09/19/2023 10:44 AM EST ----- Given patient's social challenges and low renin, hypokalemia by labwork, I would suggest we try to get her in with the advanced HTN clinic for assistance with management. She has an appointment for stress test with labs today, echo in December with f/u with Eligha Bridegroom 10/18/23 which we will keep, but I think it would be helpful for her to establish with the advanced HTN clinic for future follow-up - just make sure she knows those would be at [facility] She was a Dr. Shari Prows patient so needs a new cardiologist with our team anyway and I think Dr. Duke Salvia would be an excellent fit to help guide her through her care.

## 2023-10-07 ENCOUNTER — Ambulatory Visit (HOSPITAL_COMMUNITY): Payer: Medicaid Other | Attending: Physician Assistant

## 2023-10-07 DIAGNOSIS — I2510 Atherosclerotic heart disease of native coronary artery without angina pectoris: Secondary | ICD-10-CM | POA: Diagnosis present

## 2023-10-07 DIAGNOSIS — E785 Hyperlipidemia, unspecified: Secondary | ICD-10-CM | POA: Diagnosis present

## 2023-10-07 DIAGNOSIS — E876 Hypokalemia: Secondary | ICD-10-CM | POA: Diagnosis present

## 2023-10-07 DIAGNOSIS — I10 Essential (primary) hypertension: Secondary | ICD-10-CM | POA: Insufficient documentation

## 2023-10-07 DIAGNOSIS — R072 Precordial pain: Secondary | ICD-10-CM | POA: Insufficient documentation

## 2023-10-07 DIAGNOSIS — R9431 Abnormal electrocardiogram [ECG] [EKG]: Secondary | ICD-10-CM | POA: Insufficient documentation

## 2023-10-07 DIAGNOSIS — R079 Chest pain, unspecified: Secondary | ICD-10-CM

## 2023-10-07 DIAGNOSIS — R7989 Other specified abnormal findings of blood chemistry: Secondary | ICD-10-CM | POA: Insufficient documentation

## 2023-10-07 LAB — ECHOCARDIOGRAM COMPLETE
Area-P 1/2: 3.32 cm2
S' Lateral: 2 cm

## 2023-10-15 ENCOUNTER — Telehealth: Payer: Self-pay | Admitting: Pulmonary Disease

## 2023-10-15 NOTE — Telephone Encounter (Signed)
Patient states needs refill for Breztri inhaler. Pharmacy is [facility] [address] Patient phone number is [phone].

## 2023-10-16 NOTE — Progress Notes (Signed)
Cardiology Office Note:  .   Date:  10/18/2023  ID:  Mariah Foster, DOB December 19, 1960, MRN [id number] PCP: Woodfin Ganja, MD  [facility] Providers Cardiologist:  None    Patient Profile: .      PMH Coronary calcification CT COPD Hypertension Hyperlipidemia  Atypical chest pain  Seen by Dr. Shari Prows 12/2022 for evaluation of hypertension and atypical chest pain.  Echo 01/31/2023 showed EF 60 to 65%, G1 DD.  Calcium score deferred by patient due to cost.  Irbesartan and atorvastatin were increased.  She was admitted for AECOPD June 2024, TSH noted to be low during that admission with recent down titration of levothyroxine.  Troponins were negative at that time.  Of note, CT 03/2023 demonstrated contrast reflux into the hepatic veins and IVC suggesting elevated right heart pressures.  Seen by Ronie Spies, PA on 09/05/2023.  She reported intermittent chest pain that has continued since original visit.  Typically happens 1-2 times per week lasting seconds with variable features -sometimes goes through her back, sometimes wraps around her chest, sometimes wakes her up with a feeling like indigestion.  Symptoms ease off typically if she displaces her left breast or drinks a glass of water.  She has not tried antacids.  She denies exertional anginal component and has chronic unchanged DOE which she attributes to her COPD (former smoker).  BP was elevated.  She was a rather poor historian in regards to medications -reported being on amlodipine but did not take it that day and no longer on irbesartan for unclear reasons.  EKG revealed NSR at 65 bpm, subtle ST upsloping in aVL, V1 and V2 which was seen on 03/2023 tracing plus new T WI inferiorly and V3-V6 (previously nonspecific STTW changes).  Due to abnormal EKG, symptoms, and question of elevated right heart pressures on CT, plan was discussed with Dr. Excell Seltzer, DOD, who advised Lexiscan nuclear stress test and echocardiogram.  Lab results revealed low  renin and hypokalemia. There were multiple messages about lab results and attempts to obtain her medication list.  She was advised to continue amlodipine 5 mg daily and resume irbesartan 150 mg daily for elevated BP. Potassium supplement also initiated.  Referral was placed to advanced pretension clinic.  Nuclear stress test 09/19/2023 was low risk with no evidence of ischemia or infarction, normal EF.  Echocardiogram completed 10/07/2023 revealed normal LVEF 55 to 60%, no RWMA, G1 DD, normal RV size and function, no significant valve disease.       History of Present Illness: .   [name] is a very pleasant 62 y.o. female who is here today for follow-up of hypertension. She is feeling well and is very joyful. She reports neck pain, 'right here in my neck back here.' The discomfort is worse when she 'sits up and tries to hold my head up.' She was previously told by a doctor that she has a bone 'hitting up against another bone in my back' but has not sought further evaluation. Reports she checked her medication list with home meds to ensure accuracy. She does not have a home blood pressure monitor and is unsure if she can afford one. She recently received a Medicaid card and is unsure how this will affect her ability to afford medications and medical equipment. Remains active at home doing house work. She denies chest pain, shortness of breath, orthopnea, PND, edema, presyncope, syncope, palpitations. EKG today with no significant abnormality.   Discussed the use of AI scribe software  for clinical note transcription with the patient, who gave verbal consent to proceed.   ROS: See HPI       Studies Reviewed: Marland Kitchen   EKG Interpretation Date/Time:  Friday October 18 2023 10:04:00 EST Ventricular Rate:  63 PR Interval:  154 QRS Duration:  70 QT Interval:  426 QTC Calculation: 435 R Axis:   66  Text Interpretation: Normal sinus rhythm Nonspecific ST abnormality When compared with ECG of  26-Sep-2023 15:27, T wave inversion no longer evident in Lateral leads Confirmed by Eligha Bridegroom [phone]) on 10/18/2023 10:11:57 AM    Risk Assessment/Calculations:     HYPERTENSION CONTROL Vitals:   10/18/23 0955 10/18/23 1505  BP: (!) 140/64 (!) 140/80    The patient's blood pressure is elevated above target today.  In order to address the patient's elevated BP: Blood pressure will be monitored at home to determine if medication changes need to be made. (Did not take anti-hypertensive medications prior to appointment)          Physical Exam:   VS:  BP (!) 140/80   Pulse 63   Resp 15   Ht 5\' 4"  (1.626 m)   Wt 146 lb (66.2 kg)   SpO2 98%   BMI 25.06 kg/m    Wt Readings from Last 3 Encounters:  10/18/23 146 lb (66.2 kg)  09/19/23 138 lb (62.6 kg)  09/05/23 138 lb 9.6 oz (62.9 kg)    GEN: Well nourished, well developed in no acute distress NECK: No JVD; No carotid bruits CARDIAC: RRR, no murmurs, rubs, gallops RESPIRATORY:  Diminished breath sounds bilaterally without rales, wheezing or rhonchi  ABDOMEN: Soft, non-tender, non-distended EXTREMITIES:  No edema; No deformity     ASSESSMENT AND PLAN: .    Hypertension: BP is elevated and remains mildly elevated on my recheck. Aldosterone/renin levels within normal limits 09/05/23. She has not taken her medications yet this morning but ensures accuracy of medication list. She does not have a home BP monitor. Will write a prescription for BP monitor and advised her to notify us if she cannot get one.  Renal function stable on labs completed 10/04/2023.  We will continue current antihypertensive therapy including irbesartan and amlodipine. Appropriate BP monitoring technique reviewed. Low-sodium diet encouraged. She has been referred to advanced hypertension clinic and has appointment in February with Dr. Duke Salvia.  Coronary artery calcification/Aortic atherosclerosis: Coronary calcification and aortic atherosclerosis seen on CT  03/2023. She deferred CT calcium score due to cost. She denies chest pain, dyspnea, or other symptoms concerning for angina. No indication for further ischemic evaluation at this time. Emphasized the importance of LDL 70 or lower.  Will have her stop atorvastatin and start high intensity rosuvastatin. Focus on secondary prevention including heart healthy mostly plant based diet avoiding saturated fat, processed foods, simple carbohydrates, and sugar along with aiming for at least 150 minutes of moderate intensity exercise each week.   Hyperlipidemia LDL goal < 70: LDL 154, total cholesterol 241, triglycerides 145, HDL 61 on 10/04/23. She was told to avoid eating meat. Reviewed heart healthy diet recommendations. Appears to have been taking atorvastatin 20 mg for many years. We will d/c atorvastatin and start rosuvastatin 40 mg daily. Will recheck fasting lipid and ALT in 2-3 months.   Hypothyroidism: Abnormal TSH and free T4 on labs 09/05/23. Seen by PCP 10/04/23 for management.  Currently on levothyroxine 50 mcg daily. Management  per PCP.       Dispo: Keep your February appointment with Dr.  [employer], Eligha Bridegroom, NP-C

## 2023-10-18 ENCOUNTER — Encounter: Payer: Self-pay | Admitting: Nurse Practitioner

## 2023-10-18 ENCOUNTER — Ambulatory Visit: Payer: Medicaid Other | Attending: Nurse Practitioner | Admitting: Nurse Practitioner

## 2023-10-18 VITALS — BP 140/80 | HR 63 | Resp 15 | Ht 64.0 in | Wt 146.0 lb

## 2023-10-18 DIAGNOSIS — I10 Essential (primary) hypertension: Secondary | ICD-10-CM | POA: Diagnosis not present

## 2023-10-18 DIAGNOSIS — I2510 Atherosclerotic heart disease of native coronary artery without angina pectoris: Secondary | ICD-10-CM

## 2023-10-18 DIAGNOSIS — Z79899 Other long term (current) drug therapy: Secondary | ICD-10-CM

## 2023-10-18 DIAGNOSIS — I700 Atherosclerosis of aorta: Secondary | ICD-10-CM

## 2023-10-18 DIAGNOSIS — E785 Hyperlipidemia, unspecified: Secondary | ICD-10-CM

## 2023-10-18 DIAGNOSIS — E039 Hypothyroidism, unspecified: Secondary | ICD-10-CM

## 2023-10-18 MED ORDER — ROSUVASTATIN CALCIUM 40 MG PO TABS: 40.0000 mg | ORAL_TABLET | Freq: Every day | ORAL | 3 refills | Status: DC

## 2023-10-18 NOTE — Telephone Encounter (Signed)
Spoke with the pt  She states that she actually does not need refills on anything today  There was a mixup at her pharmacy but she has medications now  I have scheduled her for rov with JD for Feb 2025 since she was overdue f/u  Nothing further needed

## 2023-10-18 NOTE — Patient Instructions (Signed)
Medication Instructions:   DISCONTINUE  Atorvastatin  START Rosuvastatin one (1) tablet by mouth ( 40 mg) daily.   *If you need a refill on your cardiac medications before your next appointment, please call your pharmacy*   Lab Work:  Your physician recommends that you return for a FASTING lipid profile: 2-3 months fasting after midnight. Paperwork given to pt today.    If you have labs (blood work) drawn today and your tests are completely normal, you will receive your results only by: MyChart Message (if you have MyChart) OR A paper copy in the mail If you have any lab test that is abnormal or we need to change your treatment, we will call you to review the results.   Testing/Procedures:  None ordered.   Follow-Up: At [facility], you and your health needs are our priority.  As part of our continuing mission to provide you with exceptional heart care, we have created designated Provider Care Teams.  These Care Teams include your primary Cardiologist (physician) and Advanced Practice Providers (APPs -  Physician Assistants and Nurse Practitioners) who all work together to provide you with the care you need, when you need it.  We recommend signing up for the patient portal called "MyChart".  Sign up information is provided on this After Visit Summary.  MyChart is used to connect with patients for Virtual Visits (Telemedicine).  Patients are able to view lab/test results, encounter notes, upcoming appointments, etc.  Non-urgent messages can be sent to your provider as well.   To learn more about what you can do with MyChart, go to [email or web address].    Your next appointment:   2 month(s)  Provider:   Chilton Si, MD    Other Instructions  HOW TO TAKE YOUR BLOOD PRESSURE  Rest 5 minutes before taking your blood pressure. Don't  smoke or drink caffeinated beverages for at least 30 minutes before. Take your blood pressure before (not after) you eat. Sit  comfortably with your back supported and both feet on the floor ( don't cross your legs). Elevate your arm to heart level on a table or a desk. Use the proper sized cuff.  It should fit smoothly and snugly around your bare upper arm.  There should be  Enough room to slip a fingertip under the cuff.  The bottom edge of the cuff should be 1 inch above the crease Of the elbow. Please monitor your blood pressure once daily 2 hours after your am medication. If you blood pressure Consistently remains above 140 (systolic) top number or over 80 ( diastolic) bottom number X 3 days  Consecutively.  Please call our office at [phone] or send Mychart message.     ----Avoid cold medicines with D or DM at the end of them----    Adopting a Healthy Lifestyle.   Weight: Know what a healthy weight is for you (roughly BMI <25) and aim to maintain this. You can calculate your body mass index on your smart phone. Unfortunately, this is not the most accurate measure of healthy weight, but it is the simplest measurement to use. A more accurate measurement involves body scanning which measures lean muscle, fat tissue and bony density. We do not have this equipment at [facility].    Diet: Aim for 7+ servings of fruits and vegetables daily Limit animal fats in diet for cholesterol and heart health - choose grass fed whenever available Avoid highly processed foods (fast food burgers, tacos, fried chicken, pizza, hot dogs,  french fries)  Saturated fat comes in the form of butter, lard, coconut oil, margarine, partially hydrogenated oils, and fat in meat. These increase your risk of cardiovascular disease.  Use healthy plant oils, such as olive, canola, soy, corn, sunflower and peanut.  Whole foods such as fruits, vegetables and whole grains have fiber  Men need > 38 grams of fiber per day Women need > 25 grams of fiber per day  Load up on vegetables and fruits - one-half of your plate: Aim for color and variety, and  remember that potatoes dont count. Go for whole grains - one-quarter of your plate: Whole wheat, barley, wheat berries, quinoa, oats, brown rice, and foods made with them. If you want pasta, go with whole wheat pasta. Protein power - one-quarter of your plate: Fish, chicken, beans, and nuts are all healthy, versatile protein sources. Limit red meat. You need carbohydrates for energy! The type of carbohydrate is more important than the amount. Choose carbohydrates such as vegetables, fruits, whole grains, beans, and nuts in the place of white rice, white pasta, potatoes (baked or fried), macaroni and cheese, cakes, cookies, and donuts.  If youre thirsty, drink water. Coffee and tea are good in moderation, but skip sugary drinks and limit milk and dairy products to one or two daily servings. Keep sugar intake at 6 teaspoons or 24 grams or LESS       Exercise: Aim for 150 min of moderate intensity exercise weekly for heart health, and weights twice weekly for bone health Stay active - any steps are better than no steps! Aim for 7-9 hours of sleep daily          Mediterranean Diet  Why follow it? Research shows. Those who follow the Mediterranean diet have a reduced risk of heart disease  The diet is associated with a reduced incidence of Parkinson's and Alzheimer's diseases People following the diet may have longer life expectancies and lower rates of chronic diseases  The Dietary Guidelines for Americans recommends the Mediterranean diet as an eating plan to promote health and prevent disease  What Is the Mediterranean Diet?  Healthy eating plan based on typical foods and recipes of Mediterranean-style cooking The diet is primarily a plant based diet; these foods should make up a majority of meals   Starches - Plant based foods should make up a majority of meals - They are an important sources of vitamins, minerals, energy, antioxidants, and fiber - Choose whole grains, foods high in  fiber and minimally processed items  - Typical grain sources include wheat, oats, barley, corn, brown rice, bulgar, farro, millet, polenta, couscous  - Various types of beans include chickpeas, lentils, fava beans, black beans, white beans   Fruits  Veggies - Large quantities of antioxidant rich fruits & veggies; 6 or more servings  - Vegetables can be eaten raw or lightly drizzled with oil and cooked  - Vegetables common to the traditional Mediterranean Diet include: artichokes, arugula, beets, broccoli, brussel sprouts, cabbage, carrots, celery, collard greens, cucumbers, eggplant, kale, leeks, lemons, lettuce, mushrooms, okra, onions, peas, peppers, potatoes, pumpkin, radishes, rutabaga, shallots, spinach, sweet potatoes, turnips, zucchini - Fruits common to the Mediterranean Diet include: apples, apricots, avocados, cherries, clementines, dates, figs, grapefruits, grapes, melons, nectarines, oranges, peaches, pears, pomegranates, strawberries, tangerines  Fats - Replace butter and margarine with healthy oils, such as olive oil, canola oil, and tahini  - Limit nuts to no more than a handful a day  - Nuts include walnuts,  almonds, pecans, pistachios, pine nuts  - Limit or avoid candied, honey roasted or heavily salted nuts - Olives are central to the Mediterranean diet - can be eaten whole or used in a variety of dishes   Meats Protein - Limiting red meat: no more than a few times a month - When eating red meat: choose lean cuts and keep the portion to the size of deck of cards - Eggs: approx. 0 to 4 times a week  - Fish and lean poultry: at least 2 a week  - Healthy protein sources include, chicken, [place], lean beef, lamb - Increase intake of seafood such as tuna, salmon, trout, mackerel, shrimp, scallops - Avoid or limit high fat processed meats such as sausage and bacon  Dairy - Include moderate amounts of low fat dairy products  - Focus on healthy dairy such as fat free yogurt, skim milk,  low or reduced fat cheese - Limit dairy products higher in fat such as whole or 2% milk, cheese, ice cream  Alcohol - Moderate amounts of red wine is ok  - No more than 5 oz daily for women (all ages) and men older than age 43  - No more than 10 oz of wine daily for men younger than 64  Other - Limit sweets and other desserts  - Use herbs and spices instead of salt to flavor foods  - Herbs and spices common to the traditional Mediterranean Diet include: basil, bay leaves, chives, cloves, cumin, fennel, garlic, lavender, marjoram, mint, oregano, parsley, pepper, rosemary, sage, savory, sumac, tarragon, thyme   It's not just a diet, it's a lifestyle:  The Mediterranean diet includes lifestyle factors typical of those in the region  Foods, drinks and meals are best eaten with others and savored Daily physical activity is important for overall good health This could be strenuous exercise like running and aerobics This could also be more leisurely activities such as walking, housework, yard-work, or taking the stairs Moderation is the key; a balanced and healthy diet accommodates most foods and drinks Consider portion sizes and frequency of consumption of certain foods   Meal Ideas & Options:  Breakfast:  Whole wheat toast or whole wheat English muffins with peanut butter & hard boiled egg Steel cut oats topped with apples & cinnamon and skim milk  Fresh fruit: banana, strawberries, melon, berries, peaches  Smoothies: strawberries, bananas, greek yogurt, peanut butter Low fat greek yogurt with blueberries and granola  Egg white omelet with spinach and mushrooms Breakfast couscous: whole wheat couscous, apricots, skim milk, cranberries  Sandwiches:  Hummus and grilled vegetables (peppers, zucchini, squash) on whole wheat bread   Grilled chicken on whole wheat pita with lettuce, tomatoes, cucumbers or tzatziki  [place] salad on whole wheat bread: tuna salad made with greek yogurt, olives, red  peppers, capers, green onions Garlic rosemary lamb pita: lamb sauted with garlic, rosemary, salt & pepper; add lettuce, cucumber, greek yogurt to pita - flavor with lemon juice and black pepper  Seafood:  Mediterranean grilled salmon, seasoned with garlic, basil, parsley, lemon juice and black pepper Shrimp, lemon, and spinach whole-grain pasta salad made with low fat greek yogurt  Seared scallops with lemon orzo  Seared tuna steaks seasoned salt, pepper, coriander topped with tomato mixture of olives, tomatoes, olive oil, minced garlic, parsley, green onions and cappers  Meats:  Herbed greek chicken salad with kalamata olives, cucumber, feta  Red bell peppers stuffed with spinach, bulgur, lean ground beef (or lentils) & topped  with feta   Kebabs: skewers of chicken, tomatoes, onions, zucchini, squash  [place] burgers: made with red onions, mint, dill, lemon juice, feta cheese topped with roasted red peppers Vegetarian Cucumber salad: cucumbers, artichoke hearts, celery, red onion, feta cheese, tossed in olive oil & lemon juice  Hummus and whole grain pita points with a greek salad (lettuce, tomato, feta, olives, cucumbers, red onion) Lentil soup with celery, carrots made with vegetable broth, garlic, salt and pepper  Tabouli salad: parsley, bulgur, mint, scallions, cucumbers, tomato, radishes, lemon juice, olive oil, salt and pepper.

## 2023-10-20 ENCOUNTER — Telehealth: Payer: Self-pay | Admitting: Physician Assistant

## 2023-10-20 NOTE — Telephone Encounter (Signed)
Following up overdue result notification - per 12/2 phone note patient was to have repeat BMET 12/9 when she came in for echo Do not see she got in our system, but had BMET at outside labs 10/04/23 with K 4.9, Cr 1.13 At this point I think we can stop the potassium supplement since it looks like her irbesartan is helping to keep her potassium up the last several blood draws. Can you let her know? Please call pharmacy to cancel her potassium chloride rx (patient has poor health literacy and may continue to take) Can recheck BMET 1-2 weeks off potassium to ensure stable

## 2023-10-21 NOTE — Telephone Encounter (Signed)
Called and left message for patient, no DPR on file. Asked that she call back to review needed medication changes.

## 2023-10-24 NOTE — Telephone Encounter (Signed)
Called and spoke with patient, described the pill to her (potassium) and she pulled her bottle and verified the spelling. She will stop this medication now. She states she is going to PCP tomorrow and will discuss getting repeat labs done. She is reliant on transportation services. Called and left message on provider line at pharmacy to inactivate the potassium prescription.

## 2023-10-29 ENCOUNTER — Telehealth: Payer: Self-pay | Admitting: Allergy

## 2023-10-29 MED ORDER — BREZTRI AEROSPHERE 160-9-4.8 MCG/ACT IN AERO
2.0000 | INHALATION_SPRAY | Freq: Two times a day (BID) | RESPIRATORY_TRACT | 2 refills | Status: DC
Start: 2023-10-29 — End: 2023-12-11

## 2023-10-29 MED ORDER — BENZONATATE 100 MG PO CAPS: ORAL_CAPSULE | ORAL | 0 refills | Status: DC

## 2023-10-29 NOTE — Telephone Encounter (Signed)
 Called patient - DOB/Pharmacy verified- state she has been having the following symptoms for the past on/off for the past two months but has got worse and keeping her up at night:  Cough - dry Sneezing Runny nose - clear Cold sores in nose  Patient stated Breztri  works better for her - she does like really like Spiriva , feels like it doesn't work better than Breztri .  Patient stated she hasn't tried any OTC cough/cold medicine  - stated she can't afford anything her insurance doesn't pay for due to her being on a fixed income.  Patient is requesting something for the cough - and to help her rest at night.  Patient advised message will be forwarded to provider for next step.  Patient verbalized understanding to all, no further questions.

## 2023-10-29 NOTE — Telephone Encounter (Signed)
Patient called stating she is having some issues with coughing. Patient states she can't sleep at night due to her coughing so much. The patient would like to have cough medication sent to [facility] on [address].

## 2023-10-29 NOTE — Telephone Encounter (Signed)
 Per Provider:  Is Breztri  covered with her insurance?  If so that is my preference 2 puffs twice a day.   For the cough is tessalon  perls covered?  She can take 1-2 perls (up to 200mg ) TID prn cough    Called patient - DOB verified - advised of provider notation above.  Patient verbalized understanding, no further questions.

## 2023-11-10 IMAGING — CT CT HEAD W/O CM
4 series · 16 of 47 positions shown, 18 images · non-contrast
Comparison: Head CT 09/12/2006.

CLINICAL DATA: 60-year-old female with cough, shortness of breath,
headache, neck and right ear pain. Recently diagnosed with right
otitis media and started on antibiotics.



[Series 3: head wo · axial · 0.39mm/px · z∈[-98,+8]mm · 7 of 29 slices shown, 9 images]
[im 4/29  brain]
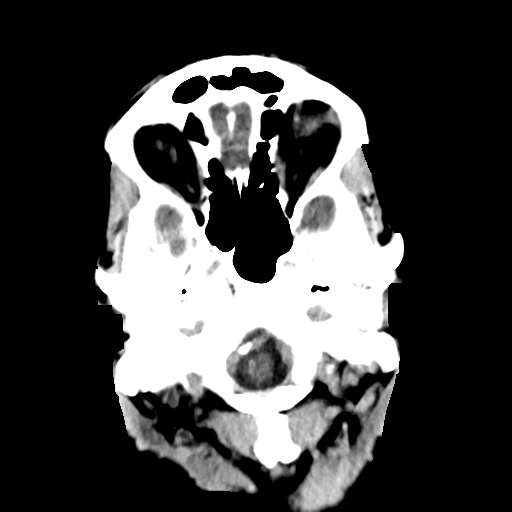
[im 4/29  bone]
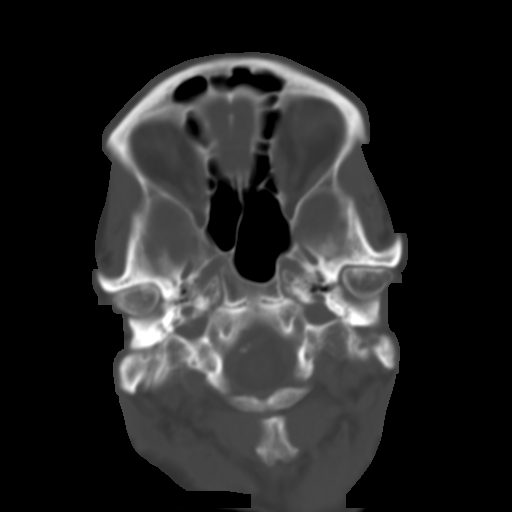
[im 8/29  brain]
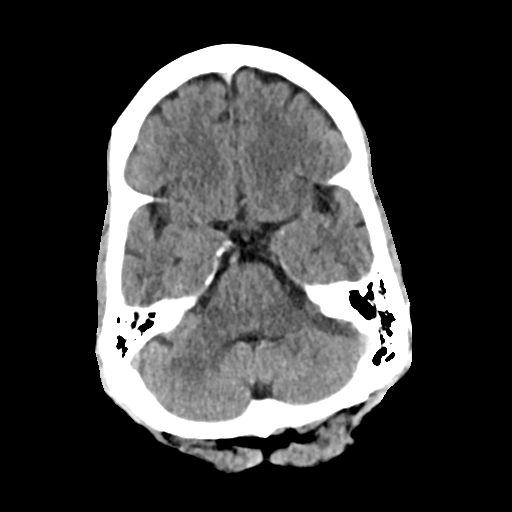
[im 11/29  brain]
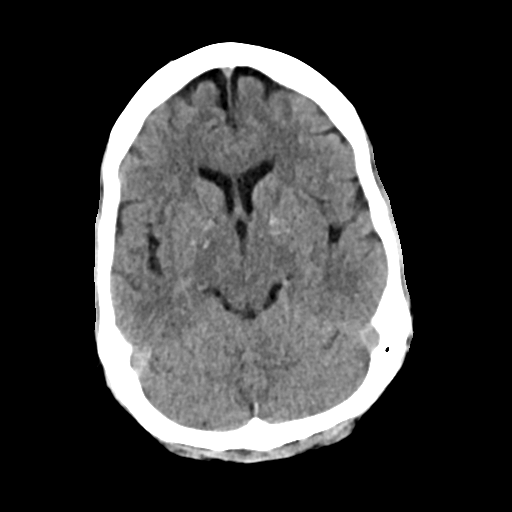
[im 15/29  brain]
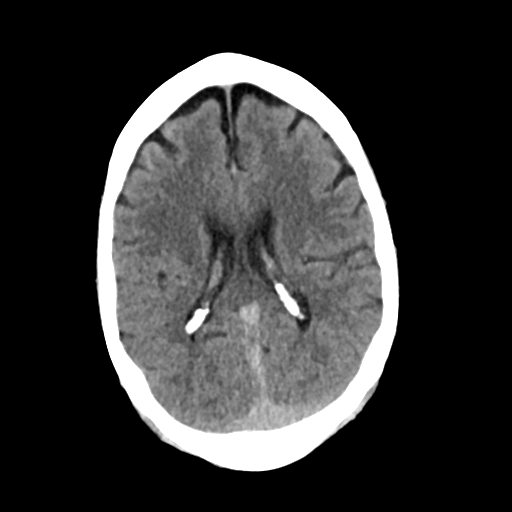
[im 18/29  brain]
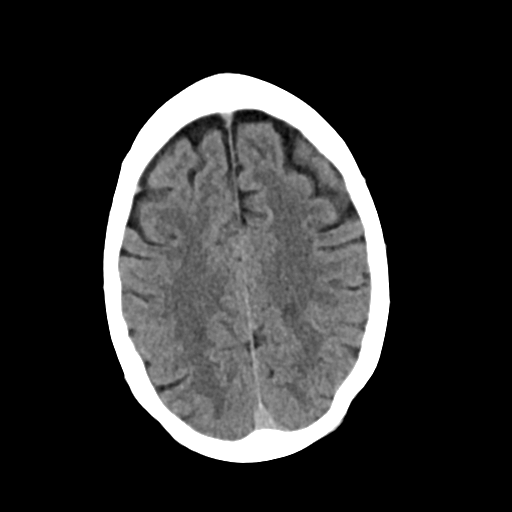
[im 18/29  bone]
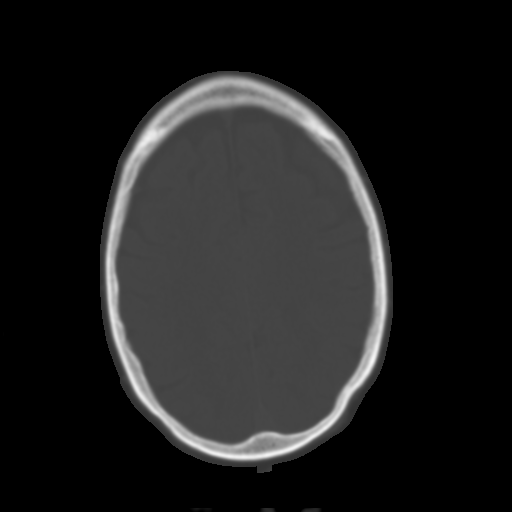
[im 22/29  brain]
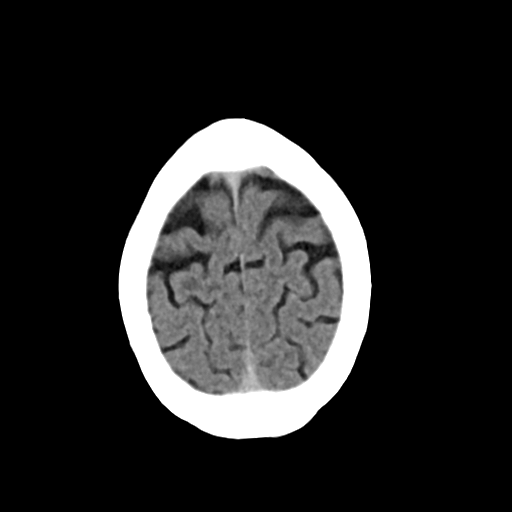
[im 25/29  brain]
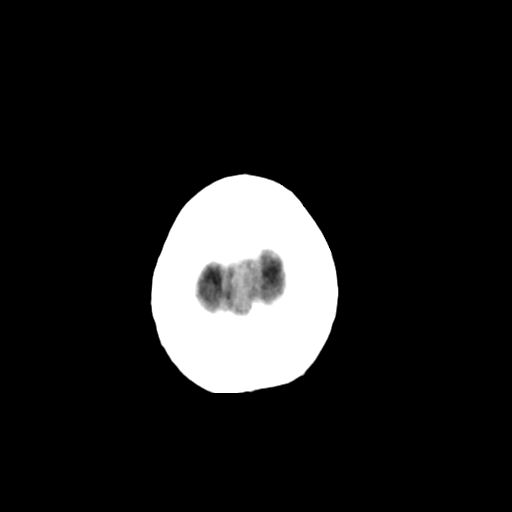

[Series 4: head bone · axial · 0.39mm/px · z∈[-98,-70]mm · 3 of 72 slices shown]
[im 8/72  bone]
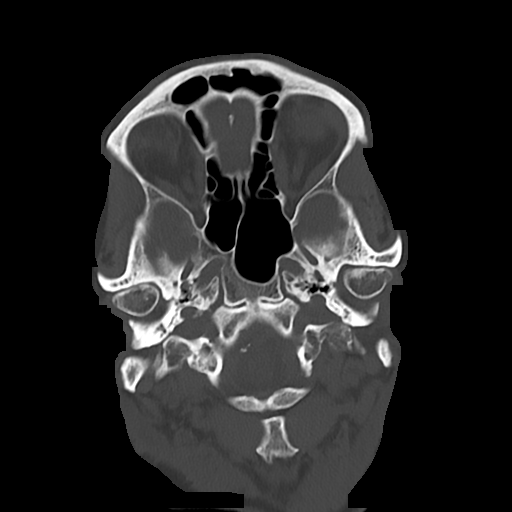
[im 15/72  bone]
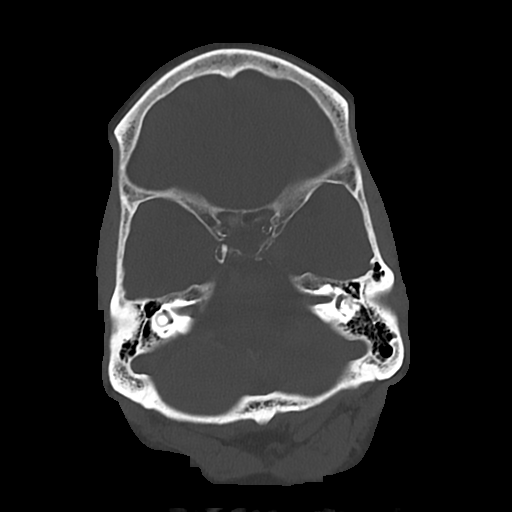
[im 22/72  bone]
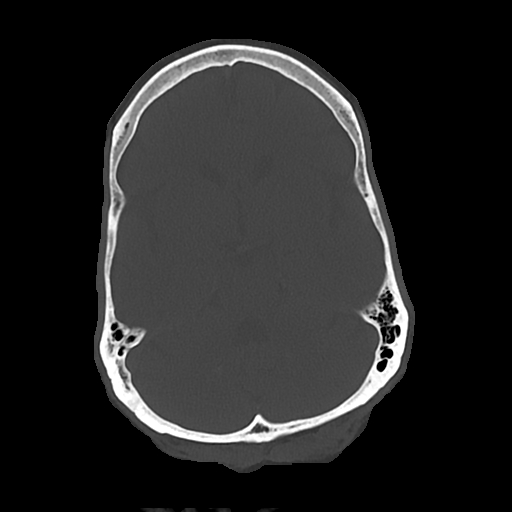

[Series 5: cor soft · coronal · 0.31mm/px · 3 of 69 slices shown]
[im 23/69  brain]
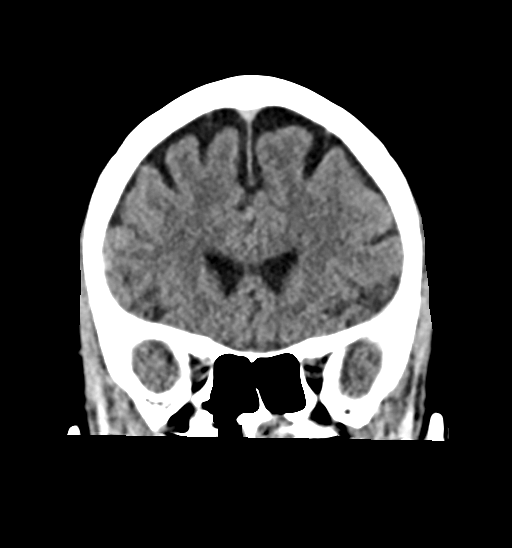
[im 31/69  brain]
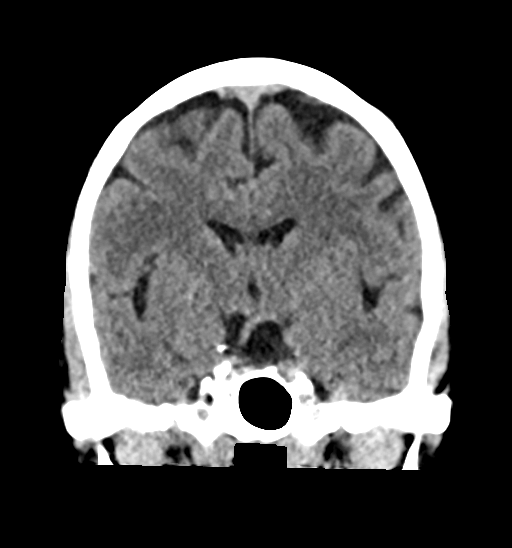
[im 38/69  brain]
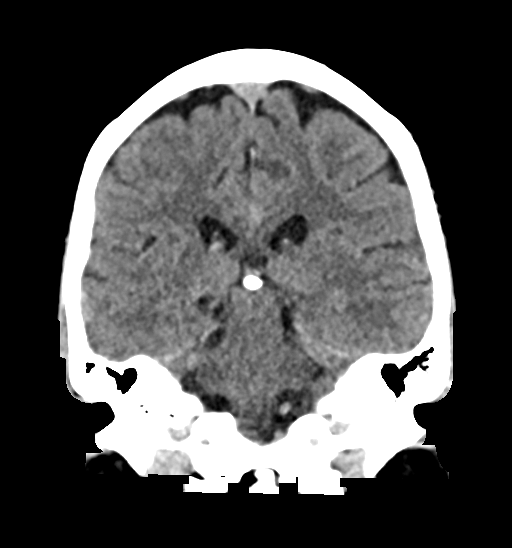

[Series 6: sag soft · sagittal · 0.33mm/px · 3 of 53 slices shown]
[im 18/53  brain]
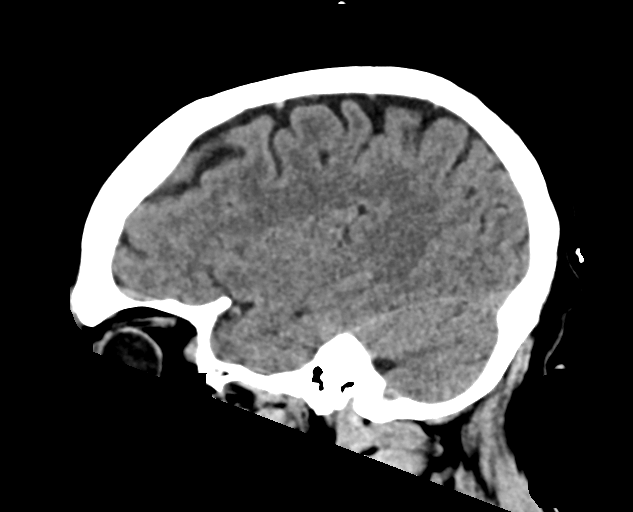
[im 27/53  brain]
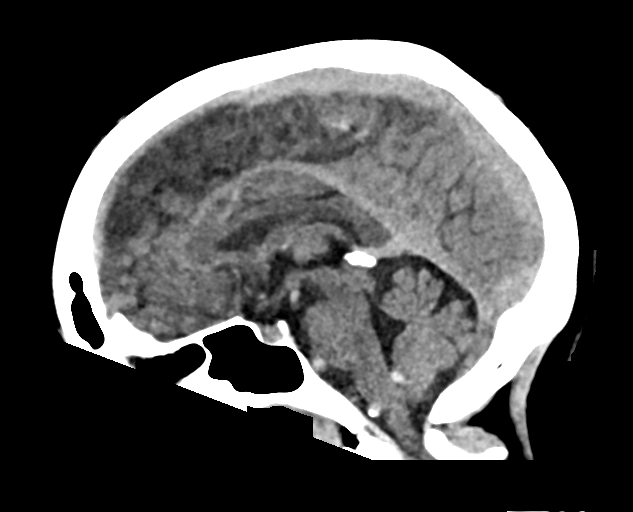
[im 35/53  brain]
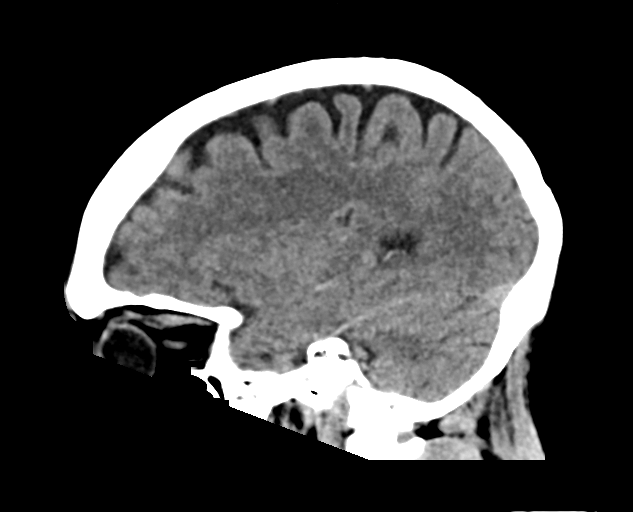

[16 of 47 positions shown; findings below may reference images not displayed]

FINDINGS: Brain: Cerebral volume is within normal limits for age. No midline
shift, ventriculomegaly, mass effect, evidence of mass lesion,
intracranial hemorrhage or evidence of cortically based acute
infarction. Gray-white matter differentiation is within normal
limits throughout the brain. Mild chronic vascular calcifications in
the basal ganglia.

Vascular: Calcified atherosclerosis at the skull base. No suspicious
intracranial vascular hyperdensity.

Skull: No acute osseous abnormality identified.

Sinuses/Orbits: Bilateral tympanic cavities are clear. Bilateral
mastoids are clear. Bilateral external auditory canals appear
normal.

Mild ethmoid sinus mucosal thickening. Other visible paranasal
sinuses are well aerated. No sinus fluid level identified.

Other: Visualized orbits and scalp soft tissues are within normal
limits.
IMPRESSION: 1. Normal for age non contrast CT appearance of the brain.
2. No evidence of otitis media, bilateral middle ears and mastoids
are clear.
3. There is mild bilateral ethmoid sinus inflammation.

## 2023-11-10 IMAGING — CR DG CHEST 1V
1 series · 1 of 1 positions shown · non-contrast
Comparison: 04/28/2021

CLINICAL DATA: Shortness of breath

EXAM:
CHEST  1 VIEW

[chest ap]
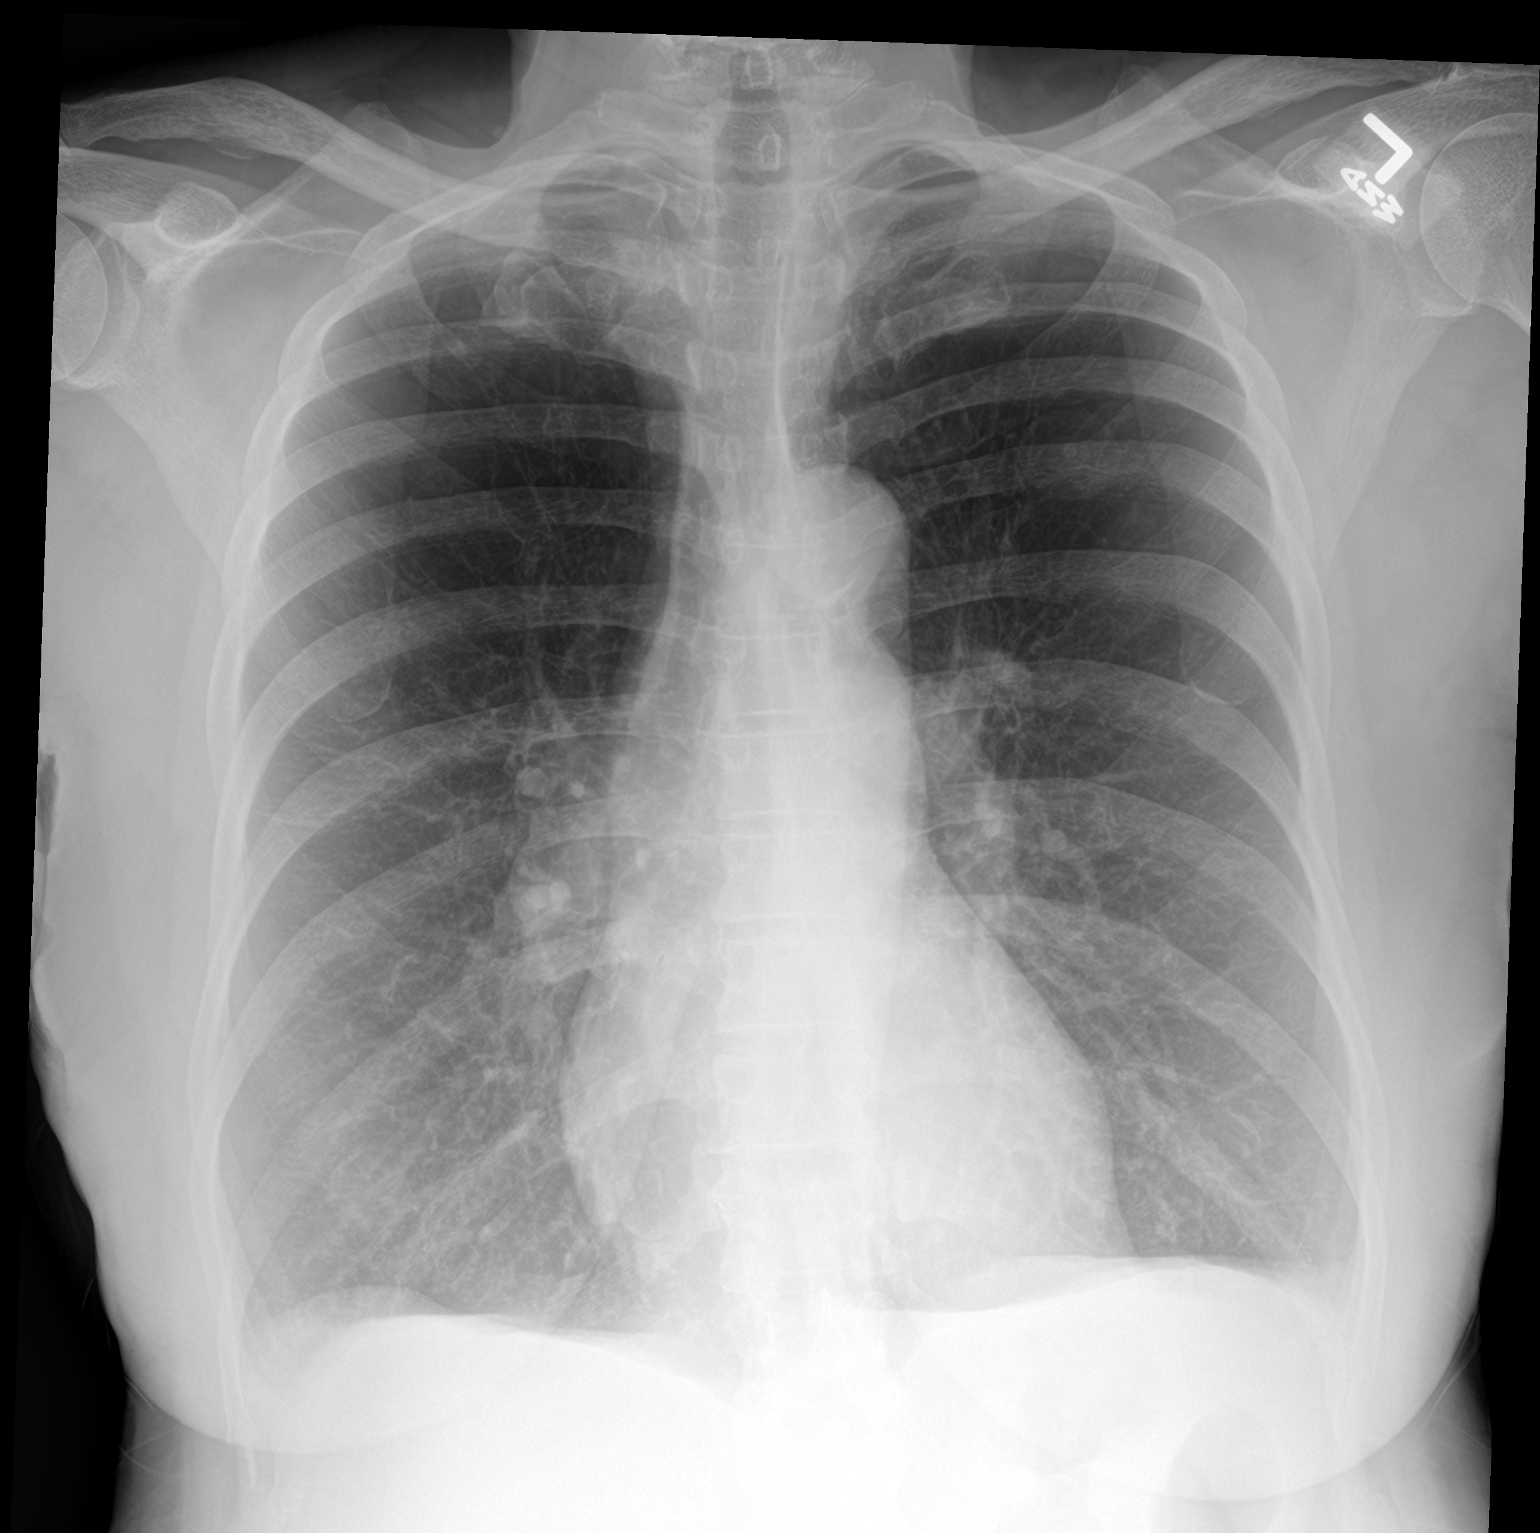

[1 of 1 positions shown; findings below may reference images not displayed]

FINDINGS: The heart size and mediastinal contours are within normal limits.
Both lungs are clear. The visualized skeletal structures are
unremarkable.
IMPRESSION: No active disease.

## 2023-11-10 IMAGING — CT CT NECK W/ CM
3 of 5 series · 11 of 33 positions shown, 13 images · IV contrast (APPLIED)
Comparison: CT head, cervical spine and CTA chest today reported
separately.

CLINICAL DATA: 60-year-old female with cough, shortness of breath,
headache, neck and right ear pain. Recently diagnosed with right
otitis media and started on antibiotics.

EXAM:
CT NECK WITH CONTRAST
TECHNIQUE: Multidetector CT imaging of the neck was performed using the
standard protocol following the bolus administration of intravenous
contrast.

[Series 5: sag neck · sagittal · 0.38mm/px · 5 of 158 slices shown, 6 images]
[im 53/158  bone]
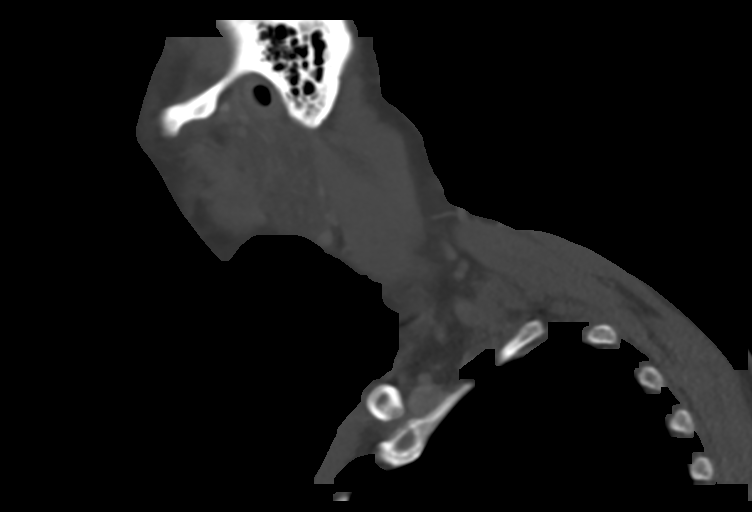
[im 66/158  bone]
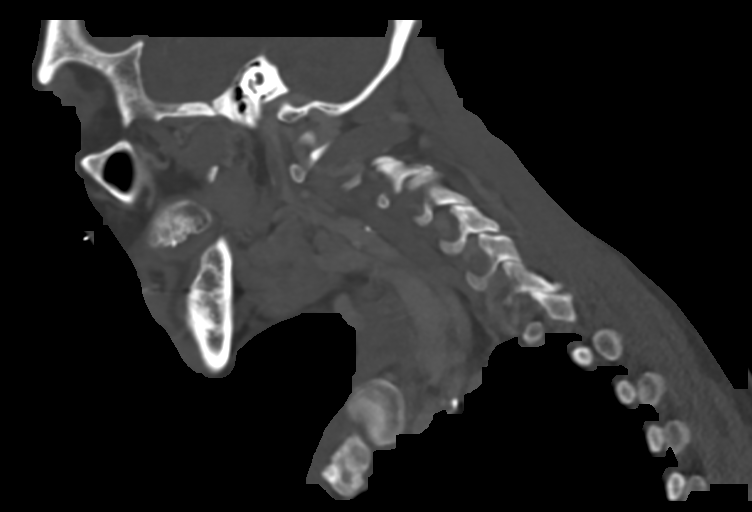
[im 79/158  soft-tissue]
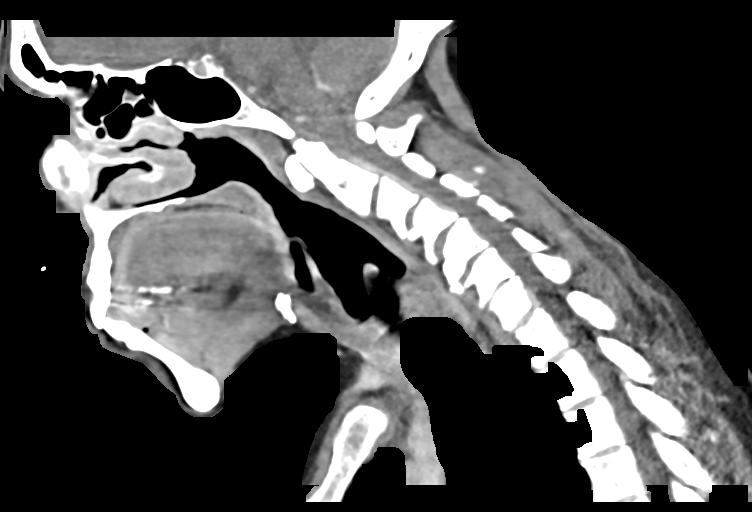
[im 79/158  bone]
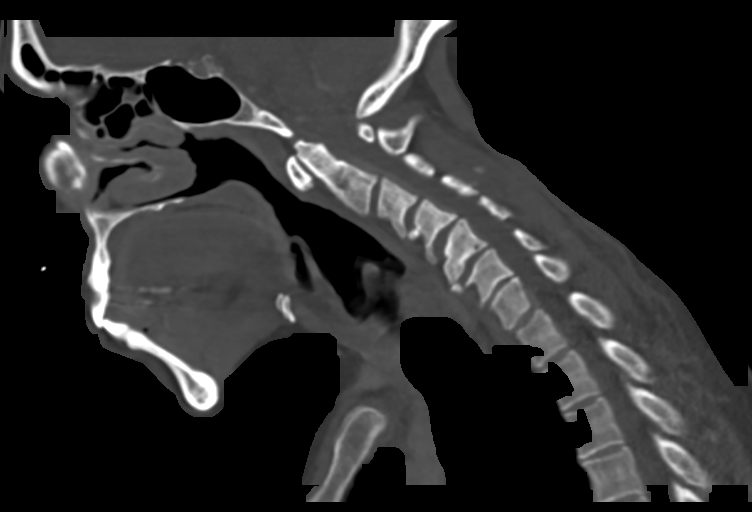
[im 92/158  bone]
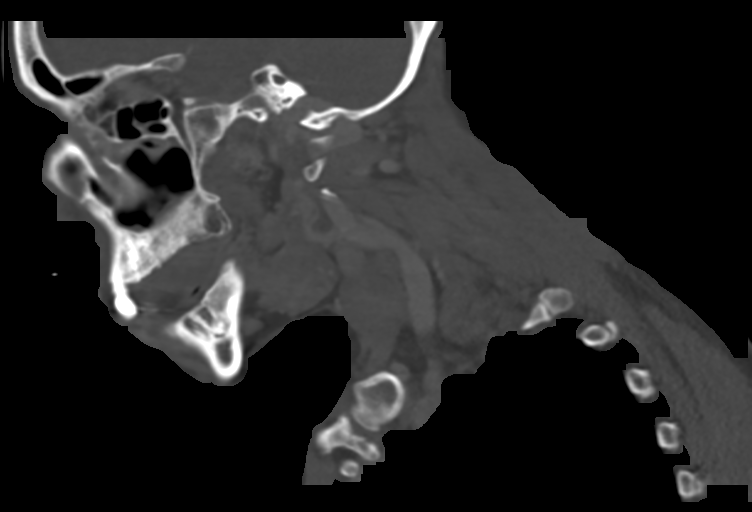
[im 105/158  bone]
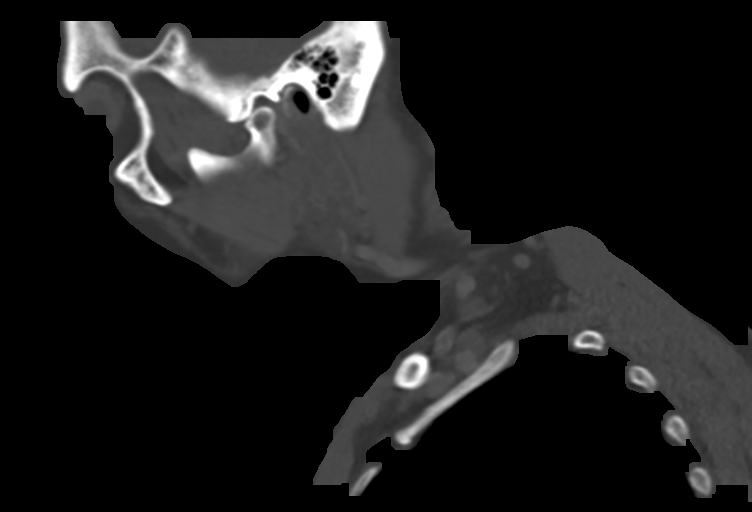

[Series 6: cor neck · coronal · 0.37mm/px · 3 of 147 slices shown]
[im 30/147  bone]
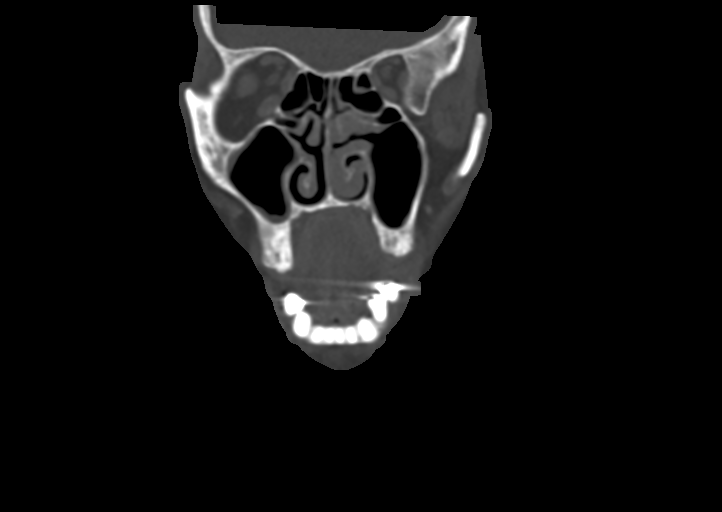
[im 59/147  bone]
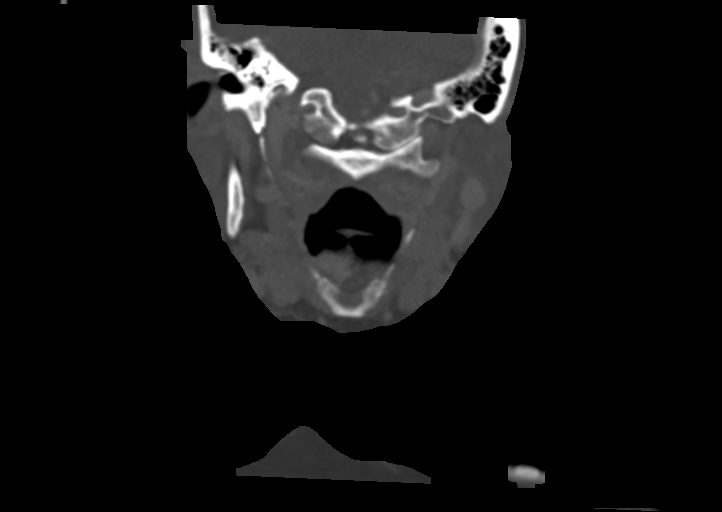
[im 88/147  bone]
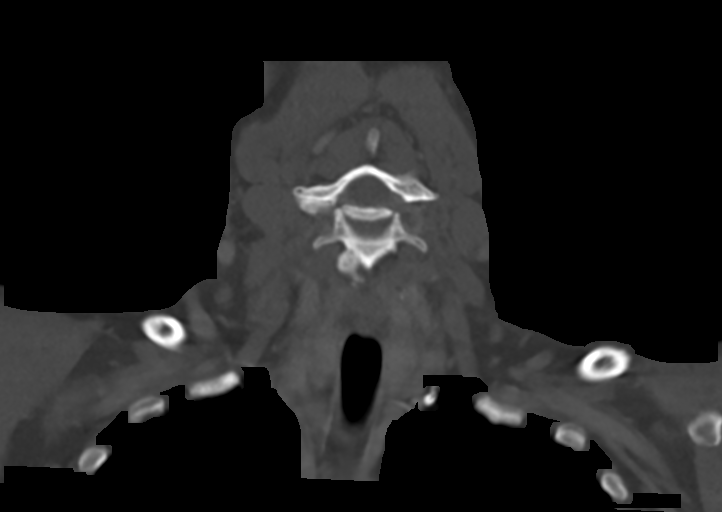

[Series 7: ax oropharynx · axial · 0.44mm/px · z∈[-238,-138]mm · 3 of 106 slices shown, 4 images]
[im 27/106  soft-tissue]
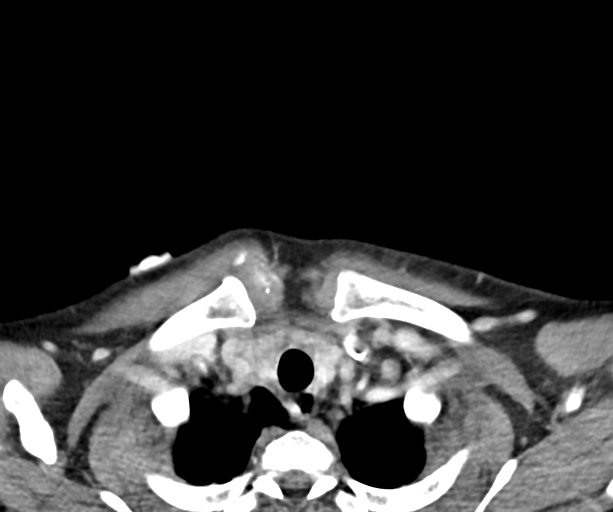
[im 27/106  bone]
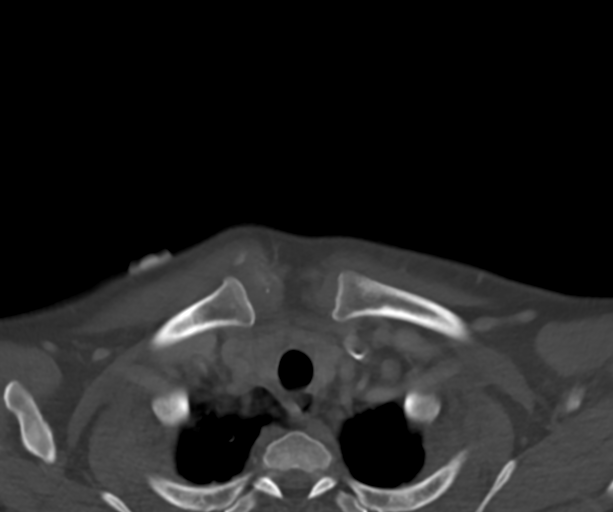
[im 53/106  bone]
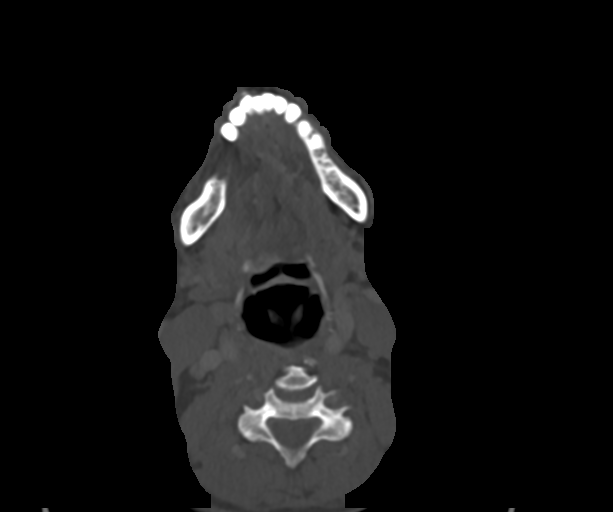
[im 79/106  bone]
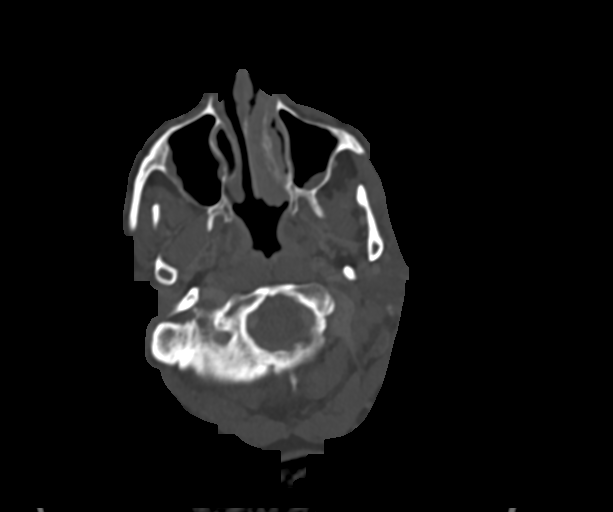

[11 of 33 positions shown; findings below may reference images not displayed]

RADIATION DOSE REDUCTION: This exam was performed according to the
departmental dose-optimization program which includes automated
exposure control, adjustment of the mA and/or kV according to
patient size and/or use of iterative reconstruction technique.

CONTRAST:  115mL OMNIPAQUE IOHEXOL 350 MG/ML SOLN
FINDINGS: Pharynx and larynx: Motion artifact at the hypopharynx. But
otherwise the pharyngeal and laryngeal soft tissue contours are
within normal limits. The pharynx is distended with gas. No
tonsillar hyperenhancement or enlargement. Parapharyngeal and
retropharyngeal spaces appear negative.

Salivary glands: Negative.

Thyroid: Negative.

Lymph nodes: Bilateral cervical lymph nodes are within normal
limits. No enlarged or heterogeneous nodes.

Vascular: Major vascular structures in the neck and at the skull
base are patent. Motion artifact at the bilateral carotid
bifurcations. Calcified atherosclerosis at the skull base.

Limited intracranial: Negative.  Sigmoid sinuses are patent.

Visualized orbits: Postoperative changes to the left globe,
otherwise negative.

Mastoids and visualized paranasal sinuses:

Bilateral tympanic cavities and mastoids are clear. External
auditory canals appear normal. Periauricular soft tissues appears
symmetric and within normal limits.

There is mild to moderate bilateral maxillary and ethmoid sinus
mucosal thickening. Trace bubbly opacity but no sinus fluid level.

Skeleton: Cervical spine detailed separately. Absent posterior
dentition. No acute osseous abnormality identified.

Upper chest: Emphysema. See chest CTA reported separately.
IMPRESSION: 1. Mild motion artifact, but no acute or inflammatory process
identified in the Neck.
2. No evidence of otitis media (bilateral middle ears and mastoids
are clear), but there is mild to moderate bilateral maxillary and
ethmoid sinus inflammation. No complicating features.
3. Emphysema ([id number]), Chest CTA reported separately.

## 2023-11-18 ENCOUNTER — Ambulatory Visit (INDEPENDENT_AMBULATORY_CARE_PROVIDER_SITE_OTHER): Payer: Medicaid Other | Admitting: Podiatry

## 2023-11-18 DIAGNOSIS — Z91199 Patient's noncompliance with other medical treatment and regimen due to unspecified reason: Secondary | ICD-10-CM

## 2023-11-18 NOTE — Progress Notes (Signed)
No show

## 2023-11-22 ENCOUNTER — Emergency Department (HOSPITAL_COMMUNITY)
Admission: EM | Admit: 2023-11-22 | Discharge: 2023-11-23 | Disposition: A | Discharge status: Home / Self Care | Payer: Medicaid Other | Attending: Emergency Medicine | Admitting: Emergency Medicine

## 2023-11-22 ENCOUNTER — Encounter (HOSPITAL_COMMUNITY): Payer: Self-pay

## 2023-11-22 ENCOUNTER — Other Ambulatory Visit: Payer: Self-pay

## 2023-11-22 DIAGNOSIS — R197 Diarrhea, unspecified: Secondary | ICD-10-CM | POA: Insufficient documentation

## 2023-11-22 DIAGNOSIS — E876 Hypokalemia: Secondary | ICD-10-CM | POA: Diagnosis not present

## 2023-11-22 DIAGNOSIS — R1011 Right upper quadrant pain: Secondary | ICD-10-CM | POA: Diagnosis not present

## 2023-11-22 DIAGNOSIS — R109 Unspecified abdominal pain: Secondary | ICD-10-CM | POA: Diagnosis present

## 2023-11-22 DIAGNOSIS — I10 Essential (primary) hypertension: Secondary | ICD-10-CM | POA: Insufficient documentation

## 2023-11-22 DIAGNOSIS — R112 Nausea with vomiting, unspecified: Secondary | ICD-10-CM | POA: Insufficient documentation

## 2023-11-22 DIAGNOSIS — J449 Chronic obstructive pulmonary disease, unspecified: Secondary | ICD-10-CM | POA: Insufficient documentation

## 2023-11-22 DIAGNOSIS — G8929 Other chronic pain: Secondary | ICD-10-CM | POA: Diagnosis not present

## 2023-11-22 DIAGNOSIS — Z79899 Other long term (current) drug therapy: Secondary | ICD-10-CM | POA: Diagnosis not present

## 2023-11-22 DIAGNOSIS — Z7951 Long term (current) use of inhaled steroids: Secondary | ICD-10-CM | POA: Diagnosis not present

## 2023-11-22 MED ORDER — FENTANYL CITRATE PF 50 MCG/ML IJ SOSY
50.0000 ug | PREFILLED_SYRINGE | INTRAMUSCULAR | Status: DC | PRN
  Administered 2023-11-23: 50 ug via INTRAVENOUS
  Filled 2023-11-22: qty 1

## 2023-11-22 NOTE — ED Triage Notes (Signed)
Pt arrived from home via POV c/o RUQ abd pain 10/10. Pt states that the pain began about a week ago this time. Pt states that her abd hurts so bad that she can't even walk.

## 2023-11-23 ENCOUNTER — Emergency Department (HOSPITAL_COMMUNITY): Payer: Medicaid Other

## 2023-11-23 LAB — URINALYSIS, ROUTINE W REFLEX MICROSCOPIC
Bacteria, UA: NONE SEEN
Bilirubin Urine: NEGATIVE
Glucose, UA: NEGATIVE mg/dL
Ketones, ur: NEGATIVE mg/dL
Leukocytes,Ua: NEGATIVE
Nitrite: NEGATIVE
Protein, ur: NEGATIVE mg/dL
Specific Gravity, Urine: 1.003 — ABNORMAL LOW (ref 1.005–1.030)
pH: 5 (ref 5.0–8.0)

## 2023-11-23 LAB — COMPREHENSIVE METABOLIC PANEL
ALT: 42 U/L (ref 0–44)
AST: 31 U/L (ref 15–41)
Albumin: 4.4 g/dL (ref 3.5–5.0)
Alkaline Phosphatase: 85 U/L (ref 38–126)
Anion gap: 7 (ref 5–15)
BUN: 15 mg/dL (ref 8–23)
CO2: 25 mmol/L (ref 22–32)
Calcium: 9.4 mg/dL (ref 8.9–10.3)
Chloride: 106 mmol/L (ref 98–111)
Creatinine, Ser: 0.88 mg/dL (ref 0.44–1.00)
GFR, Estimated: >60 mL/min (ref >60)
Glucose, Bld: 120 mg/dL — ABNORMAL HIGH (ref 70–99)
Potassium: 3 mmol/L — ABNORMAL LOW (ref 3.5–5.1)
Sodium: 138 mmol/L (ref 135–145)
Total Bilirubin: 0.7 mg/dL (ref 0.0–1.2)
Total Protein: 7.9 g/dL (ref 6.5–8.1)

## 2023-11-23 LAB — CBC
HCT: 39.5 % (ref 36.0–46.0)
Hemoglobin: 13.4 g/dL (ref 12.0–15.0)
MCH: 30.8 pg (ref 26.0–34.0)
MCHC: 33.9 g/dL (ref 30.0–36.0)
MCV: 90.8 fL (ref 80.0–100.0)
Platelets: 360 10*3/uL (ref 150–400)
RBC: 4.35 MIL/uL (ref 3.87–5.11)
RDW: 14.2 % (ref 11.5–15.5)
WBC: 7.8 10*3/uL (ref 4.0–10.5)
nRBC: 0 % (ref 0.0–0.2)

## 2023-11-23 LAB — LIPASE, BLOOD: Lipase: 29 U/L (ref 11–51)

## 2023-11-23 MED ORDER — DICYCLOMINE HCL 20 MG PO TABS
20.0000 mg | ORAL_TABLET | Freq: Two times a day (BID) | ORAL | 0 refills | Status: DC | PRN
Start: 2023-11-23 — End: 2023-12-01

## 2023-11-23 MED ORDER — ONDANSETRON HCL 4 MG/2ML IJ SOLN
4.0000 mg | Freq: Once | INTRAMUSCULAR | Status: AC
  Administered 2023-11-23: 4 mg via INTRAVENOUS
  Filled 2023-11-23: qty 2

## 2023-11-23 MED ORDER — ONDANSETRON 4 MG PO TBDP: 4.0000 mg | ORAL_TABLET | Freq: Three times a day (TID) | ORAL | 0 refills | Status: AC | PRN

## 2023-11-23 MED ORDER — HYDROMORPHONE HCL 1 MG/ML IJ SOLN
1.0000 mg | Freq: Once | INTRAMUSCULAR | Status: AC
  Administered 2023-11-23: 1 mg via INTRAVENOUS
  Filled 2023-11-23: qty 1

## 2023-11-23 MED ORDER — IOHEXOL 350 MG/ML SOLN
75.0000 mL | Freq: Once | INTRAVENOUS | Status: AC | PRN
  Administered 2023-11-23: 75 mL via INTRAVENOUS

## 2023-11-23 MED ORDER — POTASSIUM CHLORIDE 10 MEQ/100ML IV SOLN
10.0000 meq | INTRAVENOUS | Status: AC
Start: 2023-11-23 — End: 2023-11-23
  Administered 2023-11-23 (×2): 10 meq via INTRAVENOUS
  Filled 2023-11-23 (×2): qty 100

## 2023-11-23 NOTE — Discharge Instructions (Addendum)
Your evaluation in the emergency department today was reassuring.  Your CT did not reveal a concerning or emergent cause of your complaints.  Given how long you have been experiencing recurrent abdominal pain, we recommend ongoing follow-up with gastroenterology.  It appears that you were last seen in the GI office in [facility] in August 2024.  Call to schedule a follow-up appointment with this provider.  You may use Bentyl in the interim for management of persistent symptoms.

## 2023-11-23 NOTE — ED Provider Notes (Signed)
[place] EMERGENCY DEPARTMENT AT [facility] Provider Note   CSN: [id number] Arrival date & time: 11/22/23  2335     History  Chief Complaint  Patient presents with   Abdominal Pain    Mariah Foster is a 63 y.o. female.  63 year old female with a history of hypertension, hyperlipidemia, COPD, GI bleed presents to the emergency department for complaints of right sided abdominal pain.  She reports a history of similar recurrent pain over the past 3 years.  This episode began about a week ago and has been persistent and unrelieved with ibuprofen.  Feels that the pain is so severe that it makes it difficult for her to walk.  She has developed nausea as well as sporadic vomiting.  Has had a bit of diarrhea, but no melena or hematochezia.  Denies urinary symptoms, fever.  Abdominal surgical history significant for C-section x 2.  The history is provided by the patient. No language interpreter was used.  Abdominal Pain      Home Medications Prior to Admission medications   Medication Sig Start Date End Date Taking? Authorizing Provider  dicyclomine (BENTYL) 20 MG tablet Take 1 tablet (20 mg total) by mouth every 12 (twelve) hours as needed (for abdominal pain/cramping). 11/23/23  Yes Antony Madura, PA-C  ondansetron (ZOFRAN-ODT) 4 MG disintegrating tablet Take 1 tablet (4 mg total) by mouth every 8 (eight) hours as needed for nausea or vomiting. 11/23/23  Yes Antony Madura, PA-C  albuterol (PROVENTIL) (2.5 MG/3ML) 0.083% nebulizer solution Take 3 mLs (2.5 mg total) by nebulization 3 (three) times daily as needed for wheezing or shortness of breath. 08/29/23   Marcelyn Bruins, MD  albuterol (VENTOLIN HFA) 108 (90 Base) MCG/ACT inhaler Inhale 2 puffs into the lungs every 6 (six) hours as needed for wheezing or shortness of breath. 08/29/23   Marcelyn Bruins, MD  amLODipine (NORVASC) 5 MG tablet TAKE 1 TABLET(5 MG) BY MOUTH DAILY 10/02/23   Dunn, Tacey Ruiz, PA-C   azelastine (ASTELIN) 0.1 % nasal spray Place 2 sprays into both nostrils 2 (two) times daily. Use in each nostril as directed    [provider]  benzonatate (TESSALON) 100 MG capsule Take 1 to 2 capsules (up to 200 mg) 3(THREE) Times a day as needed for cough. 10/29/23   Marcelyn Bruins, MD  BREZTRI AEROSPHERE 160-9-4.8 MCG/ACT AERO SMARTSIG:2 Puff(s) Via Inhaler Morning-Night 09/09/23   [provider]  Budeson-Glycopyrrol-Formoterol (BREZTRI AEROSPHERE) 160-9-4.8 MCG/ACT AERO Inhale 2 puffs into the lungs in the morning and at bedtime. 10/29/23   Marcelyn Bruins, MD  fluticasone-salmeterol (ADVAIR HFA) [phone] MCG/ACT inhaler Inhale 2 puffs into the lungs 2 (two) times daily. 05/29/23   Martina Sinner, MD  irbesartan (AVAPRO) 150 MG tablet Take 1 tablet (150 mg total) by mouth daily. 09/06/23   Dunn, Tacey Ruiz, PA-C  levocetirizine (XYZAL) 5 MG tablet Take 5 mg by mouth every evening.    [provider]  levothyroxine (SYNTHROID) 25 MCG tablet Take 25 mcg by mouth daily before breakfast. 10/07/23   [provider]  montelukast (SINGULAIR) 10 MG tablet Take 10 mg by mouth at bedtime.    [provider]  omeprazole (PRILOSEC) 20 MG capsule Take 1 capsule (20 mg total) by mouth daily. 09/26/23 10/26/23  Janyth Pupa, MD  rosuvastatin (CRESTOR) 40 MG tablet Take 1 tablet (40 mg total) by mouth daily. 10/18/23 01/16/24  Swinyer, Zachary George, NP  tiotropium (SPIRIVA) 18 MCG inhalation capsule Place  18 mcg into inhaler and inhale daily.    [provider]  tiZANidine (ZANAFLEX) 4 MG tablet Take 4 mg by mouth every 8 (eight) hours as needed. 10/04/23   [provider]  triamcinolone cream (KENALOG) 0.1 % Apply 1 Application topically as needed.    [provider]  valACYclovir (VALTREX) 1000 MG tablet Take 1,000 mg by mouth daily. For 5 days    [provider]  potassium chloride (K-DUR) 10 MEQ tablet Take 1  tablet (10 mEq total) by mouth daily. 03/14/17 05/26/19  Maxwell Caul, PA-C      Allergies    Flagyl [metronidazole] and Metronidazole hcl    Review of Systems   Review of Systems  Gastrointestinal:  Positive for abdominal pain.  Ten systems reviewed and are negative for acute change, except as noted in the HPI.    Physical Exam Updated Vital Signs BP 135/79   Pulse 62   Temp 99.5 F (37.5 C) (Oral)   Resp 20   Ht 5\' 4"  (1.626 m)   Wt 63.5 kg   SpO2 92%   BMI 24.03 kg/m   Physical Exam Vitals and nursing note reviewed.  Constitutional:      General: She is not in acute distress.    Appearance: She is well-developed. She is not diaphoretic.     Comments: Nontoxic appearing AA female  HENT:     Head: Normocephalic and atraumatic.  Eyes:     General: No scleral icterus.    Conjunctiva/sclera: Conjunctivae normal.  Cardiovascular:     Rate and Rhythm: Normal rate and regular rhythm.     Pulses: Normal pulses.  Pulmonary:     Effort: Pulmonary effort is normal. No respiratory distress.     Comments: Respirations even and unlabored Abdominal:     Palpations: Abdomen is soft. There is no mass.     Tenderness: There is abdominal tenderness.     Comments: TTP along the right side of the abdomen as well as the right flank. Distractible during exam. No peritoneal signs or palpable masses.  Musculoskeletal:        General: Normal range of motion.     Cervical back: Normal range of motion.  Skin:    General: Skin is warm and dry.     Coloration: Skin is not pale.     Findings: No erythema or rash.  Neurological:     Mental Status: She is alert and oriented to person, place, and time.     Coordination: Coordination normal.  Psychiatric:        Behavior: Behavior normal.     ED Results / Procedures / Treatments   Labs (all labs ordered are listed, but only abnormal results are displayed) Labs Reviewed  COMPREHENSIVE METABOLIC PANEL - Abnormal; Notable for the  following components:      Result Value   Potassium 3.0 (*)    Glucose, Bld 120 (*)    All other components within normal limits  URINALYSIS, ROUTINE W REFLEX MICROSCOPIC - Abnormal; Notable for the following components:   Color, Urine COLORLESS (*)    Specific Gravity, Urine 1.003 (*)    Hgb urine dipstick SMALL (*)    All other components within normal limits  LIPASE, BLOOD  CBC    EKG None  Radiology CT ABDOMEN PELVIS W CONTRAST Result Date: 11/23/2023 CLINICAL DATA:  Acute abdominal pain EXAM: CT ABDOMEN AND PELVIS WITH CONTRAST TECHNIQUE: Multidetector CT imaging of the abdomen and pelvis was  performed using the standard protocol following bolus administration of intravenous contrast. RADIATION DOSE REDUCTION: This exam was performed according to the departmental dose-optimization program which includes automated exposure control, adjustment of the mA and/or kV according to patient size and/or use of iterative reconstruction technique. CONTRAST:  75mL OMNIPAQUE IOHEXOL 350 MG/ML SOLN COMPARISON:  Abdominal ultrasound 09/18/2023. CT of the abdomen and pelvis 09/10/2022. FINDINGS: Lower chest: No acute abnormality. Hepatobiliary: There is a cyst in the left lobe of the liver measuring up to 15 mm, unchanged. Gallbladder and bile ducts are within normal limits. Pancreas: Unremarkable. No pancreatic ductal dilatation or surrounding inflammatory changes. Spleen: Normal in size without focal abnormality. Adrenals/Urinary Tract: There is a rounded hypodensity in the left kidney which is too small to characterize and unchanged. Otherwise, the kidneys, adrenal glands and bladder are within normal limits. Stomach/Bowel: Stomach is within normal limits. Appendix appears normal. No evidence of bowel wall thickening, distention, or inflammatory changes. Vascular/Lymphatic: Aortic atherosclerosis. No enlarged abdominal or pelvic lymph nodes. Reproductive: Uterus and bilateral adnexa are unremarkable.  Other: No abdominal wall hernia or abnormality. No abdominopelvic ascites. Musculoskeletal: There are degenerative changes of the lower lumbar spine. IMPRESSION: 1. No acute localizing process in the abdomen or pelvis. 2. Stable hepatic cyst. 3. Subcentimeter hypodensity in the left kidney is too small to characterize, but favored as cysts. No follow-up imaging recommended at this time. Aortic Atherosclerosis (ICD10-I70.0). Electronically Signed   By: Darliss Cheney M.D.   On: 11/23/2023 03:28    Procedures Procedures    Medications Ordered in ED Medications  potassium chloride 10 mEq in 100 mL IVPB (0 mEq Intravenous Stopped 11/23/23 0437)  HYDROmorphone (DILAUDID) injection 1 mg (1 mg Intravenous Given 11/23/23 0207)  ondansetron (ZOFRAN) injection 4 mg (4 mg Intravenous Given 11/23/23 0207)  iohexol (OMNIPAQUE) 350 MG/ML injection 75 mL (75 mLs Intravenous Contrast Given 11/23/23 0305)    ED Course/ Medical Decision Making/ A&P Clinical Course as of 11/23/23 0501  Sat Nov 23, 2023  0431 Per chart review, seen by GI at [facility] in August 2024 for this complaint. Outpatient visit chronicles hx of recurrent pain since 2021. Her work up today is noncontributory and without acute/emergent process. Patient stable for discharge to follow up with [facility] GI doctors as needed. [KH]    Clinical Course User Index [KH] Antony Madura, PA-C                                 Medical Decision Making Amount and/or Complexity of Data Reviewed Labs: ordered. Radiology: ordered.  Risk Prescription drug management.   This patient presents to the ED for concern of recurrent RUQ abdominal pain, this involves an extensive number of treatment options, and is a complaint that carries with it a high risk of complications and morbidity.  The differential diagnosis includes constipation vs biliary colic vs hepatitis vs PNA vs pleural effusion vs choledocholithiasis vs pSBO/SBO vs ruptured viscous.   Co morbidities  that complicate the patient evaluation  HTN HLD COPD   Additional history obtained:  External records from outside source obtained and reviewed including negative RUQ ultrasound in November 2024.   Lab Tests:  I Ordered, and personally interpreted labs.  The pertinent results include:  K 3.0. Labs otherwise WNL.   Imaging Studies ordered:  I ordered imaging studies including CT abd/pelvis  I independently visualized and interpreted imaging which showed no acute process in the abdomen or  pelvis. I agree with the radiologist interpretation   Cardiac Monitoring:  The patient was maintained on a cardiac monitor.  I personally viewed and interpreted the cardiac monitored which showed an underlying rhythm of: NSR   Medicines ordered and prescription drug management:  I ordered medication including Dilaudid for pain, Zofran for nausea, and IV potassium for hypokalemia.  Reevaluation of the patient after these medicines showed that the patient improved I have reviewed the patients home medicines and have made adjustments as needed   Test Considered:  UDS   Problem List / ED Course:  As above   Reevaluation:  After the interventions noted above, I reevaluated the patient and found that they have :improved   Social Determinants of Health:  Lives independently    Dispostion:  After consideration of the diagnostic results and the patients response to treatment, I feel that the patent would benefit from supportive care and ongoing outpatient GI evaluation. Referred back to GI provider which patient saw in August 2024. Return precautions discussed and provided. Patient discharged in stable condition with no unaddressed concerns.          Final Clinical Impression(s) / ED Diagnoses Final diagnoses:  Chronic abdominal pain  Hypokalemia    Rx / DC Orders ED Discharge Orders          Ordered    dicyclomine (BENTYL) 20 MG tablet  Every 12 hours PRN         11/23/23 0433    ondansetron (ZOFRAN-ODT) 4 MG disintegrating tablet  Every 8 hours PRN        11/23/23 0438              Antony Madura, PA-C 11/23/23 0504    Nira Conn, MD 11/25/23 [phone]

## 2023-11-23 NOTE — ED Notes (Signed)
Patient transported to CT

## 2023-12-01 ENCOUNTER — Emergency Department (HOSPITAL_COMMUNITY): Payer: Medicaid Other

## 2023-12-01 ENCOUNTER — Emergency Department (HOSPITAL_COMMUNITY)
Admission: EM | Admit: 2023-12-01 | Discharge: 2023-12-01 | Disposition: A | Discharge status: Home / Self Care | Payer: Medicaid Other | Attending: Emergency Medicine | Admitting: Emergency Medicine

## 2023-12-01 ENCOUNTER — Other Ambulatory Visit: Payer: Self-pay

## 2023-12-01 ENCOUNTER — Encounter (HOSPITAL_COMMUNITY): Payer: Self-pay

## 2023-12-01 DIAGNOSIS — R112 Nausea with vomiting, unspecified: Secondary | ICD-10-CM | POA: Insufficient documentation

## 2023-12-01 DIAGNOSIS — R109 Unspecified abdominal pain: Secondary | ICD-10-CM | POA: Diagnosis present

## 2023-12-01 DIAGNOSIS — R300 Dysuria: Secondary | ICD-10-CM | POA: Diagnosis not present

## 2023-12-01 DIAGNOSIS — R1084 Generalized abdominal pain: Secondary | ICD-10-CM | POA: Diagnosis not present

## 2023-12-01 DIAGNOSIS — J449 Chronic obstructive pulmonary disease, unspecified: Secondary | ICD-10-CM | POA: Diagnosis not present

## 2023-12-01 DIAGNOSIS — Z7951 Long term (current) use of inhaled steroids: Secondary | ICD-10-CM | POA: Diagnosis not present

## 2023-12-01 LAB — CBC WITH DIFFERENTIAL/PLATELET
Abs Immature Granulocytes: 0.01 10*3/uL (ref 0.00–0.07)
Basophils Absolute: 0.1 10*3/uL (ref 0.0–0.1)
Basophils Relative: 1 %
Eosinophils Absolute: 0.3 10*3/uL (ref 0.0–0.5)
Eosinophils Relative: 5 %
HCT: 38.9 % (ref 36.0–46.0)
Hemoglobin: 12.9 g/dL (ref 12.0–15.0)
Immature Granulocytes: 0 %
Lymphocytes Relative: 36 %
Lymphs Abs: 2.7 10*3/uL (ref 0.7–4.0)
MCH: 30.9 pg (ref 26.0–34.0)
MCHC: 33.2 g/dL (ref 30.0–36.0)
MCV: 93.3 fL (ref 80.0–100.0)
Monocytes Absolute: 0.6 10*3/uL (ref 0.1–1.0)
Monocytes Relative: 9 %
Neutro Abs: 3.8 10*3/uL (ref 1.7–7.7)
Neutrophils Relative %: 49 %
Platelets: 343 10*3/uL (ref 150–400)
RBC: 4.17 MIL/uL (ref 3.87–5.11)
RDW: 14.7 % (ref 11.5–15.5)
WBC: 7.6 10*3/uL (ref 4.0–10.5)
nRBC: 0 % (ref 0.0–0.2)

## 2023-12-01 LAB — COMPREHENSIVE METABOLIC PANEL
ALT: 29 U/L (ref 0–44)
AST: 24 U/L (ref 15–41)
Albumin: 4 g/dL (ref 3.5–5.0)
Alkaline Phosphatase: 93 U/L (ref 38–126)
Anion gap: 9 (ref 5–15)
BUN: 29 mg/dL — ABNORMAL HIGH (ref 8–23)
CO2: 27 mmol/L (ref 22–32)
Calcium: 9.1 mg/dL (ref 8.9–10.3)
Chloride: 106 mmol/L (ref 98–111)
Creatinine, Ser: 1.27 mg/dL — ABNORMAL HIGH (ref 0.44–1.00)
GFR, Estimated: 47 mL/min — ABNORMAL LOW (ref >60)
Glucose, Bld: 103 mg/dL — ABNORMAL HIGH (ref 70–99)
Potassium: 3.4 mmol/L — ABNORMAL LOW (ref 3.5–5.1)
Sodium: 142 mmol/L (ref 135–145)
Total Bilirubin: 1.1 mg/dL (ref 0.0–1.2)
Total Protein: 6.9 g/dL (ref 6.5–8.1)

## 2023-12-01 LAB — URINALYSIS, ROUTINE W REFLEX MICROSCOPIC
Bilirubin Urine: NEGATIVE
Glucose, UA: NEGATIVE mg/dL
Hgb urine dipstick: NEGATIVE
Ketones, ur: NEGATIVE mg/dL
Leukocytes,Ua: NEGATIVE
Nitrite: NEGATIVE
Protein, ur: 30 mg/dL — AB
Specific Gravity, Urine: >1.03 — ABNORMAL HIGH (ref 1.005–1.030)
pH: 6 (ref 5.0–8.0)

## 2023-12-01 LAB — URINALYSIS, MICROSCOPIC (REFLEX): Bacteria, UA: NONE SEEN

## 2023-12-01 LAB — LIPASE, BLOOD: Lipase: 27 U/L (ref 11–51)

## 2023-12-01 MED ORDER — ONDANSETRON HCL 4 MG/2ML IJ SOLN
4.0000 mg | Freq: Once | INTRAMUSCULAR | Status: AC
  Administered 2023-12-01: 4 mg via INTRAVENOUS
  Filled 2023-12-01: qty 2

## 2023-12-01 MED ORDER — MORPHINE SULFATE (PF) 4 MG/ML IV SOLN
4.0000 mg | Freq: Once | INTRAVENOUS | Status: AC
Start: 2023-12-01 — End: 2023-12-01
  Administered 2023-12-01: 4 mg via INTRAVENOUS
  Filled 2023-12-01: qty 1

## 2023-12-01 MED ORDER — SODIUM CHLORIDE 0.9 % IV BOLUS
1000.0000 mL | Freq: Once | INTRAVENOUS | Status: AC
  Administered 2023-12-01: 1000 mL via INTRAVENOUS

## 2023-12-01 MED ORDER — IOHEXOL 350 MG/ML SOLN
75.0000 mL | Freq: Once | INTRAVENOUS | Status: AC | PRN
  Administered 2023-12-01: 75 mL via INTRAVENOUS

## 2023-12-01 MED ORDER — DICYCLOMINE HCL 20 MG PO TABS: 20.0000 mg | ORAL_TABLET | Freq: Two times a day (BID) | ORAL | 0 refills | Status: AC

## 2023-12-01 NOTE — ED Provider Triage Note (Signed)
Emergency Medicine Provider Triage Evaluation Note  Mariah Foster , a 63 y.o. female  was evaluated in triage.  Pt complains of right flank pain, abdominal pain. Symptoms ongoing x 2 weeks, seen here for same, no diagnosis found despite labs and imaging.  Pain has been constant since she was discharged.  Endorses dysuria as well.  Notes that she was having nausea and vomiting but this has since subsided.  Review of Systems  Positive:  Negative:   Physical Exam  BP (!) 145/75   Pulse 78   Temp (!) 97.4 F (36.3 C)   Resp (!) 24   Ht 5\' 4"  (1.626 m)   Wt 61.2 kg   SpO2 99%   BMI 23.17 kg/m  Gen:   Awake, no distress   Resp:  Normal effort  MSK:   Moves extremities without difficulty  Other:  Right sided abd pain  Medical Decision Making  Medically screening exam initiated at 3:11 PM.  Appropriate orders placed.  Mariah Foster was informed that the remainder of the evaluation will be completed by another provider, this initial triage assessment does not replace that evaluation, and the importance of remaining in the ED until their evaluation is complete.     Silva Bandy, PA-C 12/01/23 1512

## 2023-12-01 NOTE — ED Triage Notes (Signed)
Pt c.o 2 weeks of right lower back pain that radiates to her flank and abd with some n/v, seen here prior for same

## 2023-12-01 NOTE — ED Provider Notes (Signed)
[place] EMERGENCY DEPARTMENT AT [facility] Provider Note   CSN: [id number] Arrival date & time: 12/01/23  1412     History  Chief Complaint  Patient presents with   Abdominal Pain   Back Pain    Mariah Foster is a 63 y.o. female for evaluation of right sided abdominal pain.  Is been ongoing for 2 weeks.  Was seen initially at symptom onset.  Pain is constant.  Goes into her right flank.  Nausea that vomiting.  She has had some dysuria as well.  No fever, chest pain, shortness of breath, cough.  Pain not worse with food intake, deep breathing.  No history of kidney stones.  No hematuria.  Unsure last colonoscopy.  Did states she had a small polyp removed.  She is having no blood in her stools.  States she had taken the medication prescribed to her in a previous ED visit however lost the medication.  HPI     Home Medications Prior to Admission medications   Medication Sig Start Date End Date Taking? Authorizing Provider  dicyclomine (BENTYL) 20 MG tablet Take 1 tablet (20 mg total) by mouth 2 (two) times daily. 12/01/23  Yes Willy Vorce A, PA-C  albuterol (PROVENTIL) (2.5 MG/3ML) 0.083% nebulizer solution Take 3 mLs (2.5 mg total) by nebulization 3 (three) times daily as needed for wheezing or shortness of breath. 08/29/23   Marcelyn Bruins, MD  albuterol (VENTOLIN HFA) 108 (90 Base) MCG/ACT inhaler Inhale 2 puffs into the lungs every 6 (six) hours as needed for wheezing or shortness of breath. 08/29/23   Marcelyn Bruins, MD  amLODipine (NORVASC) 5 MG tablet TAKE 1 TABLET(5 MG) BY MOUTH DAILY 10/02/23   Dunn, Tacey Ruiz, PA-C  azelastine (ASTELIN) 0.1 % nasal spray Place 2 sprays into both nostrils 2 (two) times daily. Use in each nostril as directed    [provider]  benzonatate (TESSALON) 100 MG capsule Take 1 to 2 capsules (up to 200 mg) 3(THREE) Times a day as needed for cough. 10/29/23   Marcelyn Bruins, MD  BREZTRI AEROSPHERE  160-9-4.8 MCG/ACT AERO SMARTSIG:2 Puff(s) Via Inhaler Morning-Night 09/09/23   [provider]  Budeson-Glycopyrrol-Formoterol (BREZTRI AEROSPHERE) 160-9-4.8 MCG/ACT AERO Inhale 2 puffs into the lungs in the morning and at bedtime. 10/29/23   Marcelyn Bruins, MD  fluticasone-salmeterol (ADVAIR HFA) [phone] MCG/ACT inhaler Inhale 2 puffs into the lungs 2 (two) times daily. 05/29/23   Martina Sinner, MD  irbesartan (AVAPRO) 150 MG tablet Take 1 tablet (150 mg total) by mouth daily. 09/06/23   Dunn, Tacey Ruiz, PA-C  levocetirizine (XYZAL) 5 MG tablet Take 5 mg by mouth every evening.    [provider]  levothyroxine (SYNTHROID) 25 MCG tablet Take 25 mcg by mouth daily before breakfast. 10/07/23   [provider]  montelukast (SINGULAIR) 10 MG tablet Take 10 mg by mouth at bedtime.    [provider]  omeprazole (PRILOSEC) 20 MG capsule Take 1 capsule (20 mg total) by mouth daily. 09/26/23 10/26/23  Janyth Pupa, MD  ondansetron (ZOFRAN-ODT) 4 MG disintegrating tablet Take 1 tablet (4 mg total) by mouth every 8 (eight) hours as needed for nausea or vomiting. 11/23/23   Antony Madura, PA-C  rosuvastatin (CRESTOR) 40 MG tablet Take 1 tablet (40 mg total) by mouth daily. 10/18/23 01/16/24  Swinyer, Zachary George, NP  tiotropium (SPIRIVA) 18 MCG inhalation capsule Place 18 mcg into inhaler and inhale daily.    [provider]  tiZANidine (ZANAFLEX) 4 MG tablet Take 4 mg by mouth every 8 (eight) hours as needed. 10/04/23   [provider]  triamcinolone cream (KENALOG) 0.1 % Apply 1 Application topically as needed.    [provider]  valACYclovir (VALTREX) 1000 MG tablet Take 1,000 mg by mouth daily. For 5 days    [provider]  potassium chloride (K-DUR) 10 MEQ tablet Take 1 tablet (10 mEq total) by mouth daily. 03/14/17 05/26/19  Maxwell Caul, PA-C      Allergies    Flagyl [metronidazole] and Metronidazole hcl    Review of  Systems   Review of Systems  Constitutional: Negative.   HENT: Negative.    Respiratory: Negative.    Cardiovascular: Negative.   Gastrointestinal:  Positive for abdominal pain. Negative for abdominal distention, anal bleeding, blood in stool, constipation, diarrhea, nausea, rectal pain and vomiting.  Genitourinary: Negative.   Musculoskeletal: Negative.   Skin: Negative.   Neurological: Negative.   All other systems reviewed and are negative.   Physical Exam Updated Vital Signs BP (!) 153/79   Pulse 62   Temp 97.8 F (36.6 C)   Resp 20   Ht 5\' 4"  (1.626 m)   Wt 61.2 kg   SpO2 99%   BMI 23.17 kg/m  Physical Exam Vitals and nursing note reviewed.  Constitutional:      General: She is not in acute distress.    Appearance: She is well-developed. She is not ill-appearing, toxic-appearing or diaphoretic.  HENT:     Head: Atraumatic.  Eyes:     Pupils: Pupils are equal, round, and reactive to light.  Cardiovascular:     Rate and Rhythm: Normal rate.     Heart sounds: Normal heart sounds.  Pulmonary:     Effort: Pulmonary effort is normal. No respiratory distress.     Breath sounds: Normal breath sounds.  Abdominal:     General: Abdomen is flat. Bowel sounds are normal. There is no distension.     Palpations: Abdomen is soft.     Tenderness: There is abdominal tenderness in the right upper quadrant and right lower quadrant. There is right CVA tenderness. There is no left CVA tenderness, guarding or rebound. Negative signs include Murphy's sign and McBurney's sign.     Comments: Diffuse tenderness to right abdomen and flank, distractible in nature.  No overlying skin changes to suggest shingles  Musculoskeletal:        General: Normal range of motion.     Cervical back: Normal range of motion.  Skin:    General: Skin is warm and dry.  Neurological:     General: No focal deficit present.     Mental Status: She is alert.  Psychiatric:        Mood and Affect: Mood normal.      ED Results / Procedures / Treatments   Labs (all labs ordered are listed, but only abnormal results are displayed) Labs Reviewed  COMPREHENSIVE METABOLIC PANEL - Abnormal; Notable for the following components:      Result Value   Potassium 3.4 (*)    Glucose, Bld 103 (*)    BUN 29 (*)    Creatinine, Ser 1.27 (*)    GFR, Estimated 47 (*)    All other components within normal limits  URINALYSIS, ROUTINE W REFLEX MICROSCOPIC - Abnormal; Notable for the following components:   Specific Gravity, Urine >1.030 (*)    Protein, ur 30 (*)  All other components within normal limits  LIPASE, BLOOD  CBC WITH DIFFERENTIAL/PLATELET  URINALYSIS, MICROSCOPIC (REFLEX)    EKG None  Radiology CT ABDOMEN PELVIS W CONTRAST Result Date: 12/01/2023 CLINICAL DATA:  Acute nonlocalized abdominal pain. Right-sided abdominal pain. 2 week history of right lower back pain radiating to the flank. Nausea and vomiting. EXAM: CT ABDOMEN AND PELVIS WITH CONTRAST TECHNIQUE: Multidetector CT imaging of the abdomen and pelvis was performed using the standard protocol following bolus administration of intravenous contrast. RADIATION DOSE REDUCTION: This exam was performed according to the departmental dose-optimization program which includes automated exposure control, adjustment of the mA and/or kV according to patient size and/or use of iterative reconstruction technique. CONTRAST:  75mL OMNIPAQUE IOHEXOL 350 MG/ML SOLN COMPARISON:  11/23/2023 FINDINGS: Lower chest: Lung bases are clear. Hepatobiliary: Subcentimeter cysts in the liver. No imaging follow-up is indicated. Gallbladder and bile ducts are normal. Pancreas: Unremarkable. No pancreatic ductal dilatation or surrounding inflammatory changes. Spleen: Normal in size without focal abnormality. Adrenals/Urinary Tract: No adrenal gland nodules. Kidneys are symmetrical. No hydronephrosis or hydroureter. Bladder is normal. Stomach/Bowel: Stomach, small bowel, and  colon are not abnormally distended. No wall thickening or inflammatory changes. Appendix is normal. Vascular/Lymphatic: Aortic atherosclerosis. No enlarged abdominal or pelvic lymph nodes. Reproductive: Uterus and bilateral adnexa are unremarkable. Other: No free air or free fluid in the abdomen. Abdominal wall musculature appears intact. Musculoskeletal: Degenerative changes in the spine. No acute bony abnormalities. IMPRESSION: 1. No acute process demonstrated in the abdomen or pelvis. No evidence of bowel obstruction or inflammation. 2. Aortic atherosclerosis. Electronically Signed   By: Burman Nieves M.D.   On: 12/01/2023 20:27   DG Chest 2 View Result Date: 12/01/2023 CLINICAL DATA:  Right pain. Pain radiates to flank and abdomen with some nausea and vomiting. EXAM: CHEST - 2 VIEW COMPARISON:  04/01/2023 FINDINGS: Heart size and mediastinal contours are unremarkable. Aortic atherosclerotic calcifications. No pleural fluid, interstitial edema or airspace disease. Visualized osseous structures are unremarkable. IMPRESSION: 1. No acute cardiopulmonary disease. 2. Aortic Atherosclerosis (ICD10-I70.0). Electronically Signed   By: Signa Kell M.D.   On: 12/01/2023 19:25    Procedures Procedures    Medications Ordered in ED Medications  sodium chloride 0.9 % bolus 1,000 mL (0 mLs Intravenous Stopped 12/01/23 2126)  ondansetron (ZOFRAN) injection 4 mg (4 mg Intravenous Given 12/01/23 1857)  morphine (PF) 4 MG/ML injection 4 mg (4 mg Intravenous Given 12/01/23 1857)  iohexol (OMNIPAQUE) 350 MG/ML injection 75 mL (75 mLs Intravenous Contrast Given 12/01/23 2019)    ED Course/ Medical Decision Making/ A&P   63 year old here for evaluation of right sided abdominal pain and right flank pain.  This is ongoing over the last few weeks.  Symptoms are constant.  No loose stools.  Initially had some nausea which resolved.  Symptoms not exertional, pleuritic in nature.  Not associate with eating or drinking.  She  has no obvious rashes over the area to suggest zoster.  No chest pain, shortness of breath or cough to suggest atypical intrathoracic etiology of her symptoms.  She is without tachycardia, hypoxia.  Does have history of COPD however states her symptoms are at her baseline.  She has clear lungs here.  Will plan on repeating labs, imaging.  Labs and imaging personally viewed interpreted. CBC without leukocytosis CMP calcium 3.4, creatinine 1.27--baseline appears to be about 0.9-1 UA negative for infection Lipase 27 Chest x-ray without infiltrates, cardiomegaly, pulm edema, pneumothorax CT abdomen pelvis without significant abnormality  Discussed  results with patient.  Unclear etiology of symptoms.  Reassuring today.  She is tolerating p.o. intake start her on Bentyl and have her follow-up with a gastroenterologist.  Patient is nontoxic, nonseptic appearing, in no apparent distress.  Patient's pain and other symptoms adequately managed in emergency department.  Fluid bolus given.  Labs, imaging and vitals reviewed.  Patient does not meet the SIRS or Sepsis criteria.  On repeat exam patient does not have a surgical abdomin and there are no peritoneal signs.  No indication of appendicitis, bowel obstruction, bowel perforation, cholecystitis, diverticulitis, PID, intermittent/persistent torsion, shingles, AAA, dissection or ectopic pregnancy.  Patient discharged home with symptomatic treatment and given strict instructions for follow-up with their primary care physician.  I have also discussed reasons to return immediately to the ER.  Patient expresses understanding and agrees with plan.                                 Medical Decision Making Amount and/or Complexity of Data Reviewed External Data Reviewed: labs, radiology and notes. Labs: ordered. Decision-making details documented in ED Course. Radiology: ordered and independent interpretation performed. Decision-making details documented in ED  Course.  Risk OTC drugs. Prescription drug management. Parenteral controlled substances. Decision regarding hospitalization. Diagnosis or treatment significantly limited by social determinants of health.          Final Clinical Impression(s) / ED Diagnoses Final diagnoses:  Generalized abdominal pain    Rx / DC Orders ED Discharge Orders          Ordered    dicyclomine (BENTYL) 20 MG tablet  2 times daily        12/01/23 2127              Jerrilynn Mikowski A, PA-C 12/01/23 2132    Lonell Grandchild, MD 12/01/23 2306

## 2023-12-01 NOTE — Discharge Instructions (Addendum)
Call the gastroenterologist to make an appointment.  Take the medication as prescribed  Return for any worsening symptoms

## 2023-12-05 ENCOUNTER — Ambulatory Visit (INDEPENDENT_AMBULATORY_CARE_PROVIDER_SITE_OTHER): Payer: Medicaid Other | Admitting: Allergy

## 2023-12-05 ENCOUNTER — Encounter: Payer: Self-pay | Admitting: Allergy

## 2023-12-05 ENCOUNTER — Other Ambulatory Visit: Payer: Self-pay

## 2023-12-05 VITALS — BP 124/66 | HR 64 | Temp 97.9°F | Wt 152.8 lb

## 2023-12-05 DIAGNOSIS — J432 Centrilobular emphysema: Secondary | ICD-10-CM | POA: Diagnosis not present

## 2023-12-05 DIAGNOSIS — J310 Chronic rhinitis: Secondary | ICD-10-CM | POA: Diagnosis not present

## 2023-12-05 MED ORDER — LEVOCETIRIZINE DIHYDROCHLORIDE 5 MG PO TABS: 5.0000 mg | ORAL_TABLET | Freq: Every evening | ORAL | 5 refills | Status: AC

## 2023-12-05 MED ORDER — OHTUVAYRE 3 MG/2.5ML IN SUSP: 3.0000 mg | Freq: Two times a day (BID) | RESPIRATORY_TRACT | 11 refills | Status: DC

## 2023-12-05 MED ORDER — MONTELUKAST SODIUM 10 MG PO TABS: 10.0000 mg | ORAL_TABLET | Freq: Every day | ORAL | 5 refills | Status: AC

## 2023-12-05 NOTE — Progress Notes (Signed)
 Follow-up Note  RE: Mariah Foster MRN: [id number] DOB: 23-Apr-1961 Date of Office Visit: 12/05/2023   History of present illness: Mariah Foster is a 63 y.o. female presenting today for follow-up of COPD and nonallergic rhinitis.  She was last seen in the office on 08/29/2023 by myself. Discussed the use of AI scribe software for clinical note transcription with the patient, who gave verbal consent to proceed.  She experiences significant dyspnea during physical activities such as walking from her back door to the garbage dumpster and returning, necessitating a stop to rest and catch her breath. Walking to the store also triggers dyspnea, often requiring the use of her rescue inhaler. She uses Breztri  at a dose of two puffs twice a day and carries it with her at all times. The blue albuterol  inhaler is used as needed, primarily during activities like walking to the store or taking out the trash.  She has not been using the albuterol  prior to known activity that causes symptoms.  She has difficulty climbing stairs in her home, which exacerbates her breathing issues. Climbing thirteen steps to access the bathroom and other areas is challenging and sometimes requires her to rest midway. She has requested a letter to be moved to a flat home to avoid stairs.  Her breathing issues are less severe inside the house, but cigarette smoke from a friend who smokes downstairs affects her when the heat is on, causing smoke to rise to her living area. She keeps a fan on to help circulate the air.  She uses Nasonex  nasal spray daily.      Review of systems: 10pt ROS negative unless noted above in HPI   All other systems negative unless noted above in HPI  Past medical/social/surgical/family history have been reviewed and are unchanged unless specifically indicated below.  No changes  Medication List: Current Outpatient Medications  Medication Sig Dispense Refill   albuterol  (PROVENTIL ) (2.5  MG/3ML) 0.083% nebulizer solution Take 3 mLs (2.5 mg total) by nebulization 3 (three) times daily as needed for wheezing or shortness of breath. 75 mL 0   albuterol  (VENTOLIN  HFA) 108 (90 Base) MCG/ACT inhaler Inhale 2 puffs into the lungs every 6 (six) hours as needed for wheezing or shortness of breath. 18 g 1   amLODipine  (NORVASC ) 5 MG tablet TAKE 1 TABLET(5 MG) BY MOUTH DAILY 90 tablet 3   azelastine  (ASTELIN ) 0.1 % nasal spray Place 2 sprays into both nostrils 2 (two) times daily. Use in each nostril as directed     benzonatate  (TESSALON ) 100 MG capsule Take 1 to 2 capsules (up to 200 mg) 3(THREE) Times a day as needed for cough. 20 capsule 0   Budeson-Glycopyrrol-Formoterol (BREZTRI  AEROSPHERE) 160-9-4.8 MCG/ACT AERO Inhale 2 puffs into the lungs in the morning and at bedtime. 10.7 g 2   Ensifentrine  (OHTUVAYRE ) 3 MG/2.5ML SUSP Inhale 3 mg into the lungs 2 (two) times daily. 2.5 mL 11   levothyroxine  (SYNTHROID ) 25 MCG tablet Take 25 mcg by mouth daily before breakfast.     montelukast  (SINGULAIR ) 10 MG tablet Take 10 mg by mouth at bedtime.     rosuvastatin  (CRESTOR ) 40 MG tablet Take 1 tablet (40 mg total) by mouth daily. 90 tablet 3   BREZTRI  AEROSPHERE 160-9-4.8 MCG/ACT AERO SMARTSIG:2 Puff(s) Via Inhaler Morning-Night     dicyclomine  (BENTYL ) 20 MG tablet Take 1 tablet (20 mg total) by mouth 2 (two) times daily. 20 tablet 0   fluticasone -salmeterol (ADVAIR HFA) 230-21 MCG/ACT  inhaler Inhale 2 puffs into the lungs 2 (two) times daily. 12 g 12   irbesartan  (AVAPRO ) 150 MG tablet Take 1 tablet (150 mg total) by mouth daily. 30 tablet 3   levocetirizine (XYZAL ) 5 MG tablet Take 5 mg by mouth every evening. (Patient not taking: Reported on 12/05/2023)     omeprazole  (PRILOSEC) 20 MG capsule Take 1 capsule (20 mg total) by mouth daily. 30 capsule 0   ondansetron  (ZOFRAN -ODT) 4 MG disintegrating tablet Take 1 tablet (4 mg total) by mouth every 8 (eight) hours as needed for nausea or vomiting.  (Patient not taking: Reported on 12/05/2023) 10 tablet 0   tiotropium (SPIRIVA ) 18 MCG inhalation capsule Place 18 mcg into inhaler and inhale daily. (Patient not taking: Reported on 12/05/2023)     tiZANidine (ZANAFLEX) 4 MG tablet Take 4 mg by mouth every 8 (eight) hours as needed.     triamcinolone cream (KENALOG) 0.1 % Apply 1 Application topically as needed.     valACYclovir  (VALTREX ) 1000 MG tablet Take 1,000 mg by mouth daily. For 5 days     No current facility-administered medications for this visit.     Known medication allergies: Allergies  Allergen Reactions   Flagyl [Metronidazole]     unknown   Metronidazole Hcl Hives    Stomach cramps     Physical examination: Blood pressure 124/66, pulse 64, temperature 97.9 F (36.6 C), temperature source Temporal, weight 152 lb 12.8 oz (69.3 kg), SpO2 95%.  General: Alert, interactive, in no acute distress. HEENT: PERRLA, TMs pearly gray, turbinates moderately edematous without discharge, post-pharynx non erythematous. Neck: Supple without lymphadenopathy. Lungs: Mildly decreased breath sounds bilaterally without wheezing, rhonchi or rales. {no increased work of breathing. CV: Normal S1, S2 without murmurs. Abdomen: Nondistended, nontender. Skin: Warm and dry, without lesions or rashes. Extremities:  No clubbing, cyanosis or edema. Neuro:   Grossly intact.  Diagnositics/Labs: Spirometry: FEV1: 0.78L 38%, FVC: 1.49L 58% predicted.    Assessment and plan:   COPD, centrilobular - Daily controller medication(s):  Breztri  inhaler (yellow) 2 puffs twice a day with spacer device.  Start Ohtuvayre  1 vial twice a day in nebulizer.  - Prior to physical activity: albuterol  2 puffs 10-15 minutes before physical activity. - Rescue medications: albuterol  2-4 puffs every 4-6 hours as needed  - Breathing control goals:  * Full participation in all desired activities (may need albuterol  before activity) * Albuterol  use two time or less a  week on average (not counting use with activity) * Cough interfering with sleep two time or less a month * Oral steroids no more than once a year * No hospitalizations  Non-allergic rhinitis - Environmental allergy panel was negative - Singulair  10mg  daily at bedtime.   - use antihistamine like Xyzal  5mg  daily as needed for sneezing, nasal or eye symptoms - use Astepro  2 sprays each nostril twice a day as needed for runny nose - use Nasonex  2 sprays each nostril daily for 1-2 weeks at a time before stopping once nasal congestion improves for maximum benefit - for watery/itchy eyes can use Olopatadine   0.2% 1 drop each eye daily as needed  Follow-up in 3-4 months or sooner if needed  I appreciate the opportunity to take part in Mariesa's care. Please do not hesitate to contact me with questions.  Sincerely,   Danita Brain, MD Allergy/Immunology [facility]

## 2023-12-05 NOTE — Patient Instructions (Addendum)
 COPD, centrilobular - Daily controller medication(s):  Breztri  inhaler (yellow) 2 puffs twice a day with spacer device.  Start Ohtuvayre  1 vial twice a day in nebulizer.  - Prior to physical activity: albuterol  2 puffs 10-15 minutes before physical activity. - Rescue medications: albuterol  2-4 puffs every 4-6 hours as needed  - Breathing control goals:  * Full participation in all desired activities (may need albuterol  before activity) * Albuterol  use two time or less a week on average (not counting use with activity) * Cough interfering with sleep two time or less a month * Oral steroids no more than once a year * No hospitalizations  Non-allergic rhinitis - Environmental allergy panel was negative - Singulair  10mg  daily at bedtime.   - use antihistamine like Xyzal  5mg  daily as needed for sneezing, nasal or eye symptoms - use Astepro  2 sprays each nostril twice a day as needed for runny nose - use Nasonex  2 sprays each nostril daily for 1-2 weeks at a time before stopping once nasal congestion improves for maximum benefit - for watery/itchy eyes can use Olopatadine   0.2% 1 drop each eye daily as needed  Follow-up in 3-4 months or sooner if needed

## 2023-12-11 ENCOUNTER — Other Ambulatory Visit (HOSPITAL_BASED_OUTPATIENT_CLINIC_OR_DEPARTMENT_OTHER): Payer: Self-pay

## 2023-12-11 ENCOUNTER — Ambulatory Visit (INDEPENDENT_AMBULATORY_CARE_PROVIDER_SITE_OTHER): Payer: Medicaid Other | Admitting: Cardiovascular Disease

## 2023-12-11 ENCOUNTER — Telehealth: Payer: Self-pay | Admitting: Licensed Clinical Social Worker

## 2023-12-11 ENCOUNTER — Encounter (HOSPITAL_BASED_OUTPATIENT_CLINIC_OR_DEPARTMENT_OTHER): Payer: Self-pay | Admitting: Cardiovascular Disease

## 2023-12-11 ENCOUNTER — Other Ambulatory Visit: Payer: Self-pay

## 2023-12-11 VITALS — BP 140/68 | HR 65 | Ht 64.0 in | Wt 153.6 lb

## 2023-12-11 DIAGNOSIS — I10 Essential (primary) hypertension: Secondary | ICD-10-CM

## 2023-12-11 DIAGNOSIS — I2510 Atherosclerotic heart disease of native coronary artery without angina pectoris: Secondary | ICD-10-CM | POA: Diagnosis not present

## 2023-12-11 DIAGNOSIS — E785 Hyperlipidemia, unspecified: Secondary | ICD-10-CM | POA: Diagnosis not present

## 2023-12-11 DIAGNOSIS — I700 Atherosclerosis of aorta: Secondary | ICD-10-CM | POA: Diagnosis not present

## 2023-12-11 HISTORY — DX: Atherosclerotic heart disease of native coronary artery without angina pectoris: I25.10

## 2023-12-11 MED ORDER — BLOOD PRESSURE CUFF MISC
0 refills | Status: DC
  Filled 2023-12-11: qty 1, fill #0

## 2023-12-11 MED ORDER — ROSUVASTATIN CALCIUM 40 MG PO TABS
40.0000 mg | ORAL_TABLET | Freq: Every day | ORAL | 3 refills | Status: DC
  Filled 2023-12-11 (×3): qty 90, 90d supply, fill #0

## 2023-12-11 MED ORDER — SPIRONOLACTONE 25 MG PO TABS
25.0000 mg | ORAL_TABLET | Freq: Every day | ORAL | 3 refills | Status: DC
Start: 2023-12-11 — End: 2024-01-22
  Filled 2023-12-11 (×2): qty 90, 90d supply, fill #0

## 2023-12-11 NOTE — Patient Instructions (Signed)
Medication Instructions:  Your physician has recommended you make the following change in your medication:   START Spironolactone 25 mg daily   Follow-Up: Follow up in 1-2 months in hypertension clinic   Special Instructions:   Tips to Measure your Blood Pressure Correctly  To determine whether you have hypertension, a medical professional will take a blood pressure reading. How you prepare for the test, the position of your arm, and other factors can change a blood pressure reading by 10% or more. That could be enough to hide high blood pressure, start you on a drug you don't really need, or lead your doctor to incorrectly adjust your medications.  National and international guidelines offer specific instructions for measuring blood pressure. If a doctor, nurse, or medical assistant isn't doing it right, don't hesitate to ask him or her to get with the guidelines.  Here's what you can do to ensure a correct reading:  Don't drink a caffeinated beverage or smoke during the 30 minutes before the test.  Sit quietly for five minutes before the test begins.  During the measurement, sit in a chair with your feet on the floor and your arm supported so your elbow is at about heart level.  The inflatable part of the cuff should completely cover at least 80% of your upper arm, and the cuff should be placed on bare skin, not over a shirt.  Don't talk during the measurement.  Have your blood pressure measured twice, with a brief break in between. If the readings are different by 5 points or more, have it done a third time.  In 2017, new guidelines from the American Heart Association, the [employer] of Cardiology, and nine other health organizations lowered the diagnosis of high blood pressure to 130/80 mm Hg or higher for all adults. The guidelines also redefined the various blood pressure categories to now include normal, elevated, Stage 1 hypertension, Stage 2 hypertension, and hypertensive  crisis (see "Blood pressure categories").  Blood pressure categories  Blood pressure category SYSTOLIC (upper number)  DIASTOLIC (lower number)  Normal Less than 120 mm Hg and Less than 80 mm Hg  Elevated 120-129 mm Hg and Less than 80 mm Hg  High blood pressure: Stage 1 hypertension 130-139 mm Hg or 80-89 mm Hg  High blood pressure: Stage 2 hypertension 140 mm Hg or higher or 90 mm Hg or higher  Hypertensive crisis (consult your doctor immediately) Higher than 180 mm Hg and/or Higher than 120 mm Hg  Source: American Heart Association and American Stroke Association. For more on getting your blood pressure under control, buy Controlling Your Blood Pressure, a Special Health Report from [facility].   Blood Pressure Log   Date   Time  Blood Pressure  Position  Example: Nov 1 9 AM 124/78 sitting

## 2023-12-11 NOTE — Progress Notes (Signed)
Advanced Hypertension Clinic Initial Assessment:    Date:  12/11/2023   ID:  Mariah Foster, DOB 09/24/1961, MRN [id number]  PCP:  Quita Skye, PA-C  Cardiologist:  None  Nephrologist:  Referring MD: [name], PA-C   CC: Hypertension  History of Present Illness:    Mariah Foster is a 63 y.o. female with a hx of hypertension, hyperlipidemia, nonobstructive CAD, aortic atherosclerosis, prior tobacco abuse, COPD, and prior GI bleed here to establish care in the Advanced Hypertension Clinic.  She first saw Dr. Shari Prows 12/2022 for uncontrolled hypertension.  At that visit her blood pressure was 145/96.  Irbesartan was increased as was atorvastatin.  She follow-up with Lucile Crater, PA-C 08/2023 and blood pressure remained uncontrolled.  She had not taken all her medication that day.  Labs at that visit revealed abnormal thyroid function.  Amlodipine was restarted and she was started on irbesartan.  Labs were negative for hyperaldosteronism.  Ms. Horn experiences persistent swelling in her ankle, which extends up her leg and affects her big toe, causing discomfort. This has been a long-standing issue, and she has not found relief from previous treatments despite communicating it to her primary doctor.  She has COPD, which limits her ability to walk long distances. She uses an albuterol inhaler, a nebulizer, Singulair, and Spiriva daily to manage her symptoms. She experiences significant shortness of breath after walking short distances, such as down a hallway.  She mentions having a 'muscle spasm' on her side, described as a lump that can become quite large. Currently, it is not active, but it can become bothersome.  She has been out of her cholesterol medication for a while and is unsure who to inform about this. She takes amlodipine and irbesartan for blood pressure management in the mornings but does not monitor her blood pressure at home due to difficulty obtaining a blood pressure  cuff.  She maintains an active lifestyle by walking to the store and up and down stairs at home. She cooks at home and avoids using salt in her diet. She has quit smoking, although her partner, who is diabetic and smokes, continues to smoke in the house.      Previous antihypertensives:    Past Medical History:  Diagnosis Date   COPD (chronic obstructive pulmonary disease) (HCC)    GI bleed    Hyperlipidemia    Hypertension    Tubular adenoma of colon 2013    Past Surgical History:  Procedure Laterality Date   CESAREAN SECTION  4098,1191   x 2    Current Medications: Current Meds  Medication Sig   albuterol (PROVENTIL) (2.5 MG/3ML) 0.083% nebulizer solution Take 3 mLs (2.5 mg total) by nebulization 3 (three) times daily as needed for wheezing or shortness of breath.   albuterol (VENTOLIN HFA) 108 (90 Base) MCG/ACT inhaler Inhale 2 puffs into the lungs every 6 (six) hours as needed for wheezing or shortness of breath.   amLODipine (NORVASC) 5 MG tablet TAKE 1 TABLET(5 MG) BY MOUTH DAILY   azelastine (ASTELIN) 0.1 % nasal spray Place 2 sprays into both nostrils 2 (two) times daily. Use in each nostril as directed   Blood Pressure Monitoring (BLOOD PRESSURE CUFF) MISC Check blood pressure as instructed by your physician   cetirizine (ZYRTEC) 10 MG tablet Take 10 mg by mouth daily.   dicyclomine (BENTYL) 20 MG tablet Take 1 tablet (20 mg total) by mouth 2 (two) times daily.   FEROSUL 325 (65 Fe) MG  tablet Take 325 mg by mouth daily.   hydrOXYzine (ATARAX) 25 MG tablet Take 25 mg by mouth every 6 (six) hours as needed (sleep).   irbesartan (AVAPRO) 150 MG tablet Take 1 tablet (150 mg total) by mouth daily.   levocetirizine (XYZAL) 5 MG tablet Take 1 tablet (5 mg total) by mouth every evening.   levothyroxine (SYNTHROID) 25 MCG tablet Take 25 mcg by mouth daily before breakfast.   montelukast (SINGULAIR) 10 MG tablet Take 1 tablet (10 mg total) by mouth at bedtime.   ondansetron  (ZOFRAN-ODT) 4 MG disintegrating tablet Take 1 tablet (4 mg total) by mouth every 8 (eight) hours as needed for nausea or vomiting.   pantoprazole (PROTONIX) 40 MG tablet Take 40 mg by mouth daily.   spironolactone (ALDACTONE) 25 MG tablet Take 1 tablet (25 mg total) by mouth daily.   tiotropium (SPIRIVA) 18 MCG inhalation capsule Place 18 mcg into inhaler and inhale daily.   tiZANidine (ZANAFLEX) 4 MG tablet Take 4 mg by mouth every 8 (eight) hours as needed.   triamcinolone cream (KENALOG) 0.1 % Apply 1 Application topically as needed.   valACYclovir (VALTREX) 1000 MG tablet Take 1,000 mg by mouth daily. For 5 days   [DISCONTINUED] benzonatate (TESSALON) 100 MG capsule Take 1 to 2 capsules (up to 200 mg) 3(THREE) Times a day as needed for cough.   [DISCONTINUED] rosuvastatin (CRESTOR) 40 MG tablet Take 1 tablet (40 mg total) by mouth daily.     Allergies:   Flagyl [metronidazole] and Metronidazole hcl   Social History   Socioeconomic History   Marital status: Single    Spouse name: Not on file   Number of children: 2   Years of education: Not on file   Highest education level: Not on file  Occupational History   Occupation: cleaning service  Tobacco Use   Smoking status: Former    Current packs/day: 0.20    Average packs/day: 0.2 packs/day for 11.1 years (2.2 ttl pk-yrs)    Types: Cigarettes    Start date: 2014   Smokeless tobacco: Never  Vaping Use   Vaping status: Every Day  Substance and Sexual Activity   Alcohol use: Yes    Comment: socially   Drug use: No   Sexual activity: Never  Other Topics Concern   Not on file  Social History Narrative   Not on file   Social Drivers of Health   Financial Resource Strain: At Risk (11/30/2022)   Received from [place], [place]   Financial [employer]    Financial Resource Strain: 2  Food Insecurity: Low Risk  (12/06/2023)   Received from [facility]   Hunger Vital Sign    Worried About Running Out of Food in the Last Year:  Never true    Ran Out of Food in the Last Year: Never true  Transportation Needs: No Transportation Needs (12/06/2023)   Received from [facility]    In the past 12 months, has lack of reliable transportation kept you from medical appointments, meetings, work or from getting things needed for daily living? : No  Physical Activity: Not on File (11/14/2022)   Received from [place], [place]   Physical Activity    Physical Activity: 0  Stress: Not on File (11/14/2022)   Received from [facility], [place]   Stress    Stress: 0  Social Connections: Not on File (07/06/2023)   Received from [employer]    Connectedness: 0  Family History: The patient's family history includes Diabetes in her brother and sister; Heart attack in her father.  ROS:   Please see the history of present illness.     All other systems reviewed and are negative.  EKGs/Labs/Other Studies Reviewed:    EKG:  EKG is not ordered today.    Recent Labs: 04/01/2023: B Natriuretic Peptide 14.8 09/05/2023: Magnesium 1.9; TSH <0.005 12/01/2023: ALT 29; BUN 29; Creatinine, Ser 1.27; Hemoglobin 12.9; Platelets 343; Potassium 3.4; Sodium 142   Recent Lipid Panel    Component Value Date/Time   CHOL 201 (H) 02/26/2023 1416   TRIG 121 02/26/2023 1416   HDL 60 02/26/2023 1416   CHOLHDL 3.4 02/26/2023 1416   LDLCALC 120 (H) 02/26/2023 1416    Physical Exam:   VS:  BP (!) 140/68   Pulse 65   Ht 5\' 4"  (1.626 m)   Wt 153 lb 9.6 oz (69.7 kg)   SpO2 98%   BMI 26.37 kg/m  , BMI Body mass index is 26.37 kg/m. GENERAL:  Well appearing HEENT: Pupils equal round and reactive, fundi not visualized, oral mucosa unremarkable NECK:  No jugular venous distention, waveform within normal limits, carotid upstroke brisk and symmetric, no bruits, no thyromegaly LUNGS:  Clear to auscultation bilaterally HEART:  RRR.  PMI not displaced or sustained,S1 and S2 within normal limits, no S3, no S4, no clicks, no rubs, no  murmurs ABD:  Flat, positive bowel sounds normal in frequency in pitch, no bruits, no rebound, no guarding, no midline pulsatile mass, no hepatomegaly, no splenomegaly EXT:  2 plus pulses throughout, no edema, no cyanosis no clubbing SKIN:  No rashes no nodules NEURO:  Cranial nerves II through XII grossly intact, motor grossly intact throughout PSYCH:  Cognitively intact, oriented to person place and time   ASSESSMENT/PLAN:    # Hypertension Blood pressure elevated at 140/68. Patient is currently on Amlodipine and Irbesartan.  Labs are not consistent with hyperaldosteronism but she is persistently hypokalemic. -Add Spironolactone 25mg  daily to help with blood pressure control and edema.  Avoiding thiazide diuretic due to hypokalemia. -Check BMP in 1 week to monitor potassium levels and renal function. -Provide patient with a blood pressure cuff and instruct to monitor blood pressure at home.  # Hyperlipidemia # Nonobstructive CAD: # Aortic atherosclerosis: Patient has been out of cholesterol medication for a while. -Refill rosuvastatin.  Recheck lipids and a CMP in 2 to 3 months.  # Chronic Obstructive Pulmonary Disease (COPD) Patient reports shortness of breath with minimal exertion. Currently on Albuterol, Singulair, and Spiriva. -Continue current medications. -Encourage patient to avoid exposure to secondhand smoke.  # Right lower extremity edema Patient reports chronic swelling in the right ankle.  I do not appreciate any edema on exam order appreciate any abdominal distention or bloating.  Neck veins are not elevated.  I suspect this is either related to venous insufficiency or prior musculoskeletal injury. -Add Spironolactone as above to help with edema.  # Abdominal pain Patient reports chronic right-sided abdominal pain. Recent CT scan showed a small cyst in the liver but was otherwise normal. Colonoscopy scheduled for further evaluation. -Continue with planned  colonoscopy.  Follow-up in 1-2 months to reassess blood pressure control and response to Spironolactone.   Screening for Secondary Hypertension:     Relevant Labs/Studies:    Latest Ref Rng & Units 12/01/2023    3:15 PM 11/23/2023   12:02 AM 09/26/2023    3:18 PM  Basic Labs  Sodium  135 - 145 mmol/L 142  138  137   Potassium 3.5 - 5.1 mmol/L 3.4  3.0  4.7   Creatinine 0.44 - 1.00 mg/dL 1.61  0.96  0.45        Latest Ref Rng & Units 09/05/2023   12:19 PM 04/02/2023    3:42 AM  Thyroid   TSH 0.450 - 4.500 uIU/mL <0.005  0.017        Latest Ref Rng & Units 09/05/2023   12:19 PM  Renin/Aldosterone   Aldosterone 0.0 - 30.0 ng/dL <4.0   Aldos/Renin Ratio  CANCELED    Medication Adjustments/Labs and Tests Ordered: Current medicines are reviewed at length with the patient today.  Concerns regarding medicines are outlined above.  No orders of the defined types were placed in this encounter.  Meds ordered this encounter  Medications   rosuvastatin (CRESTOR) 40 MG tablet    Sig: Take 1 tablet (40 mg total) by mouth daily.    Dispense:  90 tablet    Refill:  3   spironolactone (ALDACTONE) 25 MG tablet    Sig: Take 1 tablet (25 mg total) by mouth daily.    Dispense:  90 tablet    Refill:  3   Blood Pressure Monitoring (BLOOD PRESSURE CUFF) MISC    Sig: Check blood pressure as instructed by your physician    Dispense:  1 each    Refill:  0     Signed, Chilton Si, MD  12/11/2023 11:44 AM    [facility]

## 2023-12-11 NOTE — Telephone Encounter (Signed)
H&V Care Navigation CSW Progress Note  Clinical Social Worker contacted patient by phone to f/u on assistance with transportation since pt accidentally left medications at clinic after appt this morning. Was able to reach her at [phone]. Introduced self, role, reason for call. Pt shares she usually uses Medicaid transportation to get to and from appts- but cannot Foster them to get back to clinic on such short notice. Shares she does not have anyone else able to give her a ride/pick them up for her. Arranged cab with permission of Mariah Hacker, RN Clinical team lead. Pt able to get medications and waiver signed, sent in by this writer with assistance of Mariah Foster. Remain available should additional patient needs arise.   Patient is participating in a Managed Medicaid Plan:  Yes- [employer]  SDOH Screenings   Food Insecurity: Low Risk  (12/06/2023)   Received from [facility]  Housing: Low Risk  (12/06/2023)   Received from [facility]  Transportation Needs: No Transportation Needs (12/11/2023)  Utilities: Low Risk  (12/06/2023)   Received from [facility]  Financial Resource Strain: At Risk (11/30/2022)   Received from [place], [place]  Physical Activity: Not on File (11/14/2022)   Received from [place], [place]  Social Connections: Not on File (07/06/2023)   Received from [facility]  Stress: Not on File (11/14/2022)   Received from [place], [place]  Tobacco Use: Medium Risk (12/11/2023)    Mariah Foster, MSW, LCSW Clinical Social Worker II [facility] Heart/Vascular Care Navigation  [phone]- work cell phone (preferred) [phone]- desk phone    12/11/2023  Mariah Foster DOB: 1961/08/28 MRN: [id number]   RIDER WAIVER AND RELEASE OF LIABILITY  For the purposes of helping with transportation needs, Mariah Foster partners with outside transportation providers (taxi companies, [employer], Catering manager.) to give Mariah Foster patients or other approved people the choice of on-demand rides  [facility]") to our buildings for non-emergency visits.  By using Mariah Foster, I, the person signing this document, on behalf of myself and/or any legal minors (in my care using the Mariah Foster), agree:  Science writer given to me are supplied by independent, outside transportation providers who do not work for, or have any affiliation with, Mariah Foster. [employer] is not a transportation company. [place] has no control over the quality or safety of the rides I get using Mariah Foster. Bureau has no control over whether any outside ride will happen on time or not. No Name gives no guarantee on the reliability, quality, safety, or availability on any rides, or that no mistakes will happen. I know and accept that traveling by vehicle (car, truck, SVU, Zenaida Niece, bus, taxi, etc.) has risks of serious injuries such as disability, being paralyzed, and death. I know and agree the risk of using Mariah Foster is mine alone, and not [employer]. Transport Services are provided "as is" and as are available. The transportation providers are in charge for all inspections and care of the vehicles used to provide these rides. I agree not to take legal action against [facility], its agents, employees, officers, directors, representatives, insurers, attorneys, assigns, successors, subsidiaries, and affiliates at any time for any reasons related directly or indirectly to using Mariah Foster. I also agree not to take legal action against  or its affiliates for any injury, death, or damage to property caused by or related to using Mariah Foster. I have read this Waiver and Release of Liability, and I understand the terms used in  it and their legal meaning. This Waiver is freely and voluntarily given with the understanding that my right (or any legal minors) to legal action against [place] relating to Mariah Foster is knowingly given up to use  these services.   I attest that I read the Ride Waiver and Release of Liability to Mariah Foster, gave Ms. Mariah Foster the opportunity to ask questions and answered the questions asked (if any). I affirm that Mariah Foster then provided consent for assistance with transportation.     Mariah Foster

## 2023-12-13 ENCOUNTER — Telehealth: Payer: Self-pay | Admitting: Allergy

## 2023-12-13 NOTE — Telephone Encounter (Signed)
Forwarding message to PA Team to initiate PA.

## 2023-12-13 NOTE — Telephone Encounter (Signed)
Marchelle Folks from [facility] called stating the patient needs a PA on [place].

## 2023-12-16 ENCOUNTER — Telehealth: Payer: Self-pay

## 2023-12-16 ENCOUNTER — Encounter: Payer: Self-pay | Admitting: Allergy

## 2023-12-16 NOTE — Telephone Encounter (Signed)
 Ohtuvayre medication is not being managed by Pulmonology. Notes clearly indicate asthma/allergy prescribing. Routing back to PA team

## 2023-12-16 NOTE — Telephone Encounter (Signed)
 Mariah Bruins, MD  [facility] Please create into a letter:  Letter is pended to this encounter please print and have it available for pt

## 2023-12-18 ENCOUNTER — Emergency Department (HOSPITAL_COMMUNITY)
Admission: EM | Admit: 2023-12-18 | Discharge: 2023-12-18 | Disposition: A | Discharge status: Home / Self Care | Payer: Medicaid Other | Attending: Emergency Medicine | Admitting: Emergency Medicine

## 2023-12-18 ENCOUNTER — Other Ambulatory Visit: Payer: Self-pay

## 2023-12-18 ENCOUNTER — Encounter (HOSPITAL_COMMUNITY): Payer: Self-pay

## 2023-12-18 ENCOUNTER — Ambulatory Visit: Payer: Medicaid Other | Admitting: Pulmonary Disease

## 2023-12-18 DIAGNOSIS — I10 Essential (primary) hypertension: Secondary | ICD-10-CM | POA: Insufficient documentation

## 2023-12-18 DIAGNOSIS — I2510 Atherosclerotic heart disease of native coronary artery without angina pectoris: Secondary | ICD-10-CM | POA: Diagnosis not present

## 2023-12-18 DIAGNOSIS — Z79899 Other long term (current) drug therapy: Secondary | ICD-10-CM | POA: Insufficient documentation

## 2023-12-18 DIAGNOSIS — J449 Chronic obstructive pulmonary disease, unspecified: Secondary | ICD-10-CM | POA: Diagnosis not present

## 2023-12-18 DIAGNOSIS — K2970 Gastritis, unspecified, without bleeding: Secondary | ICD-10-CM | POA: Diagnosis not present

## 2023-12-18 DIAGNOSIS — R1011 Right upper quadrant pain: Secondary | ICD-10-CM | POA: Diagnosis present

## 2023-12-18 LAB — COMPREHENSIVE METABOLIC PANEL
ALT: 39 U/L (ref 0–44)
AST: 33 U/L (ref 15–41)
Albumin: 4.4 g/dL (ref 3.5–5.0)
Alkaline Phosphatase: 91 U/L (ref 38–126)
Anion gap: 10 (ref 5–15)
BUN: 30 mg/dL — ABNORMAL HIGH (ref 8–23)
CO2: 24 mmol/L (ref 22–32)
Calcium: 9 mg/dL (ref 8.9–10.3)
Chloride: 106 mmol/L (ref 98–111)
Creatinine, Ser: 1.23 mg/dL — ABNORMAL HIGH (ref 0.44–1.00)
GFR, Estimated: 49 mL/min — ABNORMAL LOW (ref >60)
Glucose, Bld: 223 mg/dL — ABNORMAL HIGH (ref 70–99)
Potassium: 3.6 mmol/L (ref 3.5–5.1)
Sodium: 140 mmol/L (ref 135–145)
Total Bilirubin: 1.2 mg/dL (ref 0.0–1.2)
Total Protein: 8 g/dL (ref 6.5–8.1)

## 2023-12-18 LAB — CBC WITH DIFFERENTIAL/PLATELET
Abs Immature Granulocytes: 0.02 10*3/uL (ref 0.00–0.07)
Basophils Absolute: 0.1 10*3/uL (ref 0.0–0.1)
Basophils Relative: 1 %
Eosinophils Absolute: 0.4 10*3/uL (ref 0.0–0.5)
Eosinophils Relative: 5 %
HCT: 39.7 % (ref 36.0–46.0)
Hemoglobin: 13 g/dL (ref 12.0–15.0)
Immature Granulocytes: 0 %
Lymphocytes Relative: 45 %
Lymphs Abs: 3.6 10*3/uL (ref 0.7–4.0)
MCH: 31.2 pg (ref 26.0–34.0)
MCHC: 32.7 g/dL (ref 30.0–36.0)
MCV: 95.2 fL (ref 80.0–100.0)
Monocytes Absolute: 0.6 10*3/uL (ref 0.1–1.0)
Monocytes Relative: 7 %
Neutro Abs: 3.3 10*3/uL (ref 1.7–7.7)
Neutrophils Relative %: 42 %
Platelets: 327 10*3/uL (ref 150–400)
RBC: 4.17 MIL/uL (ref 3.87–5.11)
RDW: 14.3 % (ref 11.5–15.5)
WBC: 8 10*3/uL (ref 4.0–10.5)
nRBC: 0 % (ref 0.0–0.2)

## 2023-12-18 LAB — URINALYSIS, MICROSCOPIC (REFLEX)

## 2023-12-18 LAB — LIPASE, BLOOD: Lipase: 26 U/L (ref 11–51)

## 2023-12-18 LAB — URINALYSIS, ROUTINE W REFLEX MICROSCOPIC
Bilirubin Urine: NEGATIVE
Glucose, UA: NEGATIVE mg/dL
Ketones, ur: NEGATIVE mg/dL
Leukocytes,Ua: NEGATIVE
Nitrite: NEGATIVE
Protein, ur: 100 mg/dL — AB
Specific Gravity, Urine: 1.029 (ref 1.005–1.030)
pH: 6 (ref 5.0–8.0)

## 2023-12-18 MED ORDER — LIDOCAINE VISCOUS HCL 2 % MT SOLN
15.0000 mL | Freq: Once | OROMUCOSAL | Status: AC
  Administered 2023-12-18: 15 mL via ORAL
  Filled 2023-12-18: qty 15

## 2023-12-18 MED ORDER — ALUM & MAG HYDROXIDE-SIMETH 200-200-20 MG/5ML PO SUSP
30.0000 mL | Freq: Once | ORAL | Status: AC
Start: 2023-12-18 — End: 2023-12-18
  Administered 2023-12-18: 30 mL via ORAL
  Filled 2023-12-18: qty 30

## 2023-12-18 MED ORDER — PANTOPRAZOLE SODIUM 40 MG IV SOLR
40.0000 mg | Freq: Once | INTRAVENOUS | Status: AC
  Administered 2023-12-18: 40 mg via INTRAVENOUS
  Filled 2023-12-18: qty 10

## 2023-12-18 NOTE — ED Provider Notes (Signed)
 [place] EMERGENCY DEPARTMENT AT [facility] Provider Note   CSN: [id number] Arrival date & time: 12/18/23  1909     History  Chief Complaint  Patient presents with   Abdominal Pain    Mariah Foster is a 63 y.o. female history of CAD, hypertension, COPD, GI bleed not currently on any blood thinners presented for right upper quadrant pain.  Patient states this pain has been present for the past few months till up to a year.  Patient is seen multiple times for this and had a recent CT scan done earlier this month that was negative.  Patient is unsure if she has acid reflux but states the pain gets worse after eating.  Patient does not take any antacids and does not take any medications for her pain.  Patient denies chest pain or shortness of breath, dysuria, fevers.   Home Medications Prior to Admission medications   Medication Sig Start Date End Date Taking? Authorizing Provider  albuterol (PROVENTIL) (2.5 MG/3ML) 0.083% nebulizer solution Take 3 mLs (2.5 mg total) by nebulization 3 (three) times daily as needed for wheezing or shortness of breath. 08/29/23   Marcelyn Bruins, MD  albuterol (VENTOLIN HFA) 108 (90 Base) MCG/ACT inhaler Inhale 2 puffs into the lungs every 6 (six) hours as needed for wheezing or shortness of breath. 08/29/23   Marcelyn Bruins, MD  amLODipine (NORVASC) 5 MG tablet TAKE 1 TABLET(5 MG) BY MOUTH DAILY 10/02/23   Dunn, Tacey Ruiz, PA-C  azelastine (ASTELIN) 0.1 % nasal spray Place 2 sprays into both nostrils 2 (two) times daily. Use in each nostril as directed    [provider]  Blood Pressure Monitoring (BLOOD PRESSURE CUFF) MISC Check blood pressure as instructed by your physician 12/11/23   Chilton Si, MD  cetirizine (ZYRTEC) 10 MG tablet Take 10 mg by mouth daily. 11/09/23   [provider]  dicyclomine (BENTYL) 20 MG tablet Take 1 tablet (20 mg total) by mouth 2 (two) times daily. 12/01/23   Henderly, Britni  A, PA-C  FEROSUL 325 (65 Fe) MG tablet Take 325 mg by mouth daily. 11/09/23   [provider]  hydrOXYzine (ATARAX) 25 MG tablet Take 25 mg by mouth every 6 (six) hours as needed (sleep). 11/26/23   [provider]  irbesartan (AVAPRO) 150 MG tablet Take 1 tablet (150 mg total) by mouth daily. 09/06/23   Dunn, Tacey Ruiz, PA-C  levocetirizine (XYZAL) 5 MG tablet Take 1 tablet (5 mg total) by mouth every evening. 12/05/23   Marcelyn Bruins, MD  levothyroxine (SYNTHROID) 25 MCG tablet Take 25 mcg by mouth daily before breakfast. 10/07/23   [provider]  montelukast (SINGULAIR) 10 MG tablet Take 1 tablet (10 mg total) by mouth at bedtime. 12/05/23   Marcelyn Bruins, MD  ondansetron (ZOFRAN-ODT) 4 MG disintegrating tablet Take 1 tablet (4 mg total) by mouth every 8 (eight) hours as needed for nausea or vomiting. 11/23/23   Antony Madura, PA-C  pantoprazole (PROTONIX) 40 MG tablet Take 40 mg by mouth daily. 12/06/23 12/05/24  [provider]  rosuvastatin (CRESTOR) 40 MG tablet Take 1 tablet (40 mg total) by mouth daily. 12/11/23 03/10/24  Chilton Si, MD  spironolactone (ALDACTONE) 25 MG tablet Take 1 tablet (25 mg total) by mouth daily. 12/11/23 03/10/24  Chilton Si, MD  tiotropium (SPIRIVA) 18 MCG inhalation capsule Place 18 mcg into inhaler and inhale daily.    [provider]  tiZANidine (ZANAFLEX) 4  MG tablet Take 4 mg by mouth every 8 (eight) hours as needed. 10/04/23   [provider]  triamcinolone cream (KENALOG) 0.1 % Apply 1 Application topically as needed.    [provider]  valACYclovir (VALTREX) 1000 MG tablet Take 1,000 mg by mouth daily. For 5 days    [provider]  potassium chloride (K-DUR) 10 MEQ tablet Take 1 tablet (10 mEq total) by mouth daily. 03/14/17 05/26/19  Maxwell Caul, PA-C      Allergies    Flagyl [metronidazole] and Metronidazole hcl    Review of Systems   Review of Systems   Gastrointestinal:  Positive for abdominal pain.    Physical Exam Updated Vital Signs BP (!) 154/101 (BP Location: Left Arm)   Pulse 86   Temp 98.3 F (36.8 C) (Oral)   Resp 20   Wt 67.6 kg   SpO2 98%   BMI 25.58 kg/m  Physical Exam Vitals reviewed.  Constitutional:      General: She is not in acute distress. HENT:     Head: Normocephalic and atraumatic.  Eyes:     Extraocular Movements: Extraocular movements intact.     Conjunctiva/sclera: Conjunctivae normal.     Pupils: Pupils are equal, round, and reactive to light.  Cardiovascular:     Rate and Rhythm: Normal rate and regular rhythm.     Pulses: Normal pulses.     Heart sounds: Normal heart sounds.     Comments: 2+ bilateral radial/dorsalis pedis pulses with regular rate Pulmonary:     Effort: Pulmonary effort is normal. No respiratory distress.     Breath sounds: Normal breath sounds.  Abdominal:     Palpations: Abdomen is soft.     Tenderness: There is abdominal tenderness in the right upper quadrant and epigastric area. There is no guarding or rebound. Negative signs include Murphy's sign, Rovsing's sign, McBurney's sign and psoas sign.  Musculoskeletal:        General: Normal range of motion.     Cervical back: Normal range of motion and neck supple.     Comments: 5 out of 5 bilateral grip/leg extension strength  Skin:    General: Skin is warm and dry.     Capillary Refill: Capillary refill takes less than 2 seconds.  Neurological:     General: No focal deficit present.     Mental Status: She is alert and oriented to person, place, and time.     Comments: Sensation intact in all 4 limbs  Psychiatric:        Mood and Affect: Mood normal.     ED Results / Procedures / Treatments   Labs (all labs ordered are listed, but only abnormal results are displayed) Labs Reviewed  COMPREHENSIVE METABOLIC PANEL - Abnormal; Notable for the following components:      Result Value   Glucose, Bld 223 (*)    BUN 30  (*)    Creatinine, Ser 1.23 (*)    GFR, Estimated 49 (*)    All other components within normal limits  URINALYSIS, ROUTINE W REFLEX MICROSCOPIC - Abnormal; Notable for the following components:   APPearance HAZY (*)    Hgb urine dipstick SMALL (*)    Protein, ur 100 (*)    All other components within normal limits  URINALYSIS, MICROSCOPIC (REFLEX) - Abnormal; Notable for the following components:   Bacteria, UA RARE (*)    All other components within normal limits  LIPASE, BLOOD  CBC WITH DIFFERENTIAL/PLATELET  EKG None  Radiology No results found.  Procedures Procedures    Medications Ordered in ED Medications  pantoprazole (PROTONIX) injection 40 mg (40 mg Intravenous Given 12/18/23 1933)  alum & mag hydroxide-simeth (MAALOX/MYLANTA) 200-200-20 MG/5ML suspension 30 mL (30 mLs Oral Given 12/18/23 2024)    And  lidocaine (XYLOCAINE) 2 % viscous mouth solution 15 mL (15 mLs Oral Given 12/18/23 2024)    ED Course/ Medical Decision Making/ A&P                                 Medical Decision Making Amount and/or Complexity of Data Reviewed Labs: ordered.  Risk Prescription drug management.   Kwanza A Milley 64 y.o. presented today for abdominal pain.  Working DDx that I considered at this time includes, but not limited to, gastroenteritis, colitis, small bowel obstruction, appendicitis, cholecystitis, hepatobiliary pathology, gastritis, PUD, ACS, aortic dissection, diverticulosis/diverticulitis, pancreatitis, nephrolithiasis, medication induced, AAA, UTI, pyelonephritis, ruptured ectopic pregnancy, PID, ovarian torsion.  R/o DDx: gastroenteritis, colitis, small bowel obstruction, appendicitis, cholecystitis, hepatobiliary pathology, ACS, aortic dissection, diverticulosis/diverticulitis, pancreatitis, nephrolithiasis, medication induced, AAA, UTI, pyelonephritis, ruptured ectopic pregnancy, PID, ovarian torsion: These are considered less likely due to history of present  illness, physical exam, labs/imaging findings.  Review of prior external notes: 12/06/2023 office visit  Unique Tests and My Independent Interpretation:  CBC with differential: Unremarkable CMP: Unremarkable Lipase: Unremarkable UA: Unremarkable  Social Determinants of Health: none  Discussion with Independent Historian: None  Discussion of Management of Tests: None  Risk: Medium: prescription drug management  Risk Stratification Score: None  Plan: On exam patient was in no acute distress with stable vitals.  On exam patient does have right upper quadrant/epigastric tenderness without peritoneal signs.  Patient was seen earlier this month and had a negative CT scan and upon chart review does appear to have chronic pain in this area.  Patient states pain gets worse after eating suspicious of possible gastritis or acid reflux will give Protonix and check labs.  Patient's labs are reassuring.  Patient did get minimal relief but still has some abdominal discomfort after GI cocktail and Protonix.  I spoke to the patient and we had shared decision making and patient does not feel requiring ultrasound is necessary at this time as she recently had a CT scan done and that this is the same pain she has had for almost a year and has reassuring labs.  I strongly encouraged her to follow-up with her [facility] doc to have an endoscopy done to eval for possible peptic ulcers.  Patient does have Protonix as historical med and I recommended she takes this medication in the meantime and avoid ibuprofen as I do suspect patient may have gastritis causing her symptoms.  Patient was given return precautions. Patient stable for discharge at this time.  Patient verbalized understanding of plan.  This chart was dictated using voice recognition software.  Despite best efforts to proofread,  errors can occur which can change the documentation meaning.         Final Clinical Impression(s) / ED  Diagnoses Final diagnoses:  Gastritis without bleeding, unspecified chronicity, unspecified gastritis type    Rx / DC Orders ED Discharge Orders     None         Remi Deter 12/18/23 2041    Gerhard Munch, MD 12/18/23 2245

## 2023-12-18 NOTE — ED Triage Notes (Signed)
 BIB EMS/ generalized ABD pain for months/ pt describes as "knots in stomach"/ LBM- today/ dark stool/ pt A&Ox4

## 2023-12-18 NOTE — Discharge Instructions (Signed)
 Please follow-up with your GI doctor at [facility] in regards recent and ER visit.  Today your labs are reassuring however you most likely need to have an endoscopy done to further look into your abdominal pain.  Please take the Protonix prescribed you by your previous provider and if symptoms change or worsen please return to the ER.

## 2023-12-23 ENCOUNTER — Other Ambulatory Visit (HOSPITAL_COMMUNITY): Payer: Self-pay

## 2023-12-24 NOTE — Telephone Encounter (Signed)
 Left detailed for pt about picking up form in [place] left address and asked to call us if she had questions

## 2024-01-01 NOTE — Telephone Encounter (Signed)
Started approval

## 2024-01-07 NOTE — Telephone Encounter (Signed)
 Patients medicaid plan has denied coverage  Ohtuvayre due to no trial and failure of preferred drugs Advair DIskus or Dulera

## 2024-01-22 ENCOUNTER — Encounter (HOSPITAL_BASED_OUTPATIENT_CLINIC_OR_DEPARTMENT_OTHER): Payer: Self-pay | Admitting: Cardiovascular Disease

## 2024-01-22 ENCOUNTER — Telehealth (HOSPITAL_COMMUNITY): Payer: Self-pay | Admitting: Licensed Clinical Social Worker

## 2024-01-22 ENCOUNTER — Ambulatory Visit (HOSPITAL_BASED_OUTPATIENT_CLINIC_OR_DEPARTMENT_OTHER): Payer: Medicaid Other | Admitting: Cardiovascular Disease

## 2024-01-22 ENCOUNTER — Telehealth (HOSPITAL_BASED_OUTPATIENT_CLINIC_OR_DEPARTMENT_OTHER): Payer: Self-pay | Admitting: Cardiovascular Disease

## 2024-01-22 VITALS — BP 139/78 | HR 70 | Ht 64.0 in

## 2024-01-22 DIAGNOSIS — I10 Essential (primary) hypertension: Secondary | ICD-10-CM | POA: Diagnosis not present

## 2024-01-22 DIAGNOSIS — Z5181 Encounter for therapeutic drug level monitoring: Secondary | ICD-10-CM | POA: Diagnosis not present

## 2024-01-22 MED ORDER — BLOOD PRESSURE CUFF MISC: 0 refills | Status: AC

## 2024-01-22 MED ORDER — SPIRONOLACTONE 25 MG PO TABS: 25.0000 mg | ORAL_TABLET | Freq: Every day | ORAL | 3 refills | Status: AC

## 2024-01-22 MED ORDER — AMLODIPINE BESYLATE 5 MG PO TABS
5.0000 mg | ORAL_TABLET | Freq: Every day | ORAL | 3 refills | Status: AC
Start: 2024-01-22 — End: ?

## 2024-01-22 MED ORDER — ROSUVASTATIN CALCIUM 40 MG PO TABS: 40.0000 mg | ORAL_TABLET | Freq: Every day | ORAL | 3 refills | Status: AC

## 2024-01-22 MED ORDER — BLOOD PRESSURE CUFF MISC: 0 refills | Status: DC

## 2024-01-22 MED ORDER — IRBESARTAN 150 MG PO TABS: 150.0000 mg | ORAL_TABLET | Freq: Every day | ORAL | 3 refills | Status: AC

## 2024-01-22 NOTE — Telephone Encounter (Signed)
 Caller Mariah Foster) stated patient's prescription for Blood Pressure Monitoring (BLOOD PRESSURE CUFF) MISC was canceled and patient wants to get prescription re-instated.

## 2024-01-22 NOTE — Patient Instructions (Addendum)
 Medication Instructions:  ALL YOUR BLOOD PRESSURE MEDICATIONS HAVE BEEN SENT TO PHARMACY, MAKE SURE YOU ARE TAKING THEM DAILY AS PRESCRIBED   Labwork: BMET IN 1 WEEK   Testing/Procedures: NONE  Follow-Up: 2 MONTHS WITH DR [place] OR CAITLIN W NP   You have been referred to [facility]    If you need a refill on your cardiac medications before your next appointment, please call your pharmacy.

## 2024-01-22 NOTE — Progress Notes (Signed)
 [facility] Initial Assessment:    Date:  02/01/2024   ID:  Mariah Foster, DOB 1961-04-05, MRN [id number]  PCP:  Quita Skye, PA-C  Cardiologist:  Chilton Si, MD   Referring MD: Quita Skye, PA-C   CC: Hypertension  History of Present Illness:    Mariah Foster is a 63 y.o. female with a hx of hypertension, hyperlipidemia, nonobstructive CAD, aortic atherosclerosis, prior tobacco abuse, COPD, and prior GI bleed here to establish care in the [facility].  She first saw Dr. Shari Prows 12/2022 for uncontrolled hypertension.  At that visit her blood pressure was 145/96.  Irbesartan was increased as was atorvastatin.  She follow-up with Lucile Crater, PA-C 08/2023 and blood pressure remained uncontrolled.  She had not taken all her medication that day.  Labs at that visit revealed abnormal thyroid function.  Amlodipine was restarted and she was started on irbesartan.  Labs were negative for hyperaldosteronism.  At her visit 11/2023 she struggled with exercise due to her COPD.  She had not been taking her cholesterol medication due to running out of it.  Blood pressure was 140/68.  Spironolactone was added to her regimen due to history of hypokalemia.  Since her last visit she was seen in the ED 11/2023 with abdominal pain.  She presented with abdominal pain and noted to have right upper quadrant/epigastric tenderness.  She had a prior CT scan that was negative pain they were concerned about gastritis and started her on pantoprazole.  Discussed the use of AI scribe software for clinical note transcription with the patient, who gave verbal consent to proceed.  History of Present Illness Mariah Foster is a 63 year old female with hypertension and COPD who presents for medication management and evaluation of leg swelling.  She experiences swelling in her right leg, particularly around the ankle, which she attributes to a work-related injury sustained three  to four years ago. The swelling sometimes extends to her foot, causing stinging pain in her big toe and difficulty moving her toes. The swelling worsens when her leg is dependent.  She has hypertension and is currently taking irbesartan 150 mg, but has run out of amlodipine and spironolactone due to financial constraints and issues with insurance coverage. Her blood pressure is not monitored at home due to the lack of a blood pressure cuff.  She has a history of COPD and uses two inhalers: one twice daily and another as needed for shortness of breath. She experiences difficulty obtaining albuterol from her pharmacy, which no longer carries it. She experiences some shortness of breath with exertion but does not use supplemental oxygen.  She reports a history of a knot under her ribs that becomes prominent when she bends, but denies any associated bleeding. She is scheduled for a colonoscopy next month to investigate this issue further.  She quit smoking approximately six years ago and engages in regular physical activity through daily tasks such as walking to the dumpster, mailbox, and store.   Previous antihypertensives:   Past Medical History:  Diagnosis Date   CAD in native artery 12/11/2023   COPD (chronic obstructive pulmonary disease) (HCC)    GI bleed    Hyperlipidemia    Hypertension    Tubular adenoma of colon 2013    Past Surgical History:  Procedure Laterality Date   CESAREAN SECTION  4098,1191   x 2    Current Medications: Current Meds  Medication Sig   albuterol (PROVENTIL) (2.5 MG/3ML) 0.083%  nebulizer solution Take 3 mLs (2.5 mg total) by nebulization 3 (three) times daily as needed for wheezing or shortness of breath.   albuterol (VENTOLIN HFA) 108 (90 Base) MCG/ACT inhaler Inhale 2 puffs into the lungs every 6 (six) hours as needed for wheezing or shortness of breath.   azelastine (ASTELIN) 0.1 % nasal spray Place 2 sprays into both nostrils 2 (two) times daily. Use  in each nostril as directed   cetirizine (ZYRTEC) 10 MG tablet Take 10 mg by mouth daily.   dicyclomine (BENTYL) 20 MG tablet Take 1 tablet (20 mg total) by mouth 2 (two) times daily.   FEROSUL 325 (65 Fe) MG tablet Take 325 mg by mouth daily.   hydrOXYzine (ATARAX) 25 MG tablet Take 25 mg by mouth every 6 (six) hours as needed (sleep).   levocetirizine (XYZAL) 5 MG tablet Take 1 tablet (5 mg total) by mouth every evening.   levothyroxine (SYNTHROID) 25 MCG tablet Take 25 mcg by mouth daily before breakfast.   montelukast (SINGULAIR) 10 MG tablet Take 1 tablet (10 mg total) by mouth at bedtime.   ondansetron (ZOFRAN-ODT) 4 MG disintegrating tablet Take 1 tablet (4 mg total) by mouth every 8 (eight) hours as needed for nausea or vomiting.   pantoprazole (PROTONIX) 40 MG tablet Take 40 mg by mouth daily.   tiotropium (SPIRIVA) 18 MCG inhalation capsule Place 18 mcg into inhaler and inhale daily.   tiZANidine (ZANAFLEX) 4 MG tablet Take 4 mg by mouth every 8 (eight) hours as needed.   triamcinolone cream (KENALOG) 0.1 % Apply 1 Application topically as needed.   valACYclovir (VALTREX) 1000 MG tablet Take 1,000 mg by mouth daily. For 5 days   [DISCONTINUED] amLODipine (NORVASC) 5 MG tablet TAKE 1 TABLET(5 MG) BY MOUTH DAILY   [DISCONTINUED] Blood Pressure Monitoring (BLOOD PRESSURE CUFF) MISC Check blood pressure as instructed by your physician   [DISCONTINUED] irbesartan (AVAPRO) 150 MG tablet Take 1 tablet (150 mg total) by mouth daily.   [DISCONTINUED] rosuvastatin (CRESTOR) 40 MG tablet Take 1 tablet (40 mg total) by mouth daily.   [DISCONTINUED] spironolactone (ALDACTONE) 25 MG tablet Take 1 tablet (25 mg total) by mouth daily.     Allergies:   Flagyl [metronidazole] and Metronidazole hcl   Social History   Socioeconomic History   Marital status: Single    Spouse name: Not on file   Number of children: 2   Years of education: Not on file   Highest education level: Not on file   Occupational History   Occupation: cleaning service  Tobacco Use   Smoking status: Former    Current packs/day: 0.20    Average packs/day: 0.2 packs/day for 11.3 years (2.3 ttl pk-yrs)    Types: Cigarettes    Start date: 2014   Smokeless tobacco: Never  Vaping Use   Vaping status: Every Day  Substance and Sexual Activity   Alcohol use: Yes    Comment: socially   Drug use: No   Sexual activity: Never  Other Topics Concern   Not on file  Social History Narrative   Not on file   Social Drivers of Health   Financial Resource Strain: High Risk (01/22/2024)   Overall Financial Resource Strain (CARDIA)    Difficulty of Paying Living Expenses: Hard  Food Insecurity: Food Insecurity Present (01/22/2024)   Hunger Vital Sign    Worried About Running Out of Food in the Last Year: Sometimes true    Ran Out of Food in the  Last Year: Sometimes true  Transportation Needs: No Transportation Needs (12/11/2023)   PRAPARE - Administrator, Civil Service (Medical): No    Lack of Transportation (Non-Medical): No  Physical Activity: Not on File (11/14/2022)   Received from [place], [place]   Physical Activity    Physical Activity: 0  Stress: Not on File (11/14/2022)   Received from [facility], [place]   Stress    Stress: 0  Social Connections: Not on File (07/06/2023)   Received from [facility]   Social Connections    Connectedness: 0     Family History: The patient's family history includes Diabetes in her brother and sister; Heart attack in her father.  ROS:   Please see the history of present illness.     All other systems reviewed and are negative.  EKGs/Labs/Other Studies Reviewed:    EKG:  EKG is not ordered today.    Recent Labs: 04/01/2023: B Natriuretic Peptide 14.8 09/05/2023: Magnesium 1.9; TSH <0.005 12/18/2023: ALT 39; Hemoglobin 13.0; Platelets 327 01/29/2024: BUN 14; Creatinine, Ser 1.14; Potassium 4.3; Sodium 141   Recent Lipid Panel    Component Value Date/Time   CHOL  201 (H) 02/26/2023 1416   TRIG 121 02/26/2023 1416   HDL 60 02/26/2023 1416   CHOLHDL 3.4 02/26/2023 1416   LDLCALC 120 (H) 02/26/2023 1416    Physical Exam:   VS:  BP 139/78 (BP Location: Left Arm)   Pulse 70   Ht 5\' 4"  (1.626 m)   SpO2 93%   BMI 25.58 kg/m  , BMI Body mass index is 25.58 kg/m. GENERAL:  Well appearing HEENT: Pupils equal round and reactive, fundi not visualized, oral mucosa unremarkable NECK:  No jugular venous distention, waveform within normal limits, carotid upstroke brisk and symmetric, no bruits, no thyromegaly LUNGS:  Clear to auscultation bilaterally HEART:  RRR.  PMI not displaced or sustained,S1 and S2 within normal limits, no S3, no S4, no clicks, no rubs, no murmurs ABD:  Flat, positive bowel sounds normal in frequency in pitch, no bruits, no rebound, no guarding, no midline pulsatile mass, no hepatomegaly, no splenomegaly EXT:  2 plus pulses throughout, no edema, no cyanosis no clubbing SKIN:  No rashes no nodules NEURO:  Cranial nerves II through XII grossly intact, motor grossly intact throughout PSYCH:  Cognitively intact, oriented to person place and time  ASSESSMENT/PLAN:    Assessment & Plan  # Hypertension Blood pressure at 152/76 mmHg. On irbesartan 150 mg. Ran out of amlodipine and spironolactone due to financial constraints. Emphasized importance of blood pressure control. - Contact [facility] to verify medication history and ensure all antihypertensive medications are filled. - Encourage use of a pill organizer for medication adherence. - Provide a prescription for a blood pressure cuff and check insurance coverage. - Continue amlodipine, irbesartan and spironolactone. -Check BMP in 1 week  # Hyperlipidemia: # Aortic atherosclerosis: # Non-obstructive CAD:  Continue rosuvastatin.   # Abdominal pain: # GERD:  Upper endoscopy and GI f/u pending.  # Chronic obstructive pulmonary disease (COPD) Intermittent dyspnea during  physical activity. Uses albuterol and another inhaler twice daily. Out of albuterol due to pharmacy issue. Quit smoking for six years. - Coordinate with the pharmacy to ensure she receives an alternative albuterol inhaler.  # Dispo- f/u in 2 months  Referral to social work   Screening for Secondary Hypertension:     Relevant Labs/Studies:    Latest Ref Rng & Units 01/29/2024    9:46 AM 12/18/2023  7:26 PM 12/01/2023    3:15 PM  Basic Labs  Sodium 134 - 144 mmol/L 141  140  142   Potassium 3.5 - 5.2 mmol/L 4.3  3.6  3.4   Creatinine 0.57 - 1.00 mg/dL 1.61  0.96  0.45        Latest Ref Rng & Units 09/05/2023   12:19 PM 04/02/2023    3:42 AM  Thyroid   TSH 0.450 - 4.500 uIU/mL <0.005  0.017        Latest Ref Rng & Units 09/05/2023   12:19 PM  Renin/Aldosterone   Aldosterone 0.0 - 30.0 ng/dL <4.0   Aldos/Renin Ratio  CANCELED    Medication Adjustments/Labs and Tests Ordered: Current medicines are reviewed at length with the patient today.  Concerns regarding medicines are outlined above.  Orders Placed This Encounter  Procedures   Basic metabolic panel   Referral to HRT/VAS Care Navigation   Meds ordered this encounter  Medications   amLODipine (NORVASC) 5 MG tablet    Sig: Take 1 tablet (5 mg total) by mouth daily.    Dispense:  90 tablet    Refill:  3    PLEASE DELIVER   DISCONTD: Blood Pressure Monitoring (BLOOD PRESSURE CUFF) MISC    Sig: Check blood pressure daily for elevated blood pressure    Dispense:  1 each    Refill:  0    PLEASE DELIVER   irbesartan (AVAPRO) 150 MG tablet    Sig: Take 1 tablet (150 mg total) by mouth daily.    Dispense:  30 tablet    Refill:  3    PLEASE DELIVER   spironolactone (ALDACTONE) 25 MG tablet    Sig: Take 1 tablet (25 mg total) by mouth daily.    Dispense:  90 tablet    Refill:  3    PLEASE DELIVER   rosuvastatin (CRESTOR) 40 MG tablet    Sig: Take 1 tablet (40 mg total) by mouth daily.    Dispense:  90 tablet    Refill:   3    PLEASE DELIVER   Blood Pressure Monitoring (BLOOD PRESSURE CUFF) MISC    Sig: Check blood pressure daily for elevated blood pressure    Dispense:  1 each    Refill:  0    PLEASE DELIVER     Signed, Chilton Si, MD  02/01/2024 6:39 PM    [facility]

## 2024-01-22 NOTE — Telephone Encounter (Signed)
 Spoke with [facility] and gave verbal ok to re-instated  Patient was fine with having blood pressure Rx filled there

## 2024-01-22 NOTE — Telephone Encounter (Signed)
 H&V Care Navigation CSW Progress Note  Clinical Social Worker consulted to help pt get BP cuff which was difficult due to financial barriers.  CSW able to assist in having cuff shipped to her home- anticipate delivery tomorrow.  Pt also provided food bag at appt today.  Reports she gets $700/month in early retirement and about $100 from food stamps each month- states this is often not enough to get sufficient food and that she cannot access food pantries due to transportation barriers.  CSW provided with information about [employer] who operate food pantry and have monthly deliver program.  Patient is participating in a Managed Medicaid Plan:  Yes  SDOH Screenings   Food Insecurity: Food Insecurity Present (01/22/2024)  Housing: Low Risk  (12/06/2023)   Received from [facility]  Transportation Needs: No Transportation Needs (12/11/2023)  Utilities: Low Risk  (12/06/2023)   Received from [facility]  Financial Resource Strain: High Risk (01/22/2024)  Physical Activity: Not on File (11/14/2022)   Received from [place], [place]  Social Connections: Not on File (07/06/2023)   Received from [facility]  Stress: Not on File (11/14/2022)   Received from [place], [place]  Tobacco Use: Medium Risk (01/22/2024)   No further needs identified at this time  Burna Sis, LCSW Clinical Social Worker [facility] Desk#: [phone] Cell#: [phone]

## 2024-01-30 LAB — BASIC METABOLIC PANEL WITH GFR
BUN/Creatinine Ratio: 12 (ref 12–28)
BUN: 14 mg/dL (ref 8–27)
CO2: 25 mmol/L (ref 20–29)
Calcium: 9.4 mg/dL (ref 8.7–10.3)
Chloride: 102 mmol/L (ref 96–106)
Creatinine, Ser: 1.14 mg/dL — ABNORMAL HIGH (ref 0.57–1.00)
Glucose: 102 mg/dL — ABNORMAL HIGH (ref 70–99)
Potassium: 4.3 mmol/L (ref 3.5–5.2)
Sodium: 141 mmol/L (ref 134–144)
eGFR: 54 mL/min/{1.73_m2} — ABNORMAL LOW (ref >59)

## 2024-02-01 ENCOUNTER — Encounter (HOSPITAL_BASED_OUTPATIENT_CLINIC_OR_DEPARTMENT_OTHER): Payer: Self-pay | Admitting: Cardiovascular Disease

## 2024-02-04 ENCOUNTER — Encounter: Payer: Self-pay | Admitting: Pulmonary Disease

## 2024-02-04 ENCOUNTER — Ambulatory Visit: Payer: Medicaid Other | Admitting: Pulmonary Disease

## 2024-02-04 VITALS — BP 125/74 | HR 66 | Ht 64.0 in | Wt 150.0 lb

## 2024-02-04 DIAGNOSIS — Z87891 Personal history of nicotine dependence: Secondary | ICD-10-CM

## 2024-02-04 DIAGNOSIS — J432 Centrilobular emphysema: Secondary | ICD-10-CM | POA: Diagnosis not present

## 2024-02-04 MED ORDER — ALBUTEROL SULFATE HFA 108 (90 BASE) MCG/ACT IN AERS: 2.0000 | INHALATION_SPRAY | Freq: Four times a day (QID) | RESPIRATORY_TRACT | 11 refills | Status: AC | PRN

## 2024-02-04 MED ORDER — BREZTRI AEROSPHERE 160-9-4.8 MCG/ACT IN AERO: 2.0000 | INHALATION_SPRAY | Freq: Two times a day (BID) | RESPIRATORY_TRACT | 11 refills | Status: AC

## 2024-02-04 NOTE — Patient Instructions (Addendum)
 Continue breztri inhaler 2 puffs twice daily - rinse mouth out after each use  Use albuterol nebulizer 20-30 minutes before taking your breztri  Continue montelukast 10mg  daily  We will get you scheduled for pulmonary function tests to consider if you qualify for lung volume reduction procedure  Follow up in 3 months

## 2024-02-04 NOTE — Progress Notes (Unsigned)
 Synopsis: Referred in April 2024 for COPD by Laurance Flatten, MD  Subjective:   PATIENT ID: Francis Gaines Askari GENDER: female DOB: June 24, 1961, MRN: [id number]  HPI  Chief Complaint  Patient presents with   Follow-up   Nazareth Norenberg is a 63 year old woman, former smoker with hypertension who returns to pulmonary clinic for COPD.   Initial OV 01/30/23 She was seen in cardiology clinic 01/01/23, note reviewed. She has been ordered for an echocardiogram.   She has significant cough that bothers her all day and it wakes her up at night. She has wheezing and exertional shortness of breath. She has been using symbicort 160-4.21mcg as needed and as needed albuterol.   She has 45 pack year history. Quit smoking 1-2 years ago. She had second hand smoke in childhood from her father. No FH of lung disease. She worked in Public affairs consultant, currently retired.    OV 05/29/23 She was admitted 6/3 to 6/5 for COPD exacerbation. Her breathing has not been great since. She was seen by allergy immunology 05/23/23 with spirometry which showed FEV1 0.72L (35%) FVC 1.13L (43%) ratio 64. She did have significant bronchodilator response.  She is using albuterol inhaler as needed. She does not have any maintenance inhaler at this time.   OV 02/04/24 She experiences significant worsening of her COPD symptoms, with shortness of breath even during minimal exertion, such as walking short distances to the store or the dumpster near her home. She uses an albuterol inhaler, two puffs in the morning and evening, and a nebulizer as needed. She notes that her pharmacy no longer carries one of her inhalers. She is not currently taking Spiriva but uses Breztri and albuterol inhalers. She is unsure about her use of montelukast or Singulair, as she takes multiple medications and has assistance managing them.  She describes persistent abdominal pain characterized by a 'knot' on her right side that radiates to her back, causing  severe discomfort. The pain is likened to a 'doggone ball' and is associated with nausea but no vomiting. Previous colonoscopy and upper ultrasound did not reveal any abnormalities. She experiences intermittent constipation and uses muscle relaxers for pain relief, though they provide little relief.  She lives in a multi-level home and has difficulty with the stairs, needing to climb thirteen steps to access her bathroom and bedroom. She has requested a letter to facilitate a move to a flat due to her mobility issues. She quit smoking approximately six to seven years ago.  Past Medical History:  Diagnosis Date   CAD in native artery 12/11/2023   COPD (chronic obstructive pulmonary disease) (HCC)    GI bleed    Hyperlipidemia    Hypertension    Tubular adenoma of colon 2013     Family History  Problem Relation Age of Onset   Heart attack Father    Diabetes Sister        x 2   Diabetes Brother      Social History   Socioeconomic History   Marital status: Single    Spouse name: Not on file   Number of children: 2   Years of education: Not on file   Highest education level: Not on file  Occupational History   Occupation: cleaning service  Tobacco Use   Smoking status: Former    Current packs/day: 0.20    Average packs/day: 0.2 packs/day for 11.3 years (2.3 ttl pk-yrs)    Types: Cigarettes    Start date: 2014  Smokeless tobacco: Never  Vaping Use   Vaping status: Every Day  Substance and Sexual Activity   Alcohol use: Yes    Comment: socially   Drug use: No   Sexual activity: Never  Other Topics Concern   Not on file  Social History Narrative   Not on file   Social Drivers of Health   Financial Resource Strain: High Risk (01/22/2024)   Overall Financial Resource Strain (CARDIA)    Difficulty of Paying Living Expenses: Hard  Food Insecurity: Food Insecurity Present (01/22/2024)   Hunger Vital Sign    Worried About Running Out of Food in the Last Year: Sometimes true     Ran Out of Food in the Last Year: Sometimes true  Transportation Needs: No Transportation Needs (12/11/2023)   PRAPARE - Administrator, Civil Service (Medical): No    Lack of Transportation (Non-Medical): No  Physical Activity: Not on File (11/14/2022)   Received from [place], [place]   Physical Activity    Physical Activity: 0  Stress: Not on File (11/14/2022)   Received from [facility], [place]   Stress    Stress: 0  Social Connections: Not on File (07/06/2023)   Received from [facility]   Social Connections    Connectedness: 0  Intimate Partner Violence: Unknown (01/31/2022)   Received from [facility], [facility]   HITS    Physically Hurt: Not on file    Insult or Talk Down To: Not on file    Threaten Physical Harm: Not on file    Scream or Curse: Not on file     Allergies  Allergen Reactions   Flagyl [Metronidazole]     unknown   Metronidazole Hcl Hives    Stomach cramps     Outpatient Medications Prior to Visit  Medication Sig Dispense Refill   albuterol (PROVENTIL) (2.5 MG/3ML) 0.083% nebulizer solution Take 3 mLs (2.5 mg total) by nebulization 3 (three) times daily as needed for wheezing or shortness of breath. 75 mL 0   amLODipine (NORVASC) 5 MG tablet Take 1 tablet (5 mg total) by mouth daily. 90 tablet 3   azelastine (ASTELIN) 0.1 % nasal spray Place 2 sprays into both nostrils 2 (two) times daily. Use in each nostril as directed     Blood Pressure Monitoring (BLOOD PRESSURE CUFF) MISC Check blood pressure daily for elevated blood pressure 1 each 0   cetirizine (ZYRTEC) 10 MG tablet Take 10 mg by mouth daily.     dicyclomine (BENTYL) 20 MG tablet Take 1 tablet (20 mg total) by mouth 2 (two) times daily. 20 tablet 0   FEROSUL 325 (65 Fe) MG tablet Take 325 mg by mouth daily.     hydrOXYzine (ATARAX) 25 MG tablet Take 25 mg by mouth every 6 (six) hours as needed (sleep).     irbesartan (AVAPRO) 150 MG tablet Take 1 tablet (150 mg total) by mouth daily. 30 tablet 3    levocetirizine (XYZAL) 5 MG tablet Take 1 tablet (5 mg total) by mouth every evening. 30 tablet 5   levothyroxine (SYNTHROID) 25 MCG tablet Take 25 mcg by mouth daily before breakfast.     montelukast (SINGULAIR) 10 MG tablet Take 1 tablet (10 mg total) by mouth at bedtime. 30 tablet 5   ondansetron (ZOFRAN-ODT) 4 MG disintegrating tablet Take 1 tablet (4 mg total) by mouth every 8 (eight) hours as needed for nausea or vomiting. 10 tablet 0   pantoprazole (PROTONIX) 40 MG tablet Take 40 mg  by mouth daily.     rosuvastatin (CRESTOR) 40 MG tablet Take 1 tablet (40 mg total) by mouth daily. 90 tablet 3   spironolactone (ALDACTONE) 25 MG tablet Take 1 tablet (25 mg total) by mouth daily. 90 tablet 3   tiZANidine (ZANAFLEX) 4 MG tablet Take 4 mg by mouth every 8 (eight) hours as needed.     triamcinolone cream (KENALOG) 0.1 % Apply 1 Application topically as needed.     valACYclovir (VALTREX) 1000 MG tablet Take 1,000 mg by mouth daily. For 5 days     albuterol (VENTOLIN HFA) 108 (90 Base) MCG/ACT inhaler Inhale 2 puffs into the lungs every 6 (six) hours as needed for wheezing or shortness of breath. 18 g 1   budeson-glycopyrrolate-formoterol (BREZTRI AEROSPHERE) 160-9-4.8 MCG/ACT AERO inhaler Inhale 2 puffs into the lungs 2 (two) times daily.     tiotropium (SPIRIVA) 18 MCG inhalation capsule Place 18 mcg into inhaler and inhale daily.     No facility-administered medications prior to visit.    Review of Systems  Constitutional:  Negative for chills, fever, malaise/fatigue and weight loss.  HENT:  Negative for congestion, sinus pain and sore throat.   Eyes: Negative.   Respiratory:  Positive for cough, sputum production and shortness of breath. Negative for hemoptysis and wheezing.   Cardiovascular:  Negative for chest pain, palpitations, orthopnea, claudication and leg swelling.  Gastrointestinal:  Positive for abdominal pain (right sided, intermittent). Negative for heartburn, nausea and  vomiting.  Genitourinary: Negative.   Musculoskeletal:  Negative for joint pain and myalgias.  Skin:  Negative for rash.  Neurological:  Positive for headaches. Negative for weakness.  Endo/Heme/Allergies: Negative.   Psychiatric/Behavioral: Negative.     Objective:   Vitals:   02/04/24 1307  BP: 125/74  Pulse: 66  SpO2: 93%  Weight: 150 lb (68 kg)  Height: 5\' 4"  (1.626 m)    Physical Exam Constitutional:      General: She is not in acute distress.    Appearance: She is not ill-appearing.  HENT:     Head: Normocephalic and atraumatic.  Eyes:     General: No scleral icterus. Cardiovascular:     Rate and Rhythm: Normal rate and regular rhythm.     Pulses: Normal pulses.     Heart sounds: Normal heart sounds. No murmur heard. Pulmonary:     Effort: Pulmonary effort is normal.     Breath sounds: Decreased air movement present. No wheezing, rhonchi or rales.  Musculoskeletal:     Right lower leg: No edema.     Left lower leg: No edema.  Skin:    General: Skin is warm and dry.  Neurological:     General: No focal deficit present.     Mental Status: She is alert.    CBC    Component Value Date/Time   WBC 8.0 12/18/2023 1926   RBC 4.17 12/18/2023 1926   HGB 13.0 12/18/2023 1926   HGB 14.1 09/05/2023 1219   HCT 39.7 12/18/2023 1926   HCT 42.5 09/05/2023 1219   PLT 327 12/18/2023 1926   PLT 326 09/05/2023 1219   MCV 95.2 12/18/2023 1926   MCV 91 09/05/2023 1219   MCH 31.2 12/18/2023 1926   MCHC 32.7 12/18/2023 1926   RDW 14.3 12/18/2023 1926   RDW 12.9 09/05/2023 1219   LYMPHSABS 3.6 12/18/2023 1926   MONOABS 0.6 12/18/2023 1926   EOSABS 0.4 12/18/2023 1926   BASOSABS 0.1 12/18/2023 1926  Latest Ref Rng & Units 01/29/2024    9:46 AM 12/18/2023    7:26 PM 12/01/2023    3:15 PM  BMP  Glucose 70 - 99 mg/dL 540  981  191   BUN 8 - 27 mg/dL 14  30  29    Creatinine 0.57 - 1.00 mg/dL 4.78  2.95  6.21   BUN/Creat Ratio 12 - 28 12     Sodium 134 - 144 mmol/L  141  140  142   Potassium 3.5 - 5.2 mmol/L 4.3  3.6  3.4   Chloride 96 - 106 mmol/L 102  106  106   CO2 20 - 29 mmol/L 25  24  27    Calcium 8.7 - 10.3 mg/dL 9.4  9.0  9.1    Chest imaging: CT Chest 03/31/23 Cardiovascular: There are no filling defects within the pulmonary arteries to suggest pulmonary embolus. Breathing motion artifact at the bases limits detailed assessment. The central pulmonary artery is mildly dilated at 3.2 cm as before. The heart is normal in size. Coronary artery calcifications. No pericardial effusion. Contrast refluxes into the hepatic veins and IVC. Aortic atherosclerosis without aneurysm or acute aortic findings.   Mediastinum/Nodes: Reactive appearing bilateral hilar nodes, not enlarged by size criteria. There is no mediastinal adenopathy. Esophagus is patulous.   Lungs/Pleura: Apical predominant emphysema. No acute or focal airspace disease. No pleural effusion, pneumothorax or features of pulmonary edema. No suspicious pulmonary nodule.  CTA Chest 12/15/21 Cardiovascular: Good contrast bolus timing in the pulmonary arterial tree. Mild enlargement of the central pulmonary arteries (series 7, image 188). Normal bilateral pulmonary artery enhancement.   No focal filling defect identified in the pulmonary arteries to suggest acute pulmonary embolism.   Calcified coronary artery atherosclerosis (series 7, image 228). No cardiomegaly or pericardial effusion. Negative visible aorta aside from atherosclerosis.   Mediastinum/Nodes: Negative. No mediastinal mass or lymphadenopathy.   Lungs/Pleura: Moderate to severe upper lobe centrilobular emphysema. Less pronounced middle and lower lobe emphysema. Large lung volumes. There is mild dependent retained secretions throughout the trachea, but the major airways remain patent. There is widespread bronchial wall thickening. No pleural effusion or evidence of acute pulmonary inflammation. No pulmonary  nodule.  PFT:     No data to display          Labs:  Path:  Echo: LV EF 60-65%. Grade I diastolic dysfunction. RV systolic function is normal.  Heart Catheterization:    Assessment & Plan:   Centrilobular emphysema (HCC) - Plan: AMB referral to pulmonary rehabilitation, Pulmonary Function Test, albuterol (VENTOLIN HFA) 108 (90 Base) MCG/ACT inhaler, budeson-glycopyrrolate-formoterol (BREZTRI AEROSPHERE) 160-9-4.8 MCG/ACT AERO inhaler  Former cigarette smoker  Discussion: Carine Nordgren is a 63 year old woman, former smoker with hypertension who returns to pulmonary clinic for COPD.   Chronic Obstructive Pulmonary Disease (COPD) Significant dyspnea with minimal exertion.  Discussed potential lung volume reduction surgery pending pulmonary function test results. - Refer to pulmonary rehab program. - Order pulmonary function tests. - Consider lung volume reduction procedure based on test results. - Advise using the nebulizer in the morning/evening before Breztri. - Refill Breztri and albuterol prescriptions.  Abdominal Pain Intermittent right-sided pain with nausea. Previous imaging unremarkable. Differential includes muscular pain or constipation-related discomfort vs gallbladder issues. LFTs and imaging without issue. - Consider further evaluation if symptoms persist by general surgery - Advise use of MiraLAX if constipation is suspected.  Housing Accommodation Needs Difficulty with stairs due to COPD, requiring relocation to a flat. -  Provide a letter to assist with housing accommodation to move to a flat.  Follow up in 3 months  Melody Comas, MD [facility] Office: [phone]   Current Outpatient Medications:    albuterol (PROVENTIL) (2.5 MG/3ML) 0.083% nebulizer solution, Take 3 mLs (2.5 mg total) by nebulization 3 (three) times daily as needed for wheezing or shortness of breath., Disp: 75 mL, Rfl: 0   albuterol (VENTOLIN HFA) 108 (90  Base) MCG/ACT inhaler, Inhale 2 puffs into the lungs every 6 (six) hours as needed for wheezing or shortness of breath., Disp: 8 g, Rfl: 11   amLODipine (NORVASC) 5 MG tablet, Take 1 tablet (5 mg total) by mouth daily., Disp: 90 tablet, Rfl: 3   azelastine (ASTELIN) 0.1 % nasal spray, Place 2 sprays into both nostrils 2 (two) times daily. Use in each nostril as directed, Disp: , Rfl:    Blood Pressure Monitoring (BLOOD PRESSURE CUFF) MISC, Check blood pressure daily for elevated blood pressure, Disp: 1 each, Rfl: 0   cetirizine (ZYRTEC) 10 MG tablet, Take 10 mg by mouth daily., Disp: , Rfl:    dicyclomine (BENTYL) 20 MG tablet, Take 1 tablet (20 mg total) by mouth 2 (two) times daily., Disp: 20 tablet, Rfl: 0   FEROSUL 325 (65 Fe) MG tablet, Take 325 mg by mouth daily., Disp: , Rfl:    hydrOXYzine (ATARAX) 25 MG tablet, Take 25 mg by mouth every 6 (six) hours as needed (sleep)., Disp: , Rfl:    irbesartan (AVAPRO) 150 MG tablet, Take 1 tablet (150 mg total) by mouth daily., Disp: 30 tablet, Rfl: 3   levocetirizine (XYZAL) 5 MG tablet, Take 1 tablet (5 mg total) by mouth every evening., Disp: 30 tablet, Rfl: 5   levothyroxine (SYNTHROID) 25 MCG tablet, Take 25 mcg by mouth daily before breakfast., Disp: , Rfl:    montelukast (SINGULAIR) 10 MG tablet, Take 1 tablet (10 mg total) by mouth at bedtime., Disp: 30 tablet, Rfl: 5   ondansetron (ZOFRAN-ODT) 4 MG disintegrating tablet, Take 1 tablet (4 mg total) by mouth every 8 (eight) hours as needed for nausea or vomiting., Disp: 10 tablet, Rfl: 0   pantoprazole (PROTONIX) 40 MG tablet, Take 40 mg by mouth daily., Disp: , Rfl:    rosuvastatin (CRESTOR) 40 MG tablet, Take 1 tablet (40 mg total) by mouth daily., Disp: 90 tablet, Rfl: 3   spironolactone (ALDACTONE) 25 MG tablet, Take 1 tablet (25 mg total) by mouth daily., Disp: 90 tablet, Rfl: 3   tiZANidine (ZANAFLEX) 4 MG tablet, Take 4 mg by mouth every 8 (eight) hours as needed., Disp: , Rfl:     triamcinolone cream (KENALOG) 0.1 %, Apply 1 Application topically as needed., Disp: , Rfl:    valACYclovir (VALTREX) 1000 MG tablet, Take 1,000 mg by mouth daily. For 5 days, Disp: , Rfl:    budeson-glycopyrrolate-formoterol (BREZTRI AEROSPHERE) 160-9-4.8 MCG/ACT AERO inhaler, Inhale 2 puffs into the lungs 2 (two) times daily., Disp: 10.7 g, Rfl: 11

## 2024-02-05 ENCOUNTER — Ambulatory Visit: Admitting: Podiatry

## 2024-02-05 ENCOUNTER — Encounter: Payer: Self-pay | Admitting: Pulmonary Disease

## 2024-02-06 NOTE — Telephone Encounter (Signed)
 Tried to get approval again and was denied, I have l/m for patient to contact me to advise will send consent to get her on bridge program for [facility]

## 2024-02-12 ENCOUNTER — Telehealth (HOSPITAL_COMMUNITY): Payer: Self-pay

## 2024-02-12 NOTE — Telephone Encounter (Signed)
 Pt insurance is active and benefits verified through Medicare A & B. Co-pay $0, DED $257/Unknown met, out of pocket $0/$0 met, co-insurance 20%. No pre-authorization required. 02/12/2024 @ 9am.  2ndary insurance is active and benefits verified through Medicaid. Co-pay $0, DED $0/$0 met, out of pocket $0/$0 met, co-insurance 0%. No pre-authorization required. 02/12/2024 @ 9am.

## 2024-02-12 NOTE — Telephone Encounter (Signed)
 Called patient to see if she is interested in the Pulmonary Rehab Program. Patient expressed interest. Explained scheduling process and went over insurance, patient verbalized understanding.   Patient will call back at the beginning of May after she has finished moving to get scheduled.

## 2024-02-12 NOTE — Telephone Encounter (Signed)
 Patient called back stating she wanted to get scheduled for PR program (3-way call with [facility]). Patient will come in for orientation on 5/21 and will attend the 1:15pm exercise class.  Pensions consultant.

## 2024-02-24 ENCOUNTER — Other Ambulatory Visit: Payer: Self-pay | Admitting: Allergy

## 2024-02-24 DIAGNOSIS — J432 Centrilobular emphysema: Secondary | ICD-10-CM

## 2024-02-26 ENCOUNTER — Telehealth: Payer: Self-pay

## 2024-02-26 ENCOUNTER — Other Ambulatory Visit (HOSPITAL_COMMUNITY): Payer: Self-pay

## 2024-02-26 NOTE — Telephone Encounter (Signed)
*  Asthma/Allergy  Pharmacy Patient Advocate Encounter   Received notification from CoverMyMeds that prior authorization for Breztri  Aerosphere 160-9-4.8MCG/ACT aerosol  is required/requested.   Insurance verification completed.   The patient is insured through [facility] MEDICAID .   Per test claim: PA required; PA started via CoverMyMeds. KEY N/A . Please see clinical question(s) below that I am not finding the answer to in her chart and advise.

## 2024-02-27 ENCOUNTER — Other Ambulatory Visit: Payer: Self-pay | Admitting: Allergy

## 2024-02-27 DIAGNOSIS — J432 Centrilobular emphysema: Secondary | ICD-10-CM

## 2024-02-27 NOTE — Telephone Encounter (Signed)
 Noted [facility] on [address] [phone] informed

## 2024-03-02 ENCOUNTER — Telehealth: Payer: Self-pay | Admitting: Acute Care

## 2024-03-02 DIAGNOSIS — Z122 Encounter for screening for malignant neoplasm of respiratory organs: Secondary | ICD-10-CM

## 2024-03-02 DIAGNOSIS — Z87891 Personal history of nicotine dependence: Secondary | ICD-10-CM

## 2024-03-02 NOTE — Telephone Encounter (Signed)
 Lung Cancer Screening Narrative/Criteria Questionnaire (Cigarette Smokers Only- No Cigars/Pipes/vapes)   Mariah Foster   SDMV:03/16/2024 at 12:00p [name]        May 17, 1961   LDCT: 03/17/2024 11:40pm GI    63 y.o.   Phone: [phone]  Lung Screening Narrative (confirm age 12-77 yrs Medicare / 50-80 yrs Private pay insurance)   Solicitor and mcd   Referring Provider:Dr. Diania Fortes   This screening involves an initial phone call with a team member from our program. It is called a shared decision making visit. The initial meeting is required by  insurance and Medicare to make sure you understand the program. This appointment takes about 15-20 minutes to complete. You will complete the screening scan at your scheduled date/time.  This scan takes about 5-10 minutes to complete. You can eat and drink normally before and after the scan.  Criteria questions for Lung Cancer Screening:   Are you a current or former smoker? Former Age began smoking: 63yo   If you are a former smoker, what year did you quit smoking? 2017(within 15 yrs)   To calculate your smoking history, I need an accurate estimate of how many packs of cigarettes you smoked per day and for how many years. (Not just the number of PPD you are now smoking)   Years smoking 41 x Packs per day 1 = Pack years 41   (at least 20 pack yrs)   (Make sure they understand that we need to know how much they have smoked in the past, not just the number of PPD they are smoking now)  Do you have a personal history of cancer?  No    Do you have a family history of cancer? No  Are you coughing up blood?  No  Have you had unexplained weight loss of 15 lbs or more in the last 6 months? No  It looks like you meet all criteria.  When would be a good time for us  to schedule you for this screening?   Additional information: N/A

## 2024-03-05 ENCOUNTER — Emergency Department (HOSPITAL_COMMUNITY)

## 2024-03-05 ENCOUNTER — Emergency Department (HOSPITAL_COMMUNITY)
Admission: EM | Admit: 2024-03-05 | Discharge: 2024-03-05 | Disposition: A | Discharge status: Home / Self Care | Attending: Emergency Medicine | Admitting: Emergency Medicine

## 2024-03-05 ENCOUNTER — Encounter (HOSPITAL_COMMUNITY): Payer: Self-pay

## 2024-03-05 ENCOUNTER — Other Ambulatory Visit (HOSPITAL_COMMUNITY): Payer: Self-pay | Admitting: Emergency Medicine

## 2024-03-05 ENCOUNTER — Other Ambulatory Visit: Payer: Self-pay

## 2024-03-05 DIAGNOSIS — J441 Chronic obstructive pulmonary disease with (acute) exacerbation: Secondary | ICD-10-CM | POA: Insufficient documentation

## 2024-03-05 DIAGNOSIS — N611 Abscess of the breast and nipple: Secondary | ICD-10-CM

## 2024-03-05 MED ORDER — PREDNISONE 20 MG PO TABS
60.0000 mg | ORAL_TABLET | Freq: Once | ORAL | Status: AC
  Administered 2024-03-05: 60 mg via ORAL
  Filled 2024-03-05: qty 3

## 2024-03-05 MED ORDER — ALBUTEROL SULFATE HFA 108 (90 BASE) MCG/ACT IN AERS
2.0000 | INHALATION_SPRAY | Freq: Once | RESPIRATORY_TRACT | Status: AC
  Administered 2024-03-05: 2 via RESPIRATORY_TRACT
  Filled 2024-03-05: qty 6.7

## 2024-03-05 MED ORDER — DOXYCYCLINE HYCLATE 100 MG PO TABS
100.0000 mg | ORAL_TABLET | Freq: Once | ORAL | Status: AC
  Administered 2024-03-05: 100 mg via ORAL
  Filled 2024-03-05: qty 1

## 2024-03-05 MED ORDER — PREDNISONE 20 MG PO TABS: ORAL_TABLET | ORAL | 0 refills | Status: DC

## 2024-03-05 MED ORDER — DOXYCYCLINE HYCLATE 100 MG PO CAPS: 100.0000 mg | ORAL_CAPSULE | Freq: Two times a day (BID) | ORAL | 0 refills | Status: DC

## 2024-03-05 MED ORDER — IPRATROPIUM-ALBUTEROL 0.5-2.5 (3) MG/3ML IN SOLN
6.0000 mL | Freq: Once | RESPIRATORY_TRACT | Status: AC
  Administered 2024-03-05: 6 mL via RESPIRATORY_TRACT
  Filled 2024-03-05: qty 6

## 2024-03-05 NOTE — ED Provider Notes (Signed)
 [place] [facility] Provider Note   CSN: [id number] Arrival date & time: 03/05/24  1127     History  Chief Complaint  Patient presents with   Difficulty Breathing   Abscess    Mariah Foster is a 63 y.o. female.  63 yo F with a chief complaints of difficulty breathing.  Going on for about a week.  Feels like her prior COPD exacerbations.  She has been using her inhalers at home but without significant improvement.  She decided come in today for evaluation.  She also has a lesion to her left breast.  Had it drained by her PCP but has returned.  No fevers.  No drainage.   Abscess      Home Medications Prior to Admission medications   Medication Sig Start Date End Date Taking? Authorizing Provider  doxycycline  (VIBRAMYCIN ) 100 MG capsule Take 1 capsule (100 mg total) by mouth 2 (two) times daily. One po bid x 7 days 03/05/24  Yes Kealii Thueson, DO  predniSONE  (DELTASONE ) 20 MG tablet 2 tabs po daily x 4 days 03/05/24  Yes Albertus Hughs, DO  albuterol  (PROVENTIL ) (2.5 MG/3ML) 0.083% nebulizer solution Take 3 mLs (2.5 mg total) by nebulization 3 (three) times daily as needed for wheezing or shortness of breath. 08/29/23   Brian Campanile, MD  albuterol  (VENTOLIN  HFA) [phone] Base) MCG/ACT inhaler Inhale 2 puffs into the lungs every 6 (six) hours as needed for wheezing or shortness of breath. 02/04/24   Wilfredo Hanly, MD  amLODipine  (NORVASC ) 5 MG tablet Take 1 tablet (5 mg total) by mouth daily. 01/22/24   Maudine Sos, MD  azelastine  (ASTELIN ) 0.1 % nasal spray Place 2 sprays into both nostrils 2 (two) times daily. Use in each nostril as directed    [provider]  Blood Pressure Monitoring (BLOOD PRESSURE CUFF) MISC Check blood pressure daily for elevated blood pressure 01/22/24   Maudine Sos, MD  budeson-glycopyrrolate-formoterol (BREZTRI  AEROSPHERE) 160-9-4.8 MCG/ACT AERO inhaler Inhale 2 puffs into the lungs 2 (two) times  daily. 02/04/24   Wilfredo Hanly, MD  cetirizine (ZYRTEC) 10 MG tablet Take 10 mg by mouth daily. 11/09/23   [provider]  dicyclomine  (BENTYL ) 20 MG tablet Take 1 tablet (20 mg total) by mouth 2 (two) times daily. 12/01/23   Henderly, Britni A, PA-C  FEROSUL 325 (65 Fe) MG tablet Take 325 mg by mouth daily. 11/09/23   [provider]  hydrOXYzine  (ATARAX ) 25 MG tablet Take 25 mg by mouth every 6 (six) hours as needed (sleep). 11/26/23   [provider]  irbesartan  (AVAPRO ) 150 MG tablet Take 1 tablet (150 mg total) by mouth daily. 01/22/24   Maudine Sos, MD  levocetirizine (XYZAL ) 5 MG tablet Take 1 tablet (5 mg total) by mouth every evening. 12/05/23   Brian Campanile, MD  levothyroxine  (SYNTHROID ) 25 MCG tablet Take 25 mcg by mouth daily before breakfast. 10/07/23   [provider]  montelukast  (SINGULAIR ) 10 MG tablet Take 1 tablet (10 mg total) by mouth at bedtime. 12/05/23   Brian Campanile, MD  ondansetron  (ZOFRAN -ODT) 4 MG disintegrating tablet Take 1 tablet (4 mg total) by mouth every 8 (eight) hours as needed for nausea or vomiting. 11/23/23   Carleton Cheek, PA-C  pantoprazole  (PROTONIX ) 40 MG tablet Take 40 mg by mouth daily. 12/06/23 12/05/24  [provider]  rosuvastatin  (CRESTOR ) 40 MG tablet Take 1 tablet (40 mg total) by mouth  daily. 01/22/24 04/21/24  Maudine Sos, MD  spironolactone  (ALDACTONE ) 25 MG tablet Take 1 tablet (25 mg total) by mouth daily. 01/22/24 04/21/24  Maudine Sos, MD  tiZANidine (ZANAFLEX) 4 MG tablet Take 4 mg by mouth every 8 (eight) hours as needed. 10/04/23   [provider]  triamcinolone cream (KENALOG) 0.1 % Apply 1 Application topically as needed.    [provider]  valACYclovir  (VALTREX ) 1000 MG tablet Take 1,000 mg by mouth daily. For 5 days    [provider]  potassium chloride  (K-DUR) 10 MEQ tablet Take 1 tablet (10 mEq total) by mouth daily. 03/14/17 05/26/19   Layden, Lindsey A, PA-C      Allergies    Flagyl [metronidazole] and Metronidazole hcl    Review of Systems   Review of Systems  Physical Exam Updated Vital Signs BP 125/74 (BP Location: Left Arm)   Pulse 73   Temp 97.8 F (36.6 C)   Resp (!) 22   Ht 5\' 6"  (1.676 m)   Wt 65.8 kg   SpO2 96%   BMI 23.40 kg/m  Physical Exam Vitals and nursing note reviewed.  Constitutional:      General: She is not in acute distress.    Appearance: She is well-developed. She is not diaphoretic.  HENT:     Head: Normocephalic and atraumatic.  Eyes:     Pupils: Pupils are equal, round, and reactive to light.  Cardiovascular:     Rate and Rhythm: Normal rate and regular rhythm.     Heart sounds: No murmur heard.    No friction rub. No gallop.  Pulmonary:     Effort: Pulmonary effort is normal.     Breath sounds: Wheezing present. No rales.     Comments: Diminished breath sounds in all fields with prolonged expiratory effort.  Faint end expiratory wheezes. Chest:       Comments: Patient has what clinically looks like an abscess along the left breast.  Fluctuant.  No surrounding induration no drainage. Abdominal:     General: There is no distension.     Palpations: Abdomen is soft.     Tenderness: There is no abdominal tenderness.  Musculoskeletal:        General: No tenderness.     Cervical back: Normal range of motion and neck supple.  Skin:    General: Skin is warm and dry.  Neurological:     Mental Status: She is alert and oriented to person, place, and time.  Psychiatric:        Behavior: Behavior normal.     ED Results / Procedures / Treatments   Labs (all labs ordered are listed, but only abnormal results are displayed) Labs Reviewed - No data to display  EKG None  Radiology DG Chest [facility] 1 View Result Date: 03/05/2024 CLINICAL DATA:  Shortness of breath.  COPD. EXAM: PORTABLE CHEST 1 VIEW COMPARISON:  Chest radiograph dated 12/01/2023. FINDINGS: Background of  emphysema. No focal consolidation, pleural effusion or pneumothorax. The cardiac silhouette is within normal limits. Atherosclerotic calcification of the aortic arch. No acute osseous pathology. IMPRESSION: 1. No active disease. 2. Emphysema. Electronically Signed   By: Angus Bark M.D.   On: 03/05/2024 12:29    Procedures Procedures    Medications Ordered in ED Medications  albuterol  (VENTOLIN  HFA) 108 (90 Base) MCG/ACT inhaler 2 puff (has no administration in time range)  ipratropium-albuterol  (DUONEB) 0.5-2.5 (3) MG/3ML nebulizer solution 6 mL (6 mLs Nebulization Given 03/05/24 1207)  predniSONE  (DELTASONE ) tablet 60 mg (60 mg Oral Given 03/05/24 1203)  doxycycline  (VIBRA -TABS) tablet 100 mg (100 mg Oral Given 03/05/24 1203)    ED Course/ Medical Decision Making/ A&P                                 Medical Decision Making Amount and/or Complexity of Data Reviewed Radiology: ordered.  Risk Prescription drug management.   63 yo F with a chief complaints of difficulty breathing.  Feels like a prior COPD exacerbation.  Given 2 DuoNebs back-to-back steroids.  Patient feels quite a bit better on repeat assessment.  Chest x-ray independently interpreted by me without focal infiltrate or pneumothorax.  Patient has a second complaints of what looks like an abscess to the left breast.  Given information to follow-up with the breast center.  Course of antibiotics.  Warm compresses.  12:43 PM:  I have discussed the diagnosis/risks/treatment options with the patient.  Evaluation and diagnostic testing in the emergency department does not suggest an emergent condition requiring admission or immediate intervention beyond what has been performed at this time.  They will follow up with PCP. We also discussed returning to the ED immediately if new or worsening sx occur. We discussed the sx which are most concerning (e.g., sudden worsening pain, fever, inability to tolerate by mouth, need to use  inhaler more often than every 4 hours, sob) that necessitate immediate return. Medications administered to the patient during their visit and any new prescriptions provided to the patient are listed below.  Medications given during this visit Medications  albuterol  (VENTOLIN  HFA) 108 (90 Base) MCG/ACT inhaler 2 puff (has no administration in time range)  ipratropium-albuterol  (DUONEB) 0.5-2.5 (3) MG/3ML nebulizer solution 6 mL (6 mLs Nebulization Given 03/05/24 1207)  predniSONE  (DELTASONE ) tablet 60 mg (60 mg Oral Given 03/05/24 1203)  doxycycline  (VIBRA -TABS) tablet 100 mg (100 mg Oral Given 03/05/24 1203)     The patient appears reasonably screen and/or stabilized for discharge and I doubt any other medical condition or other [facility] requiring further screening, evaluation, or treatment in the ED at this time prior to discharge.          Final Clinical Impression(s) / ED Diagnoses Final diagnoses:  COPD with acute exacerbation (HCC)    Rx / DC Orders ED Discharge Orders          Ordered    US  BREAST ASPIRATION LEFT        03/05/24 1201    doxycycline  (VIBRAMYCIN ) 100 MG capsule  2 times daily        03/05/24 1201    predniSONE  (DELTASONE ) 20 MG tablet        03/05/24 1201              [place], DO 03/05/24 1243

## 2024-03-05 NOTE — Discharge Instructions (Addendum)
 Use your inhaler every 4 hours(4 puffs) while awake, return for sudden worsening shortness of breath, or if you need to use your inhaler more often.   Follow-up with the breast center.  Warm compresses at least 4 times a day.

## 2024-03-05 NOTE — ED Triage Notes (Signed)
 Pt came in via POV d/t difficulty breathing the past week & states with her COPD the pollen has made it worse & her home inhalers help some, does NOT wear home O2, 96% on RA in triage. Also reports an abscess on her Lt breast that her PCP has drained some puss from recently & it is now red & sore again. A/Ox4, denies pain.

## 2024-03-16 ENCOUNTER — Encounter: Payer: Self-pay | Admitting: *Deleted

## 2024-03-16 ENCOUNTER — Ambulatory Visit: Admitting: *Deleted

## 2024-03-16 DIAGNOSIS — Z87891 Personal history of nicotine dependence: Secondary | ICD-10-CM

## 2024-03-16 NOTE — Progress Notes (Signed)
 Virtual Visit via Video Note  I connected with [name] on 03/16/24 at 12:00 PM EDT by a video enabled telemedicine application and verified that I am speaking with the correct person using two identifiers.  Location: Patient: Mariah Foster  Provider: Alyse Bach, RN   I discussed the limitations of evaluation and management by telemedicine and the availability of in person appointments. The patient expressed understanding and agreed to proceed.     Shared Decision Making Visit Lung Cancer Screening Program [id number])   Eligibility: Age 63 y.o. Pack Years Smoking History Calculation 40 (# packs/per year x # years smoked) Recent History of coughing up blood  no Unexplained weight loss? no ( >Than 15 pounds within the last 6 months ) Prior History Lung / other cancer no (Diagnosis within the last 5 years already requiring surveillance chest CT Scans). Smoking Status Former Smoker Former Smokers: Years since quit: 7 years  Quit Date: 2017  Visit Components: Discussion included one or more decision making aids. yes Discussion included risk/benefits of screening. yes Discussion included potential follow up diagnostic testing for abnormal scans. yes Discussion included meaning and risk of over diagnosis. yes Discussion included meaning and risk of False Positives. yes Discussion included meaning of total radiation exposure. yes  Counseling Included: Importance of adherence to annual lung cancer LDCT screening. yes Impact of comorbidities on ability to participate in the program. yes Ability and willingness to under diagnostic treatment. yes  Smoking Cessation Counseling: Current Smokers:  Discussed importance of smoking cessation. yes Information about tobacco cessation classes and interventions provided to patient. yes Patient provided with "ticket" for LDCT Scan. no Symptomatic Patient. no  Counseling(Intermediate counseling: > three minutes) 99406 Diagnosis  Code: Tobacco Use Z72.0 Asymptomatic Patient yes  Counseling (Intermediate counseling: > three minutes counseling) U0454 Former Smokers:  Discussed the importance of maintaining cigarette abstinence. yes Diagnosis Code: Personal History of Nicotine Dependence. U98.119 Information about tobacco cessation classes and interventions provided to patient. Yes Patient provided with "ticket" for LDCT Scan. no Written Order for Lung Cancer Screening with LDCT placed in Epic. Yes (CT Chest Lung Cancer Screening Low Dose W/O CM) [id number] Z12.2-Screening of respiratory organs Z87.891-Personal history of nicotine dependence   Alyse Bach, RN

## 2024-03-16 NOTE — Patient Instructions (Signed)

## 2024-03-17 ENCOUNTER — Telehealth (HOSPITAL_COMMUNITY): Payer: Self-pay

## 2024-03-17 ENCOUNTER — Inpatient Hospital Stay: Admission: RE | Admit: 2024-03-17 | Source: Ambulatory Visit

## 2024-03-17 NOTE — Telephone Encounter (Signed)
 Called to confirm appt. Pt confirmed appt. Instructed pt on proper footwear. Gave directions along with department number.

## 2024-03-18 ENCOUNTER — Telehealth (HOSPITAL_COMMUNITY): Payer: Self-pay

## 2024-03-18 ENCOUNTER — Inpatient Hospital Stay (HOSPITAL_COMMUNITY): Admission: RE | Admit: 2024-03-18 | Source: Ambulatory Visit

## 2024-03-18 NOTE — Telephone Encounter (Signed)
 Patient left message stating she is calling out for 1pm pulmonary orientation, states her transportation cancelled on her today. Will reschedule.

## 2024-03-18 NOTE — Telephone Encounter (Signed)
 Called patient regarding rescheduling Pulmonary Rehab orientation. Patient will attend orientation on 6/06 @ 1:00pm, and will attend 1:15pm class starting 6/12.

## 2024-03-24 ENCOUNTER — Telehealth (HOSPITAL_COMMUNITY): Payer: Self-pay

## 2024-03-24 ENCOUNTER — Ambulatory Visit (HOSPITAL_COMMUNITY)

## 2024-03-24 ENCOUNTER — Ambulatory Visit
Admission: RE | Admit: 2024-03-24 | Discharge: 2024-03-24 | Disposition: A | Discharge status: Home / Self Care | Source: Ambulatory Visit | Attending: Acute Care | Admitting: Acute Care

## 2024-03-24 DIAGNOSIS — Z122 Encounter for screening for malignant neoplasm of respiratory organs: Secondary | ICD-10-CM

## 2024-03-24 DIAGNOSIS — Z87891 Personal history of nicotine dependence: Secondary | ICD-10-CM

## 2024-03-26 ENCOUNTER — Encounter (HOSPITAL_BASED_OUTPATIENT_CLINIC_OR_DEPARTMENT_OTHER): Admitting: Family

## 2024-03-26 ENCOUNTER — Ambulatory Visit (HOSPITAL_COMMUNITY)

## 2024-03-31 ENCOUNTER — Ambulatory Visit (HOSPITAL_COMMUNITY)

## 2024-04-02 ENCOUNTER — Telehealth (HOSPITAL_COMMUNITY): Payer: Self-pay

## 2024-04-02 ENCOUNTER — Ambulatory Visit (HOSPITAL_COMMUNITY)

## 2024-04-02 NOTE — Telephone Encounter (Signed)
 Called pt to confirm PR appointment for 04/03/24. No answer. Left VM.

## 2024-04-03 ENCOUNTER — Encounter (HOSPITAL_COMMUNITY): Payer: Self-pay

## 2024-04-03 ENCOUNTER — Ambulatory Visit: Payer: Medicaid Other | Admitting: Allergy

## 2024-04-03 ENCOUNTER — Encounter (HOSPITAL_COMMUNITY)
Admission: RE | Admit: 2024-04-03 | Discharge: 2024-04-03 | Disposition: A | Discharge status: Home / Self Care | Source: Ambulatory Visit | Attending: Pulmonary Disease | Admitting: Pulmonary Disease

## 2024-04-03 VITALS — BP 120/64 | HR 64 | Wt 147.9 lb

## 2024-04-03 DIAGNOSIS — J432 Centrilobular emphysema: Secondary | ICD-10-CM | POA: Diagnosis present

## 2024-04-03 NOTE — Progress Notes (Signed)
 Mariah Foster 63 y.o. female Pulmonary Rehab Orientation Note This patient who was referred to Pulmonary Rehab by Dr. Diania Fortes with the diagnosis of Centrilobular Emphysema arrived today in Cardiac and Pulmonary Rehab. She  arrived ambulatory with normal gait. She  does not carry portable oxygen.   Color good, skin warm and dry. Patient is oriented to time and place. Patient's medical history, psychosocial health, and medications reviewed. Psychosocial assessment reveals patient lives with alone. Mariah Foster is currently retired. Patient hobbies include watching tv. Patient reports her stress level is low. Areas of stress/anxiety include health. Patient does exhibit signs of depression. Signs of depression include sadness and difficulty falling asleep. PHQ2/9 score 2/6. Mariah Foster shows fair  coping skills with positive outlook on life. Offered emotional support and reassurance. Will continue to monitor and evaluate progress toward psychosocial goal(s) of increasing sleep.  Physical assessment performed by Mariah Luo RN. Please see their orientation physical assessment note. Mariah Foster reports she does take medications as prescribed. Patient states she follows a regular  diet. The patient reports no specific efforts to gain or lose weight.. Patient's weight will be monitored closely.   Demonstration and practice of PLB using pulse oximeter. Mariah Foster able to return demonstration satisfactorily. Safety and hand hygiene in the exercise area reviewed with patient. Mariah Foster voices understanding of the information reviewed. Department expectations discussed with patient and achievable goals were set. The patient shows enthusiasm about attending the program and we look forward to working with Mariah Foster. Mariah Foster completed a 6 min walk test today and is scheduled to begin exercise on 04/09/24 at 1:15.   [id number] Mariah Foster, BS, ACSM-CEP

## 2024-04-03 NOTE — Progress Notes (Signed)
 Pulmonary Individual Treatment Plan  Patient Details  Name: Mariah Foster MRN: [id number] Date of Birth: January 14, 1961 Referring Provider:   [name] Pulmonary Rehab Walk Test from 04/03/2024 in [facility]  Referring Provider [name]       Initial Encounter Date:  Flowsheet Row Pulmonary Rehab Walk Test from 04/03/2024 in [facility]  Date 04/03/24       Visit Diagnosis: Centrilobular emphysema (HCC)  Patient's Home Medications on Admission:   Current Outpatient Medications:    albuterol  (PROVENTIL ) (2.5 MG/3ML) 0.083% nebulizer solution, Take 3 mLs (2.5 mg total) by nebulization 3 (three) times daily as needed for wheezing or shortness of breath., Disp: 75 mL, Rfl: 0   albuterol  (VENTOLIN  HFA) 108 (90 Base) MCG/ACT inhaler, Inhale 2 puffs into the lungs every 6 (six) hours as needed for wheezing or shortness of breath., Disp: 8 g, Rfl: 11   amLODipine  (NORVASC ) 5 MG tablet, Take 1 tablet (5 mg total) by mouth daily., Disp: 90 tablet, Rfl: 3   azelastine  (ASTELIN ) 0.1 % nasal spray, Place 2 sprays into both nostrils 2 (two) times daily. Use in each nostril as directed, Disp: , Rfl:    Blood Pressure Monitoring (BLOOD PRESSURE CUFF) MISC, Check blood pressure daily for elevated blood pressure, Disp: 1 each, Rfl: 0   budeson-glycopyrrolate-formoterol (BREZTRI  AEROSPHERE) 160-9-4.8 MCG/ACT AERO inhaler, Inhale 2 puffs into the lungs 2 (two) times daily., Disp: 10.7 g, Rfl: 11   cetirizine (ZYRTEC) 10 MG tablet, Take 10 mg by mouth daily., Disp: , Rfl:    dicyclomine  (BENTYL ) 20 MG tablet, Take 1 tablet (20 mg total) by mouth 2 (two) times daily., Disp: 20 tablet, Rfl: 0   FEROSUL 325 (65 Fe) MG tablet, Take 325 mg by mouth daily., Disp: , Rfl:    hydrOXYzine  (ATARAX ) 25 MG tablet, Take 25 mg by mouth every 6 (six) hours as needed (sleep)., Disp: , Rfl:    irbesartan   (AVAPRO ) 150 MG tablet, Take 1 tablet (150 mg total) by mouth daily., Disp: 30 tablet, Rfl: 3   levocetirizine (XYZAL ) 5 MG tablet, Take 1 tablet (5 mg total) by mouth every evening., Disp: 30 tablet, Rfl: 5   levothyroxine  (SYNTHROID ) 25 MCG tablet, Take 25 mcg by mouth daily before breakfast., Disp: , Rfl:    montelukast  (SINGULAIR ) 10 MG tablet, Take 1 tablet (10 mg total) by mouth at bedtime., Disp: 30 tablet, Rfl: 5   ondansetron  (ZOFRAN -ODT) 4 MG disintegrating tablet, Take 1 tablet (4 mg total) by mouth every 8 (eight) hours as needed for nausea or vomiting., Disp: 10 tablet, Rfl: 0   pantoprazole  (PROTONIX ) 40 MG tablet, Take 40 mg by mouth daily., Disp: , Rfl:    rosuvastatin  (CRESTOR ) 40 MG tablet, Take 1 tablet (40 mg total) by mouth daily., Disp: 90 tablet, Rfl: 3   spironolactone  (ALDACTONE ) 25 MG tablet, Take 1 tablet (25 mg total) by mouth daily., Disp: 90 tablet, Rfl: 3   tiZANidine (ZANAFLEX) 4 MG tablet, Take 4 mg by mouth every 8 (eight) hours as needed., Disp: , Rfl:    triamcinolone cream (KENALOG) 0.1 %, Apply 1 Application topically as needed., Disp: , Rfl:    doxycycline  (VIBRAMYCIN ) 100 MG capsule, Take 1 capsule (100 mg total) by mouth 2 (two) times daily. One po bid x 7 days (Patient not taking: Reported on 04/03/2024), Disp: 14 capsule, Rfl: 0   predniSONE  (DELTASONE )  20 MG tablet, 2 tabs po daily x 4 days (Patient not taking: Reported on 04/03/2024), Disp: 8 tablet, Rfl: 0   valACYclovir  (VALTREX ) 1000 MG tablet, Take 1,000 mg by mouth daily. For 5 days (Patient not taking: Reported on 04/03/2024), Disp: , Rfl:   Past Medical History: Past Medical History:  Diagnosis Date   CAD in native artery 12/11/2023   COPD (chronic obstructive pulmonary disease) (HCC)    GI bleed    Hyperlipidemia    Hypertension    Tubular adenoma of colon 2013    Tobacco Use: Social History   Tobacco Use  Smoking Status Former   Current packs/day: 0.00   Average packs/day: 1 pack/day for  40.0 years (40.0 ttl pk-yrs)   Types: Cigarettes   Start date: 39   Quit date: 2017   Years since quitting: 8.4  Smokeless Tobacco Never    Labs: Review Flowsheet       Latest Ref Rng & Units 02/26/2023 03/31/2023 04/01/2023  Labs for ITP Cardiac and Pulmonary Rehab  Cholestrol 100 - 199 mg/dL 295  - -  LDL (calc) 0 - 99 mg/dL 284  - -  HDL-C >13 mg/dL 60  - -  Trlycerides 0 - 149 mg/dL 244  - -  Bicarbonate 01.0 - 28.0 mmol/L - 19.8  22.5   TCO2 22 - 32 mmol/L - 21  24   Acid-base deficit 0.0 - 2.0 mmol/L - 3.0  3.0   O2 Saturation % - 77  69     Capillary Blood Glucose: Lab Results  Component Value Date   GLUCAP 101 (H) 03/14/2017   GLUCAP 117 (H) 01/17/2012     Pulmonary Assessment Scores:  Pulmonary Assessment Scores     Row Name 04/03/24 1346         ADL UCSD   ADL Phase Entry     SOB Score total 20       CAT Score   CAT Score 19       mMRC Score   mMRC Score 2             UCSD: Self-administered rating of dyspnea associated with activities of daily living (ADLs) 6-point scale (0 = "not at all" to 5 = "maximal or unable to do because of breathlessness")  Scoring Scores range from 0 to 120.  Minimally important difference is 5 units  CAT: CAT can identify the health impairment of COPD patients and is better correlated with disease progression.  CAT has a scoring range of zero to 40. The CAT score is classified into four groups of low (less than 10), medium (10 - 20), high (21-30) and very high (31-40) based on the impact level of disease on health status. A CAT score over 10 suggests significant symptoms.  A worsening CAT score could be explained by an exacerbation, poor medication adherence, poor inhaler technique, or progression of COPD or comorbid conditions.  CAT MCID is 2 points  mMRC: mMRC (Modified Medical Research Council) Dyspnea Scale is used to assess the degree of baseline functional disability in patients of respiratory disease due to  dyspnea. No minimal important difference is established. A decrease in score of 1 point or greater is considered a positive change.   Pulmonary Function Assessment:  Pulmonary Function Assessment - 04/03/24 1359       Breath   Bilateral Breath Sounds Decreased    Shortness of Breath Yes;Limiting activity  Exercise Target Goals: Exercise Program Goal: Individual exercise prescription set using results from initial 6 min walk test and THRR while considering  patient's activity barriers and safety.   Exercise Prescription Goal: Initial exercise prescription builds to 30-45 minutes a day of aerobic activity, 2-3 days per week.  Home exercise guidelines will be given to patient during program as part of exercise prescription that the participant will acknowledge.  Activity Barriers & Risk Stratification:  Activity Barriers & Cardiac Risk Stratification - 04/03/24 1347       Activity Barriers & Cardiac Risk Stratification   Activity Barriers Deconditioning;Muscular Weakness;Shortness of Breath;Arthritis    Cardiac Risk Stratification High             6 Minute Walk:  6 Minute Walk     Row Name 04/03/24 1445         6 Minute Walk   Phase Initial     Distance 1200 feet     Walk Time 6 minutes     # of Rest Breaks 1  3:11-3:39 rest due to SOB and brief 87 RA     MPH 2.27     METS 3.64     RPE 13     Perceived Dyspnea  1     VO2 Peak 12.74     Symptoms Yes (comment)     Comments Short of breath     Resting HR 55 bpm     Resting BP 120/64     Resting Oxygen Saturation  98 %     Exercise Oxygen Saturation  during 6 min walk 89 %     Max Ex. HR 122 bpm     Max Ex. BP 178/82     2 Minute Post BP 162/78       Interval HR   1 Minute HR 64     2 Minute HR 62  inaccurate     3 Minute HR 121     4 Minute HR 68     5 Minute HR 70     6 Minute HR 122     2 Minute Post HR 73     Interval Heart Rate? Yes       Interval Oxygen   Interval Oxygen? Yes      Baseline Oxygen Saturation % 98 %     1 Minute Oxygen Saturation % 96 %     1 Minute Liters of Oxygen 0 L     2 Minute Oxygen Saturation % 93 %     2 Minute Liters of Oxygen 0 L     3 Minute Oxygen Saturation % 89 %     3 Minute Liters of Oxygen 0 L     4 Minute Oxygen Saturation % 96 %     4 Minute Liters of Oxygen 0 L     5 Minute Oxygen Saturation % 94 %     5 Minute Liters of Oxygen 0 L     6 Minute Oxygen Saturation % 99 %     6 Minute Liters of Oxygen 0 L     2 Minute Post Oxygen Saturation % 98 %     2 Minute Post Liters of Oxygen 0 L              Oxygen Initial Assessment:  Oxygen Initial Assessment - 04/03/24 1345       Home Oxygen   Home Oxygen Device None    Sleep Oxygen Prescription None  Home Exercise Oxygen Prescription None    Home Resting Oxygen Prescription None    Compliance with Home Oxygen Use Yes      Initial 6 min Walk   Oxygen Used None      Program Oxygen Prescription   Program Oxygen Prescription None      Intervention   Short Term Goals To learn and exhibit compliance with exercise, home and travel O2 prescription;To learn and understand importance of maintaining oxygen saturations>88%;To learn and demonstrate proper use of respiratory medications;To learn and understand importance of monitoring SPO2 with pulse oximeter and demonstrate accurate use of the pulse oximeter.;To learn and demonstrate proper pursed lip breathing techniques or other breathing techniques. ;Other    Long  Term Goals Exhibits compliance with exercise, home  and travel O2 prescription;Verbalizes importance of monitoring SPO2 with pulse oximeter and return demonstration;Maintenance of O2 saturations>88%;Exhibits proper breathing techniques, such as pursed lip breathing or other method taught during program session;Compliance with respiratory medication;Demonstrates proper use of MDI's             Oxygen Re-Evaluation:   Oxygen Discharge (Final Oxygen  Re-Evaluation):   Initial Exercise Prescription:  Initial Exercise Prescription - 04/03/24 1400       Date of Initial Exercise RX and Referring Provider   Date 04/03/24    Referring Provider [name]    Expected Discharge Date 06/30/24      NuStep   Level 2    SPM 70    Minutes 15    METs 2.3      Track   Laps 11    Minutes 15    METs 2.2      Prescription Details   Frequency (times per week) 2    Duration Progress to 30 minutes of continuous aerobic without signs/symptoms of physical distress      Intensity   THRR 40-80% of Max Heartrate 62-124    Ratings of Perceived Exertion 11-13    Perceived Dyspnea 0-4      Progression   Progression Continue to progress workloads to maintain intensity without signs/symptoms of physical distress.      Resistance Training   Training Prescription Yes    Weight blue bands    Reps 10-15             Perform Capillary Blood Glucose checks as needed.  Exercise Prescription Changes:   Exercise Comments:   Exercise Goals and Review:   Exercise Goals     Row Name 04/03/24 1348             Exercise Goals   Increase Physical Activity Yes       Intervention Provide advice, education, support and counseling about physical activity/exercise needs.;Develop an individualized exercise prescription for aerobic and resistive training based on initial evaluation findings, risk stratification, comorbidities and participant's personal goals.       Expected Outcomes Short Term: Attend rehab on a regular basis to increase amount of physical activity.;Long Term: Add in home exercise to make exercise part of routine and to increase amount of physical activity.;Long Term: Exercising regularly at least 3-5 days a week.       Increase Strength and Stamina Yes       Intervention Provide advice, education, support and counseling about physical activity/exercise needs.;Develop an individualized exercise prescription for aerobic and resistive  training based on initial evaluation findings, risk stratification, comorbidities and participant's personal goals.       Expected Outcomes Short Term: Increase workloads from initial  exercise prescription for resistance, speed, and METs.;Short Term: Perform resistance training exercises routinely during rehab and add in resistance training at home;Long Term: Improve cardiorespiratory fitness, muscular endurance and strength as measured by increased METs and functional capacity ( )       Able to understand and use rate of perceived exertion (RPE) scale Yes       Intervention Provide education and explanation on how to use RPE scale       Expected Outcomes Short Term: Able to use RPE daily in rehab to express subjective intensity level;Long Term:  Able to use RPE to guide intensity level when exercising independently       Able to understand and use Dyspnea scale Yes       Intervention Provide education and explanation on how to use Dyspnea scale       Expected Outcomes Short Term: Able to use Dyspnea scale daily in rehab to express subjective sense of shortness of breath during exertion;Long Term: Able to use Dyspnea scale to guide intensity level when exercising independently       Knowledge and understanding of Target Heart Rate Range (THRR) Yes       Intervention Provide education and explanation of THRR including how the numbers were predicted and where they are located for reference       Expected Outcomes Short Term: Able to state/look up THRR;Long Term: Able to use THRR to govern intensity when exercising independently;Short Term: Able to use daily as guideline for intensity in rehab       Understanding of Exercise Prescription Yes       Intervention Provide education, explanation, and written materials on patient's individual exercise prescription       Expected Outcomes Short Term: Able to explain program exercise prescription;Long Term: Able to explain home exercise prescription to exercise  independently                Exercise Goals Re-Evaluation :   Discharge Exercise Prescription (Final Exercise Prescription Changes):   Nutrition:  Target Goals: Understanding of nutrition guidelines, daily intake of sodium 1500mg , cholesterol 200mg , calories 30% from fat and 7% or less from saturated fats, daily to have 5 or more servings of fruits and vegetables.  Biometrics:  Pre Biometrics - 04/03/24 1454       Pre Biometrics   Grip Strength 38 kg   old dynometer             Nutrition Therapy Plan and Nutrition Goals:   Nutrition Assessments:  MEDIFICTS Score Key: >=70 Need to make dietary changes  40-70 Heart Healthy Diet <= 40 Therapeutic Level Cholesterol Diet   Picture Your Plate Scores: <16 Unhealthy dietary pattern with much room for improvement. 41-50 Dietary pattern unlikely to meet recommendations for good health and room for improvement. 51-60 More healthful dietary pattern, with some room for improvement.  >60 Healthy dietary pattern, although there may be some specific behaviors that could be improved.    Nutrition Goals Re-Evaluation:   Nutrition Goals Discharge (Final Nutrition Goals Re-Evaluation):   Psychosocial: Target Goals: Acknowledge presence or absence of significant depression and/or stress, maximize coping skills, provide positive support system. Participant is able to verbalize types and ability to use techniques and skills needed for reducing stress and depression.  Initial Review & Psychosocial Screening:  Initial Psych Review & Screening - 04/03/24 1318       Initial Review   Current issues with Current Sleep Concerns   can't fall asleep until 7  am, sleeps 2 hrs.     Family Dynamics   Good Support System? Yes    Comments son Mariah Foster      Barriers   Psychosocial barriers to participate in program The patient should benefit from training in stress management and relaxation.      Screening Interventions    Interventions Encouraged to exercise    Expected Outcomes Short Term goal: Utilizing psychosocial counselor, staff and physician to assist with identification of specific Stressors or current issues interfering with healing process. Setting desired goal for each stressor or current issue identified.;Long Term Goal: Stressors or current issues are controlled or eliminated.;Short Term goal: Identification and review with participant of any Quality of Life or Depression concerns found by scoring the questionnaire.;Long Term goal: The participant improves quality of Life and PHQ9 Scores as seen by post scores and/or verbalization of changes             Quality of Life Scores:  Scores of 19 and below usually indicate a poorer quality of life in these areas.  A difference of  2-3 points is a clinically meaningful difference.  A difference of 2-3 points in the total score of the Quality of Life Index has been associated with significant improvement in overall quality of life, self-image, physical symptoms, and general health in studies assessing change in quality of life.  PHQ-9: Review Flowsheet       04/03/2024  Depression screen PHQ 2/9  Decreased Interest 2  Down, Depressed, Hopeless 0  PHQ - 2 Score 2  Altered sleeping 3  Tired, decreased energy 1  Change in appetite 0  Feeling bad or failure about yourself  0  Trouble concentrating 0  Moving slowly or fidgety/restless 0  Suicidal thoughts 0  PHQ-9 Score 6  Difficult doing work/chores Not difficult at all   Interpretation of Total Score  Total Score Depression Severity:  1-4 = Minimal depression, 5-9 = Mild depression, 10-14 = Moderate depression, 15-19 = Moderately severe depression, 20-27 = Severe depression   Psychosocial Evaluation and Intervention:  Psychosocial Evaluation - 04/03/24 1341       Psychosocial Evaluation & Interventions   Interventions Encouraged to exercise with the program and follow exercise  prescription;Stress management education    Expected Outcomes To participate in PR    Continue Psychosocial Services  Follow up required by staff             Psychosocial Re-Evaluation:   Psychosocial Discharge (Final Psychosocial Re-Evaluation):   Education: Education Goals: Education classes will be provided on a weekly basis, covering required topics. Participant will state understanding/return demonstration of topics presented.  Learning Barriers/Preferences:  Learning Barriers/Preferences - 04/03/24 1342       Learning Barriers/Preferences   Learning Barriers Sight;Reading    Learning Preferences Skilled Demonstration;Individual Instruction             Education Topics: Know Your Numbers Group instruction that is supported by a PowerPoint presentation. Instructor discusses importance of knowing and understanding resting, exercise, and post-exercise oxygen saturation, heart rate, and blood pressure. Oxygen saturation, heart rate, blood pressure, rating of perceived exertion, and dyspnea are reviewed along with a normal range for these values.    Exercise for the Pulmonary Patient Group instruction that is supported by a PowerPoint presentation. Instructor discusses benefits of exercise, core components of exercise, frequency, duration, and intensity of an exercise routine, importance of utilizing pulse oximetry during exercise, safety while exercising, and options of places to exercise outside of  rehab.    MET Level  Group instruction provided by PowerPoint, verbal discussion, and written material to support subject matter. Instructor reviews what METs are and how to increase METs.    Pulmonary Medications Verbally interactive group education provided by instructor with focus on inhaled medications and proper administration.   Anatomy and Physiology of the Respiratory System Group instruction provided by PowerPoint, verbal discussion, and written material to  support subject matter. Instructor reviews respiratory cycle and anatomical components of the respiratory system and their functions. Instructor also reviews differences in obstructive and restrictive respiratory diseases with examples of each.    Oxygen Safety Group instruction provided by PowerPoint, verbal discussion, and written material to support subject matter. There is an overview of "What is Oxygen" and "Why do we need it".  Instructor also reviews how to create a safe environment for oxygen use, the importance of using oxygen as prescribed, and the risks of noncompliance. There is a brief discussion on traveling with oxygen and resources the patient may utilize.   Oxygen Use Group instruction provided by PowerPoint, verbal discussion, and written material to discuss how supplemental oxygen is prescribed and different types of oxygen supply systems. Resources for more information are provided.    Breathing Techniques Group instruction that is supported by demonstration and informational handouts. Instructor discusses the benefits of pursed lip and diaphragmatic breathing and detailed demonstration on how to perform both.     Risk Factor Reduction Group instruction that is supported by a PowerPoint presentation. Instructor discusses the definition of a risk factor, different risk factors for pulmonary disease, and how the heart and lungs work together.   Pulmonary Diseases Group instruction provided by PowerPoint, verbal discussion, and written material to support subject matter. Instructor gives an overview of the different type of pulmonary diseases. There is also a discussion on risk factors and symptoms as well as ways to manage the diseases.   Stress and Energy Conservation Group instruction provided by PowerPoint, verbal discussion, and written material to support subject matter. Instructor gives an overview of stress and the impact it can have on the body. Instructor also  reviews ways to reduce stress. There is also a discussion on energy conservation and ways to conserve energy throughout the day.   Warning Signs and Symptoms Group instruction provided by PowerPoint, verbal discussion, and written material to support subject matter. Instructor reviews warning signs and symptoms of stroke, heart attack, cold and flu. Instructor also reviews ways to prevent the spread of infection.   Other Education Group or individual verbal, written, or video instructions that support the educational goals of the pulmonary rehab program.    Knowledge Questionnaire Score:  Knowledge Questionnaire Score - 04/03/24 1343       Knowledge Questionnaire Score   Pre Score 14/18             Core Components/Risk Factors/Patient Goals at Admission:  Personal Goals and Risk Factors at Admission - 04/03/24 1343       Core Components/Risk Factors/Patient Goals on Admission   Improve shortness of breath with ADL's Yes    Intervention Provide education, individualized exercise plan and daily activity instruction to help decrease symptoms of SOB with activities of daily living.    Expected Outcomes Short Term: Improve cardiorespiratory fitness to achieve a reduction of symptoms when performing ADLs;Long Term: Be able to perform more ADLs without symptoms or delay the onset of symptoms             Core  Components/Risk Factors/Patient Goals Review:    Core Components/Risk Factors/Patient Goals at Discharge (Final Review):    ITP Comments:   Comments: Dr. Genetta Kenning is Medical Director for Pulmonary Rehab at [facility].

## 2024-04-03 NOTE — Progress Notes (Signed)
 Pulmonary rehabilitation orientation physical assessment reveals: Patient is alert and oriented to person, place and time. Patient in good spirits. Lung sounds assessed by Cindra Cree RT reporting decreased breath sounds, no wheezes, rales or rhonchi. Good grip strength and equal. Palpable regular radial pulses. Extremities with no edema, she shared that the right ankle and toes will swell at times, today no swelling. Patient warm, dry to touch. She reports that he has a good appetite.

## 2024-04-07 ENCOUNTER — Ambulatory Visit (HOSPITAL_COMMUNITY)

## 2024-04-07 ENCOUNTER — Telehealth (HOSPITAL_COMMUNITY): Payer: Self-pay

## 2024-04-07 NOTE — Telephone Encounter (Signed)
 Patient called stating she is having issues with her transportation. She is supposed to give 3 days notice to needing a ride and missed that deadline so she will not be here for her 1st class on 6/12. Patient asked for us  to help her find new transportation, informed her that [facility] does not set up transportation for patients but I do have a print out listing resources that she can get when she comes in to class.  Patient's 1st PR class will be on 6/17, she is aware she needs to call and get her transportation set up by this Friday. The transportation resource printout will be at the front desk for her.

## 2024-04-08 NOTE — Progress Notes (Signed)
 Pulmonary Individual Treatment Plan  Patient Details  Name: Mariah Foster MRN: [id number] Date of Birth: 03/13/1961 Referring Provider:   [name] Pulmonary Rehab Walk Test from 04/03/2024 in [facility]  Referring Provider [name]       Initial Encounter Date:  Flowsheet Row Pulmonary Rehab Walk Test from 04/03/2024 in [facility]  Date 04/03/24       Visit Diagnosis: Centrilobular emphysema (HCC)  Patient's Home Medications on Admission:   Current Outpatient Medications:    albuterol  (PROVENTIL ) (2.5 MG/3ML) 0.083% nebulizer solution, Take 3 mLs (2.5 mg total) by nebulization 3 (three) times daily as needed for wheezing or shortness of breath., Disp: 75 mL, Rfl: 0   albuterol  (VENTOLIN  HFA) 108 (90 Base) MCG/ACT inhaler, Inhale 2 puffs into the lungs every 6 (six) hours as needed for wheezing or shortness of breath., Disp: 8 g, Rfl: 11   amLODipine  (NORVASC ) 5 MG tablet, Take 1 tablet (5 mg total) by mouth daily., Disp: 90 tablet, Rfl: 3   azelastine  (ASTELIN ) 0.1 % nasal spray, Place 2 sprays into both nostrils 2 (two) times daily. Use in each nostril as directed, Disp: , Rfl:    Blood Pressure Monitoring (BLOOD PRESSURE CUFF) MISC, Check blood pressure daily for elevated blood pressure, Disp: 1 each, Rfl: 0   budeson-glycopyrrolate-formoterol (BREZTRI  AEROSPHERE) 160-9-4.8 MCG/ACT AERO inhaler, Inhale 2 puffs into the lungs 2 (two) times daily., Disp: 10.7 g, Rfl: 11   cetirizine (ZYRTEC) 10 MG tablet, Take 10 mg by mouth daily., Disp: , Rfl:    dicyclomine  (BENTYL ) 20 MG tablet, Take 1 tablet (20 mg total) by mouth 2 (two) times daily., Disp: 20 tablet, Rfl: 0   FEROSUL 325 (65 Fe) MG tablet, Take 325 mg by mouth daily., Disp: , Rfl:    hydrOXYzine  (ATARAX ) 25 MG tablet, Take 25 mg by mouth every 6 (six) hours as needed (sleep)., Disp: , Rfl:    irbesartan   (AVAPRO ) 150 MG tablet, Take 1 tablet (150 mg total) by mouth daily., Disp: 30 tablet, Rfl: 3   levocetirizine (XYZAL ) 5 MG tablet, Take 1 tablet (5 mg total) by mouth every evening., Disp: 30 tablet, Rfl: 5   levothyroxine  (SYNTHROID ) 25 MCG tablet, Take 25 mcg by mouth daily before breakfast., Disp: , Rfl:    montelukast  (SINGULAIR ) 10 MG tablet, Take 1 tablet (10 mg total) by mouth at bedtime., Disp: 30 tablet, Rfl: 5   ondansetron  (ZOFRAN -ODT) 4 MG disintegrating tablet, Take 1 tablet (4 mg total) by mouth every 8 (eight) hours as needed for nausea or vomiting., Disp: 10 tablet, Rfl: 0   pantoprazole  (PROTONIX ) 40 MG tablet, Take 40 mg by mouth daily., Disp: , Rfl:    rosuvastatin  (CRESTOR ) 40 MG tablet, Take 1 tablet (40 mg total) by mouth daily., Disp: 90 tablet, Rfl: 3   spironolactone  (ALDACTONE ) 25 MG tablet, Take 1 tablet (25 mg total) by mouth daily., Disp: 90 tablet, Rfl: 3   tiZANidine (ZANAFLEX) 4 MG tablet, Take 4 mg by mouth every 8 (eight) hours as needed., Disp: , Rfl:    triamcinolone cream (KENALOG) 0.1 %, Apply 1 Application topically as needed., Disp: , Rfl:    doxycycline  (VIBRAMYCIN ) 100 MG capsule, Take 1 capsule (100 mg total) by mouth 2 (two) times daily. One po bid x 7 days (Patient not taking: Reported on 04/03/2024), Disp: 14 capsule, Rfl: 0   predniSONE  (DELTASONE )  20 MG tablet, 2 tabs po daily x 4 days (Patient not taking: Reported on 04/03/2024), Disp: 8 tablet, Rfl: 0   valACYclovir  (VALTREX ) 1000 MG tablet, Take 1,000 mg by mouth daily. For 5 days (Patient not taking: Reported on 04/03/2024), Disp: , Rfl:   Past Medical History: Past Medical History:  Diagnosis Date   CAD in native artery 12/11/2023   COPD (chronic obstructive pulmonary disease) (HCC)    GI bleed    Hyperlipidemia    Hypertension    Tubular adenoma of colon 2013    Tobacco Use: Social History   Tobacco Use  Smoking Status Former   Current packs/day: 0.00   Average packs/day: 1 pack/day for  40.0 years (40.0 ttl pk-yrs)   Types: Cigarettes   Start date: 51   Quit date: 2017   Years since quitting: 8.4  Smokeless Tobacco Never    Labs: Review Flowsheet       Latest Ref Rng & Units 02/26/2023 03/31/2023 04/01/2023  Labs for ITP Cardiac and Pulmonary Rehab  Cholestrol 100 - 199 mg/dL 960  - -  LDL (calc) 0 - 99 mg/dL 454  - -  HDL-C >09 mg/dL 60  - -  Trlycerides 0 - 149 mg/dL 811  - -  Bicarbonate 91.4 - 28.0 mmol/L - 19.8  22.5   TCO2 22 - 32 mmol/L - 21  24   Acid-base deficit 0.0 - 2.0 mmol/L - 3.0  3.0   O2 Saturation % - 77  69     Capillary Blood Glucose: Lab Results  Component Value Date   GLUCAP 101 (H) 03/14/2017   GLUCAP 117 (H) 01/17/2012     Pulmonary Assessment Scores:  Pulmonary Assessment Scores     Row Name 04/03/24 1346         ADL UCSD   ADL Phase Entry     SOB Score total 20       CAT Score   CAT Score 19       mMRC Score   mMRC Score 2             UCSD: Self-administered rating of dyspnea associated with activities of daily living (ADLs) 6-point scale (0 = not at all to 5 = maximal or unable to do because of breathlessness)  Scoring Scores range from 0 to 120.  Minimally important difference is 5 units  CAT: CAT can identify the health impairment of COPD patients and is better correlated with disease progression.  CAT has a scoring range of zero to 40. The CAT score is classified into four groups of low (less than 10), medium (10 - 20), high (21-30) and very high (31-40) based on the impact level of disease on health status. A CAT score over 10 suggests significant symptoms.  A worsening CAT score could be explained by an exacerbation, poor medication adherence, poor inhaler technique, or progression of COPD or comorbid conditions.  CAT MCID is 2 points  mMRC: mMRC (Modified Medical Research Council) Dyspnea Scale is used to assess the degree of baseline functional disability in patients of respiratory disease due to  dyspnea. No minimal important difference is established. A decrease in score of 1 point or greater is considered a positive change.   Pulmonary Function Assessment:  Pulmonary Function Assessment - 04/03/24 1359       Breath   Bilateral Breath Sounds Decreased    Shortness of Breath Yes;Limiting activity  Exercise Target Goals: Exercise Program Goal: Individual exercise prescription set using results from initial 6 min walk test and THRR while considering  patient's activity barriers and safety.   Exercise Prescription Goal: Initial exercise prescription builds to 30-45 minutes a day of aerobic activity, 2-3 days per week.  Home exercise guidelines will be given to patient during program as part of exercise prescription that the participant will acknowledge.  Activity Barriers & Risk Stratification:  Activity Barriers & Cardiac Risk Stratification - 04/03/24 1347       Activity Barriers & Cardiac Risk Stratification   Activity Barriers Deconditioning;Muscular Weakness;Shortness of Breath;Arthritis    Cardiac Risk Stratification High             6 Minute Walk:  6 Minute Walk     Row Name 04/03/24 1445         6 Minute Walk   Phase Initial     Distance 1200 feet     Walk Time 6 minutes     # of Rest Breaks 1  3:11-3:39 rest due to SOB and brief 87 RA     MPH 2.27     METS 3.64     RPE 13     Perceived Dyspnea  1     VO2 Peak 12.74     Symptoms Yes (comment)     Comments Short of breath     Resting HR 55 bpm     Resting BP 120/64     Resting Oxygen Saturation  98 %     Exercise Oxygen Saturation  during 6 min walk 89 %     Max Ex. HR 122 bpm     Max Ex. BP 178/82     2 Minute Post BP 162/78       Interval HR   1 Minute HR 64     2 Minute HR 62  inaccurate     3 Minute HR 121     4 Minute HR 68     5 Minute HR 70     6 Minute HR 122     2 Minute Post HR 73     Interval Heart Rate? Yes       Interval Oxygen   Interval Oxygen? Yes      Baseline Oxygen Saturation % 98 %     1 Minute Oxygen Saturation % 96 %     1 Minute Liters of Oxygen 0 L     2 Minute Oxygen Saturation % 93 %     2 Minute Liters of Oxygen 0 L     3 Minute Oxygen Saturation % 89 %     3 Minute Liters of Oxygen 0 L     4 Minute Oxygen Saturation % 96 %     4 Minute Liters of Oxygen 0 L     5 Minute Oxygen Saturation % 94 %     5 Minute Liters of Oxygen 0 L     6 Minute Oxygen Saturation % 99 %     6 Minute Liters of Oxygen 0 L     2 Minute Post Oxygen Saturation % 98 %     2 Minute Post Liters of Oxygen 0 L              Oxygen Initial Assessment:  Oxygen Initial Assessment - 04/03/24 1345       Home Oxygen   Home Oxygen Device None    Sleep Oxygen Prescription None  Home Exercise Oxygen Prescription None    Home Resting Oxygen Prescription None    Compliance with Home Oxygen Use Yes      Initial 6 min Walk   Oxygen Used None      Program Oxygen Prescription   Program Oxygen Prescription None      Intervention   Short Term Goals To learn and exhibit compliance with exercise, home and travel O2 prescription;To learn and understand importance of maintaining oxygen saturations>88%;To learn and demonstrate proper use of respiratory medications;To learn and understand importance of monitoring SPO2 with pulse oximeter and demonstrate accurate use of the pulse oximeter.;To learn and demonstrate proper pursed lip breathing techniques or other breathing techniques. ;Other    Long  Term Goals Exhibits compliance with exercise, home  and travel O2 prescription;Verbalizes importance of monitoring SPO2 with pulse oximeter and return demonstration;Maintenance of O2 saturations>88%;Exhibits proper breathing techniques, such as pursed lip breathing or other method taught during program session;Compliance with respiratory medication;Demonstrates proper use of MDI's             Oxygen Re-Evaluation:   Oxygen Discharge (Final Oxygen  Re-Evaluation):   Initial Exercise Prescription:  Initial Exercise Prescription - 04/03/24 1400       Date of Initial Exercise RX and Referring Provider   Date 04/03/24    Referring Provider [name]    Expected Discharge Date 06/30/24      NuStep   Level 2    SPM 70    Minutes 15    METs 2.3      Track   Laps 11    Minutes 15    METs 2.2      Prescription Details   Frequency (times per week) 2    Duration Progress to 30 minutes of continuous aerobic without signs/symptoms of physical distress      Intensity   THRR 40-80% of Max Heartrate 62-124    Ratings of Perceived Exertion 11-13    Perceived Dyspnea 0-4      Progression   Progression Continue to progress workloads to maintain intensity without signs/symptoms of physical distress.      Resistance Training   Training Prescription Yes    Weight blue bands    Reps 10-15             Perform Capillary Blood Glucose checks as needed.  Exercise Prescription Changes:   Exercise Comments:   Exercise Goals and Review:   Exercise Goals     Row Name 04/03/24 1348             Exercise Goals   Increase Physical Activity Yes       Intervention Provide advice, education, support and counseling about physical activity/exercise needs.;Develop an individualized exercise prescription for aerobic and resistive training based on initial evaluation findings, risk stratification, comorbidities and participant's personal goals.       Expected Outcomes Short Term: Attend rehab on a regular basis to increase amount of physical activity.;Long Term: Add in home exercise to make exercise part of routine and to increase amount of physical activity.;Long Term: Exercising regularly at least 3-5 days a week.       Increase Strength and Stamina Yes       Intervention Provide advice, education, support and counseling about physical activity/exercise needs.;Develop an individualized exercise prescription for aerobic and resistive  training based on initial evaluation findings, risk stratification, comorbidities and participant's personal goals.       Expected Outcomes Short Term: Increase workloads from initial  exercise prescription for resistance, speed, and METs.;Short Term: Perform resistance training exercises routinely during rehab and add in resistance training at home;Long Term: Improve cardiorespiratory fitness, muscular endurance and strength as measured by increased METs and functional capacity ( )       Able to understand and use rate of perceived exertion (RPE) scale Yes       Intervention Provide education and explanation on how to use RPE scale       Expected Outcomes Short Term: Able to use RPE daily in rehab to express subjective intensity level;Long Term:  Able to use RPE to guide intensity level when exercising independently       Able to understand and use Dyspnea scale Yes       Intervention Provide education and explanation on how to use Dyspnea scale       Expected Outcomes Short Term: Able to use Dyspnea scale daily in rehab to express subjective sense of shortness of breath during exertion;Long Term: Able to use Dyspnea scale to guide intensity level when exercising independently       Knowledge and understanding of Target Heart Rate Range (THRR) Yes       Intervention Provide education and explanation of THRR including how the numbers were predicted and where they are located for reference       Expected Outcomes Short Term: Able to state/look up THRR;Long Term: Able to use THRR to govern intensity when exercising independently;Short Term: Able to use daily as guideline for intensity in rehab       Understanding of Exercise Prescription Yes       Intervention Provide education, explanation, and written materials on patient's individual exercise prescription       Expected Outcomes Short Term: Able to explain program exercise prescription;Long Term: Able to explain home exercise prescription to exercise  independently                Exercise Goals Re-Evaluation :   Discharge Exercise Prescription (Final Exercise Prescription Changes):   Nutrition:  Target Goals: Understanding of nutrition guidelines, daily intake of sodium 1500mg , cholesterol 200mg , calories 30% from fat and 7% or less from saturated fats, daily to have 5 or more servings of fruits and vegetables.  Biometrics:  Pre Biometrics - 04/03/24 1454       Pre Biometrics   Grip Strength 38 kg   old dynometer             Nutrition Therapy Plan and Nutrition Goals:   Nutrition Assessments:  MEDIFICTS Score Key: >=70 Need to make dietary changes  40-70 Heart Healthy Diet <= 40 Therapeutic Level Cholesterol Diet   Picture Your Plate Scores: <16 Unhealthy dietary pattern with much room for improvement. 41-50 Dietary pattern unlikely to meet recommendations for good health and room for improvement. 51-60 More healthful dietary pattern, with some room for improvement.  >60 Healthy dietary pattern, although there may be some specific behaviors that could be improved.    Nutrition Goals Re-Evaluation:   Nutrition Goals Discharge (Final Nutrition Goals Re-Evaluation):   Psychosocial: Target Goals: Acknowledge presence or absence of significant depression and/or stress, maximize coping skills, provide positive support system. Participant is able to verbalize types and ability to use techniques and skills needed for reducing stress and depression.  Initial Review & Psychosocial Screening:  Initial Psych Review & Screening - 04/03/24 1318       Initial Review   Current issues with Current Sleep Concerns   can't fall asleep until 7  am, sleeps 2 hrs.     Family Dynamics   Good Support System? Yes    Comments son Sherolyn Dixon      Barriers   Psychosocial barriers to participate in program The patient should benefit from training in stress management and relaxation.      Screening Interventions    Interventions Encouraged to exercise    Expected Outcomes Short Term goal: Utilizing psychosocial counselor, staff and physician to assist with identification of specific Stressors or current issues interfering with healing process. Setting desired goal for each stressor or current issue identified.;Long Term Goal: Stressors or current issues are controlled or eliminated.;Short Term goal: Identification and review with participant of any Quality of Life or Depression concerns found by scoring the questionnaire.;Long Term goal: The participant improves quality of Life and PHQ9 Scores as seen by post scores and/or verbalization of changes             Quality of Life Scores:  Scores of 19 and below usually indicate a poorer quality of life in these areas.  A difference of  2-3 points is a clinically meaningful difference.  A difference of 2-3 points in the total score of the Quality of Life Index has been associated with significant improvement in overall quality of life, self-image, physical symptoms, and general health in studies assessing change in quality of life.  PHQ-9: Review Flowsheet       04/03/2024  Depression screen PHQ 2/9  Decreased Interest 2  Down, Depressed, Hopeless 0  PHQ - 2 Score 2  Altered sleeping 3  Tired, decreased energy 1  Change in appetite 0  Feeling bad or failure about yourself  0  Trouble concentrating 0  Moving slowly or fidgety/restless 0  Suicidal thoughts 0  PHQ-9 Score 6  Difficult doing work/chores Not difficult at all   Interpretation of Total Score  Total Score Depression Severity:  1-4 = Minimal depression, 5-9 = Mild depression, 10-14 = Moderate depression, 15-19 = Moderately severe depression, 20-27 = Severe depression   Psychosocial Evaluation and Intervention:  Psychosocial Evaluation - 04/03/24 1341       Psychosocial Evaluation & Interventions   Interventions Encouraged to exercise with the program and follow exercise  prescription;Stress management education    Expected Outcomes To participate in PR    Continue Psychosocial Services  Follow up required by staff             Psychosocial Re-Evaluation:  Psychosocial Re-Evaluation     Row Name 04/06/24 0908             Psychosocial Re-Evaluation   Current issues with Current Sleep Concerns       Comments No changes since orientation. Mariah Foster is scheduled to start the program on 6/12       Expected Outcomes For Mariah Foster to get quality sleep, to feel well rested. To exercise in Pulm Rehab without any other barriers or concerns       Interventions Encouraged to attend Pulmonary Rehabilitation for the exercise       Continue Psychosocial Services  Follow up required by staff                Psychosocial Discharge (Final Psychosocial Re-Evaluation):  Psychosocial Re-Evaluation - 04/06/24 0908       Psychosocial Re-Evaluation   Current issues with Current Sleep Concerns    Comments No changes since orientation. Mariah Foster is scheduled to start the program on 6/12    Expected Outcomes For Mariah Foster to get  quality sleep, to feel well rested. To exercise in Pulm Rehab without any other barriers or concerns    Interventions Encouraged to attend Pulmonary Rehabilitation for the exercise    Continue Psychosocial Services  Follow up required by staff             Education: Education Goals: Education classes will be provided on a weekly basis, covering required topics. Participant will state understanding/return demonstration of topics presented.  Learning Barriers/Preferences:  Learning Barriers/Preferences - 04/03/24 1342       Learning Barriers/Preferences   Learning Barriers Sight;Reading    Learning Preferences Skilled Demonstration;Individual Instruction             Education Topics: Know Your Numbers Group instruction that is supported by a PowerPoint presentation. Instructor discusses importance of knowing and understanding resting,  exercise, and post-exercise oxygen saturation, heart rate, and blood pressure. Oxygen saturation, heart rate, blood pressure, rating of perceived exertion, and dyspnea are reviewed along with a normal range for these values.    Exercise for the Pulmonary Patient Group instruction that is supported by a PowerPoint presentation. Instructor discusses benefits of exercise, core components of exercise, frequency, duration, and intensity of an exercise routine, importance of utilizing pulse oximetry during exercise, safety while exercising, and options of places to exercise outside of rehab.    MET Level  Group instruction provided by PowerPoint, verbal discussion, and written material to support subject matter. Instructor reviews what METs are and how to increase METs.    Pulmonary Medications Verbally interactive group education provided by instructor with focus on inhaled medications and proper administration.   Anatomy and Physiology of the Respiratory System Group instruction provided by PowerPoint, verbal discussion, and written material to support subject matter. Instructor reviews respiratory cycle and anatomical components of the respiratory system and their functions. Instructor also reviews differences in obstructive and restrictive respiratory diseases with examples of each.    Oxygen Safety Group instruction provided by PowerPoint, verbal discussion, and written material to support subject matter. There is an overview of "What is Oxygen" and "Why do we need it".  Instructor also reviews how to create a safe environment for oxygen use, the importance of using oxygen as prescribed, and the risks of noncompliance. There is a brief discussion on traveling with oxygen and resources the patient may utilize.   Oxygen Use Group instruction provided by PowerPoint, verbal discussion, and written material to discuss how supplemental oxygen is prescribed and different types of oxygen supply  systems. Resources for more information are provided.    Breathing Techniques Group instruction that is supported by demonstration and informational handouts. Instructor discusses the benefits of pursed lip and diaphragmatic breathing and detailed demonstration on how to perform both.     Risk Factor Reduction Group instruction that is supported by a PowerPoint presentation. Instructor discusses the definition of a risk factor, different risk factors for pulmonary disease, and how the heart and lungs work together.   Pulmonary Diseases Group instruction provided by PowerPoint, verbal discussion, and written material to support subject matter. Instructor gives an overview of the different type of pulmonary diseases. There is also a discussion on risk factors and symptoms as well as ways to manage the diseases.   Stress and Energy Conservation Group instruction provided by PowerPoint, verbal discussion, and written material to support subject matter. Instructor gives an overview of stress and the impact it can have on the body. Instructor also reviews ways to reduce stress. There is also a discussion  on energy conservation and ways to conserve energy throughout the day.   Warning Signs and Symptoms Group instruction provided by PowerPoint, verbal discussion, and written material to support subject matter. Instructor reviews warning signs and symptoms of stroke, heart attack, cold and flu. Instructor also reviews ways to prevent the spread of infection.   Other Education Group or individual verbal, written, or video instructions that support the educational goals of the pulmonary rehab program.    Knowledge Questionnaire Score:  Knowledge Questionnaire Score - 04/03/24 1343       Knowledge Questionnaire Score   Pre Score 14/18             Core Components/Risk Factors/Patient Goals at Admission:  Personal Goals and Risk Factors at Admission - 04/03/24 1343       Core  Components/Risk Factors/Patient Goals on Admission   Improve shortness of breath with ADL's Yes    Intervention Provide education, individualized exercise plan and daily activity instruction to help decrease symptoms of SOB with activities of daily living.    Expected Outcomes Short Term: Improve cardiorespiratory fitness to achieve a reduction of symptoms when performing ADLs;Long Term: Be able to perform more ADLs without symptoms or delay the onset of symptoms             Core Components/Risk Factors/Patient Goals Review:   Goals and Risk Factor Review     Row Name 04/06/24 0910             Core Components/Risk Factors/Patient Goals Review   Personal Goals Review Improve shortness of breath with ADL's;Develop more efficient breathing techniques such as purse lipped breathing and diaphragmatic breathing and practicing self-pacing with activity.       Review Unable to assess goals yet. Aarianna has not started the program. She is scheduled to start on 6/12       Expected Outcomes For Chapel to improve shortness of breath with ADL's and develop more efficient breathing techniques such as purse lipped breathing and diaphragmatic breathing; and practicing self-pacing with activity and improve shortness of breath with ADL's                Core Components/Risk Factors/Patient Goals at Discharge (Final Review):   Goals and Risk Factor Review - 04/06/24 0910       Core Components/Risk Factors/Patient Goals Review   Personal Goals Review Improve shortness of breath with ADL's;Develop more efficient breathing techniques such as purse lipped breathing and diaphragmatic breathing and practicing self-pacing with activity.    Review Unable to assess goals yet. Nolie has not started the program. She is scheduled to start on 6/12    Expected Outcomes For Mariah Foster to improve shortness of breath with ADL's and develop more efficient breathing techniques such as purse lipped breathing and diaphragmatic  breathing; and practicing self-pacing with activity and improve shortness of breath with ADL's             ITP Comments: Pt is making expected progress toward Pulmonary Rehab goals after completing 0 session(s). Recommend continued exercise, life style modification, education, and utilization of breathing techniques to increase stamina and strength, while also decreasing shortness of breath with exertion.  Dr. Genetta Kenning is Medical Director for Pulmonary Rehab at [facility].

## 2024-04-09 ENCOUNTER — Encounter (HOSPITAL_COMMUNITY)
Admission: RE | Admit: 2024-04-09 | Discharge: 2024-04-09 | Disposition: A | Discharge status: Home / Self Care | Source: Ambulatory Visit | Attending: Pulmonary Disease | Admitting: Pulmonary Disease

## 2024-04-09 ENCOUNTER — Ambulatory Visit (HOSPITAL_COMMUNITY)

## 2024-04-09 ENCOUNTER — Encounter (HOSPITAL_COMMUNITY)

## 2024-04-09 ENCOUNTER — Telehealth (HOSPITAL_COMMUNITY): Payer: Self-pay

## 2024-04-09 DIAGNOSIS — J432 Centrilobular emphysema: Secondary | ICD-10-CM

## 2024-04-09 NOTE — Progress Notes (Signed)
 Daily Session Note  Patient Details  Name: Mariah Foster MRN: [id number] Date of Birth: 1960-12-02 Referring Provider:   [name] Pulmonary Rehab Walk Test from 04/03/2024 in [facility] for Heart, Vascular, & Lung Health  Referring Provider [name]    Encounter Date: 04/09/2024  Check In:  Session Check In - 04/09/24 1325       Check-In   Supervising physician immediately available to respond to emergencies [facility] MD immediately available    Physician(s) Slater Duncan, NP    Location [facility]    Staff Present Sueellen Emery BS, ACSM-CEP, Exercise Physiologist;Carlette Alaine Alken, RN, Shasta Deist, MS, ACSM-CEP, Exercise Physiologist;Mary Arlester Ladd, RN, Angie Kenner, RT    Virtual Visit No    Medication changes reported     No    Fall or balance concerns reported    No    Tobacco Cessation No Change    Warm-up and Cool-down Not performed (comment)   orientation only   Resistance Training Performed No    VAD Patient? No    PAD/SET Patient? No      Pain Assessment   Currently in Pain? No/denies    Multiple Pain Sites No          Capillary Blood Glucose: No results found for this or any previous visit (from the past 24 hours).    Social History   Tobacco Use  Smoking Status Former   Current packs/day: 0.00   Average packs/day: 1 pack/day for 40.0 years (40.0 ttl pk-yrs)   Types: Cigarettes   Start date: 29   Quit date: 2017   Years since quitting: 8.4  Smokeless Tobacco Never    Goals Met:  Proper associated with RPD/PD & O2 Sat Independence with exercise equipment Exercise tolerated well No report of concerns or symptoms today Strength training completed today  Goals Unmet:  Not Applicable  Comments: Service time is from 1303 to 1440.    Dr. Genetta Kenning is Medical Director for Pulmonary Rehab at [facility].

## 2024-04-09 NOTE — Telephone Encounter (Signed)
 Called Mariah Foster on 6/11, informed her RN got rides set up through [facility] Medicare through the end of June. Pt aware that she is scheduled to come to PR class

## 2024-04-13 ENCOUNTER — Other Ambulatory Visit: Payer: Self-pay

## 2024-04-13 DIAGNOSIS — Z122 Encounter for screening for malignant neoplasm of respiratory organs: Secondary | ICD-10-CM

## 2024-04-13 DIAGNOSIS — Z87891 Personal history of nicotine dependence: Secondary | ICD-10-CM

## 2024-04-14 ENCOUNTER — Encounter (HOSPITAL_COMMUNITY): Admission: RE | Admit: 2024-04-14 | Source: Ambulatory Visit

## 2024-04-14 ENCOUNTER — Telehealth (HOSPITAL_COMMUNITY): Payer: Self-pay

## 2024-04-14 ENCOUNTER — Ambulatory Visit (HOSPITAL_COMMUNITY)

## 2024-04-14 NOTE — Telephone Encounter (Signed)
 Called pt to check on her since she missed PR class today. She stated she had a doctors appt  and will be back on 04/16/24.

## 2024-04-16 ENCOUNTER — Ambulatory Visit (HOSPITAL_COMMUNITY)

## 2024-04-16 ENCOUNTER — Encounter (HOSPITAL_COMMUNITY)
Admission: RE | Admit: 2024-04-16 | Discharge: 2024-04-16 | Disposition: A | Discharge status: Home / Self Care | Source: Ambulatory Visit | Attending: Pulmonary Disease | Admitting: Pulmonary Disease

## 2024-04-16 DIAGNOSIS — J432 Centrilobular emphysema: Secondary | ICD-10-CM

## 2024-04-16 NOTE — Progress Notes (Signed)
 Daily Session Note  Patient Details  Name: Mariah Foster MRN: [id number] Date of Birth: 1961/10/07 Referring Provider:   [name] Pulmonary Rehab Walk Test from 04/03/2024 in [facility] for Heart, Vascular, & Lung Health  Referring Provider [name]    Encounter Date: 04/16/2024  Check In:  Session Check In - 04/16/24 1312       Check-In   Supervising physician immediately available to respond to emergencies [facility] immediately available    Physician(s) Lawana Pray, NP    [facility]    Staff Present Sueellen Emery BS, ACSM-CEP, Exercise Physiologist;Brandin Stetzer Nolon Baxter, MS, ACSM-CEP, Exercise Physiologist;Mary Arlester Ladd, RN, Angie Kenner, RT    Virtual Visit No    Medication changes reported     No    Fall or balance concerns reported    No    Tobacco Cessation No Change    Warm-up and Cool-down Not performed (comment)   orientation only   Resistance Training Performed No    VAD Patient? No    PAD/SET Patient? No      Pain Assessment   Currently in Pain? No/denies    Multiple Pain Sites No          Capillary Blood Glucose: No results found for this or any previous visit (from the past 24 hours).    Social History   Tobacco Use  Smoking Status Former   Current packs/day: 0.00   Average packs/day: 1 pack/day for 40.0 years (40.0 ttl pk-yrs)   Types: Cigarettes   Start date: 60   Quit date: 2017   Years since quitting: 8.4  Smokeless Tobacco Never    Goals Met:  Proper associated with RPD/PD & O2 Sat Exercise tolerated well No report of concerns or symptoms today Strength training completed today  Goals Unmet:  Not Applicable  Comments: Service time is from 1302 to 1445.    Dr. Genetta Kenning is Medical Director for Pulmonary Rehab at [facility]

## 2024-04-21 ENCOUNTER — Encounter (HOSPITAL_COMMUNITY)
Admission: RE | Admit: 2024-04-21 | Discharge: 2024-04-21 | Disposition: A | Discharge status: Home / Self Care | Source: Ambulatory Visit | Attending: Pulmonary Disease | Admitting: Pulmonary Disease

## 2024-04-21 ENCOUNTER — Ambulatory Visit (HOSPITAL_COMMUNITY)

## 2024-04-21 VITALS — Wt 149.3 lb

## 2024-04-21 DIAGNOSIS — J432 Centrilobular emphysema: Secondary | ICD-10-CM

## 2024-04-21 NOTE — Progress Notes (Signed)
 Daily Session Note  Patient Details  Name: Mariah Foster MRN: [id number] Date of Birth: [birth year] Referring Provider:   Conrad Ports Pulmonary Rehab Walk Test from 04/03/2024 in [facility] for Heart, Vascular, & Lung Health  Referring Provider [name]    Encounter Date: 04/21/2024  Check In:  Session Check In - 04/21/24 1314       Check-In   Supervising physician immediately available to respond to emergencies [facility] immediately available    Physician(s) Josefa Beauvais, NP    [facility]    Staff Present Cloyd Aris BS, ACSM-CEP, Exercise Physiologist;Kaylee Nicholaus, MS, ACSM-CEP, Exercise Physiologist;Mary Harvy, RN, Wetzel Sharps, RT    Virtual Visit No    Medication changes reported     No    Fall or balance concerns reported    No    Tobacco Cessation No Change    Warm-up and Cool-down Performed as group-led instruction   orientation only   Resistance Training Performed Yes    VAD Patient? No    PAD/SET Patient? No      Pain Assessment   Currently in Pain? No/denies    Multiple Pain Sites No          Capillary Blood Glucose: No results found for this or any previous visit (from the past 24 hours).   Exercise Prescription Changes - 04/21/24 1500       Response to Exercise   Blood Pressure (Admit) 142/70    Blood Pressure (Exercise) 142/76    Blood Pressure (Exit) 142/74    Heart Rate (Admit) 63 bpm    Heart Rate (Exercise) 77 bpm    Heart Rate (Exit) 79 bpm    Oxygen Saturation (Admit) 97 %    Oxygen Saturation (Exercise) 97 %    Oxygen Saturation (Exit) 99 %    Rating of Perceived Exertion (Exercise) 11    Perceived Dyspnea (Exercise) 1    Duration Continue with 30 min of aerobic exercise without signs/symptoms of physical distress.    Intensity THRR unchanged      Progression   Progression Continue to progress workloads to maintain intensity without signs/symptoms of physical distress.       Resistance Training   Training Prescription Yes    Weight blue bands    Reps 10-15    Time 10 Minutes      NuStep   Level 2    SPM 73    Minutes 15    METs 2.2      Track   Laps 18    Minutes 15    METs 3.25          Social History   Tobacco Use  Smoking Status Former   Current packs/day: 0.00   Average packs/day: 1 pack/day for 40.0 years (40.0 ttl pk-yrs)   Types: Cigarettes   Start date: 73   Quit date: 2017   Years since quitting: 8.4  Smokeless Tobacco Never    Goals Met:  Proper associated with RPD/PD & O2 Sat Independence with exercise equipment Exercise tolerated well No report of concerns or symptoms today Strength training completed today  Goals Unmet:  Not Applicable  Comments: Service time is from 1308 to 1443.    Dr. Slater Staff is Medical Director for Pulmonary Rehab at [facility].

## 2024-04-23 ENCOUNTER — Encounter (HOSPITAL_COMMUNITY)
Admission: RE | Admit: 2024-04-23 | Discharge: 2024-04-23 | Disposition: A | Discharge status: Home / Self Care | Source: Ambulatory Visit | Attending: Pulmonary Disease | Admitting: Pulmonary Disease

## 2024-04-23 ENCOUNTER — Ambulatory Visit (HOSPITAL_COMMUNITY)

## 2024-04-23 DIAGNOSIS — J432 Centrilobular emphysema: Secondary | ICD-10-CM

## 2024-04-23 NOTE — Progress Notes (Signed)
 Daily Session Note  Patient Details  Name: Mariah Foster MRN: [id number] Date of Birth: [birth year] Referring Provider:   Conrad Ports Pulmonary Rehab Walk Test from 04/03/2024 in [facility] for Heart, Vascular, & Lung Health  Referring Provider [name]    Encounter Date: 04/23/2024  Check In:  Session Check In - 04/23/24 1318       Check-In   Physician(s) Carolyn Mose, NP    [facility]    Staff Present Cloyd Aris BS, ACSM-CEP, Exercise Physiologist;Kaylee Nicholaus, MS, ACSM-CEP, Exercise Physiologist;Mary Harvy, RN, Wetzel Sharps, RT    Virtual Visit No    Medication changes reported     No    Fall or balance concerns reported    No    Tobacco Cessation No Change    Warm-up and Cool-down Performed as group-led instruction   orientation only   Resistance Training Performed Yes    VAD Patient? No    PAD/SET Patient? No      Pain Assessment   Currently in Pain? No/denies    Multiple Pain Sites No          Capillary Blood Glucose: No results found for this or any previous visit (from the past 24 hours).    Social History   Tobacco Use  Smoking Status Former   Current packs/day: 0.00   Average packs/day: 1 pack/day for 40.0 years (40.0 ttl pk-yrs)   Types: Cigarettes   Start date: 55   Quit date: 2017   Years since quitting: 8.4  Smokeless Tobacco Never    Goals Met:  Proper associated with RPD/PD & O2 Sat Independence with exercise equipment Exercise tolerated well No report of concerns or symptoms today Strength training completed today  Goals Unmet:  Not Applicable  Comments: Service time is from 1307 to 1444.    Dr. Slater Staff is Medical Director for Pulmonary Rehab at [facility].

## 2024-04-28 ENCOUNTER — Ambulatory Visit (HOSPITAL_COMMUNITY)

## 2024-04-28 ENCOUNTER — Encounter (HOSPITAL_COMMUNITY)
Admission: RE | Admit: 2024-04-28 | Discharge: 2024-04-28 | Disposition: A | Discharge status: Home / Self Care | Source: Ambulatory Visit | Attending: Pulmonary Disease | Admitting: Pulmonary Disease

## 2024-04-28 DIAGNOSIS — J432 Centrilobular emphysema: Secondary | ICD-10-CM | POA: Insufficient documentation

## 2024-04-28 NOTE — Progress Notes (Signed)
 Daily Session Note  Patient Details  Name: Mariah Foster MRN: [id number] Date of Birth: 1961-06-19 Referring Provider:   Conrad Ports Pulmonary Rehab Walk Test from 04/03/2024 in [facility] for Heart, Vascular, & Lung Health  Referring Provider [name]    Encounter Date: 04/28/2024  Check In:  Session Check In - 04/28/24 1327       Check-In   Supervising physician immediately available to respond to emergencies [facility] immediately available    Physician(s) Jackee Alberts, NP    [facility]    Staff Present Cloyd Aris BS, ACSM-CEP, Exercise Physiologist;Kaylee Nicholaus, MS, ACSM-CEP, Exercise Physiologist;Jerni Selmer Harvy, RN, Wetzel Sharps, RT    Virtual Visit No    Medication changes reported     No    Fall or balance concerns reported    No    Tobacco Cessation No Change    Warm-up and Cool-down Performed as group-led instruction   orientation only   Resistance Training Performed Yes    VAD Patient? No    PAD/SET Patient? No      Pain Assessment   Currently in Pain? No/denies    Multiple Pain Sites No          Capillary Blood Glucose: No results found for this or any previous visit (from the past 24 hours).    Social History   Tobacco Use  Smoking Status Former   Current packs/day: 0.00   Average packs/day: 1 pack/day for 40.0 years (40.0 ttl pk-yrs)   Types: Cigarettes   Start date: 89   Quit date: 2017   Years since quitting: 8.5  Smokeless Tobacco Never    Goals Met:  Exercise tolerated well No report of concerns or symptoms today Strength training completed today  Goals Unmet:  Not Applicable  Comments: Service time is from 1314 to 1440    Dr. Slater Staff is Medical Director for Pulmonary Rehab at [facility]

## 2024-04-30 ENCOUNTER — Ambulatory Visit (HOSPITAL_COMMUNITY)

## 2024-04-30 ENCOUNTER — Encounter (HOSPITAL_COMMUNITY)
Admission: RE | Admit: 2024-04-30 | Discharge: 2024-04-30 | Disposition: A | Discharge status: Home / Self Care | Source: Ambulatory Visit | Attending: Pulmonary Disease | Admitting: Pulmonary Disease

## 2024-04-30 DIAGNOSIS — J432 Centrilobular emphysema: Secondary | ICD-10-CM | POA: Diagnosis not present

## 2024-04-30 NOTE — Progress Notes (Signed)
 Daily Session Note  Patient Details  Name: Mariah Foster MRN: [id number] Date of Birth: [birth year] Referring Provider:   Conrad Ports Pulmonary Rehab Walk Test from 04/03/2024 in [facility] for Heart, Vascular, & Lung Health  Referring Provider [name]    Encounter Date: 04/30/2024  Check In:  Session Check In - 04/30/24 1334       Check-In   Supervising physician immediately available to respond to emergencies [facility] immediately available    Physician(s) Lum Louis, NP    [facility]    Staff Present Cloyd Aris BS, ACSM-CEP, Exercise Physiologist;Kaylee Nicholaus, MS, ACSM-CEP, Exercise Physiologist;Bryauna Byrum Harvy, RN, BSN;Other;Johnny Fayette, MS, Exercise Physiologist    Virtual Visit No    Medication changes reported     No    Fall or balance concerns reported    No    Tobacco Cessation No Change    Warm-up and Cool-down Performed as group-led instruction   orientation only   Resistance Training Performed Yes    VAD Patient? No    PAD/SET Patient? No      Pain Assessment   Currently in Pain? No/denies    Multiple Pain Sites No          Capillary Blood Glucose: No results found for this or any previous visit (from the past 24 hours).    Social History   Tobacco Use  Smoking Status Former   Current packs/day: 0.00   Average packs/day: 1 pack/day for 40.0 years (40.0 ttl pk-yrs)   Types: Cigarettes   Start date: 69   Quit date: 2017   Years since quitting: 8.5  Smokeless Tobacco Never    Goals Met:  Exercise tolerated well No report of concerns or symptoms today Strength training completed today  Goals Unmet:  Not Applicable  Comments: Service time is from 1320 to 1443    Dr. Slater Staff is Medical Director for Pulmonary Rehab at [facility].

## 2024-05-04 ENCOUNTER — Ambulatory Visit (HOSPITAL_COMMUNITY)
Admission: RE | Admit: 2024-05-04 | Discharge: 2024-05-04 | Disposition: A | Discharge status: Home / Self Care | Source: Ambulatory Visit | Attending: Pulmonary Disease | Admitting: Pulmonary Disease

## 2024-05-04 ENCOUNTER — Telehealth (HOSPITAL_COMMUNITY): Payer: Self-pay

## 2024-05-04 DIAGNOSIS — J432 Centrilobular emphysema: Secondary | ICD-10-CM | POA: Diagnosis present

## 2024-05-04 LAB — PULMONARY FUNCTION TEST
DL/VA % pred: 78 %
DL/VA: 3.29 ml/min/mmHg/L
DLCO unc % pred: 47 %
DLCO unc: 9.34 ml/min/mmHg
FEF 25-75 Post: 0.61 L/s
FEF 25-75 Pre: 0.33 L/s
FEF2575-%Change-Post: 84 %
FEF2575-%Pred-Post: 28 %
FEF2575-%Pred-Pre: 15 %
FEV1-%Change-Post: 11 %
FEV1-%Pred-Post: 41 %
FEV1-%Pred-Pre: 36 %
FEV1-Post: 1 L
FEV1-Pre: 0.89 L
FEV1FVC-%Change-Post: -9 %
FEV1FVC-%Pred-Pre: 79 %
FEV6-%Change-Post: 22 %
FEV6-%Pred-Post: 57 %
FEV6-%Pred-Pre: 46 %
FEV6-Post: 1.74 L
FEV6-Pre: 1.42 L
FEV6FVC-%Change-Post: 0 %
FEV6FVC-%Pred-Post: 100 %
FEV6FVC-%Pred-Pre: 101 %
FVC-%Change-Post: 23 %
FVC-%Pred-Post: 57 %
FVC-%Pred-Pre: 46 %
FVC-Post: 1.8 L
FVC-Pre: 1.46 L
Post FEV1/FVC ratio: 55 %
Post FEV6/FVC ratio: 97 %
Pre FEV1/FVC ratio: 61 %
Pre FEV6/FVC Ratio: 97 %
RV % pred: 168 %
RV: 3.51 L
TLC % pred: 101 %
TLC: 5.15 L

## 2024-05-04 MED ORDER — ALBUTEROL SULFATE (2.5 MG/3ML) 0.083% IN NEBU
2.5000 mg | INHALATION_SOLUTION | Freq: Once | RESPIRATORY_TRACT | Status: AC
  Administered 2024-05-04: 2.5 mg via RESPIRATORY_TRACT

## 2024-05-04 NOTE — Telephone Encounter (Signed)
 LVM for Mariah Foster to see if she had transportation set up for [employer]. Provided phone number for transportation [phone]). RN left transportation a message on 7/3 trying to set it up for her.

## 2024-05-05 ENCOUNTER — Encounter (HOSPITAL_BASED_OUTPATIENT_CLINIC_OR_DEPARTMENT_OTHER)

## 2024-05-05 ENCOUNTER — Encounter (HOSPITAL_COMMUNITY)
Admission: RE | Admit: 2024-05-05 | Discharge: 2024-05-05 | Disposition: A | Discharge status: Home / Self Care | Source: Ambulatory Visit | Attending: Pulmonary Disease | Admitting: Pulmonary Disease

## 2024-05-05 ENCOUNTER — Ambulatory Visit: Admitting: Pulmonary Disease

## 2024-05-05 VITALS — Wt 148.4 lb

## 2024-05-05 DIAGNOSIS — J432 Centrilobular emphysema: Secondary | ICD-10-CM | POA: Diagnosis not present

## 2024-05-05 NOTE — Progress Notes (Signed)
 Daily Session Note  Patient Details  Name: Mariah Foster MRN: [id number] Date of Birth: [birth year] Referring Provider:   Conrad Ports Pulmonary Rehab Walk Test from 04/03/2024 in [facility] for Heart, Vascular, & Lung Health  Referring Provider [name]    Encounter Date: 05/05/2024  Check In:  Session Check In - 05/05/24 1458       Check-In   Supervising physician immediately available to respond to emergencies [facility] immediately available    Physician(s) Damien Braver, NP    [facility]    Staff Present Cloyd Aris BS, ACSM-CEP, Exercise Physiologist;Kaylee Nicholaus, MS, ACSM-CEP, Exercise Physiologist;Mary Harvy, RN, BSN;Samantha Belarus, RD, Maximo Sharps, RT    Virtual Visit No    Medication changes reported     No    Fall or balance concerns reported    No    Tobacco Cessation No Change    Warm-up and Cool-down Performed as group-led instruction   orientation only   Resistance Training Performed Yes    VAD Patient? No    PAD/SET Patient? No      Pain Assessment   Currently in Pain? No/denies    Multiple Pain Sites No          Capillary Blood Glucose: No results found for this or any previous visit (from the past 24 hours).   Exercise Prescription Changes - 05/05/24 1400       Response to Exercise   Blood Pressure (Admit) 128/72    Blood Pressure (Exercise) 140/80    Blood Pressure (Exit) 122/72    Heart Rate (Admit) 71 bpm    Heart Rate (Exercise) 85 bpm    Heart Rate (Exit) 84 bpm    Oxygen Saturation (Admit) 98 %    Oxygen Saturation (Exercise) 96 %    Oxygen Saturation (Exit) 97 %    Rating of Perceived Exertion (Exercise) 13    Perceived Dyspnea (Exercise) 3    Duration Continue with 30 min of aerobic exercise without signs/symptoms of physical distress.    Intensity THRR unchanged      Progression   Progression Continue to progress workloads to maintain intensity without signs/symptoms of physical  distress.      Resistance Training   Training Prescription Yes    Weight blue bands    Reps 10-15    Time 10 Minutes      NuStep   Level 3    SPM 76    Minutes 15    METs 2.6      Track   Laps 21    Minutes 15    METs 3.62          Social History   Tobacco Use  Smoking Status Former   Current packs/day: 0.00   Average packs/day: 1 pack/day for 40.0 years (40.0 ttl pk-yrs)   Types: Cigarettes   Start date: 89   Quit date: 2017   Years since quitting: 8.5  Smokeless Tobacco Never    Goals Met:  Proper associated with RPD/PD & O2 Sat Independence with exercise equipment Exercise tolerated well No report of concerns or symptoms today Strength training completed today  Goals Unmet:  Not Applicable  Comments: Service time is from 1313 to 1438.    Dr. Slater Staff is Medical Director for Pulmonary Rehab at [facility].

## 2024-05-06 NOTE — Progress Notes (Signed)
 Pulmonary Individual Treatment Plan  Patient Details  Name: Mariah Foster MRN: [id number] Date of Birth: 1961-04-30 Referring Provider:   Conrad Ports Pulmonary Rehab Walk Test from 04/03/2024 in [facility]  Referring Provider [name]    Initial Encounter Date:  Flowsheet Row Pulmonary Rehab Walk Test from 04/03/2024 in [facility]  Date 04/03/24    Visit Diagnosis: Centrilobular emphysema (HCC)  Patient's Home Medications on Admission:   Current Outpatient Medications:    albuterol  (PROVENTIL ) (2.5 MG/3ML) 0.083% nebulizer solution, Take 3 mLs (2.5 mg total) by nebulization 3 (three) times daily as needed for wheezing or shortness of breath., Disp: 75 mL, Rfl: 0   albuterol  (VENTOLIN  HFA) 108 (90 Base) MCG/ACT inhaler, Inhale 2 puffs into the lungs every 6 (six) hours as needed for wheezing or shortness of breath., Disp: 8 g, Rfl: 11   amLODipine  (NORVASC ) 5 MG tablet, Take 1 tablet (5 mg total) by mouth daily., Disp: 90 tablet, Rfl: 3   azelastine  (ASTELIN ) 0.1 % nasal spray, Place 2 sprays into both nostrils 2 (two) times daily. Use in each nostril as directed, Disp: , Rfl:    Blood Pressure Monitoring (BLOOD PRESSURE CUFF) MISC, Check blood pressure daily for elevated blood pressure, Disp: 1 each, Rfl: 0   budeson-glycopyrrolate-formoterol (BREZTRI  AEROSPHERE) 160-9-4.8 MCG/ACT AERO inhaler, Inhale 2 puffs into the lungs 2 (two) times daily., Disp: 10.7 g, Rfl: 11   cetirizine (ZYRTEC) 10 MG tablet, Take 10 mg by mouth daily., Disp: , Rfl:    dicyclomine  (BENTYL ) 20 MG tablet, Take 1 tablet (20 mg total) by mouth 2 (two) times daily., Disp: 20 tablet, Rfl: 0   doxycycline  (VIBRAMYCIN ) 100 MG capsule, Take 1 capsule (100 mg total) by mouth 2 (two) times daily. One po bid x 7 days (Patient not taking: Reported on 04/03/2024), Disp: 14 capsule, Rfl: 0   FEROSUL 325 (65 Fe)  MG tablet, Take 325 mg by mouth daily., Disp: , Rfl:    hydrOXYzine  (ATARAX ) 25 MG tablet, Take 25 mg by mouth every 6 (six) hours as needed (sleep)., Disp: , Rfl:    irbesartan  (AVAPRO ) 150 MG tablet, Take 1 tablet (150 mg total) by mouth daily., Disp: 30 tablet, Rfl: 3   levocetirizine (XYZAL ) 5 MG tablet, Take 1 tablet (5 mg total) by mouth every evening., Disp: 30 tablet, Rfl: 5   levothyroxine  (SYNTHROID ) 25 MCG tablet, Take 25 mcg by mouth daily before breakfast., Disp: , Rfl:    montelukast  (SINGULAIR ) 10 MG tablet, Take 1 tablet (10 mg total) by mouth at bedtime., Disp: 30 tablet, Rfl: 5   ondansetron  (ZOFRAN -ODT) 4 MG disintegrating tablet, Take 1 tablet (4 mg total) by mouth every 8 (eight) hours as needed for nausea or vomiting., Disp: 10 tablet, Rfl: 0   pantoprazole  (PROTONIX ) 40 MG tablet, Take 40 mg by mouth daily., Disp: , Rfl:    predniSONE  (DELTASONE ) 20 MG tablet, 2 tabs po daily x 4 days (Patient not taking: Reported on 04/03/2024), Disp: 8 tablet, Rfl: 0   rosuvastatin  (CRESTOR ) 40 MG tablet, Take 1 tablet (40 mg total) by mouth daily., Disp: 90 tablet, Rfl: 3   spironolactone  (ALDACTONE ) 25 MG tablet, Take 1 tablet (25 mg total) by mouth daily., Disp: 90 tablet, Rfl: 3   tiZANidine (ZANAFLEX) 4 MG tablet, Take 4 mg by mouth every 8 (eight) hours as needed., Disp: , Rfl:  triamcinolone cream (KENALOG) 0.1 %, Apply 1 Application topically as needed., Disp: , Rfl:    valACYclovir  (VALTREX ) 1000 MG tablet, Take 1,000 mg by mouth daily. For 5 days (Patient not taking: Reported on 04/03/2024), Disp: , Rfl:   Past Medical History: Past Medical History:  Diagnosis Date   CAD in native artery 12/11/2023   COPD (chronic obstructive pulmonary disease) (HCC)    GI bleed    Hyperlipidemia    Hypertension    Tubular adenoma of colon 2013    Tobacco Use: Social History   Tobacco Use  Smoking Status Former   Current packs/day: 0.00   Average packs/day: 1 pack/day for 40.0 years  (40.0 ttl pk-yrs)   Types: Cigarettes   Start date: 6   Quit date: 2017   Years since quitting: 8.5  Smokeless Tobacco Never    Labs: Review Flowsheet       Latest Ref Rng & Units 02/26/2023 03/31/2023 04/01/2023  Labs for ITP Cardiac and Pulmonary Rehab  Cholestrol 100 - 199 mg/dL 798  - -  LDL (calc) 0 - 99 mg/dL 879  - -  HDL-C >60 mg/dL 60  - -  Trlycerides 0 - 149 mg/dL 878  - -  Bicarbonate 79.9 - 28.0 mmol/L - 19.8  22.5   TCO2 22 - 32 mmol/L - 21  24   Acid-base deficit 0.0 - 2.0 mmol/L - 3.0  3.0   O2 Saturation % - 77  69     Capillary Blood Glucose: Lab Results  Component Value Date   GLUCAP 101 (H) 03/14/2017   GLUCAP 117 (H) 01/17/2012     Pulmonary Assessment Scores:  Pulmonary Assessment Scores     Row Name 04/03/24 1346         ADL UCSD   ADL Phase Entry     SOB Score total 20       CAT Score   CAT Score 19       mMRC Score   mMRC Score 2       UCSD: Self-administered rating of dyspnea associated with activities of daily living (ADLs) 6-point scale (0 = not at all to 5 = maximal or unable to do because of breathlessness)  Scoring Scores range from 0 to 120.  Minimally important difference is 5 units  CAT: CAT can identify the health impairment of COPD patients and is better correlated with disease progression.  CAT has a scoring range of zero to 40. The CAT score is classified into four groups of low (less than 10), medium (10 - 20), high (21-30) and very high (31-40) based on the impact level of disease on health status. A CAT score over 10 suggests significant symptoms.  A worsening CAT score could be explained by an exacerbation, poor medication adherence, poor inhaler technique, or progression of COPD or comorbid conditions.  CAT MCID is 2 points  mMRC: mMRC (Modified Medical Research Council) Dyspnea Scale is used to assess the degree of baseline functional disability in patients of respiratory disease due to dyspnea. No minimal  important difference is established. A decrease in score of 1 point or greater is considered a positive change.   Pulmonary Function Assessment:  Pulmonary Function Assessment - 04/03/24 1359       Breath   Bilateral Breath Sounds Decreased    Shortness of Breath Yes;Limiting activity          Exercise Target Goals: Exercise Program Goal: Individual exercise prescription set using results from initial  6 min walk test and THRR while considering  patient's activity barriers and safety.   Exercise Prescription Goal: Initial exercise prescription builds to 30-45 minutes a day of aerobic activity, 2-3 days per week.  Home exercise guidelines will be given to patient during program as part of exercise prescription that the participant will acknowledge.  Activity Barriers & Risk Stratification:  Activity Barriers & Cardiac Risk Stratification - 04/03/24 1347       Activity Barriers & Cardiac Risk Stratification   Activity Barriers Deconditioning;Muscular Weakness;Shortness of Breath;Arthritis    Cardiac Risk Stratification High          6 Minute Walk:  6 Minute Walk     Row Name 04/03/24 1445         6 Minute Walk   Phase Initial     Distance 1200 feet     Walk Time 6 minutes     # of Rest Breaks 1  3:11-3:39 rest due to SOB and brief 87 RA     MPH 2.27     METS 3.64     RPE 13     Perceived Dyspnea  1     VO2 Peak 12.74     Symptoms Yes (comment)     Comments Short of breath     Resting HR 55 bpm     Resting BP 120/64     Resting Oxygen Saturation  98 %     Exercise Oxygen Saturation  during 6 min walk 89 %     Max Ex. HR 122 bpm     Max Ex. BP 178/82     2 Minute Post BP 162/78       Interval HR   1 Minute HR 64     2 Minute HR 62  inaccurate     3 Minute HR 121     4 Minute HR 68     5 Minute HR 70     6 Minute HR 122     2 Minute Post HR 73     Interval Heart Rate? Yes       Interval Oxygen   Interval Oxygen? Yes     Baseline Oxygen Saturation % 98  %     1 Minute Oxygen Saturation % 96 %     1 Minute Liters of Oxygen 0 L     2 Minute Oxygen Saturation % 93 %     2 Minute Liters of Oxygen 0 L     3 Minute Oxygen Saturation % 89 %     3 Minute Liters of Oxygen 0 L     4 Minute Oxygen Saturation % 96 %     4 Minute Liters of Oxygen 0 L     5 Minute Oxygen Saturation % 94 %     5 Minute Liters of Oxygen 0 L     6 Minute Oxygen Saturation % 99 %     6 Minute Liters of Oxygen 0 L     2 Minute Post Oxygen Saturation % 98 %     2 Minute Post Liters of Oxygen 0 L        Oxygen Initial Assessment:  Oxygen Initial Assessment - 04/03/24 1345       Home Oxygen   Home Oxygen Device None    Sleep Oxygen Prescription None    Home Exercise Oxygen Prescription None    Home Resting Oxygen Prescription None    Compliance with Home Oxygen Use  Yes      Initial 6 min Walk   Oxygen Used None      Program Oxygen Prescription   Program Oxygen Prescription None      Intervention   Short Term Goals To learn and exhibit compliance with exercise, home and travel O2 prescription;To learn and understand importance of maintaining oxygen saturations>88%;To learn and demonstrate proper use of respiratory medications;To learn and understand importance of monitoring SPO2 with pulse oximeter and demonstrate accurate use of the pulse oximeter.;To learn and demonstrate proper pursed lip breathing techniques or other breathing techniques. ;Other    Long  Term Goals Exhibits compliance with exercise, home  and travel O2 prescription;Verbalizes importance of monitoring SPO2 with pulse oximeter and return demonstration;Maintenance of O2 saturations>88%;Exhibits proper breathing techniques, such as pursed lip breathing or other method taught during program session;Compliance with respiratory medication;Demonstrates proper use of MDI's          Oxygen Re-Evaluation:  Oxygen Re-Evaluation     Row Name 04/28/24 0857             Program Oxygen Prescription    Program Oxygen Prescription None         Home Oxygen   Home Oxygen Device None       Sleep Oxygen Prescription None       Home Exercise Oxygen Prescription None       Home Resting Oxygen Prescription None       Compliance with Home Oxygen Use Yes         Goals/Expected Outcomes   Short Term Goals To learn and exhibit compliance with exercise, home and travel O2 prescription;To learn and understand importance of maintaining oxygen saturations>88%;To learn and demonstrate proper use of respiratory medications;To learn and understand importance of monitoring SPO2 with pulse oximeter and demonstrate accurate use of the pulse oximeter.;To learn and demonstrate proper pursed lip breathing techniques or other breathing techniques. ;Other       Long  Term Goals Exhibits compliance with exercise, home  and travel O2 prescription;Verbalizes importance of monitoring SPO2 with pulse oximeter and return demonstration;Maintenance of O2 saturations>88%;Exhibits proper breathing techniques, such as pursed lip breathing or other method taught during program session;Compliance with respiratory medication;Demonstrates proper use of MDI's       Goals/Expected Outcomes Compliance and understanding of oxygen saturation monitoring and breathing techniques to decrease shortness of breath.          Oxygen Discharge (Final Oxygen Re-Evaluation):  Oxygen Re-Evaluation - 04/28/24 0857       Program Oxygen Prescription   Program Oxygen Prescription None      Home Oxygen   Home Oxygen Device None    Sleep Oxygen Prescription None    Home Exercise Oxygen Prescription None    Home Resting Oxygen Prescription None    Compliance with Home Oxygen Use Yes      Goals/Expected Outcomes   Short Term Goals To learn and exhibit compliance with exercise, home and travel O2 prescription;To learn and understand importance of maintaining oxygen saturations>88%;To learn and demonstrate proper use of respiratory medications;To  learn and understand importance of monitoring SPO2 with pulse oximeter and demonstrate accurate use of the pulse oximeter.;To learn and demonstrate proper pursed lip breathing techniques or other breathing techniques. ;Other    Long  Term Goals Exhibits compliance with exercise, home  and travel O2 prescription;Verbalizes importance of monitoring SPO2 with pulse oximeter and return demonstration;Maintenance of O2 saturations>88%;Exhibits proper breathing techniques, such as pursed lip breathing or other method  taught during program session;Compliance with respiratory medication;Demonstrates proper use of MDI's    Goals/Expected Outcomes Compliance and understanding of oxygen saturation monitoring and breathing techniques to decrease shortness of breath.          Initial Exercise Prescription:  Initial Exercise Prescription - 04/03/24 1400       Date of Initial Exercise RX and Referring Provider   Date 04/03/24    Referring Provider [name]    Expected Discharge Date 06/30/24      NuStep   Level 2    SPM 70    Minutes 15    METs 2.3      Track   Laps 11    Minutes 15    METs 2.2      Prescription Details   Frequency (times per week) 2    Duration Progress to 30 minutes of continuous aerobic without signs/symptoms of physical distress      Intensity   THRR 40-80% of Max Heartrate 62-124    Ratings of Perceived Exertion 11-13    Perceived Dyspnea 0-4      Progression   Progression Continue to progress workloads to maintain intensity without signs/symptoms of physical distress.      Resistance Training   Training Prescription Yes    Weight blue bands    Reps 10-15          Perform Capillary Blood Glucose checks as needed.  Exercise Prescription Changes:   Exercise Prescription Changes     Row Name 04/21/24 1400 04/21/24 1500 05/05/24 1400         Response to Exercise   Blood Pressure (Admit) 142/70 142/70 128/72     Blood Pressure (Exercise) 142/76 142/76  140/80     Blood Pressure (Exit) -- 142/74 122/72     Heart Rate (Admit) 63 bpm 63 bpm 71 bpm     Heart Rate (Exercise) 99 bpm 77 bpm 85 bpm     Heart Rate (Exit) -- 79 bpm 84 bpm     Oxygen Saturation (Admit) 97 % 97 % 98 %     Oxygen Saturation (Exercise) 94 % 97 % 96 %     Oxygen Saturation (Exit) -- 99 % 97 %     Rating of Perceived Exertion (Exercise) 12 11 13      Perceived Dyspnea (Exercise) 1 1 3      Duration Continue with 30 min of aerobic exercise without signs/symptoms of physical distress. Continue with 30 min of aerobic exercise without signs/symptoms of physical distress. Continue with 30 min of aerobic exercise without signs/symptoms of physical distress.     Intensity THRR unchanged THRR unchanged THRR unchanged       Progression   Progression Continue to progress workloads to maintain intensity without signs/symptoms of physical distress. Continue to progress workloads to maintain intensity without signs/symptoms of physical distress. Continue to progress workloads to maintain intensity without signs/symptoms of physical distress.       Resistance Training   Training Prescription Yes Yes Yes     Weight blue bands blue bands blue bands     Reps 10-15 10-15 10-15     Time 10 Minutes 10 Minutes 10 Minutes       Interval Training   Interval Training No -- --       NuStep   Level 2 2 3      SPM 73 73 76     Minutes 15 15 15      METs 2.2 2.2 2.6  Track   Laps 18 18 21      Minutes 15 15 15      METs -- 3.25 3.62        Exercise Comments:   Exercise Comments     Row Name 04/09/24 1500           Exercise Comments Pt completed first day of group exercise. She exercised on the recumbent stepper for 15 min, level 2, METs 2.3. She then walked the track for 15 min, 3.62 METs. She can walk quickly on the track but previously did not tolerate the treadmill. She performed warm up and cool down with verbal and demonstrative cues. Discussed METs.          Exercise  Goals and Review:   Exercise Goals     Row Name 04/03/24 1348             Exercise Goals   Increase Physical Activity Yes       Intervention Provide advice, education, support and counseling about physical activity/exercise needs.;Develop an individualized exercise prescription for aerobic and resistive training based on initial evaluation findings, risk stratification, comorbidities and participant's personal goals.       Expected Outcomes Short Term: Attend rehab on a regular basis to increase amount of physical activity.;Long Term: Add in home exercise to make exercise part of routine and to increase amount of physical activity.;Long Term: Exercising regularly at least 3-5 days a week.       Increase Strength and Stamina Yes       Intervention Provide advice, education, support and counseling about physical activity/exercise needs.;Develop an individualized exercise prescription for aerobic and resistive training based on initial evaluation findings, risk stratification, comorbidities and participant's personal goals.       Expected Outcomes Short Term: Increase workloads from initial exercise prescription for resistance, speed, and METs.;Short Term: Perform resistance training exercises routinely during rehab and add in resistance training at home;Long Term: Improve cardiorespiratory fitness, muscular endurance and strength as measured by increased METs and functional capacity ( )       Able to understand and use rate of perceived exertion (RPE) scale Yes       Intervention Provide education and explanation on how to use RPE scale       Expected Outcomes Short Term: Able to use RPE daily in rehab to express subjective intensity level;Long Term:  Able to use RPE to guide intensity level when exercising independently       Able to understand and use Dyspnea scale Yes       Intervention Provide education and explanation on how to use Dyspnea scale       Expected Outcomes Short Term: Able to  use Dyspnea scale daily in rehab to express subjective sense of shortness of breath during exertion;Long Term: Able to use Dyspnea scale to guide intensity level when exercising independently       Knowledge and understanding of Target Heart Rate Range (THRR) Yes       Intervention Provide education and explanation of THRR including how the numbers were predicted and where they are located for reference       Expected Outcomes Short Term: Able to state/look up THRR;Long Term: Able to use THRR to govern intensity when exercising independently;Short Term: Able to use daily as guideline for intensity in rehab       Understanding of Exercise Prescription Yes       Intervention Provide education, explanation, and written materials on patient's individual exercise prescription  Expected Outcomes Short Term: Able to explain program exercise prescription;Long Term: Able to explain home exercise prescription to exercise independently          Exercise Goals Re-Evaluation :  Exercise Goals Re-Evaluation     Row Name 04/28/24 0851             Exercise Goal Re-Evaluation   Exercise Goals Review Increase Physical Activity;Able to understand and use Dyspnea scale;Understanding of Exercise Prescription;Increase Strength and Stamina;Knowledge and understanding of Target Heart Rate Range (THRR);Able to understand and use rate of perceived exertion (RPE) scale       Comments Pt has completed 4 exercise sessions, missing one for transportation. She is motivated. She is exercising on the recumbent stepper for 15 min, level 3, METs 2.4. She then is walking the track for 15 min, 3.62 METs. She can walk quickly on the track but has attempted the treadmill twice without success. She is eager to change equipment but will discuss importance of building endurance first. She performs warm up and cool down with demonstrative cues, including squats. She is using blue bands, 5.8 lbs.       Expected Outcomes Through  exercise at rehab and at home, patient will increase physical capacity and ADL's will be easier to perform. The patient will also feel comfortable establishing a home exercise program.          Discharge Exercise Prescription (Final Exercise Prescription Changes):  Exercise Prescription Changes - 05/05/24 1400       Response to Exercise   Blood Pressure (Admit) 128/72    Blood Pressure (Exercise) 140/80    Blood Pressure (Exit) 122/72    Heart Rate (Admit) 71 bpm    Heart Rate (Exercise) 85 bpm    Heart Rate (Exit) 84 bpm    Oxygen Saturation (Admit) 98 %    Oxygen Saturation (Exercise) 96 %    Oxygen Saturation (Exit) 97 %    Rating of Perceived Exertion (Exercise) 13    Perceived Dyspnea (Exercise) 3    Duration Continue with 30 min of aerobic exercise without signs/symptoms of physical distress.    Intensity THRR unchanged      Progression   Progression Continue to progress workloads to maintain intensity without signs/symptoms of physical distress.      Resistance Training   Training Prescription Yes    Weight blue bands    Reps 10-15    Time 10 Minutes      NuStep   Level 3    SPM 76    Minutes 15    METs 2.6      Track   Laps 21    Minutes 15    METs 3.62          Nutrition:  Target Goals: Understanding of nutrition guidelines, daily intake of sodium 1500mg , cholesterol 200mg , calories 30% from fat and 7% or less from saturated fats, daily to have 5 or more servings of fruits and vegetables.  Biometrics:  Pre Biometrics - 04/03/24 1454       Pre Biometrics   Grip Strength 38 kg   old dynometer          Nutrition Therapy Plan and Nutrition Goals:  Nutrition Therapy & Goals - 04/09/24 1343       Nutrition Therapy   Diet Heart Healthy Diet    Drug/Food Interactions Statins/Certain Fruits      Personal Nutrition Goals   Nutrition Goal Patient to improve diet quality by using the plate  method as a guide for meal planning to include lean  protein/plant protein, fruits, vegetables, whole grains, nonfat dairy as part of a well-balanced diet.    Comments Patient has medical history of HTN, hyperlipidemia, CAD, COPD. Lipids have improved WNL treated with crestor  40mg . A1c has improved from 6.5 to 6.0 and is managed with diet. She reports following  a regular diet and motivation to maintain weight. Patient will continue to benefit from participation in pulmonary rehab for nutrition ,exercise and lifestyle modification.      Intervention Plan   Intervention Prescribe, educate and counsel regarding individualized specific dietary modifications aiming towards targeted core components such as weight, hypertension, lipid management, diabetes, heart failure and other comorbidities.;Nutrition handout(s) given to patient.    Expected Outcomes Short Term Goal: Understand basic principles of dietary content, such as calories, fat, sodium, cholesterol and nutrients.;Long Term Goal: Adherence to prescribed nutrition plan.          Nutrition Assessments:  MEDIFICTS Score Key: >=70 Need to make dietary changes  40-70 Heart Healthy Diet <= 40 Therapeutic Level Cholesterol Diet   Picture Your Plate Scores: <59 Unhealthy dietary pattern with much room for improvement. 41-50 Dietary pattern unlikely to meet recommendations for good health and room for improvement. 51-60 More healthful dietary pattern, with some room for improvement.  >60 Healthy dietary pattern, although there may be some specific behaviors that could be improved.    Nutrition Goals Re-Evaluation:  Nutrition Goals Re-Evaluation     Row Name 04/09/24 1343             Goals   Current Weight 147 lb 14.9 oz (67.1 kg)       Comment A1c 6.0, Lipids WNL -LDL85, HDL 42       Expected Outcome atient has medical history of HTN, hyperlipidemia, CAD, COPD. Lipids have improved WNL treated with crestor  40mg . A1c has improved from 6.5 to 6.0 and is managed with diet. She reports  following a regular diet and motivation to maintain weight. Patient will continue to benefit from participation in pulmonary rehab for nutrition ,exercise and lifestyle modification.          Nutrition Goals Discharge (Final Nutrition Goals Re-Evaluation):  Nutrition Goals Re-Evaluation - 04/09/24 1343       Goals   Current Weight 147 lb 14.9 oz (67.1 kg)    Comment A1c 6.0, Lipids WNL -LDL85, HDL 42    Expected Outcome atient has medical history of HTN, hyperlipidemia, CAD, COPD. Lipids have improved WNL treated with crestor  40mg . A1c has improved from 6.5 to 6.0 and is managed with diet. She reports following a regular diet and motivation to maintain weight. Patient will continue to benefit from participation in pulmonary rehab for nutrition ,exercise and lifestyle modification.          Psychosocial: Target Goals: Acknowledge presence or absence of significant depression and/or stress, maximize coping skills, provide positive support system. Participant is able to verbalize types and ability to use techniques and skills needed for reducing stress and depression.  Initial Review & Psychosocial Screening:  Initial Psych Review & Screening - 04/03/24 1318       Initial Review   Current issues with Current Sleep Concerns   can't fall asleep until 7 am, sleeps 2 hrs.     Family Dynamics   Good Support System? Yes    Comments son Caron      Barriers   Psychosocial barriers to participate in program The patient should benefit from  training in stress management and relaxation.      Screening Interventions   Interventions Encouraged to exercise    Expected Outcomes Short Term goal: Utilizing psychosocial counselor, staff and physician to assist with identification of specific Stressors or current issues interfering with healing process. Setting desired goal for each stressor or current issue identified.;Long Term Goal: Stressors or current issues are controlled or eliminated.;Short  Term goal: Identification and review with participant of any Quality of Life or Depression concerns found by scoring the questionnaire.;Long Term goal: The participant improves quality of Life and PHQ9 Scores as seen by post scores and/or verbalization of changes          Quality of Life Scores:  Scores of 19 and below usually indicate a poorer quality of life in these areas.  A difference of  2-3 points is a clinically meaningful difference.  A difference of 2-3 points in the total score of the Quality of Life Index has been associated with significant improvement in overall quality of life, self-image, physical symptoms, and general health in studies assessing change in quality of life.  PHQ-9: Review Flowsheet       04/03/2024  Depression screen PHQ 2/9  Decreased Interest 2  Down, Depressed, Hopeless 0  PHQ - 2 Score 2  Altered sleeping 3  Tired, decreased energy 1  Change in appetite 0  Feeling bad or failure about yourself  0  Trouble concentrating 0  Moving slowly or fidgety/restless 0  Suicidal thoughts 0  PHQ-9 Score 6  Difficult doing work/chores Not difficult at all   Interpretation of Total Score  Total Score Depression Severity:  1-4 = Minimal depression, 5-9 = Mild depression, 10-14 = Moderate depression, 15-19 = Moderately severe depression, 20-27 = Severe depression   Psychosocial Evaluation and Intervention:  Psychosocial Evaluation - 04/03/24 1341       Psychosocial Evaluation & Interventions   Interventions Encouraged to exercise with the program and follow exercise prescription;Stress management education    Expected Outcomes To participate in PR    Continue Psychosocial Services  Follow up required by staff          Psychosocial Re-Evaluation:  Psychosocial Re-Evaluation     Row Name 04/06/24 0908 05/04/24 1224           Psychosocial Re-Evaluation   Current issues with Current Sleep Concerns Current Sleep Concerns      Comments No changes  since orientation. Mariah Foster is scheduled to start the program on 6/12 Monthly psychosocial re-evaluation is as follows: Mariah Foster has attended 6 sessions so far. She continues to express current sleep concerns but hasn't spoke to her MD for any possible interventions. She denies any other psychosocial barriers or concerns. We have assisted her in getting transportation set up. We will continue to monitor and will assess her needs.      Expected Outcomes For Mariah Foster to get quality sleep, to feel well rested. To exercise in Pulm Rehab without any other barriers or concerns For Mariah Foster to get quality sleep, to feel well rested. To exercise in Pulm Rehab without any other barriers or concerns      Interventions Encouraged to attend Pulmonary Rehabilitation for the exercise Encouraged to attend Pulmonary Rehabilitation for the exercise      Continue Psychosocial Services  Follow up required by staff Follow up required by staff         Psychosocial Discharge (Final Psychosocial Re-Evaluation):  Psychosocial Re-Evaluation - 05/04/24 1224  Psychosocial Re-Evaluation   Current issues with Current Sleep Concerns    Comments Monthly psychosocial re-evaluation is as follows: Mariah Foster has attended 6 sessions so far. She continues to express current sleep concerns but hasn't spoke to her MD for any possible interventions. She denies any other psychosocial barriers or concerns. We have assisted her in getting transportation set up. We will continue to monitor and will assess her needs.    Expected Outcomes For Mariah Foster to get quality sleep, to feel well rested. To exercise in Pulm Rehab without any other barriers or concerns    Interventions Encouraged to attend Pulmonary Rehabilitation for the exercise    Continue Psychosocial Services  Follow up required by staff          Education: Education Goals: Education classes will be provided on a weekly basis, covering required topics. Participant will state  understanding/return demonstration of topics presented.  Learning Barriers/Preferences:  Learning Barriers/Preferences - 04/03/24 1342       Learning Barriers/Preferences   Learning Barriers Sight;Reading    Learning Preferences Skilled Demonstration;Individual Instruction          Education Topics: Know Your Numbers Group instruction that is supported by a PowerPoint presentation. Instructor discusses importance of knowing and understanding resting, exercise, and post-exercise oxygen saturation, heart rate, and blood pressure. Oxygen saturation, heart rate, blood pressure, rating of perceived exertion, and dyspnea are reviewed along with a normal range for these values.  Flowsheet Row PULMONARY REHAB OTHER RESPIRATORY from 04/23/2024 in [facility], Vascular, & [facility]  Date 04/23/24  Educator EP  Instruction Review Code 1- Verbalizes Understanding    Exercise for the Pulmonary Patient Group instruction that is supported by a PowerPoint presentation. Instructor discusses benefits of exercise, core components of exercise, frequency, duration, and intensity of an exercise routine, importance of utilizing pulse oximetry during exercise, safety while exercising, and options of places to exercise outside of rehab.  Flowsheet Row PULMONARY REHAB OTHER RESPIRATORY from 04/16/2024 in [facility] for Heart, Vascular, & Lung Health  Date 04/16/24  Educator EP  Instruction Review Code 1- Verbalizes Understanding    MET Level  Group instruction provided by PowerPoint, verbal discussion, and written material to support subject matter. Instructor reviews what METs are and how to increase METs.    Pulmonary Medications Verbally interactive group education provided by instructor with focus on inhaled medications and proper administration. Flowsheet Row PULMONARY REHAB OTHER RESPIRATORY from 04/09/2024 in [facility] for Heart, Vascular, & Lung Health  Date 04/09/24  Educator RT  Instruction Review Code 1- Verbalizes Understanding    Anatomy and Physiology of the Respiratory System Group instruction provided by PowerPoint, verbal discussion, and written material to support subject matter. Instructor reviews respiratory cycle and anatomical components of the respiratory system and their functions. Instructor also reviews differences in obstructive and restrictive respiratory diseases with examples of each.    Oxygen Safety Group instruction provided by PowerPoint, verbal discussion, and written material to support subject matter. There is an overview of "What is Oxygen" and "Why do we need it".  Instructor also reviews how to create a safe environment for oxygen use, the importance of using oxygen as prescribed, and the risks of noncompliance. There is a brief discussion on traveling with oxygen and resources the patient may utilize. Flowsheet Row PULMONARY REHAB OTHER RESPIRATORY from 04/30/2024 in [facility] for Heart, Vascular, & Lung Health  Date 04/30/24  Educator RN  Instruction Review Code 1- Verbalizes Understanding    Oxygen Use Group instruction provided by PowerPoint, verbal discussion, and written material to discuss how supplemental oxygen is prescribed and different types of oxygen supply systems. Resources for more information are provided.    Breathing Techniques Group instruction that is supported by demonstration and informational handouts. Instructor discusses the benefits of pursed lip and diaphragmatic breathing and detailed demonstration on how to perform both.     Risk Factor Reduction Group instruction that is supported by a PowerPoint presentation. Instructor discusses the definition of a risk factor, different risk factors for pulmonary disease, and how the heart and lungs work together.   Pulmonary Diseases Group instruction provided by  PowerPoint, verbal discussion, and written material to support subject matter. Instructor gives an overview of the different type of pulmonary diseases. There is also a discussion on risk factors and symptoms as well as ways to manage the diseases.   Stress and Energy Conservation Group instruction provided by PowerPoint, verbal discussion, and written material to support subject matter. Instructor gives an overview of stress and the impact it can have on the body. Instructor also reviews ways to reduce stress. There is also a discussion on energy conservation and ways to conserve energy throughout the day.   Warning Signs and Symptoms Group instruction provided by PowerPoint, verbal discussion, and written material to support subject matter. Instructor reviews warning signs and symptoms of stroke, heart attack, cold and flu. Instructor also reviews ways to prevent the spread of infection.   Other Education Group or individual verbal, written, or video instructions that support the educational goals of the pulmonary rehab program.    Knowledge Questionnaire Score:  Knowledge Questionnaire Score - 04/03/24 1343       Knowledge Questionnaire Score   Pre Score 14/18          Core Components/Risk Factors/Patient Goals at Admission:  Personal Goals and Risk Factors at Admission - 04/03/24 1343       Core Components/Risk Factors/Patient Goals on Admission   Improve shortness of breath with ADL's Yes    Intervention Provide education, individualized exercise plan and daily activity instruction to help decrease symptoms of SOB with activities of daily living.    Expected Outcomes Short Term: Improve cardiorespiratory fitness to achieve a reduction of symptoms when performing ADLs;Long Term: Be able to perform more ADLs without symptoms or delay the onset of symptoms          Core Components/Risk Factors/Patient Goals Review:   Goals and Risk Factor Review     Row Name 04/06/24 0910  05/04/24 1230           Core Components/Risk Factors/Patient Goals Review   Personal Goals Review Improve shortness of breath with ADL's;Develop more efficient breathing techniques such as purse lipped breathing and diaphragmatic breathing and practicing self-pacing with activity. Improve shortness of breath with ADL's;Develop more efficient breathing techniques such as purse lipped breathing and diaphragmatic breathing and practicing self-pacing with activity.      Review Unable to assess goals yet. Mariah Foster has not started the program. She is scheduled to start on 6/12 Monthly review of patient's Core Components/Risk Factors/Patient Goals are as follows: Goal in progress for improving her shortness of breath with ADLs. Mariah Foster has been able to maintain her oxygen saturation on room air with exertion. Goal met on developing more efficient breathing techniques such as purse lipped breathing and diaphragmatic breathing; and practicing self-pacing with activity. She can initiate  pursed lip breathing and is practicing diaphragmatic breathing before bed. She can self-pace herself when needed and allows herself to take breaks while walking or exercising. Mariah Foster will continue to benefit from PR for nutrition, education, exercise, and lifestyle modification.      Expected Outcomes For Mariah Foster to improve shortness of breath with ADL's and develop more efficient breathing techniques such as purse lipped breathing and diaphragmatic breathing; and practicing self-pacing with activity and improve shortness of breath with ADL's Pt will show progress toward meeting expected goals and outcomes.         Core Components/Risk Factors/Patient Goals at Discharge (Final Review):   Goals and Risk Factor Review - 05/04/24 1230       Core Components/Risk Factors/Patient Goals Review   Personal Goals Review Improve shortness of breath with ADL's;Develop more efficient breathing techniques such as purse lipped breathing and  diaphragmatic breathing and practicing self-pacing with activity.    Review Monthly review of patient's Core Components/Risk Factors/Patient Goals are as follows: Goal in progress for improving her shortness of breath with ADLs. Mariah Foster has been able to maintain her oxygen saturation on room air with exertion. Goal met on developing more efficient breathing techniques such as purse lipped breathing and diaphragmatic breathing; and practicing self-pacing with activity. She can initiate pursed lip breathing and is practicing diaphragmatic breathing before bed. She can self-pace herself when needed and allows herself to take breaks while walking or exercising. Mariah Foster will continue to benefit from PR for nutrition, education, exercise, and lifestyle modification.    Expected Outcomes Pt will show progress toward meeting expected goals and outcomes.          ITP Comments: Pt is making expected progress toward Pulmonary Rehab goals after completing 7 session(s). Recommend continued exercise, life style modification, education, and utilization of breathing techniques to increase stamina and strength, while also decreasing shortness of breath with exertion.  Dr. Slater Staff is Medical Director for Pulmonary Rehab at [facility].

## 2024-05-07 ENCOUNTER — Ambulatory Visit (HOSPITAL_COMMUNITY)

## 2024-05-07 ENCOUNTER — Encounter (HOSPITAL_COMMUNITY)
Admission: RE | Admit: 2024-05-07 | Discharge: 2024-05-07 | Disposition: A | Discharge status: Home / Self Care | Source: Ambulatory Visit | Attending: Pulmonary Disease | Admitting: Pulmonary Disease

## 2024-05-07 DIAGNOSIS — J432 Centrilobular emphysema: Secondary | ICD-10-CM

## 2024-05-12 ENCOUNTER — Ambulatory Visit (HOSPITAL_COMMUNITY)

## 2024-05-12 ENCOUNTER — Encounter (HOSPITAL_COMMUNITY)
Admission: RE | Admit: 2024-05-12 | Discharge: 2024-05-12 | Disposition: A | Discharge status: Home / Self Care | Source: Ambulatory Visit | Attending: Pulmonary Disease

## 2024-05-12 DIAGNOSIS — J432 Centrilobular emphysema: Secondary | ICD-10-CM | POA: Diagnosis not present

## 2024-05-12 NOTE — Progress Notes (Signed)
 Daily Session Note  Patient Details  Name: Mariah Foster MRN: [id number] Date of Birth: 24-Sep-1961 Referring Provider:   Conrad Ports Pulmonary Rehab Walk Test from 04/03/2024 in [facility] for Heart, Vascular, & Lung Health  Referring Provider [name]    Encounter Date: 05/12/2024  Check In:  Session Check In - 05/12/24 1326       Check-In   Supervising physician immediately available to respond to emergencies [facility] MD immediately available    Physician(s) Rosabel Mose, NP    Location [facility]    Staff Present Cloyd Aris BS, ACSM-CEP, Exercise Physiologist;Kaylee Nicholaus, MS, ACSM-CEP, Exercise Physiologist;Mary Harvy, RN, BSN;Samantha Belarus, RD, Maximo Sharps, RT    Virtual Visit No    Medication changes reported     No    Fall or balance concerns reported    No    Tobacco Cessation No Change    Warm-up and Cool-down Performed as group-led instruction   orientation only   Resistance Training Performed Yes    VAD Patient? No    PAD/SET Patient? No      Pain Assessment   Currently in Pain? No/denies    Multiple Pain Sites No          Capillary Blood Glucose: No results found for this or any previous visit (from the past 24 hours).    Social History   Tobacco Use  Smoking Status Former   Current packs/day: 0.00   Average packs/day: 1 pack/day for 40.0 years (40.0 ttl pk-yrs)   Types: Cigarettes   Start date: 67   Quit date: 2017   Years since quitting: 8.5  Smokeless Tobacco Never    Goals Met:  Proper associated with RPD/PD & O2 Sat Independence with exercise equipment Exercise tolerated well No report of concerns or symptoms today Strength training completed today  Goals Unmet:  Not Applicable  Comments: Service time is from 1306 to 1440.    Dr. Slater Staff is Medical Director for Pulmonary Rehab at [facility].

## 2024-05-14 ENCOUNTER — Encounter (HOSPITAL_COMMUNITY)
Admission: RE | Admit: 2024-05-14 | Discharge: 2024-05-14 | Disposition: A | Discharge status: Home / Self Care | Source: Ambulatory Visit | Attending: Pulmonary Disease

## 2024-05-14 ENCOUNTER — Ambulatory Visit (HOSPITAL_COMMUNITY)

## 2024-05-14 DIAGNOSIS — J432 Centrilobular emphysema: Secondary | ICD-10-CM | POA: Diagnosis not present

## 2024-05-14 NOTE — Progress Notes (Signed)
 Pulmonary Rehab Incomplete Session Note  Patient Details  Name: BLANDINA RENALDO MRN: [id number] Date of Birth: 1961/05/31 Referring Provider:   Conrad Ports Pulmonary Rehab Walk Test from 04/03/2024 in [facility] for Heart, Vascular, & Lung Health  Referring Provider [name]    Ryleeann A Gentile did not complete her rehab session. Melodi's BP was 166/72 pre-exercise. Recheck 20 minutes later and went up to 185/78. Caylynn was asymptomatic. Krystina was directed to call her MD. Bethenny understands without assistance.

## 2024-05-14 NOTE — Progress Notes (Signed)
 Daily Session Note  Patient Details  Name: Mariah Foster MRN: [id number] Date of Birth: 1961-10-02 Referring Provider:   Conrad Ports Pulmonary Rehab Walk Test from 04/03/2024 in [facility] for Heart, Vascular, & Lung Health  Referring Provider [name]    Encounter Date: 05/14/2024  Check In:  Session Check In - 05/14/24 1334       Check-In   Supervising physician immediately available to respond to emergencies [facility] MD immediately available    Physician(s) Lum Louis, NP    [facility]    Staff Present Cloyd Aris BS, ACSM-CEP, Exercise Physiologist;Kaylee Nicholaus, MS, ACSM-CEP, Exercise Physiologist;Lashaundra Lehrmann Harvy, RN, BSN;Casey Smith, RT;Samantha Belarus, RD, LDN    Virtual Visit No    Medication changes reported     No    Fall or balance concerns reported    No    Tobacco Cessation No Change    Warm-up and Cool-down Performed as group-led instruction   orientation only   Resistance Training Performed Yes    VAD Patient? No    PAD/SET Patient? No      Pain Assessment   Currently in Pain? No/denies    Multiple Pain Sites No          Capillary Blood Glucose: No results found for this or any previous visit (from the past 24 hours).    Social History   Tobacco Use  Smoking Status Former   Current packs/day: 0.00   Average packs/day: 1 pack/day for 40.0 years (40.0 ttl pk-yrs)   Types: Cigarettes   Start date: 61   Quit date: 2017   Years since quitting: 8.5  Smokeless Tobacco Never    Goals Met:  Exercise tolerated well Queuing for purse lip breathing No report of concerns or symptoms today Strength training completed today  Goals Unmet:  Not Applicable  Comments: Service time is from 1309 to 1447    Dr. Slater Staff is Medical Director for Pulmonary Rehab at [facility].

## 2024-05-19 ENCOUNTER — Encounter (HOSPITAL_COMMUNITY)
Admission: RE | Admit: 2024-05-19 | Discharge: 2024-05-19 | Disposition: A | Discharge status: Home / Self Care | Source: Ambulatory Visit | Attending: Pulmonary Disease | Admitting: Pulmonary Disease

## 2024-05-19 ENCOUNTER — Ambulatory Visit (HOSPITAL_COMMUNITY)

## 2024-05-19 VITALS — Wt 151.0 lb

## 2024-05-19 DIAGNOSIS — J432 Centrilobular emphysema: Secondary | ICD-10-CM

## 2024-05-19 NOTE — Progress Notes (Signed)
 Daily Session Note  Patient Details  Name: Mariah Foster MRN: [id number] Date of Birth: 1961/03/08 Referring Provider:   Conrad Ports Pulmonary Rehab Walk Test from 04/03/2024 in [facility] for Heart, Vascular, & Lung Health  Referring Provider [name]    Encounter Date: 05/19/2024  Check In:  Session Check In - 05/19/24 1433       Check-In   Supervising physician immediately available to respond to emergencies [facility] MD immediately available    Physician(s) Rosaline Skains, NP    [facility]    Staff Present Johnnie Moats, MS, ACSM-CEP, Exercise Physiologist;Mary Harvy, RN, BSN;Sasha Rueth Claudene Meline Belarus, RD, LDN    Virtual Visit No    Medication changes reported     No    Fall or balance concerns reported    No    Tobacco Cessation No Change    Warm-up and Cool-down Performed as group-led instruction   orientation only   Resistance Training Performed Yes    VAD Patient? No    PAD/SET Patient? No      Pain Assessment   Currently in Pain? No/denies    Multiple Pain Sites No          Capillary Blood Glucose: No results found for this or any previous visit (from the past 24 hours).   Exercise Prescription Changes - 05/19/24 1500       Response to Exercise   Blood Pressure (Admit) 152/60    Blood Pressure (Exercise) 150/68    Blood Pressure (Exit) 130/70    Heart Rate (Admit) 72 bpm    Heart Rate (Exercise) 84 bpm    Heart Rate (Exit) 75 bpm    Oxygen Saturation (Admit) 96 %    Oxygen Saturation (Exercise) 97 %    Oxygen Saturation (Exit) 98 %    Rating of Perceived Exertion (Exercise) 13    Perceived Dyspnea (Exercise) 2    Duration Continue with 30 min of aerobic exercise without signs/symptoms of physical distress.    Intensity THRR unchanged      Progression   Progression Continue to progress workloads to maintain intensity without signs/symptoms of physical distress.      Resistance Training   Training  Prescription Yes    Weight blue bands    Reps 10-15    Time 10 Minutes      NuStep   Level 4    SPM 68    Minutes 15    METs 2.2      Track   Laps 8    Minutes 15    METs 1.8          Social History   Tobacco Use  Smoking Status Former   Current packs/day: 0.00   Average packs/day: 1 pack/day for 40.0 years (40.0 ttl pk-yrs)   Types: Cigarettes   Start date: 60   Quit date: 2017   Years since quitting: 8.5  Smokeless Tobacco Never    Goals Met:  Proper associated with RPD/PD & O2 Sat Independence with exercise equipment Exercise tolerated well No report of concerns or symptoms today Strength training completed today  Goals Unmet:  Not Applicable  Comments: Service time is from 1311 to 1439.    Dr. Slater Staff is Medical Director for Pulmonary Rehab at [facility].

## 2024-05-21 ENCOUNTER — Encounter (HOSPITAL_COMMUNITY)
Admission: RE | Admit: 2024-05-21 | Discharge: 2024-05-21 | Disposition: A | Discharge status: Home / Self Care | Source: Ambulatory Visit | Attending: Pulmonary Disease

## 2024-05-21 ENCOUNTER — Ambulatory Visit (HOSPITAL_COMMUNITY)

## 2024-05-21 ENCOUNTER — Telehealth (HOSPITAL_COMMUNITY): Payer: Self-pay

## 2024-05-21 DIAGNOSIS — J432 Centrilobular emphysema: Secondary | ICD-10-CM | POA: Diagnosis not present

## 2024-05-21 NOTE — Telephone Encounter (Signed)
 Called Mariah Foster's PCP about hypertension on Pulm Rehab. Mariah Foster has not increased her does of ibersartan per PCP recs on 7/11 due to her meds being pre-packaged by the pharmacy. Awaiting call back.

## 2024-05-21 NOTE — Progress Notes (Signed)
 Daily Session Note  Patient Details  Name: Mariah Foster MRN: [id number] Date of Birth: [birth year] Referring Provider:   Conrad Ports Pulmonary Rehab Walk Test from 04/03/2024 in [facility] for Heart, Vascular, & Lung Health  Referring Provider [name]    Encounter Date: 05/21/2024  Check In:  Session Check In - 05/21/24 1344       Check-In   Supervising physician immediately available to respond to emergencies [facility] MD immediately available    Physician(s) Lum Louis, NP    [facility]    Staff Present Johnnie Moats, MS, ACSM-CEP, Exercise Physiologist;Mary Harvy, RN, BSN;Shravan Salahuddin Claudene, [place], RN, BSN    Virtual Visit No    Medication changes reported     No    Fall or balance concerns reported    No    Tobacco Cessation No Change    Warm-up and Cool-down Performed as group-led instruction   orientation only   Resistance Training Performed Yes    VAD Patient? No    PAD/SET Patient? No      Pain Assessment   Currently in Pain? No/denies    Multiple Pain Sites No          Capillary Blood Glucose: No results found for this or any previous visit (from the past 24 hours).    Social History   Tobacco Use  Smoking Status Former   Current packs/day: 0.00   Average packs/day: 1 pack/day for 40.0 years (40.0 ttl pk-yrs)   Types: Cigarettes   Start date: 64   Quit date: 2017   Years since quitting: 8.5  Smokeless Tobacco Never    Goals Met:  Proper associated with RPD/PD & O2 Sat Independence with exercise equipment Exercise tolerated well No report of concerns or symptoms today Strength training completed today  Goals Unmet:  Not Applicable  Comments: Service time is from 1305 to 1444.    Dr. Slater Staff is Medical Director for Pulmonary Rehab at [facility].

## 2024-05-22 ENCOUNTER — Ambulatory Visit: Admitting: Pulmonary Disease

## 2024-05-22 DIAGNOSIS — J432 Centrilobular emphysema: Secondary | ICD-10-CM

## 2024-05-22 NOTE — Patient Instructions (Signed)
 PFT Test performed 05/04/24 at hospital.

## 2024-05-22 NOTE — Progress Notes (Signed)
 Test performed 05/04/24 at hospital.

## 2024-05-25 ENCOUNTER — Encounter: Payer: Self-pay | Admitting: Pulmonary Disease

## 2024-05-25 ENCOUNTER — Encounter

## 2024-05-25 ENCOUNTER — Ambulatory Visit (INDEPENDENT_AMBULATORY_CARE_PROVIDER_SITE_OTHER): Admitting: Pulmonary Disease

## 2024-05-25 VITALS — BP 154/90 | HR 70 | Ht 64.0 in | Wt 149.0 lb

## 2024-05-25 DIAGNOSIS — J432 Centrilobular emphysema: Secondary | ICD-10-CM | POA: Diagnosis not present

## 2024-05-25 NOTE — Progress Notes (Unsigned)
 Synopsis: Referred in April 2024 for COPD by Powell Sorrow, MD  Subjective:   PATIENT ID: Mariah Foster GENDER: female DOB: [birth year], MRN: [id number]  HPI  Chief Complaint  Patient presents with   Follow-up    63m emphysema f/u Pt states that she is doing so-so but has no concerns to address.    Mariah Foster is a 63 year old woman, former smoker with hypertension who returns to pulmonary clinic for COPD.   Initial OV 01/30/23 She was seen in cardiology clinic 01/01/23, note reviewed. She has been ordered for an echocardiogram.   She has significant cough that bothers her all day and it wakes her up at night. She has wheezing and exertional shortness of breath. She has been using symbicort 160-4.5mcg as needed and as needed albuterol .   She has 45 pack year history. Quit smoking 1-2 years ago. She had second hand smoke in childhood from her father. No FH of lung disease. She worked in Public affairs consultant, currently retired.    OV 05/29/23 She was admitted 6/3 to 6/5 for COPD exacerbation. Her breathing has not been great since. She was seen by allergy immunology 05/23/23 with spirometry which showed FEV1 0.72L (35%) FVC 1.13L (43%) ratio 64. She did have significant bronchodilator response.  She is using albuterol  inhaler as needed. She does not have any maintenance inhaler at this time.   OV 02/04/24 She experiences significant worsening of her COPD symptoms, with shortness of breath even during minimal exertion, such as walking short distances to the store or the dumpster near her home. She uses an albuterol  inhaler, two puffs in the morning and evening, and a nebulizer as needed. She notes that her pharmacy no longer carries one of her inhalers. She is not currently taking Spiriva  but uses Breztri  and albuterol  inhalers. She is unsure about her use of montelukast  or Singulair , as she takes multiple medications and has assistance managing them.  She describes persistent  abdominal pain characterized by a 'knot' on her right side that radiates to her back, causing severe discomfort. The pain is likened to a 'doggone ball' and is associated with nausea but no vomiting. Previous colonoscopy and upper ultrasound did not reveal any abnormalities. She experiences intermittent constipation and uses muscle relaxers for pain relief, though they provide little relief.  She lives in a multi-level home and has difficulty with the stairs, needing to climb thirteen steps to access her bathroom and bedroom. She has requested a letter to facilitate a move to a flat due to her mobility issues. She quit smoking approximately six to seven years ago.  Past Medical History:  Diagnosis Date   CAD in native artery 12/11/2023   COPD (chronic obstructive pulmonary disease) (HCC)    GI bleed    Hyperlipidemia    Hypertension    Tubular adenoma of colon 2013     Family History  Problem Relation Age of Onset   Heart attack Father    Diabetes Sister        x 2   Diabetes Brother      Social History   Socioeconomic History   Marital status: Single    Spouse name: Not on file   Number of children: 2   Years of education: Not on file   Highest education level: High school graduate  Occupational History   Occupation: cleaning service  Tobacco Use   Smoking status: Former    Current packs/day: 0.00    Average packs/day: 1  pack/day for 40.0 years (40.0 ttl pk-yrs)    Types: Cigarettes    Start date: 44    Quit date: 2017    Years since quitting: 8.5   Smokeless tobacco: Never  Vaping Use   Vaping status: Every Day  Substance and Sexual Activity   Alcohol use: Yes    Comment: socially   Drug use: No   Sexual activity: Never  Other Topics Concern   Not on file  Social History Narrative   Not on file   Social Drivers of Health   Financial Resource Strain: High Risk (01/22/2024)   Overall Financial Resource Strain (CARDIA)    Difficulty of Paying Living Expenses:  Hard  Food Insecurity: Food Insecurity Present (01/22/2024)   Hunger Vital Sign    Worried About Running Out of Food in the Last Year: Sometimes true    Ran Out of Food in the Last Year: Sometimes true  Transportation Needs: No Transportation Needs (12/11/2023)   PRAPARE - Administrator, Civil Service (Medical): No    Lack of Transportation (Non-Medical): No  Physical Activity: Not on File (11/14/2022)   Received from [facility]   Physical Activity    Physical Activity: 0  Stress: Not on File (11/14/2022)   Received from [facility]   Stress    Stress: 0  Social Connections: Not on File (07/06/2023)   Received from [facility]   Social Connections    Connectedness: 0  Intimate Partner Violence: Unknown (01/31/2022)   Received from [facility]   HITS    Physically Hurt: Not on file    Insult or Talk Down To: Not on file    Threaten Physical Harm: Not on file    Scream or Curse: Not on file     Allergies  Allergen Reactions   Flagyl [Metronidazole]     unknown   Metronidazole Hcl Hives    Stomach cramps     Outpatient Medications Prior to Visit  Medication Sig Dispense Refill   albuterol  (PROVENTIL ) (2.5 MG/3ML) 0.083% nebulizer solution Take 3 mLs (2.5 mg total) by nebulization 3 (three) times daily as needed for wheezing or shortness of breath. 75 mL 0   albuterol  (VENTOLIN  HFA) 108 (90 Base) MCG/ACT inhaler Inhale 2 puffs into the lungs every 6 (six) hours as needed for wheezing or shortness of breath. 8 g 11   amLODipine  (NORVASC ) 5 MG tablet Take 1 tablet (5 mg total) by mouth daily. 90 tablet 3   azelastine  (ASTELIN ) 0.1 % nasal spray Place 2 sprays into both nostrils 2 (two) times daily. Use in each nostril as directed     Blood Pressure Monitoring (BLOOD PRESSURE CUFF) MISC Check blood pressure daily for elevated blood pressure 1 each 0   budeson-glycopyrrolate-formoterol (BREZTRI  AEROSPHERE) 160-9-4.8 MCG/ACT AERO inhaler Inhale 2 puffs into the lungs 2 (two) times daily.  10.7 g 11   cetirizine (ZYRTEC) 10 MG tablet Take 10 mg by mouth daily.     dicyclomine  (BENTYL ) 20 MG tablet Take 1 tablet (20 mg total) by mouth 2 (two) times daily. 20 tablet 0   doxycycline  (VIBRAMYCIN ) 100 MG capsule Take 1 capsule (100 mg total) by mouth 2 (two) times daily. One po bid x 7 days 14 capsule 0   FEROSUL 325 (65 Fe) MG tablet Take 325 mg by mouth daily.     hydrOXYzine  (ATARAX ) 25 MG tablet Take 25 mg by mouth every 6 (six) hours as needed (sleep).     irbesartan  (AVAPRO ) 150  MG tablet Take 1 tablet (150 mg total) by mouth daily. 30 tablet 3   levocetirizine (XYZAL ) 5 MG tablet Take 1 tablet (5 mg total) by mouth every evening. 30 tablet 5   levothyroxine  (SYNTHROID ) 25 MCG tablet Take 25 mcg by mouth daily before breakfast.     montelukast  (SINGULAIR ) 10 MG tablet Take 1 tablet (10 mg total) by mouth at bedtime. 30 tablet 5   ondansetron  (ZOFRAN -ODT) 4 MG disintegrating tablet Take 1 tablet (4 mg total) by mouth every 8 (eight) hours as needed for nausea or vomiting. 10 tablet 0   pantoprazole  (PROTONIX ) 40 MG tablet Take 40 mg by mouth daily.     predniSONE  (DELTASONE ) 20 MG tablet 2 tabs po daily x 4 days 8 tablet 0   rosuvastatin  (CRESTOR ) 40 MG tablet Take 1 tablet (40 mg total) by mouth daily. 90 tablet 3   spironolactone  (ALDACTONE ) 25 MG tablet Take 1 tablet (25 mg total) by mouth daily. 90 tablet 3   tiZANidine (ZANAFLEX) 4 MG tablet Take 4 mg by mouth every 8 (eight) hours as needed.     triamcinolone cream (KENALOG) 0.1 % Apply 1 Application topically as needed.     valACYclovir  (VALTREX ) 1000 MG tablet Take 1,000 mg by mouth daily. For 5 days     No facility-administered medications prior to visit.    Review of Systems  Constitutional:  Negative for chills, fever, malaise/fatigue and weight loss.  HENT:  Negative for congestion, sinus pain and sore throat.   Eyes: Negative.   Respiratory:  Positive for cough, sputum production and shortness of breath. Negative  for hemoptysis and wheezing.   Cardiovascular:  Negative for chest pain, palpitations, orthopnea, claudication and leg swelling.  Gastrointestinal:  Positive for abdominal pain (right sided, intermittent). Negative for heartburn, nausea and vomiting.  Genitourinary: Negative.   Musculoskeletal:  Negative for joint pain and myalgias.  Skin:  Negative for rash.  Neurological:  Positive for headaches. Negative for weakness.  Endo/Heme/Allergies: Negative.   Psychiatric/Behavioral: Negative.     Objective:   Vitals:   05/25/24 1331 05/25/24 1333  BP: (!) 157/92 (!) 154/90  Pulse: 70   SpO2: 98%   Weight: 149 lb (67.6 kg)   Height: 5' 4 (1.626 m)     Physical Exam Constitutional:      General: She is not in acute distress.    Appearance: She is not ill-appearing.  HENT:     Head: Normocephalic and atraumatic.  Eyes:     General: No scleral icterus. Cardiovascular:     Rate and Rhythm: Normal rate and regular rhythm.     Pulses: Normal pulses.     Heart sounds: Normal heart sounds. No murmur heard. Pulmonary:     Effort: Pulmonary effort is normal.     Breath sounds: Decreased air movement present. No wheezing, rhonchi or rales.  Musculoskeletal:     Right lower leg: No edema.     Left lower leg: No edema.  Skin:    General: Skin is warm and dry.  Neurological:     General: No focal deficit present.     Mental Status: She is alert.    CBC    Component Value Date/Time   WBC 8.0 12/18/2023 1926   RBC 4.17 12/18/2023 1926   HGB 13.0 12/18/2023 1926   HGB 14.1 09/05/2023 1219   HCT 39.7 12/18/2023 1926   HCT 42.5 09/05/2023 1219   PLT 327 12/18/2023 1926   PLT 326 09/05/2023  1219   MCV 95.2 12/18/2023 1926   MCV 91 09/05/2023 1219   MCH 31.2 12/18/2023 1926   MCHC 32.7 12/18/2023 1926   RDW 14.3 12/18/2023 1926   RDW 12.9 09/05/2023 1219   LYMPHSABS 3.6 12/18/2023 1926   MONOABS 0.6 12/18/2023 1926   EOSABS 0.4 12/18/2023 1926   BASOSABS 0.1 12/18/2023 1926       Latest Ref Rng & Units 01/29/2024    9:46 AM 12/18/2023    7:26 PM 12/01/2023    3:15 PM  BMP  Glucose 70 - 99 mg/dL 897  776  896   BUN 8 - 27 mg/dL 14  30  29    Creatinine 0.57 - 1.00 mg/dL 8.85  8.76  8.72   BUN/Creat Ratio 12 - 28 12     Sodium 134 - 144 mmol/L 141  140  142   Potassium 3.5 - 5.2 mmol/L 4.3  3.6  3.4   Chloride 96 - 106 mmol/L 102  106  106   CO2 20 - 29 mmol/L 25  24  27    Calcium  8.7 - 10.3 mg/dL 9.4  9.0  9.1    Chest imaging: CT Chest 03/31/23 Cardiovascular: There are no filling defects within the pulmonary arteries to suggest pulmonary embolus. Breathing motion artifact at the bases limits detailed assessment. The central pulmonary artery is mildly dilated at 3.2 cm as before. The heart is normal in size. Coronary artery calcifications. No pericardial effusion. Contrast refluxes into the hepatic veins and IVC. Aortic atherosclerosis without aneurysm or acute aortic findings.   Mediastinum/Nodes: Reactive appearing bilateral hilar nodes, not enlarged by size criteria. There is no mediastinal adenopathy. Esophagus is patulous.   Lungs/Pleura: Apical predominant emphysema. No acute or focal airspace disease. No pleural effusion, pneumothorax or features of pulmonary edema. No suspicious pulmonary nodule.  CTA Chest 12/15/21 Cardiovascular: Good contrast bolus timing in the pulmonary arterial tree. Mild enlargement of the central pulmonary arteries (series 7, image 188). Normal bilateral pulmonary artery enhancement.   No focal filling defect identified in the pulmonary arteries to suggest acute pulmonary embolism.   Calcified coronary artery atherosclerosis (series 7, image 228). No cardiomegaly or pericardial effusion. Negative visible aorta aside from atherosclerosis.   Mediastinum/Nodes: Negative. No mediastinal mass or lymphadenopathy.   Lungs/Pleura: Moderate to severe upper lobe centrilobular emphysema. Less pronounced middle and lower  lobe emphysema. Large lung volumes. There is mild dependent retained secretions throughout the trachea, but the major airways remain patent. There is widespread bronchial wall thickening. No pleural effusion or evidence of acute pulmonary inflammation. No pulmonary nodule.  PFT:    Latest Ref Rng & Units 05/04/2024   10:39 AM  PFT Results  FVC-Pre L 1.46   FVC-Predicted Pre % 46   FVC-Post L 1.80   FVC-Predicted Post % 57   Pre FEV1/FVC % % 61   Post FEV1/FCV % % 55   FEV1-Pre L 0.89   FEV1-Predicted Pre % 36   FEV1-Post L 1.00   DLCO uncorrected ml/min/mmHg 9.34   DLCO UNC% % 47   DLVA Predicted % 78   TLC L 5.15   TLC % Predicted % 101   RV % Predicted % 168     Labs:  Path:  Echo: LV EF 60-65%. Grade I diastolic dysfunction. RV systolic function is normal.  Heart Catheterization:    Assessment & Plan:   Centrilobular emphysema (HCC)  Discussion: Mariah Foster is a 63 year old woman, former smoker with hypertension who  returns to pulmonary clinic for COPD.   Chronic Obstructive Pulmonary Disease (COPD) Significant dyspnea with minimal exertion.  Discussed potential lung volume reduction surgery pending pulmonary function test results. - Refer to pulmonary rehab program. - Order pulmonary function tests. - Consider lung volume reduction procedure based on test results. - Advise using the nebulizer in the morning/evening before Breztri . - Refill Breztri  and albuterol  prescriptions.  Abdominal Pain Intermittent right-sided pain with nausea. Previous imaging unremarkable. Differential includes muscular pain or constipation-related discomfort vs gallbladder issues. LFTs and imaging without issue. - Consider further evaluation if symptoms persist by general surgery - Advise use of MiraLAX  if constipation is suspected.  Housing Accommodation Needs Difficulty with stairs due to COPD, requiring relocation to a flat. - Provide a letter to assist with housing  accommodation to move to a flat.  Follow up in 3 months  Dorn Chill, MD [facility] Office: [phone]  Bronchoscopic Lung Volume Reduction Criteria:  - Diagnosis of emphysema confirmed by CT  - BMI <35 kg/m2  - Stable with 20mg  prednisone  (or equivalent) daily  - RV >/= 175% predicted (>/= 200% if homogeneous)  - FEV1 15-45% predicted  - TLC >100% predicted  - 100-58m (150-557m if homogeneous)  - Not actively smoking (for at least 4 months)  - ABG with PaCO2 <50 on room air  - ABG with PaO2 >45 on room air  - ECHO with EF >45% and sPAP <51mmHg  - Actively participating in pulmonary rehab     Current Outpatient Medications:    albuterol  (PROVENTIL ) (2.5 MG/3ML) 0.083% nebulizer solution, Take 3 mLs (2.5 mg total) by nebulization 3 (three) times daily as needed for wheezing or shortness of breath., Disp: 75 mL, Rfl: 0   albuterol  (VENTOLIN  HFA) 108 (90 Base) MCG/ACT inhaler, Inhale 2 puffs into the lungs every 6 (six) hours as needed for wheezing or shortness of breath., Disp: 8 g, Rfl: 11   amLODipine  (NORVASC ) 5 MG tablet, Take 1 tablet (5 mg total) by mouth daily., Disp: 90 tablet, Rfl: 3   azelastine  (ASTELIN ) 0.1 % nasal spray, Place 2 sprays into both nostrils 2 (two) times daily. Use in each nostril as directed, Disp: , Rfl:    Blood Pressure Monitoring (BLOOD PRESSURE CUFF) MISC, Check blood pressure daily for elevated blood pressure, Disp: 1 each, Rfl: 0   budeson-glycopyrrolate-formoterol (BREZTRI  AEROSPHERE) 160-9-4.8 MCG/ACT AERO inhaler, Inhale 2 puffs into the lungs 2 (two) times daily., Disp: 10.7 g, Rfl: 11   cetirizine (ZYRTEC) 10 MG tablet, Take 10 mg by mouth daily., Disp: , Rfl:    dicyclomine  (BENTYL ) 20 MG tablet, Take 1 tablet (20 mg total) by mouth 2 (two) times daily., Disp: 20 tablet, Rfl: 0   doxycycline  (VIBRAMYCIN ) 100 MG capsule, Take 1 capsule (100 mg total) by mouth 2 (two) times daily. One po bid x 7 days, Disp: 14  capsule, Rfl: 0   FEROSUL 325 (65 Fe) MG tablet, Take 325 mg by mouth daily., Disp: , Rfl:    hydrOXYzine  (ATARAX ) 25 MG tablet, Take 25 mg by mouth every 6 (six) hours as needed (sleep)., Disp: , Rfl:    irbesartan  (AVAPRO ) 150 MG tablet, Take 1 tablet (150 mg total) by mouth daily., Disp: 30 tablet, Rfl: 3   levocetirizine (XYZAL ) 5 MG tablet, Take 1 tablet (5 mg total) by mouth every evening., Disp: 30 tablet, Rfl: 5   levothyroxine  (SYNTHROID ) 25 MCG tablet, Take 25 mcg by mouth daily before breakfast., Disp: ,  Rfl:    montelukast  (SINGULAIR ) 10 MG tablet, Take 1 tablet (10 mg total) by mouth at bedtime., Disp: 30 tablet, Rfl: 5   ondansetron  (ZOFRAN -ODT) 4 MG disintegrating tablet, Take 1 tablet (4 mg total) by mouth every 8 (eight) hours as needed for nausea or vomiting., Disp: 10 tablet, Rfl: 0   pantoprazole  (PROTONIX ) 40 MG tablet, Take 40 mg by mouth daily., Disp: , Rfl:    predniSONE  (DELTASONE ) 20 MG tablet, 2 tabs po daily x 4 days, Disp: 8 tablet, Rfl: 0   rosuvastatin  (CRESTOR ) 40 MG tablet, Take 1 tablet (40 mg total) by mouth daily., Disp: 90 tablet, Rfl: 3   spironolactone  (ALDACTONE ) 25 MG tablet, Take 1 tablet (25 mg total) by mouth daily., Disp: 90 tablet, Rfl: 3   tiZANidine (ZANAFLEX) 4 MG tablet, Take 4 mg by mouth every 8 (eight) hours as needed., Disp: , Rfl:    triamcinolone cream (KENALOG) 0.1 %, Apply 1 Application topically as needed., Disp: , Rfl:    valACYclovir  (VALTREX ) 1000 MG tablet, Take 1,000 mg by mouth daily. For 5 days, Disp: , Rfl:

## 2024-05-25 NOTE — Patient Instructions (Addendum)
 Your breathing tests show severe obstruction  Continue breztri  inhaler 2 puffs twice daily - rinse mouth out after each use  Continue albuterol  inhaler 1-2 puffs every 4-6 hours as needed  We will check an overnight oxygen test on room air  Follow up in 6 months

## 2024-05-26 ENCOUNTER — Encounter (HOSPITAL_COMMUNITY)
Admission: RE | Admit: 2024-05-26 | Discharge: 2024-05-26 | Disposition: A | Discharge status: Home / Self Care | Source: Ambulatory Visit | Attending: Pulmonary Disease

## 2024-05-26 ENCOUNTER — Encounter: Payer: Self-pay | Admitting: Pulmonary Disease

## 2024-05-26 ENCOUNTER — Ambulatory Visit (HOSPITAL_COMMUNITY)

## 2024-05-26 DIAGNOSIS — J432 Centrilobular emphysema: Secondary | ICD-10-CM | POA: Diagnosis not present

## 2024-05-26 NOTE — Progress Notes (Signed)
 Daily Session Note  Patient Details  Name: Mariah Foster MRN: [id number] Date of Birth: 1961/10/18 Referring Provider:   Conrad Ports Pulmonary Rehab Walk Test from 04/03/2024 in [facility]  Referring Provider [name]    Encounter Date: 05/26/2024  Check In:  Session Check In - 05/26/24 1324       Check-In   Supervising physician immediately available to respond to emergencies [facility] MD immediately available    Physician(s) Barnie Press, NP    Location [facility]    Staff Present Johnnie Moats, MS, ACSM-CEP, Exercise Physiologist;Mary Harvy, RN, BSN;Pegeen Stiger, RT;Randi Reeve BS, ACSM-CEP, Exercise Physiologist    Virtual Visit No    Medication changes reported     No    Fall or balance concerns reported    No    Tobacco Cessation No Change    Warm-up and Cool-down Performed as group-led instruction   orientation only   Resistance Training Performed Yes    VAD Patient? No    PAD/SET Patient? No      Pain Assessment   Currently in Pain? No/denies    Multiple Pain Sites No          Capillary Blood Glucose: No results found for this or any previous visit (from the past 24 hours).    Social History   Tobacco Use  Smoking Status Former   Current packs/day: 0.00   Average packs/day: 1 pack/day for 40.0 years (40.0 ttl pk-yrs)   Types: Cigarettes   Start date: 62   Quit date: 2017   Years since quitting: 8.5  Smokeless Tobacco Never    Goals Met:  Proper associated with RPD/PD & O2 Sat Independence with exercise equipment Exercise tolerated well No report of concerns or symptoms today Strength training completed today  Goals Unmet:  Not Applicable  Comments: Service time is from 1307 to 1442.    Dr. Slater Staff is Medical Director for Pulmonary Rehab at [facility].

## 2024-05-28 ENCOUNTER — Encounter (HOSPITAL_COMMUNITY)
Admission: RE | Admit: 2024-05-28 | Discharge: 2024-05-28 | Disposition: A | Discharge status: Home / Self Care | Source: Ambulatory Visit | Attending: Pulmonary Disease | Admitting: Pulmonary Disease

## 2024-05-28 ENCOUNTER — Ambulatory Visit (HOSPITAL_COMMUNITY)

## 2024-05-28 DIAGNOSIS — J432 Centrilobular emphysema: Secondary | ICD-10-CM | POA: Diagnosis not present

## 2024-05-28 NOTE — Progress Notes (Signed)
 Daily Session Note  Patient Details  Name: Mariah Foster MRN: [id number] Date of Birth: [birth year] Referring Provider:   Conrad Ports Pulmonary Rehab Walk Test from 04/03/2024 in [facility]  Referring Provider [name]    Encounter Date: 05/28/2024  Check In:  Session Check In - 05/28/24 1327       Check-In   Supervising physician immediately available to respond to emergencies [facility] MD immediately available    Physician(s) Josefa Beauvais, NP    Location [facility]    Staff Present Johnnie Moats, MS, ACSM-CEP, Exercise Physiologist;Mary Harvy, RN, BSN;Casey Claudene, RT;Natallia Stellmach BS, ACSM-CEP, Exercise Physiologist    Virtual Visit No    Medication changes reported     No    Fall or balance concerns reported    No    Tobacco Cessation No Change    Warm-up and Cool-down Performed as group-led instruction   orientation only   Resistance Training Performed Yes    VAD Patient? No    PAD/SET Patient? No      Pain Assessment   Currently in Pain? No/denies    Multiple Pain Sites No          Capillary Blood Glucose: No results found for this or any previous visit (from the past 24 hours).    Social History   Tobacco Use  Smoking Status Former   Current packs/day: 0.00   Average packs/day: 1 pack/day for 40.0 years (40.0 ttl pk-yrs)   Types: Cigarettes   Start date: 57   Quit date: 2017   Years since quitting: 8.5  Smokeless Tobacco Never    Goals Met:  Exercise tolerated well No report of concerns or symptoms today Strength training completed today  Goals Unmet:  Not Applicable  Comments: Service time is from 1310 to 1448.    Dr. Slater Staff is Medical Director for Pulmonary Rehab at [facility].

## 2024-06-02 ENCOUNTER — Ambulatory Visit (HOSPITAL_COMMUNITY)

## 2024-06-02 ENCOUNTER — Encounter (HOSPITAL_COMMUNITY)
Admission: RE | Admit: 2024-06-02 | Discharge: 2024-06-02 | Disposition: A | Discharge status: Home / Self Care | Source: Ambulatory Visit | Attending: Pulmonary Disease | Admitting: Pulmonary Disease

## 2024-06-02 VITALS — Wt 152.1 lb

## 2024-06-02 DIAGNOSIS — J432 Centrilobular emphysema: Secondary | ICD-10-CM | POA: Insufficient documentation

## 2024-06-02 NOTE — Progress Notes (Signed)
 Daily Session Note  Patient Details  Name: Mariah Foster MRN: [id number] Date of Birth: Sep 19, 1961 Referring Provider:   Conrad Ports Pulmonary Rehab Walk Test from 04/03/2024 in [facility]  Referring Provider [name]    Encounter Date: 06/02/2024  Check In:  Session Check In - 06/02/24 1323       Check-In   Supervising physician immediately available to respond to emergencies [facility] MD immediately available    Physician(s) Josefa Beauvais, NP    Location [facility]    Staff Present Johnnie Moats, MS, ACSM-CEP, Exercise Physiologist;Destyn Schuyler Harvy, RN, BSN;Casey Claudene, RT;Randi Reeve BS, ACSM-CEP, Exercise Physiologist    Virtual Visit No    Medication changes reported     No    Fall or balance concerns reported    No    Tobacco Cessation No Change    Warm-up and Cool-down Performed as group-led instruction   orientation only   Resistance Training Performed Yes    VAD Patient? No    PAD/SET Patient? No      Pain Assessment   Currently in Pain? No/denies    Multiple Pain Sites No          Capillary Blood Glucose: No results found for this or any previous visit (from the past 24 hours).   Exercise Prescription Changes - 06/02/24 1500       Response to Exercise   Blood Pressure (Admit) 136/80    Blood Pressure (Exercise) 120/76    Blood Pressure (Exit) 110/66    Heart Rate (Admit) 63 bpm    Heart Rate (Exercise) 90 bpm    Heart Rate (Exit) 78 bpm    Oxygen Saturation (Admit) 94 %    Oxygen Saturation (Exercise) 94 %    Oxygen Saturation (Exit) 96 %    Rating of Perceived Exertion (Exercise) 13    Perceived Dyspnea (Exercise) 2    Duration Continue with 30 min of aerobic exercise without signs/symptoms of physical distress.    Intensity THRR unchanged      Progression   Progression Continue to progress workloads to maintain intensity without signs/symptoms of physical distress.      Resistance  Training   Training Prescription Yes    Weight blue bands    Reps 10-15    Time 10 Minutes      NuStep   Level 4    SPM 73    Minutes 15    METs 2.5      Track   Laps 22    Minutes 15    METs 3.3          Social History   Tobacco Use  Smoking Status Former   Current packs/day: 0.00   Average packs/day: 1 pack/day for 40.0 years (40.0 ttl pk-yrs)   Types: Cigarettes   Start date: 53   Quit date: 2017   Years since quitting: 8.5  Smokeless Tobacco Never    Goals Met:  Exercise tolerated well No report of concerns or symptoms today Strength training completed today  Goals Unmet:  Not Applicable  Comments: Service time is from 1316 to 1447    Dr. Slater Staff is Medical Director for Pulmonary Rehab at [facility].

## 2024-06-03 ENCOUNTER — Ambulatory Visit: Admitting: Physical Medicine and Rehabilitation

## 2024-06-03 NOTE — Progress Notes (Signed)
 Pulmonary Individual Treatment Plan  Patient Details  Name: JENIFFER CULLIVER MRN: [id number] Date of Birth: [birth year] Referring Provider:   Conrad Ports Pulmonary Rehab Walk Test from 04/03/2024 in [facility] for Heart, Vascular, & Lung Health  Referring Provider [name]    Initial Encounter Date:  Flowsheet Row Pulmonary Rehab Walk Test from 04/03/2024 in [facility] for Heart, Vascular, & Lung Health  Date 04/03/24    Visit Diagnosis: Centrilobular emphysema (HCC)  Patient's Home Medications on Admission:   Current Outpatient Medications:    albuterol  (PROVENTIL ) (2.5 MG/3ML) 0.083% nebulizer solution, Take 3 mLs (2.5 mg total) by nebulization 3 (three) times daily as needed for wheezing or shortness of breath., Disp: 75 mL, Rfl: 0   albuterol  (VENTOLIN  HFA) 108 (90 Base) MCG/ACT inhaler, Inhale 2 puffs into the lungs every 6 (six) hours as needed for wheezing or shortness of breath., Disp: 8 g, Rfl: 11   amLODipine  (NORVASC ) 5 MG tablet, Take 1 tablet (5 mg total) by mouth daily., Disp: 90 tablet, Rfl: 3   azelastine  (ASTELIN ) 0.1 % nasal spray, Place 2 sprays into both nostrils 2 (two) times daily. Use in each nostril as directed, Disp: , Rfl:    Blood Pressure Monitoring (BLOOD PRESSURE CUFF) MISC, Check blood pressure daily for elevated blood pressure, Disp: 1 each, Rfl: 0   budeson-glycopyrrolate-formoterol (BREZTRI  AEROSPHERE) 160-9-4.8 MCG/ACT AERO inhaler, Inhale 2 puffs into the lungs 2 (two) times daily., Disp: 10.7 g, Rfl: 11   cetirizine (ZYRTEC) 10 MG tablet, Take 10 mg by mouth daily., Disp: , Rfl:    dicyclomine  (BENTYL ) 20 MG tablet, Take 1 tablet (20 mg total) by mouth 2 (two) times daily., Disp: 20 tablet, Rfl: 0   FEROSUL 325 (65 Fe) MG tablet, Take 325 mg by mouth daily., Disp: , Rfl:    hydrOXYzine  (ATARAX ) 25 MG tablet, Take 25 mg by mouth every 6 (six) hours as needed (sleep)., Disp: , Rfl:    irbesartan  (AVAPRO ) 150  MG tablet, Take 1 tablet (150 mg total) by mouth daily., Disp: 30 tablet, Rfl: 3   levocetirizine (XYZAL ) 5 MG tablet, Take 1 tablet (5 mg total) by mouth every evening., Disp: 30 tablet, Rfl: 5   levothyroxine  (SYNTHROID ) 25 MCG tablet, Take 25 mcg by mouth daily before breakfast., Disp: , Rfl:    montelukast  (SINGULAIR ) 10 MG tablet, Take 1 tablet (10 mg total) by mouth at bedtime., Disp: 30 tablet, Rfl: 5   ondansetron  (ZOFRAN -ODT) 4 MG disintegrating tablet, Take 1 tablet (4 mg total) by mouth every 8 (eight) hours as needed for nausea or vomiting., Disp: 10 tablet, Rfl: 0   pantoprazole  (PROTONIX ) 40 MG tablet, Take 40 mg by mouth daily., Disp: , Rfl:    rosuvastatin  (CRESTOR ) 40 MG tablet, Take 1 tablet (40 mg total) by mouth daily., Disp: 90 tablet, Rfl: 3   spironolactone  (ALDACTONE ) 25 MG tablet, Take 1 tablet (25 mg total) by mouth daily., Disp: 90 tablet, Rfl: 3   tiZANidine (ZANAFLEX) 4 MG tablet, Take 4 mg by mouth every 8 (eight) hours as needed., Disp: , Rfl:    triamcinolone cream (KENALOG) 0.1 %, Apply 1 Application topically as needed., Disp: , Rfl:    valACYclovir  (VALTREX ) 1000 MG tablet, Take 1,000 mg by mouth daily. For 5 days, Disp: , Rfl:   Past Medical History: Past Medical History:  Diagnosis Date   CAD in native artery 12/11/2023   COPD (chronic obstructive pulmonary disease) (HCC)  GI bleed    Hyperlipidemia    Hypertension    Tubular adenoma of colon 2013    Tobacco Use: Social History   Tobacco Use  Smoking Status Former   Current packs/day: 0.00   Average packs/day: 1 pack/day for 40.0 years (40.0 ttl pk-yrs)   Types: Cigarettes   Start date: 42   Quit date: 2017   Years since quitting: 8.6  Smokeless Tobacco Never    Labs: Review Flowsheet       Latest Ref Rng & Units 02/26/2023 03/31/2023 04/01/2023  Labs for ITP Cardiac and Pulmonary Rehab  Cholestrol 100 - 199 mg/dL 798  - -  LDL (calc) 0 - 99 mg/dL 879  - -  HDL-C >60 mg/dL 60  - -   Trlycerides 0 - 149 mg/dL 878  - -  Bicarbonate 79.9 - 28.0 mmol/L - 19.8  22.5   TCO2 22 - 32 mmol/L - 21  24   Acid-base deficit 0.0 - 2.0 mmol/L - 3.0  3.0   O2 Saturation % - 77  69     Capillary Blood Glucose: Lab Results  Component Value Date   GLUCAP 101 (H) 03/14/2017   GLUCAP 117 (H) 01/17/2012     Pulmonary Assessment Scores:  Pulmonary Assessment Scores     Row Name 04/03/24 1346         ADL UCSD   ADL Phase Entry     SOB Score total 20       CAT Score   CAT Score 19       mMRC Score   mMRC Score 2       UCSD: Self-administered rating of dyspnea associated with activities of daily living (ADLs) 6-point scale (0 = not at all to 5 = maximal or unable to do because of breathlessness)  Scoring Scores range from 0 to 120.  Minimally important difference is 5 units  CAT: CAT can identify the health impairment of COPD patients and is better correlated with disease progression.  CAT has a scoring range of zero to 40. The CAT score is classified into four groups of low (less than 10), medium (10 - 20), high (21-30) and very high (31-40) based on the impact level of disease on health status. A CAT score over 10 suggests significant symptoms.  A worsening CAT score could be explained by an exacerbation, poor medication adherence, poor inhaler technique, or progression of COPD or comorbid conditions.  CAT MCID is 2 points  mMRC: mMRC (Modified Medical Research Council) Dyspnea Scale is used to assess the degree of baseline functional disability in patients of respiratory disease due to dyspnea. No minimal important difference is established. A decrease in score of 1 point or greater is considered a positive change.   Pulmonary Function Assessment:  Pulmonary Function Assessment - 04/03/24 1359       Breath   Bilateral Breath Sounds Decreased    Shortness of Breath Yes;Limiting activity          Exercise Target Goals: Exercise Program Goal: Individual  exercise prescription set using results from initial 6 min walk test and THRR while considering  patient's activity barriers and safety.   Exercise Prescription Goal: Initial exercise prescription builds to 30-45 minutes a day of aerobic activity, 2-3 days per week.  Home exercise guidelines will be given to patient during program as part of exercise prescription that the participant will acknowledge.  Activity Barriers & Risk Stratification:  Activity Barriers & Cardiac Risk Stratification -  04/03/24 1347       Activity Barriers & Cardiac Risk Stratification   Activity Barriers Deconditioning;Muscular Weakness;Shortness of Breath;Arthritis    Cardiac Risk Stratification High          6 Minute Walk:  6 Minute Walk     Row Name 04/03/24 1445         6 Minute Walk   Phase Initial     Distance 1200 feet     Walk Time 6 minutes     # of Rest Breaks 1  3:11-3:39 rest due to SOB and brief 87 RA     MPH 2.27     METS 3.64     RPE 13     Perceived Dyspnea  1     VO2 Peak 12.74     Symptoms Yes (comment)     Comments Short of breath     Resting HR 55 bpm     Resting BP 120/64     Resting Oxygen Saturation  98 %     Exercise Oxygen Saturation  during 6 min walk 89 %     Max Ex. HR 122 bpm     Max Ex. BP 178/82     2 Minute Post BP 162/78       Interval HR   1 Minute HR 64     2 Minute HR 62  inaccurate     3 Minute HR 121     4 Minute HR 68     5 Minute HR 70     6 Minute HR 122     2 Minute Post HR 73     Interval Heart Rate? Yes       Interval Oxygen   Interval Oxygen? Yes     Baseline Oxygen Saturation % 98 %     1 Minute Oxygen Saturation % 96 %     1 Minute Liters of Oxygen 0 L     2 Minute Oxygen Saturation % 93 %     2 Minute Liters of Oxygen 0 L     3 Minute Oxygen Saturation % 89 %     3 Minute Liters of Oxygen 0 L     4 Minute Oxygen Saturation % 96 %     4 Minute Liters of Oxygen 0 L     5 Minute Oxygen Saturation % 94 %     5 Minute Liters of Oxygen  0 L     6 Minute Oxygen Saturation % 99 %     6 Minute Liters of Oxygen 0 L     2 Minute Post Oxygen Saturation % 98 %     2 Minute Post Liters of Oxygen 0 L        Oxygen Initial Assessment:  Oxygen Initial Assessment - 04/03/24 1345       Home Oxygen   Home Oxygen Device None    Sleep Oxygen Prescription None    Home Exercise Oxygen Prescription None    Home Resting Oxygen Prescription None    Compliance with Home Oxygen Use Yes      Initial 6 min Walk   Oxygen Used None      Program Oxygen Prescription   Program Oxygen Prescription None      Intervention   Short Term Goals To learn and exhibit compliance with exercise, home and travel O2 prescription;To learn and understand importance of maintaining oxygen saturations>88%;To learn and demonstrate proper use of respiratory medications;To learn and  understand importance of monitoring SPO2 with pulse oximeter and demonstrate accurate use of the pulse oximeter.;To learn and demonstrate proper pursed lip breathing techniques or other breathing techniques. ;Other    Long  Term Goals Exhibits compliance with exercise, home  and travel O2 prescription;Verbalizes importance of monitoring SPO2 with pulse oximeter and return demonstration;Maintenance of O2 saturations>88%;Exhibits proper breathing techniques, such as pursed lip breathing or other method taught during program session;Compliance with respiratory medication;Demonstrates proper use of MDI's          Oxygen Re-Evaluation:  Oxygen Re-Evaluation     Row Name 04/28/24 0857 05/28/24 0835           Program Oxygen Prescription   Program Oxygen Prescription None None        Home Oxygen   Home Oxygen Device None None      Sleep Oxygen Prescription None None      Home Exercise Oxygen Prescription None None      Home Resting Oxygen Prescription None None      Compliance with Home Oxygen Use Yes Yes        Goals/Expected Outcomes   Short Term Goals To learn and exhibit  compliance with exercise, home and travel O2 prescription;To learn and understand importance of maintaining oxygen saturations>88%;To learn and demonstrate proper use of respiratory medications;To learn and understand importance of monitoring SPO2 with pulse oximeter and demonstrate accurate use of the pulse oximeter.;To learn and demonstrate proper pursed lip breathing techniques or other breathing techniques. ;Other To learn and exhibit compliance with exercise, home and travel O2 prescription;To learn and understand importance of maintaining oxygen saturations>88%;To learn and demonstrate proper use of respiratory medications;To learn and understand importance of monitoring SPO2 with pulse oximeter and demonstrate accurate use of the pulse oximeter.;To learn and demonstrate proper pursed lip breathing techniques or other breathing techniques. ;Other      Long  Term Goals Exhibits compliance with exercise, home  and travel O2 prescription;Verbalizes importance of monitoring SPO2 with pulse oximeter and return demonstration;Maintenance of O2 saturations>88%;Exhibits proper breathing techniques, such as pursed lip breathing or other method taught during program session;Compliance with respiratory medication;Demonstrates proper use of MDI's Exhibits compliance with exercise, home  and travel O2 prescription;Verbalizes importance of monitoring SPO2 with pulse oximeter and return demonstration;Maintenance of O2 saturations>88%;Exhibits proper breathing techniques, such as pursed lip breathing or other method taught during program session;Compliance with respiratory medication;Demonstrates proper use of MDI's      Goals/Expected Outcomes Compliance and understanding of oxygen saturation monitoring and breathing techniques to decrease shortness of breath. Compliance and understanding of oxygen saturation monitoring and breathing techniques to decrease shortness of breath.         Oxygen Discharge (Final Oxygen  Re-Evaluation):  Oxygen Re-Evaluation - 05/28/24 0835       Program Oxygen Prescription   Program Oxygen Prescription None      Home Oxygen   Home Oxygen Device None    Sleep Oxygen Prescription None    Home Exercise Oxygen Prescription None    Home Resting Oxygen Prescription None    Compliance with Home Oxygen Use Yes      Goals/Expected Outcomes   Short Term Goals To learn and exhibit compliance with exercise, home and travel O2 prescription;To learn and understand importance of maintaining oxygen saturations>88%;To learn and demonstrate proper use of respiratory medications;To learn and understand importance of monitoring SPO2 with pulse oximeter and demonstrate accurate use of the pulse oximeter.;To learn and demonstrate proper pursed lip breathing techniques  or other breathing techniques. ;Other    Long  Term Goals Exhibits compliance with exercise, home  and travel O2 prescription;Verbalizes importance of monitoring SPO2 with pulse oximeter and return demonstration;Maintenance of O2 saturations>88%;Exhibits proper breathing techniques, such as pursed lip breathing or other method taught during program session;Compliance with respiratory medication;Demonstrates proper use of MDI's    Goals/Expected Outcomes Compliance and understanding of oxygen saturation monitoring and breathing techniques to decrease shortness of breath.          Initial Exercise Prescription:  Initial Exercise Prescription - 04/03/24 1400       Date of Initial Exercise RX and Referring Provider   Date 04/03/24    Referring Provider [name]    Expected Discharge Date 06/30/24      NuStep   Level 2    SPM 70    Minutes 15    METs 2.3      Track   Laps 11    Minutes 15    METs 2.2      Prescription Details   Frequency (times per week) 2    Duration Progress to 30 minutes of continuous aerobic without signs/symptoms of physical distress      Intensity   THRR 40-80% of Max Heartrate 62-124     Ratings of Perceived Exertion 11-13    Perceived Dyspnea 0-4      Progression   Progression Continue to progress workloads to maintain intensity without signs/symptoms of physical distress.      Resistance Training   Training Prescription Yes    Weight blue bands    Reps 10-15          Perform Capillary Blood Glucose checks as needed.  Exercise Prescription Changes:   Exercise Prescription Changes     Row Name 04/21/24 1400 04/21/24 1500 05/05/24 1400 05/19/24 1500 06/02/24 1500     Response to Exercise   Blood Pressure (Admit) 142/70 142/70 128/72 152/60 136/80   Blood Pressure (Exercise) 142/76 142/76 140/80 150/68 120/76   Blood Pressure (Exit) -- 142/74 122/72 130/70 110/66   Heart Rate (Admit) 63 bpm 63 bpm 71 bpm 72 bpm 63 bpm   Heart Rate (Exercise) 99 bpm 77 bpm 85 bpm 84 bpm 90 bpm   Heart Rate (Exit) -- 79 bpm 84 bpm 75 bpm 78 bpm   Oxygen Saturation (Admit) 97 % 97 % 98 % 96 % 94 %   Oxygen Saturation (Exercise) 94 % 97 % 96 % 97 % 94 %   Oxygen Saturation (Exit) -- 99 % 97 % 98 % 96 %   Rating of Perceived Exertion (Exercise) 12 11 13 13 13    Perceived Dyspnea (Exercise) 1 1 3 2 2    Duration Continue with 30 min of aerobic exercise without signs/symptoms of physical distress. Continue with 30 min of aerobic exercise without signs/symptoms of physical distress. Continue with 30 min of aerobic exercise without signs/symptoms of physical distress. Continue with 30 min of aerobic exercise without signs/symptoms of physical distress. Continue with 30 min of aerobic exercise without signs/symptoms of physical distress.   Intensity THRR unchanged THRR unchanged THRR unchanged THRR unchanged THRR unchanged     Progression   Progression Continue to progress workloads to maintain intensity without signs/symptoms of physical distress. Continue to progress workloads to maintain intensity without signs/symptoms of physical distress. Continue to progress workloads to maintain  intensity without signs/symptoms of physical distress. Continue to progress workloads to maintain intensity without signs/symptoms of physical distress. Continue to progress workloads  to maintain intensity without signs/symptoms of physical distress.     Resistance Training   Training Prescription Yes Yes Yes Yes Yes   Weight blue bands blue bands blue bands blue bands blue bands   Reps 10-15 10-15 10-15 10-15 10-15   Time 10 Minutes 10 Minutes 10 Minutes 10 Minutes 10 Minutes     Interval Training   Interval Training No -- -- -- --     NuStep   Level 2 2 3 4 4    SPM 73 73 76 68 73   Minutes 15 15 15 15 15    METs 2.2 2.2 2.6 2.2 2.5     Track   Laps 18 18 21 8 22    Minutes 15 15 15 15 15    METs -- 3.25 3.62 1.8 3.3      Exercise Comments:   Exercise Comments     Row Name 04/09/24 1500           Exercise Comments Pt completed first day of group exercise. She exercised on the recumbent stepper for 15 min, level 2, METs 2.3. She then walked the track for 15 min, 3.62 METs. She can walk quickly on the track but previously did not tolerate the treadmill. She performed warm up and cool down with verbal and demonstrative cues. Discussed METs.          Exercise Goals and Review:   Exercise Goals     Row Name 04/03/24 1348             Exercise Goals   Increase Physical Activity Yes       Intervention Provide advice, education, support and counseling about physical activity/exercise needs.;Develop an individualized exercise prescription for aerobic and resistive training based on initial evaluation findings, risk stratification, comorbidities and participant's personal goals.       Expected Outcomes Short Term: Attend rehab on a regular basis to increase amount of physical activity.;Long Term: Add in home exercise to make exercise part of routine and to increase amount of physical activity.;Long Term: Exercising regularly at least 3-5 days a week.       Increase Strength and  Stamina Yes       Intervention Provide advice, education, support and counseling about physical activity/exercise needs.;Develop an individualized exercise prescription for aerobic and resistive training based on initial evaluation findings, risk stratification, comorbidities and participant's personal goals.       Expected Outcomes Short Term: Increase workloads from initial exercise prescription for resistance, speed, and METs.;Short Term: Perform resistance training exercises routinely during rehab and add in resistance training at home;Long Term: Improve cardiorespiratory fitness, muscular endurance and strength as measured by increased METs and functional capacity ( )       Able to understand and use rate of perceived exertion (RPE) scale Yes       Intervention Provide education and explanation on how to use RPE scale       Expected Outcomes Short Term: Able to use RPE daily in rehab to express subjective intensity level;Long Term:  Able to use RPE to guide intensity level when exercising independently       Able to understand and use Dyspnea scale Yes       Intervention Provide education and explanation on how to use Dyspnea scale       Expected Outcomes Short Term: Able to use Dyspnea scale daily in rehab to express subjective sense of shortness of breath during exertion;Long Term: Able to use Dyspnea scale to guide intensity  level when exercising independently       Knowledge and understanding of Target Heart Rate Range (THRR) Yes       Intervention Provide education and explanation of THRR including how the numbers were predicted and where they are located for reference       Expected Outcomes Short Term: Able to state/look up THRR;Long Term: Able to use THRR to govern intensity when exercising independently;Short Term: Able to use daily as guideline for intensity in rehab       Understanding of Exercise Prescription Yes       Intervention Provide education, explanation, and written  materials on patient's individual exercise prescription       Expected Outcomes Short Term: Able to explain program exercise prescription;Long Term: Able to explain home exercise prescription to exercise independently          Exercise Goals Re-Evaluation :  Exercise Goals Re-Evaluation     Row Name 04/28/24 0851 05/28/24 0833           Exercise Goal Re-Evaluation   Exercise Goals Review Increase Physical Activity;Able to understand and use Dyspnea scale;Understanding of Exercise Prescription;Increase Strength and Stamina;Knowledge and understanding of Target Heart Rate Range (THRR);Able to understand and use rate of perceived exertion (RPE) scale Increase Physical Activity;Able to understand and use Dyspnea scale;Understanding of Exercise Prescription;Increase Strength and Stamina;Knowledge and understanding of Target Heart Rate Range (THRR);Able to understand and use rate of perceived exertion (RPE) scale      Comments Pt has completed 4 exercise sessions, missing one for transportation. She is motivated. She is exercising on the recumbent stepper for 15 min, level 3, METs 2.4. She then is walking the track for 15 min, 3.62 METs. She can walk quickly on the track but has attempted the treadmill twice without success. She is eager to change equipment but will discuss importance of building endurance first. She performs warm up and cool down with demonstrative cues, including squats. She is using blue bands, 5.8 lbs. Pt has completed 13 exercise sessions. She loves the program. She is exercising on the recumbent stepper for 15 min, level 4, METs 2.6 when she works on speed. She then is walking the track for 15 min, 3.4 METs last session. She performs warm up and cool down with demonstrative cues, including squats. She is using blue bands, 5.8 lbs. Tolerating well.      Expected Outcomes Through exercise at rehab and at home, patient will increase physical capacity and ADL's will be easier to perform.  The patient will also feel comfortable establishing a home exercise program. Through exercise at rehab and at home, patient will increase physical capacity and ADL's will be easier to perform. The patient will also feel comfortable establishing a home exercise program.         Discharge Exercise Prescription (Final Exercise Prescription Changes):  Exercise Prescription Changes - 06/02/24 1500       Response to Exercise   Blood Pressure (Admit) 136/80    Blood Pressure (Exercise) 120/76    Blood Pressure (Exit) 110/66    Heart Rate (Admit) 63 bpm    Heart Rate (Exercise) 90 bpm    Heart Rate (Exit) 78 bpm    Oxygen Saturation (Admit) 94 %    Oxygen Saturation (Exercise) 94 %    Oxygen Saturation (Exit) 96 %    Rating of Perceived Exertion (Exercise) 13    Perceived Dyspnea (Exercise) 2    Duration Continue with 30 min of aerobic exercise without  signs/symptoms of physical distress.    Intensity THRR unchanged      Progression   Progression Continue to progress workloads to maintain intensity without signs/symptoms of physical distress.      Resistance Training   Training Prescription Yes    Weight blue bands    Reps 10-15    Time 10 Minutes      NuStep   Level 4    SPM 73    Minutes 15    METs 2.5      Track   Laps 22    Minutes 15    METs 3.3          Nutrition:  Target Goals: Understanding of nutrition guidelines, daily intake of sodium 1500mg , cholesterol 200mg , calories 30% from fat and 7% or less from saturated fats, daily to have 5 or more servings of fruits and vegetables.  Biometrics:  Pre Biometrics - 04/03/24 1454       Pre Biometrics   Grip Strength 38 kg   old dynometer          Nutrition Therapy Plan and Nutrition Goals:  Nutrition Therapy & Goals - 05/15/24 1620       Nutrition Therapy   Diet Heart Healthy Diet    Drug/Food Interactions Statins/Certain Fruits      Personal Nutrition Goals   Nutrition Goal Patient to improve diet  quality by using the plate method as a guide for meal planning to include lean protein/plant protein, fruits, vegetables, whole grains, nonfat dairy as part of a well-balanced diet.    Comments Goals in progress. Patient has medical history of HTN, hyperlipidemia, CAD, COPD. Lipids have improved WNL treated with crestor  40mg . A1c has improved from 6.5 to 6.0 and is managed with diet. She reports following  a regular diet and is minful of sodium intake and motivated to maintain weight. She was seen for follow-up for elevated blood pressures and in Irbesartan  was increased to 300mg  daily. She has maintained her weight since starting with our program. Patient will continue to benefit from participation in pulmonary rehab for nutrition ,exercise and lifestyle modification.      Intervention Plan   Intervention Prescribe, educate and counsel regarding individualized specific dietary modifications aiming towards targeted core components such as weight, hypertension, lipid management, diabetes, heart failure and other comorbidities.;Nutrition handout(s) given to patient.    Expected Outcomes Short Term Goal: Understand basic principles of dietary content, such as calories, fat, sodium, cholesterol and nutrients.;Long Term Goal: Adherence to prescribed nutrition plan.          Nutrition Assessments:  MEDIFICTS Score Key: >=70 Need to make dietary changes  40-70 Heart Healthy Diet <= 40 Therapeutic Level Cholesterol Diet   Picture Your Plate Scores: <59 Unhealthy dietary pattern with much room for improvement. 41-50 Dietary pattern unlikely to meet recommendations for good health and room for improvement. 51-60 More healthful dietary pattern, with some room for improvement.  >60 Healthy dietary pattern, although there may be some specific behaviors that could be improved.    Nutrition Goals Re-Evaluation:  Nutrition Goals Re-Evaluation     Row Name 04/09/24 1343 05/15/24 1620           Goals    Current Weight 147 lb 14.9 oz (67.1 kg) 149 lb 11.1 oz (67.9 kg)      Comment A1c 6.0, Lipids WNL -LDL85, HDL 42 A1c 6.0, LDL 69, HDL 51      Expected Outcome atient has medical history of HTN,  hyperlipidemia, CAD, COPD. Lipids have improved WNL treated with crestor  40mg . A1c has improved from 6.5 to 6.0 and is managed with diet. She reports following a regular diet and motivation to maintain weight. Patient will continue to benefit from participation in pulmonary rehab for nutrition ,exercise and lifestyle modification. Goals in progress. Patient has medical history of HTN, hyperlipidemia, CAD, COPD. Lipids have improved WNL treated with crestor  40mg . A1c has improved from 6.5 to 6.0 and is managed with diet. She reports following a regular diet and is minful of sodium intake and motivated to maintain weight. She was seen for follow-up for elevated blood pressures and in Irbesartan  was increased to 300mg  daily. She has maintained her weight since starting with our program. Patient will continue to benefit from participation in pulmonary rehab for nutrition ,exercise and lifestyle modification.         Nutrition Goals Discharge (Final Nutrition Goals Re-Evaluation):  Nutrition Goals Re-Evaluation - 05/15/24 1620       Goals   Current Weight 149 lb 11.1 oz (67.9 kg)    Comment A1c 6.0, LDL 69, HDL 51    Expected Outcome Goals in progress. Patient has medical history of HTN, hyperlipidemia, CAD, COPD. Lipids have improved WNL treated with crestor  40mg . A1c has improved from 6.5 to 6.0 and is managed with diet. She reports following a regular diet and is minful of sodium intake and motivated to maintain weight. She was seen for follow-up for elevated blood pressures and in Irbesartan  was increased to 300mg  daily. She has maintained her weight since starting with our program. Patient will continue to benefit from participation in pulmonary rehab for nutrition ,exercise and lifestyle modification.           Psychosocial: Target Goals: Acknowledge presence or absence of significant depression and/or stress, maximize coping skills, provide positive support system. Participant is able to verbalize types and ability to use techniques and skills needed for reducing stress and depression.  Initial Review & Psychosocial Screening:  Initial Psych Review & Screening - 04/03/24 1318       Initial Review   Current issues with Current Sleep Concerns   can't fall asleep until 7 am, sleeps 2 hrs.     Family Dynamics   Good Support System? Yes    Comments son Caron      Barriers   Psychosocial barriers to participate in program The patient should benefit from training in stress management and relaxation.      Screening Interventions   Interventions Encouraged to exercise    Expected Outcomes Short Term goal: Utilizing psychosocial counselor, staff and physician to assist with identification of specific Stressors or current issues interfering with healing process. Setting desired goal for each stressor or current issue identified.;Long Term Goal: Stressors or current issues are controlled or eliminated.;Short Term goal: Identification and review with participant of any Quality of Life or Depression concerns found by scoring the questionnaire.;Long Term goal: The participant improves quality of Life and PHQ9 Scores as seen by post scores and/or verbalization of changes          Quality of Life Scores:  Scores of 19 and below usually indicate a poorer quality of life in these areas.  A difference of  2-3 points is a clinically meaningful difference.  A difference of 2-3 points in the total score of the Quality of Life Index has been associated with significant improvement in overall quality of life, self-image, physical symptoms, and general health in studies assessing change  in quality of life.  PHQ-9: Review Flowsheet       04/03/2024  Depression screen PHQ 2/9  Decreased Interest 2  Down,  Depressed, Hopeless 0  PHQ - 2 Score 2  Altered sleeping 3  Tired, decreased energy 1  Change in appetite 0  Feeling bad or failure about yourself  0  Trouble concentrating 0  Moving slowly or fidgety/restless 0  Suicidal thoughts 0  PHQ-9 Score 6  Difficult doing work/chores Not difficult at all   Interpretation of Total Score  Total Score Depression Severity:  1-4 = Minimal depression, 5-9 = Mild depression, 10-14 = Moderate depression, 15-19 = Moderately severe depression, 20-27 = Severe depression   Psychosocial Evaluation and Intervention:  Psychosocial Evaluation - 04/03/24 1341       Psychosocial Evaluation & Interventions   Interventions Encouraged to exercise with the program and follow exercise prescription;Stress management education    Expected Outcomes To participate in PR    Continue Psychosocial Services  Follow up required by staff          Psychosocial Re-Evaluation:  Psychosocial Re-Evaluation     Row Name 04/06/24 0908 05/04/24 1224 05/29/24 1509         Psychosocial Re-Evaluation   Current issues with Current Sleep Concerns Current Sleep Concerns Current Sleep Concerns     Comments No changes since orientation. Otto is scheduled to start the program on 6/12 Monthly psychosocial re-evaluation is as follows: Berniece has attended 6 sessions so far. She continues to express current sleep concerns but hasn't spoke to her MD for any possible interventions. She denies any other psychosocial barriers or concerns. We have assisted her in getting transportation set up. We will continue to monitor and will assess her needs. Monthly psychosocial re-evaluation is as follows: Ieasha has attended 14 sessions so far and loves coming to class. She is motivated to exercise and excited to socialize with her newly made friends. Josepha though, continues to express current sleep concerns. She spoke to her MD who recommended a sleep routine, but Gustava hasn't implemented it yet. She  denies any other psychosocial barriers or concerns. We will continue to monitor and will assess her needs.     Expected Outcomes For Carmelita to get quality sleep, to feel well rested. To exercise in Pulm Rehab without any other barriers or concerns For Mazal to get quality sleep, to feel well rested. To exercise in Pulm Rehab without any other barriers or concerns For Johnesha to get quality sleep, to feel well rested. To exercise in Pulm Rehab without any other barriers or concerns     Interventions Encouraged to attend Pulmonary Rehabilitation for the exercise Encouraged to attend Pulmonary Rehabilitation for the exercise Encouraged to attend Pulmonary Rehabilitation for the exercise     Continue Psychosocial Services  Follow up required by staff Follow up required by staff Follow up required by staff        Psychosocial Discharge (Final Psychosocial Re-Evaluation):  Psychosocial Re-Evaluation - 05/29/24 1509       Psychosocial Re-Evaluation   Current issues with Current Sleep Concerns    Comments Monthly psychosocial re-evaluation is as follows: Nylah has attended 14 sessions so far and loves coming to class. She is motivated to exercise and excited to socialize with her newly made friends. Alaira though, continues to express current sleep concerns. She spoke to her MD who recommended a sleep routine, but Blakelynn hasn't implemented it yet. She denies any other psychosocial barriers or concerns. We  will continue to monitor and will assess her needs.    Expected Outcomes For Keyanah to get quality sleep, to feel well rested. To exercise in Pulm Rehab without any other barriers or concerns    Interventions Encouraged to attend Pulmonary Rehabilitation for the exercise    Continue Psychosocial Services  Follow up required by staff          Education: Education Goals: Education classes will be provided on a weekly basis, covering required topics. Participant will state understanding/return demonstration  of topics presented.  Learning Barriers/Preferences:  Learning Barriers/Preferences - 04/03/24 1342       Learning Barriers/Preferences   Learning Barriers Sight;Reading    Learning Preferences Skilled Demonstration;Individual Instruction          Education Topics: Know Your Numbers Group instruction that is supported by a PowerPoint presentation. Instructor discusses importance of knowing and understanding resting, exercise, and post-exercise oxygen saturation, heart rate, and blood pressure. Oxygen saturation, heart rate, blood pressure, rating of perceived exertion, and dyspnea are reviewed along with a normal range for these values.  Flowsheet Row PULMONARY REHAB OTHER RESPIRATORY from 04/23/2024 in [facility] for Heart, Vascular, & Lung Health  Date 04/23/24  Educator EP  Instruction Review Code 1- Verbalizes Understanding    Exercise for the Pulmonary Patient Group instruction that is supported by a PowerPoint presentation. Instructor discusses benefits of exercise, core components of exercise, frequency, duration, and intensity of an exercise routine, importance of utilizing pulse oximetry during exercise, safety while exercising, and options of places to exercise outside of rehab.  Flowsheet Row PULMONARY REHAB OTHER RESPIRATORY from 04/16/2024 in [facility] for Heart, Vascular, & Lung Health  Date 04/16/24  Educator EP  Instruction Review Code 1- Verbalizes Understanding    MET Level  Group instruction provided by PowerPoint, verbal discussion, and written material to support subject matter. Instructor reviews what METs are and how to increase METs.    Pulmonary Medications Verbally interactive group education provided by instructor with focus on inhaled medications and proper administration. Flowsheet Row PULMONARY REHAB OTHER RESPIRATORY from 04/09/2024 in [facility] for Heart, Vascular, & Lung  Health  Date 04/09/24  Educator RT  Instruction Review Code 1- Verbalizes Understanding    Anatomy and Physiology of the Respiratory System Group instruction provided by PowerPoint, verbal discussion, and written material to support subject matter. Instructor reviews respiratory cycle and anatomical components of the respiratory system and their functions. Instructor also reviews differences in obstructive and restrictive respiratory diseases with examples of each.    Oxygen Safety Group instruction provided by PowerPoint, verbal discussion, and written material to support subject matter. There is an overview of "What is Oxygen" and "Why do we need it".  Instructor also reviews how to create a safe environment for oxygen use, the importance of using oxygen as prescribed, and the risks of noncompliance. There is a brief discussion on traveling with oxygen and resources the patient may utilize. Flowsheet Row PULMONARY REHAB OTHER RESPIRATORY from 04/30/2024 in [facility] for Heart, Vascular, & Lung Health  Date 04/30/24  Educator RN  Instruction Review Code 1- Verbalizes Understanding    Oxygen Use Group instruction provided by PowerPoint, verbal discussion, and written material to discuss how supplemental oxygen is prescribed and different types of oxygen supply systems. Resources for more information are provided.    Breathing Techniques Group instruction that is supported by demonstration and informational handouts. Instructor discusses  the benefits of pursed lip and diaphragmatic breathing and detailed demonstration on how to perform both.  Flowsheet Row PULMONARY REHAB OTHER RESPIRATORY from 05/14/2024 in [facility] for Heart, Vascular, & Lung Health  Date 05/14/24  Educator RN  Instruction Review Code 1- Verbalizes Understanding     Risk Factor Reduction Group instruction that is supported by a PowerPoint presentation. Instructor  discusses the definition of a risk factor, different risk factors for pulmonary disease, and how the heart and lungs work together.   Pulmonary Diseases Group instruction provided by PowerPoint, verbal discussion, and written material to support subject matter. Instructor gives an overview of the different type of pulmonary diseases. There is also a discussion on risk factors and symptoms as well as ways to manage the diseases.   Stress and Energy Conservation Group instruction provided by PowerPoint, verbal discussion, and written material to support subject matter. Instructor gives an overview of stress and the impact it can have on the body. Instructor also reviews ways to reduce stress. There is also a discussion on energy conservation and ways to conserve energy throughout the day. Flowsheet Row PULMONARY REHAB OTHER RESPIRATORY from 05/21/2024 in [facility] for Heart, Vascular, & Lung Health  Date 05/21/24  Educator RN  Instruction Review Code 1- Verbalizes Understanding    Warning Signs and Symptoms Group instruction provided by PowerPoint, verbal discussion, and written material to support subject matter. Instructor reviews warning signs and symptoms of stroke, heart attack, cold and flu. Instructor also reviews ways to prevent the spread of infection. Flowsheet Row PULMONARY REHAB OTHER RESPIRATORY from 05/28/2024 in [facility] for Heart, Vascular, & Lung Health  Date 05/28/24  Educator RN  Instruction Review Code 1- Verbalizes Understanding    Other Education Group or individual verbal, written, or video instructions that support the educational goals of the pulmonary rehab program.    Knowledge Questionnaire Score:  Knowledge Questionnaire Score - 04/03/24 1343       Knowledge Questionnaire Score   Pre Score 14/18          Core Components/Risk Factors/Patient Goals at Admission:  Personal Goals and Risk Factors at  Admission - 04/03/24 1343       Core Components/Risk Factors/Patient Goals on Admission   Improve shortness of breath with ADL's Yes    Intervention Provide education, individualized exercise plan and daily activity instruction to help decrease symptoms of SOB with activities of daily living.    Expected Outcomes Short Term: Improve cardiorespiratory fitness to achieve a reduction of symptoms when performing ADLs;Long Term: Be able to perform more ADLs without symptoms or delay the onset of symptoms          Core Components/Risk Factors/Patient Goals Review:   Goals and Risk Factor Review     Row Name 04/06/24 0910 05/04/24 1230 05/29/24 1511         Core Components/Risk Factors/Patient Goals Review   Personal Goals Review Improve shortness of breath with ADL's;Develop more efficient breathing techniques such as purse lipped breathing and diaphragmatic breathing and practicing self-pacing with activity. Improve shortness of breath with ADL's;Develop more efficient breathing techniques such as purse lipped breathing and diaphragmatic breathing and practicing self-pacing with activity. Improve shortness of breath with ADL's     Review Unable to assess goals yet. Samanvi has not started the program. She is scheduled to start on 6/12 Monthly review of patient's Core Components/Risk Factors/Patient Goals are as follows: Goal in progress  for improving her shortness of breath with ADLs. Peggyann has been able to maintain her oxygen saturation on room air with exertion. Goal met on developing more efficient breathing techniques such as purse lipped breathing and diaphragmatic breathing; and practicing self-pacing with activity. She can initiate pursed lip breathing and is practicing diaphragmatic breathing before bed. She can self-pace herself when needed and allows herself to take breaks while walking or exercising. Vayda will continue to benefit from PR for nutrition, education, exercise, and lifestyle  modification. Monthly review of patient's Core Components/Risk Factors/Patient Goals are as follows: Goal in progress for improving her shortness of breath with ADLs. Yulieth has been able to maintain her oxygen saturation on room air with exertion. She is increasing her METs and workload on the NuStep and walking the track. Vanassa will continue to benefit from PR for nutrition, education, exercise, and lifestyle modification.     Expected Outcomes For Annai to improve shortness of breath with ADL's and develop more efficient breathing techniques such as purse lipped breathing and diaphragmatic breathing; and practicing self-pacing with activity and improve shortness of breath with ADL's Pt will show progress toward meeting expected goals and outcomes. Pt will show progress toward meeting expected goals and outcomes.        Core Components/Risk Factors/Patient Goals at Discharge (Final Review):   Goals and Risk Factor Review - 05/29/24 1511       Core Components/Risk Factors/Patient Goals Review   Personal Goals Review Improve shortness of breath with ADL's    Review Monthly review of patient's Core Components/Risk Factors/Patient Goals are as follows: Goal in progress for improving her shortness of breath with ADLs. Sevana has been able to maintain her oxygen saturation on room air with exertion. She is increasing her METs and workload on the NuStep and walking the track. Lashuna will continue to benefit from PR for nutrition, education, exercise, and lifestyle modification.    Expected Outcomes Pt will show progress toward meeting expected goals and outcomes.          ITP Comments: Pt is making expected progress toward Pulmonary Rehab goals after completing 15 session(s). Recommend continued exercise, life style modification, education, and utilization of breathing techniques to increase stamina and strength, while also decreasing shortness of breath with exertion.     Comments: Dr. Slater Staff  is Medical Director for Pulmonary Rehab at [facility].

## 2024-06-04 ENCOUNTER — Encounter (HOSPITAL_COMMUNITY)

## 2024-06-04 ENCOUNTER — Encounter (HOSPITAL_BASED_OUTPATIENT_CLINIC_OR_DEPARTMENT_OTHER): Admitting: Family

## 2024-06-04 ENCOUNTER — Ambulatory Visit (HOSPITAL_COMMUNITY)

## 2024-06-04 ENCOUNTER — Encounter (HOSPITAL_COMMUNITY)
Admission: RE | Admit: 2024-06-04 | Discharge: 2024-06-04 | Disposition: A | Discharge status: Home / Self Care | Source: Ambulatory Visit | Attending: Pulmonary Disease | Admitting: Pulmonary Disease

## 2024-06-04 DIAGNOSIS — J432 Centrilobular emphysema: Secondary | ICD-10-CM | POA: Diagnosis not present

## 2024-06-04 NOTE — Progress Notes (Signed)
 Daily Session Note  Patient Details  Name: Mariah Foster MRN: [id number] Date of Birth: 1961-01-31 Referring Provider:   Conrad Ports Pulmonary Rehab Walk Test from 04/03/2024 in [facility] for Heart, Vascular, & Lung Health  Referring Provider [name]    Encounter Date: 06/04/2024  Check In:  Session Check In - 06/04/24 1346       Check-In   Supervising physician immediately available to respond to emergencies [facility] MD immediately available    Physician(s) Orren Fabry, PA    Location [facility]    Staff Present Johnnie Moats, MS, ACSM-CEP, Exercise Physiologist;Jamaurie Bernier Harvy, RN, BSN;Casey Claudene, RT;Randi Midge BS, ACSM-CEP, Exercise Physiologist;Bailey Elnor, MS, Exercise Physiologist    Virtual Visit No    Medication changes reported     No    Fall or balance concerns reported    No    Tobacco Cessation No Change    Warm-up and Cool-down Performed as group-led instruction   orientation only   Resistance Training Performed Yes    VAD Patient? No    PAD/SET Patient? No      Pain Assessment   Currently in Pain? No/denies    Multiple Pain Sites No          Capillary Blood Glucose: No results found for this or any previous visit (from the past 24 hours).    Social History   Tobacco Use  Smoking Status Former   Current packs/day: 0.00   Average packs/day: 1 pack/day for 40.0 years (40.0 ttl pk-yrs)   Types: Cigarettes   Start date: 53   Quit date: 2017   Years since quitting: 8.6  Smokeless Tobacco Never    Goals Met:  Exercise tolerated well Queuing for purse lip breathing No report of concerns or symptoms today Strength training completed today  Goals Unmet:  Not Applicable  Comments: Service time is from 1326 to 1445    Dr. Slater Staff is Medical Director for Pulmonary Rehab at [facility].

## 2024-06-08 ENCOUNTER — Telehealth: Payer: Self-pay

## 2024-06-08 ENCOUNTER — Other Ambulatory Visit (HOSPITAL_COMMUNITY): Payer: Self-pay

## 2024-06-08 NOTE — Telephone Encounter (Signed)
*  Pulm  Pharmacy Patient Advocate Encounter   Received notification from CoverMyMeds that prior authorization for Breztri  Aerosphere 160-9-4.8MCG/ACT aerosol  is required/requested.   Insurance verification completed.   The patient is insured through [facility] .   Per test claim: Refill too soon. PA is not needed at this time. Medication was filled 08/11. Next eligible fill date is 09/03.

## 2024-06-09 ENCOUNTER — Ambulatory Visit (HOSPITAL_COMMUNITY)

## 2024-06-09 ENCOUNTER — Encounter (HOSPITAL_COMMUNITY)
Admission: RE | Admit: 2024-06-09 | Discharge: 2024-06-09 | Disposition: A | Discharge status: Home / Self Care | Source: Ambulatory Visit | Attending: Pulmonary Disease | Admitting: Pulmonary Disease

## 2024-06-09 DIAGNOSIS — J432 Centrilobular emphysema: Secondary | ICD-10-CM | POA: Diagnosis not present

## 2024-06-09 NOTE — Progress Notes (Signed)
 Daily Session Note  Patient Details  Name: Mariah Foster MRN: [id number] Date of Birth: 1961/07/25 Referring Provider:   Conrad Ports Pulmonary Rehab Walk Test from 04/03/2024 in [facility] for Heart, Vascular, & Lung Health  Referring Provider [name]    Encounter Date: 06/09/2024  Check In:  Session Check In - 06/09/24 1454       Check-In   Supervising physician immediately available to respond to emergencies [facility] MD immediately available    Physician(s) Rosabel Mose, NP    Location [facility]    Staff Present Johnnie Moats, MS, ACSM-CEP, Exercise Physiologist;[place]ford Prevo Claudene Grayland Gal, MS, ACSM-CEP, Exercise Physiologist;Carlette Bernett, RN, BSN    Virtual Visit No    Medication changes reported     No    Fall or balance concerns reported    No    Tobacco Cessation No Change    Warm-up and Cool-down Performed as group-led instruction   orientation only   Resistance Training Performed Yes    VAD Patient? No    PAD/SET Patient? No      Pain Assessment   Currently in Pain? No/denies    Multiple Pain Sites No          Capillary Blood Glucose: No results found for this or any previous visit (from the past 24 hours).    Social History   Tobacco Use  Smoking Status Former   Current packs/day: 0.00   Average packs/day: 1 pack/day for 40.0 years (40.0 ttl pk-yrs)   Types: Cigarettes   Start date: 4   Quit date: 2017   Years since quitting: 8.6  Smokeless Tobacco Never    Goals Met:  Proper associated with RPD/PD & O2 Sat Independence with exercise equipment Exercise tolerated well No report of concerns or symptoms today Strength training completed today  Goals Unmet:  Not Applicable  Comments: Service time is from 1315 to 1437.    Dr. Slater Staff is Medical Director for Pulmonary Rehab at [facility].

## 2024-06-11 ENCOUNTER — Encounter (HOSPITAL_COMMUNITY)
Admission: RE | Admit: 2024-06-11 | Discharge: 2024-06-11 | Disposition: A | Discharge status: Home / Self Care | Source: Ambulatory Visit | Attending: Pulmonary Disease | Admitting: Pulmonary Disease

## 2024-06-11 ENCOUNTER — Ambulatory Visit (HOSPITAL_COMMUNITY)

## 2024-06-11 ENCOUNTER — Telehealth (HOSPITAL_COMMUNITY): Payer: Self-pay

## 2024-06-11 NOTE — Telephone Encounter (Signed)
 Patient left message calling out for 1:15 PR class.

## 2024-06-15 ENCOUNTER — Other Ambulatory Visit: Payer: Self-pay

## 2024-06-15 ENCOUNTER — Emergency Department (HOSPITAL_COMMUNITY)

## 2024-06-15 ENCOUNTER — Emergency Department (HOSPITAL_COMMUNITY)
Admission: EM | Admit: 2024-06-15 | Discharge: 2024-06-15 | Disposition: A | Discharge status: Home / Self Care | Source: Ambulatory Visit | Attending: Emergency Medicine | Admitting: Emergency Medicine

## 2024-06-15 ENCOUNTER — Encounter (HOSPITAL_COMMUNITY): Payer: Self-pay

## 2024-06-15 DIAGNOSIS — Z79899 Other long term (current) drug therapy: Secondary | ICD-10-CM | POA: Insufficient documentation

## 2024-06-15 DIAGNOSIS — J449 Chronic obstructive pulmonary disease, unspecified: Secondary | ICD-10-CM | POA: Insufficient documentation

## 2024-06-15 DIAGNOSIS — I10 Essential (primary) hypertension: Secondary | ICD-10-CM | POA: Insufficient documentation

## 2024-06-15 DIAGNOSIS — R1011 Right upper quadrant pain: Secondary | ICD-10-CM | POA: Diagnosis present

## 2024-06-15 DIAGNOSIS — I2510 Atherosclerotic heart disease of native coronary artery without angina pectoris: Secondary | ICD-10-CM | POA: Diagnosis not present

## 2024-06-15 LAB — URINALYSIS, ROUTINE W REFLEX MICROSCOPIC
Bilirubin Urine: NEGATIVE
Glucose, UA: NEGATIVE mg/dL
Hgb urine dipstick: NEGATIVE
Ketones, ur: NEGATIVE mg/dL
Leukocytes,Ua: NEGATIVE
Nitrite: NEGATIVE
Protein, ur: NEGATIVE mg/dL
Specific Gravity, Urine: 1.021 (ref 1.005–1.030)
pH: 5 (ref 5.0–8.0)

## 2024-06-15 LAB — COMPREHENSIVE METABOLIC PANEL WITH GFR
ALT: 20 U/L (ref 0–44)
AST: 24 U/L (ref 15–41)
Albumin: 4.3 g/dL (ref 3.5–5.0)
Alkaline Phosphatase: 75 U/L (ref 38–126)
Anion gap: 12 (ref 5–15)
BUN: 21 mg/dL (ref 8–23)
CO2: 26 mmol/L (ref 22–32)
Calcium: 9 mg/dL (ref 8.9–10.3)
Chloride: 102 mmol/L (ref 98–111)
Creatinine, Ser: 1.79 mg/dL — ABNORMAL HIGH (ref 0.44–1.00)
GFR, Estimated: 31 mL/min — ABNORMAL LOW (ref >60)
Glucose, Bld: 105 mg/dL — ABNORMAL HIGH (ref 70–99)
Potassium: 3.8 mmol/L (ref 3.5–5.1)
Sodium: 140 mmol/L (ref 135–145)
Total Bilirubin: 1.3 mg/dL — ABNORMAL HIGH (ref 0.0–1.2)
Total Protein: 7.4 g/dL (ref 6.5–8.1)

## 2024-06-15 LAB — CBC
HCT: 41.6 % (ref 36.0–46.0)
Hemoglobin: 13.3 g/dL (ref 12.0–15.0)
MCH: 30.5 pg (ref 26.0–34.0)
MCHC: 32 g/dL (ref 30.0–36.0)
MCV: 95.4 fL (ref 80.0–100.0)
Platelets: 362 K/uL (ref 150–400)
RBC: 4.36 MIL/uL (ref 3.87–5.11)
RDW: 13.7 % (ref 11.5–15.5)
WBC: 7 K/uL (ref 4.0–10.5)
nRBC: 0 % (ref 0.0–0.2)

## 2024-06-15 LAB — LIPASE, BLOOD: Lipase: 23 U/L (ref 11–51)

## 2024-06-15 MED ORDER — ONDANSETRON HCL 4 MG/2ML IJ SOLN
4.0000 mg | Freq: Once | INTRAMUSCULAR | Status: AC
  Administered 2024-06-15: 4 mg via INTRAVENOUS
  Filled 2024-06-15: qty 2

## 2024-06-15 MED ORDER — MORPHINE SULFATE (PF) 4 MG/ML IV SOLN
4.0000 mg | Freq: Once | INTRAVENOUS | Status: AC
  Administered 2024-06-15: 4 mg via INTRAVENOUS
  Filled 2024-06-15: qty 1

## 2024-06-15 NOTE — ED Notes (Signed)
 Patient transported to X-ray

## 2024-06-15 NOTE — ED Provider Notes (Signed)
 [place] EMERGENCY DEPARTMENT AT [facility] Provider Note   CSN: [id number] Arrival date & time: 06/15/24  1530     Patient presents with: Abdominal Pain   Mariah Foster is a 63 y.o. female.    Abdominal Pain    Patient has a history of hypertension hyperlipidemia COPD coronary artery disease.  Patient presents ED with complaints of pain in her right upper abdomen.  Patient states she has had the symptoms for a long time.  It will come and go.  Patient states right now is a very sharp pain that moves towards her back.  Increases when she turns certain ways and presses on that area.  She says she has had some episodes of nausea and vomiting.  Patient states they have not figured out what caused her symptoms.  She would like for me to figure it out and take it out.  She denies any fevers.  No coughing no chest pain.  Prior to Admission medications   Medication Sig Start Date End Date Taking? Authorizing Provider  albuterol  (PROVENTIL ) (2.5 MG/3ML) 0.083% nebulizer solution Take 3 mLs (2.5 mg total) by nebulization 3 (three) times daily as needed for wheezing or shortness of breath. 08/29/23   Jeneal Danita Macintosh, MD  albuterol  (VENTOLIN  HFA) 108 [phone] Base) MCG/ACT inhaler Inhale 2 puffs into the lungs every 6 (six) hours as needed for wheezing or shortness of breath. 02/04/24   Kara Dorn NOVAK, MD  amLODipine  (NORVASC ) 5 MG tablet Take 1 tablet (5 mg total) by mouth daily. 01/22/24   Raford Riggs, MD  azelastine  (ASTELIN ) 0.1 % nasal spray Place 2 sprays into both nostrils 2 (two) times daily. Use in each nostril as directed    [provider]  Blood Pressure Monitoring (BLOOD PRESSURE CUFF) MISC Check blood pressure daily for elevated blood pressure 01/22/24   Raford Riggs, MD  budeson-glycopyrrolate-formoterol (BREZTRI  AEROSPHERE) 160-9-4.8 MCG/ACT AERO inhaler Inhale 2 puffs into the lungs 2 (two) times daily. 02/04/24   Kara Dorn NOVAK, MD   cetirizine (ZYRTEC) 10 MG tablet Take 10 mg by mouth daily. 11/09/23   [provider]  dicyclomine  (BENTYL ) 20 MG tablet Take 1 tablet (20 mg total) by mouth 2 (two) times daily. 12/01/23   Henderly, Britni A, PA-C  FEROSUL 325 (65 Fe) MG tablet Take 325 mg by mouth daily. 11/09/23   [provider]  hydrOXYzine  (ATARAX ) 25 MG tablet Take 25 mg by mouth every 6 (six) hours as needed (sleep). 11/26/23   [provider]  irbesartan  (AVAPRO ) 150 MG tablet Take 1 tablet (150 mg total) by mouth daily. 01/22/24   Raford Riggs, MD  levocetirizine (XYZAL ) 5 MG tablet Take 1 tablet (5 mg total) by mouth every evening. 12/05/23   Jeneal Danita Macintosh, MD  levothyroxine  (SYNTHROID ) 25 MCG tablet Take 25 mcg by mouth daily before breakfast. 10/07/23   [provider]  montelukast  (SINGULAIR ) 10 MG tablet Take 1 tablet (10 mg total) by mouth at bedtime. 12/05/23   Jeneal Danita Macintosh, MD  ondansetron  (ZOFRAN -ODT) 4 MG disintegrating tablet Take 1 tablet (4 mg total) by mouth every 8 (eight) hours as needed for nausea or vomiting. 11/23/23   Keith Sor, PA-C  pantoprazole  (PROTONIX ) 40 MG tablet Take 40 mg by mouth daily. 12/06/23 12/05/24  [provider]  rosuvastatin  (CRESTOR ) 40 MG tablet Take 1 tablet (40 mg total) by mouth daily. 01/22/24 05/25/24  Raford Riggs, MD  spironolactone  (ALDACTONE ) 25 MG tablet Take 1  tablet (25 mg total) by mouth daily. 01/22/24 05/25/24  Raford Riggs, MD  tiZANidine (ZANAFLEX) 4 MG tablet Take 4 mg by mouth every 8 (eight) hours as needed. 10/04/23   [provider]  triamcinolone cream (KENALOG) 0.1 % Apply 1 Application topically as needed.    [provider]  valACYclovir  (VALTREX ) 1000 MG tablet Take 1,000 mg by mouth daily. For 5 days    [provider]  potassium chloride  (K-DUR) 10 MEQ tablet Take 1 tablet (10 mEq total) by mouth daily. 03/14/17 05/26/19  Layden, Lindsey A, PA-C     Allergies: Flagyl [metronidazole] and Metronidazole hcl    Review of Systems  Gastrointestinal:  Positive for abdominal pain.    Updated Vital Signs BP 136/81   Pulse 76   Temp 97.6 F (36.4 C) (Oral)   Resp (!) 24   Ht 1.626 m (5' 4)   Wt 63.5 kg   SpO2 96%   BMI 24.03 kg/m   Physical Exam Vitals and nursing note reviewed.  Constitutional:      Appearance: She is well-developed.  HENT:     Head: Normocephalic and atraumatic.     Right Ear: External ear normal.     Left Ear: External ear normal.  Eyes:     General: No scleral icterus.       Right eye: No discharge.        Left eye: No discharge.     Conjunctiva/sclera: Conjunctivae normal.  Neck:     Trachea: No tracheal deviation.  Cardiovascular:     Rate and Rhythm: Normal rate and regular rhythm.  Pulmonary:     Effort: Pulmonary effort is normal. No respiratory distress.     Breath sounds: Normal breath sounds. No stridor. No wheezing or rales.  Abdominal:     General: Bowel sounds are normal. There is no distension.     Palpations: Abdomen is soft.     Tenderness: There is abdominal tenderness in the right upper quadrant. There is guarding. There is no rebound.  Musculoskeletal:        General: No tenderness or deformity.     Cervical back: Neck supple.  Skin:    General: Skin is warm and dry.     Findings: No rash.  Neurological:     General: No focal deficit present.     Mental Status: She is alert.     Cranial Nerves: No cranial nerve deficit, dysarthria or facial asymmetry.     Sensory: No sensory deficit.     Motor: No abnormal muscle tone or seizure activity.     Coordination: Coordination normal.  Psychiatric:        Mood and Affect: Mood normal.     (all labs ordered are listed, but only abnormal results are displayed) Labs Reviewed  COMPREHENSIVE METABOLIC PANEL WITH GFR - Abnormal; Notable for the following components:      Result Value   Glucose, Bld 105 (*)    Creatinine, Ser  1.79 (*)    Total Bilirubin 1.3 (*)    GFR, Estimated 31 (*)    All other components within normal limits  URINALYSIS, ROUTINE W REFLEX MICROSCOPIC - Abnormal; Notable for the following components:   APPearance HAZY (*)    All other components within normal limits  LIPASE, BLOOD  CBC    EKG: None  Radiology: CT ABDOMEN PELVIS WO CONTRAST Result Date: 06/15/2024 CLINICAL DATA:  Acute abdominal pain EXAM: CT ABDOMEN AND PELVIS WITHOUT CONTRAST TECHNIQUE:  Multidetector CT imaging of the abdomen and pelvis was performed following the standard protocol without IV contrast. RADIATION DOSE REDUCTION: This exam was performed according to the departmental dose-optimization program which includes automated exposure control, adjustment of the mA and/or kV according to patient size and/or use of iterative reconstruction technique. COMPARISON:  CT abdomen and pelvis 12/01/2023. FINDINGS: Lower chest: There is linear atelectasis or scarring in the right lung base. Hepatobiliary: 13 mm cyst appears unchanged in the left lobe of the liver. Otherwise, the liver, gallbladder and bile ducts are within normal limits. Pancreas: Unremarkable. No pancreatic ductal dilatation or surrounding inflammatory changes. Spleen: Normal in size without focal abnormality. Adrenals/Urinary Tract: Adrenal glands are unremarkable. Kidneys are normal, without renal calculi, focal lesion, or hydronephrosis. Bladder is unremarkable. Stomach/Bowel: Stomach is within normal limits. Appendix appears normal. No evidence of bowel wall thickening, distention, or inflammatory changes. Vascular/Lymphatic: Aortic atherosclerosis. No enlarged abdominal or pelvic lymph nodes. Reproductive: Uterus and bilateral adnexa are unremarkable. Other: No abdominal wall hernia or abnormality. No abdominopelvic ascites. Musculoskeletal: There are degenerative changes of the lower lumbar spine. IMPRESSION: 1. No acute localizing process in the abdomen or pelvis.  Aortic Atherosclerosis (ICD10-I70.0). Electronically Signed   By: Greig Pique M.D.   On: 06/15/2024 17:53   DG Chest 2 View Result Date: 06/15/2024 CLINICAL DATA:  Right upper quadrant pain EXAM: CHEST - 2 VIEW COMPARISON:  Chest x-ray 03/05/2024.  CT chest 03/24/2024. FINDINGS: There is linear atelectasis or scarring in the right lung base and right mid lung. There is no new lung consolidation, pleural effusion or pneumothorax. Emphysema again noted. Cardiomediastinal silhouette is within normal limits. No acute fractures are seen. IMPRESSION: Stable linear atelectasis or scarring in the right lung. No acute abnormality. Electronically Signed   By: Greig Pique M.D.   On: 06/15/2024 17:14     Procedures   Medications Ordered in the ED  morphine  (PF) 4 MG/ML injection 4 mg (4 mg Intravenous Given 06/15/24 1724)  ondansetron  (ZOFRAN ) injection 4 mg (4 mg Intravenous Given 06/15/24 1726)    Clinical Course as of 06/15/24 1813  Mon Jun 15, 2024  1717 Urinalysis, Routine w reflex microscopic -Urine, Clean Catch(!) Urinalysis normal.  CBC shows creatinine increased at 1.79 [JK]  1717 Lipase, blood Lipase normal.  CBC normal [JK]    Clinical Course User Index [JK] Randol Simmonds, MD                                 Medical Decision Making Problems Addressed: Right upper quadrant abdominal pain: acute illness or injury that poses a threat to life or bodily functions  Amount and/or Complexity of Data Reviewed Labs: ordered. Decision-making details documented in ED Course. Radiology: ordered and independent interpretation performed.  Risk Prescription drug management.   Patient presented to the ED for evaluation of pain in her upper abdomen.  Patient states she has been having symptoms ongoing for a while.  I have not been able to determine a cause.  Patient states she feels it in her right upper abdomen.  Patient was focally tender primarily along the rib margin.  No peritoneal signs on  exam.  ED workup reassuring.  No signs of hepatitis or pancreatitis.  Urinalysis does not suggest infection or hematuria to suggest stone.  CT scan does not show any acute abnormalities.  Nothing acute on chest x-ray.  Patient has had previous ultrasounds CT scans without acute  abnormality.  At this time low suspicion for acute infection cholecystitis pancreatitis obstruction or other serious abnormality.  Will have patient follow-up with her primary doctor to be rechecked     Final diagnoses:  Right upper quadrant abdominal pain    ED Discharge Orders     None          Randol Simmonds, MD 06/15/24 1815

## 2024-06-15 NOTE — Discharge Instructions (Signed)
 Laboratory test and CT scan did not show any acute abnormality.  It did not show any signs of tumor mass or infection.  Follow-up with your doctor to be rechecked.

## 2024-06-15 NOTE — ED Triage Notes (Signed)
 Pt reports she has a knot in the right side of her abdomen that she noticed a long time ago but now it is worse. She also reports nausea and vomiting as well. LBM yesterday and was normal.

## 2024-06-16 ENCOUNTER — Encounter (HOSPITAL_COMMUNITY)
Admission: RE | Admit: 2024-06-16 | Discharge: 2024-06-16 | Disposition: A | Discharge status: Home / Self Care | Source: Ambulatory Visit | Attending: Pulmonary Disease | Admitting: Pulmonary Disease

## 2024-06-16 VITALS — Wt 150.8 lb

## 2024-06-16 DIAGNOSIS — J432 Centrilobular emphysema: Secondary | ICD-10-CM | POA: Diagnosis not present

## 2024-06-16 NOTE — Progress Notes (Signed)
 Daily Session Note  Patient Details  Name: Mariah Foster MRN: [id number] Date of Birth: [birth year] Referring Provider:   Conrad Ports Pulmonary Rehab Walk Test from 04/03/2024 in [facility]  Referring Provider [name]    Encounter Date: 06/16/2024  Check In:  Session Check In - 06/16/24 1434       Check-In   Supervising physician immediately available to respond to emergencies [facility] MD immediately available    Physician(s) Lum Louis, NP    Location [facility]    Staff Present Johnnie Moats, MS, ACSM-CEP, Exercise Physiologist;Casey Claudene Candia Levin, RN, Valere Music, RN, BSN    Virtual Visit No    Medication changes reported     No    Fall or balance concerns reported    No    Tobacco Cessation No Change    Warm-up and Cool-down Performed as group-led instruction   orientation only   Resistance Training Performed Yes    VAD Patient? No    PAD/SET Patient? No      Pain Assessment   Currently in Pain? No/denies    Pain Score 0-No pain          Capillary Blood Glucose: Results for orders placed or performed during the hospital encounter of 06/15/24 (from the past 24 hours)  Lipase, blood     Status: None   Collection Time: 06/15/24  3:47 PM  Result Value Ref Range   Lipase 23 11 - 51 U/L  Comprehensive metabolic panel     Status: Abnormal   Collection Time: 06/15/24  3:47 PM  Result Value Ref Range   Sodium 140 135 - 145 mmol/L   Potassium 3.8 3.5 - 5.1 mmol/L   Chloride 102 98 - 111 mmol/L   CO2 26 22 - 32 mmol/L   Glucose, Bld 105 (H) 70 - 99 mg/dL   BUN 21 8 - 23 mg/dL   Creatinine, Ser 8.20 (H) 0.44 - 1.00 mg/dL   Calcium  9.0 8.9 - 10.3 mg/dL   Total Protein 7.4 6.5 - 8.1 g/dL   Albumin 4.3 3.5 - 5.0 g/dL   AST 24 15 - 41 U/L   ALT 20 0 - 44 U/L   Alkaline Phosphatase 75 38 - 126 U/L   Total Bilirubin 1.3 (H) 0.0 - 1.2 mg/dL   GFR, Estimated 31 (L) >60 mL/min    Anion gap 12 5 - 15  CBC     Status: None   Collection Time: 06/15/24  3:47 PM  Result Value Ref Range   WBC 7.0 4.0 - 10.5 K/uL   RBC 4.36 3.87 - 5.11 MIL/uL   Hemoglobin 13.3 12.0 - 15.0 g/dL   HCT 58.3 63.9 - 53.9 %   MCV 95.4 80.0 - 100.0 fL   MCH 30.5 26.0 - 34.0 pg   MCHC 32.0 30.0 - 36.0 g/dL   RDW 86.2 88.4 - 84.4 %   Platelets 362 150 - 400 K/uL   nRBC 0.0 0.0 - 0.2 %  Urinalysis, Routine w reflex microscopic -Urine, Clean Catch     Status: Abnormal   Collection Time: 06/15/24  3:47 PM  Result Value Ref Range   Color, Urine YELLOW YELLOW   APPearance HAZY (A) CLEAR   Specific Gravity, Urine 1.021 1.005 - 1.030   pH 5.0 5.0 - 8.0   Glucose, UA NEGATIVE NEGATIVE mg/dL   Hgb urine dipstick NEGATIVE NEGATIVE   Bilirubin Urine NEGATIVE NEGATIVE  Ketones, ur NEGATIVE NEGATIVE mg/dL   Protein, ur NEGATIVE NEGATIVE mg/dL   Nitrite NEGATIVE NEGATIVE   Leukocytes,Ua NEGATIVE NEGATIVE     Exercise Prescription Changes - 06/16/24 1500       Response to Exercise   Blood Pressure (Admit) 106/52    Blood Pressure (Exercise) 120/56    Blood Pressure (Exit) 120/60    Heart Rate (Admit) 73 bpm    Heart Rate (Exercise) 83 bpm    Heart Rate (Exit) 68 bpm    Oxygen Saturation (Admit) 95 %    Oxygen Saturation (Exercise) 90 %    Oxygen Saturation (Exit) 95 %    Rating of Perceived Exertion (Exercise) 13    Perceived Dyspnea (Exercise) 2    Duration Continue with 30 min of aerobic exercise without signs/symptoms of physical distress.    Intensity THRR unchanged      Progression   Progression Continue to progress workloads to maintain intensity without signs/symptoms of physical distress.      Resistance Training   Training Prescription Yes    Weight blue bands    Reps 10-15    Time 10 Minutes      NuStep   Level 4    SPM 72    Minutes 15    METs 2.4      Track   Laps 26    Minutes 15    METs 4.25          Social History   Tobacco Use  Smoking Status  Former   Current packs/day: 0.00   Average packs/day: 1 pack/day for 40.0 years (40.0 ttl pk-yrs)   Types: Cigarettes   Start date: 64   Quit date: 2017   Years since quitting: 8.6  Smokeless Tobacco Never    Goals Met:  Exercise tolerated well No report of concerns or symptoms today Strength training completed today  Goals Unmet:  Not Applicable  Comments: Service time is from 1322 to 1441    Dr. Slater Staff is Medical Director for Pulmonary Rehab at [facility].

## 2024-06-17 ENCOUNTER — Other Ambulatory Visit: Payer: Self-pay | Admitting: Internal Medicine

## 2024-06-17 ENCOUNTER — Other Ambulatory Visit (HOSPITAL_COMMUNITY): Payer: Self-pay | Admitting: Emergency Medicine

## 2024-06-17 DIAGNOSIS — N6324 Unspecified lump in the left breast, lower inner quadrant: Secondary | ICD-10-CM

## 2024-06-17 DIAGNOSIS — N611 Abscess of the breast and nipple: Secondary | ICD-10-CM

## 2024-06-18 ENCOUNTER — Encounter (HOSPITAL_COMMUNITY)
Admission: RE | Admit: 2024-06-18 | Discharge: 2024-06-18 | Disposition: A | Discharge status: Home / Self Care | Source: Ambulatory Visit | Attending: Pulmonary Disease

## 2024-06-18 DIAGNOSIS — J432 Centrilobular emphysema: Secondary | ICD-10-CM

## 2024-06-18 NOTE — Progress Notes (Signed)
 Daily Session Note  Patient Details  Name: Mariah Foster MRN: [id number] Date of Birth: 04/15/1961 Referring Provider:   Conrad Ports Pulmonary Rehab Walk Test from 04/03/2024 in [facility] for Heart, Vascular, & Lung Health  Referring Provider [name]    Encounter Date: 06/18/2024  Check In:  Session Check In - 06/18/24 1400       Check-In   Supervising physician immediately available to respond to emergencies [facility] MD immediately available    Physician(s) Rosaline Skains, NP    [facility]    Staff Present Johnnie Moats, MS, ACSM-CEP, Exercise Physiologist;Casey Claudene Candia Levin, RN, BSN;Jetta Walker BS, ACSM-CEP, Exercise Physiologist;Randi Midge BS, ACSM-CEP, Exercise Physiologist    Virtual Visit No    Medication changes reported     No    Fall or balance concerns reported    No    Tobacco Cessation No Change    Warm-up and Cool-down Performed as group-led instruction   orientation only   Resistance Training Performed Yes    VAD Patient? No    PAD/SET Patient? No      Pain Assessment   Currently in Pain? No/denies    Multiple Pain Sites No          Capillary Blood Glucose: No results found for this or any previous visit (from the past 24 hours).    Social History   Tobacco Use  Smoking Status Former   Current packs/day: 0.00   Average packs/day: 1 pack/day for 40.0 years (40.0 ttl pk-yrs)   Types: Cigarettes   Start date: 16   Quit date: 2017   Years since quitting: 8.6  Smokeless Tobacco Never    Goals Met:  Exercise tolerated well No report of concerns or symptoms today Strength training completed today  Goals Unmet:  Not Applicable  Comments: Service time is from 1310 to 1455    Dr. Slater Staff is Medical Director for Pulmonary Rehab at [facility].

## 2024-06-22 ENCOUNTER — Ambulatory Visit: Admitting: Physical Medicine and Rehabilitation

## 2024-06-23 ENCOUNTER — Encounter (HOSPITAL_COMMUNITY)
Admission: RE | Admit: 2024-06-23 | Discharge: 2024-06-23 | Disposition: A | Discharge status: Home / Self Care | Source: Ambulatory Visit | Attending: Pulmonary Disease

## 2024-06-23 DIAGNOSIS — J432 Centrilobular emphysema: Secondary | ICD-10-CM | POA: Diagnosis not present

## 2024-06-23 NOTE — Progress Notes (Signed)
 Home Exercise Prescription I have reviewed a Home Exercise Prescription with Pauletta A Biddle. She is currently walking 1 hour occasionally and doing some stretches. Discussed walking 3-5 days a week for 30 min or more. She is agreeable. She has progressed well in the program. The patient stated that their goals were to keep exercising. We reviewed exercise guidelines, target heart rate during exercise, RPE Scale, weather conditions, endpoints for exercise, warmup and cool down. The patient is encouraged to come to me with any questions. I will continue to follow up with the patient to assist them with progression and safety.  Spent 15 min discussing home exercise plan and goals.  Jacquelynne Guedes [place], [place], ACSM-CEP 06/23/2024 3:07 PM

## 2024-06-23 NOTE — Progress Notes (Signed)
 Daily Session Note  Patient Details  Name: Mariah Foster MRN: [id number] Date of Birth: 25-Sep-1961 Referring Provider:   Conrad Ports Pulmonary Rehab Walk Test from 04/03/2024 in [facility] for [facility], Vascular, & Lung [facility]  Referring Provider [name]    Encounter Date: 06/23/2024  Check In:  Session Check In - 06/23/24 1329       Check-In   Supervising physician immediately available to respond to emergencies [facility] MD immediately available    Physician(s) Rosaline Skains, NP    [facility]    Staff Present Johnnie Moats, MS, ACSM-CEP, Exercise Physiologist;Casey Claudene Candia Levin, RN, BSN;Randi Reeve BS, ACSM-CEP, Exercise Physiologist    Virtual Visit No    Medication changes reported     No    Fall or balance concerns reported    No    Tobacco Cessation No Change    Warm-up and Cool-down Performed as group-led instruction   orientation only   Resistance Training Performed Yes    VAD Patient? No    PAD/SET Patient? No      Pain Assessment   Currently in Pain? No/denies    Multiple Pain Sites No          Capillary Blood Glucose: No results found for this or any previous visit (from the past 24 hours).    Social History   Tobacco Use  Smoking Status Former   Current packs/day: 0.00   Average packs/day: 1 pack/day for 40.0 years (40.0 ttl pk-yrs)   Types: Cigarettes   Start date: 43   Quit date: 2017   Years since quitting: 8.6  Smokeless Tobacco Never    Goals Met:  Exercise tolerated well Queuing for purse lip breathing No report of concerns or symptoms today Strength training completed today  Goals Unmet:  Not Applicable  Comments: Service time is from 1312 to 1436    Dr. Slater Staff is Medical Director for Pulmonary Rehab at [facility]

## 2024-06-25 ENCOUNTER — Encounter (HOSPITAL_COMMUNITY)
Admission: RE | Admit: 2024-06-25 | Discharge: 2024-06-25 | Disposition: A | Discharge status: Home / Self Care | Source: Ambulatory Visit | Attending: Pulmonary Disease

## 2024-06-25 DIAGNOSIS — J432 Centrilobular emphysema: Secondary | ICD-10-CM | POA: Diagnosis not present

## 2024-06-30 ENCOUNTER — Encounter (HOSPITAL_COMMUNITY)
Admission: RE | Admit: 2024-06-30 | Discharge: 2024-06-30 | Disposition: A | Discharge status: Home / Self Care | Source: Ambulatory Visit | Attending: Pulmonary Disease | Admitting: Pulmonary Disease

## 2024-06-30 VITALS — Wt 147.0 lb

## 2024-06-30 DIAGNOSIS — J432 Centrilobular emphysema: Secondary | ICD-10-CM | POA: Diagnosis present

## 2024-06-30 NOTE — Progress Notes (Signed)
 Daily Session Note  Patient Details  Name: Mariah Foster MRN: [id number] Date of Birth: 21-Mar-1961 Referring Provider:   Conrad Ports Pulmonary Rehab Walk Test from 04/03/2024 in [facility] for Heart, Vascular, & Lung Health  Referring Provider [name]    Encounter Date: 06/30/2024  Check In:  Session Check In - 06/30/24 1404       Check-In   Supervising physician immediately available to respond to emergencies [facility] MD immediately available    Physician(s) Josefa Beauvais, NP    [facility]    Staff Present Johnnie Moats, MS, ACSM-CEP, Exercise Physiologist;Anjelika Ausburn Claudene Candia Levin, RN, BSN;Randi Midge BS, ACSM-CEP, Exercise Physiologist;Carlette Bernett, RN, BSN    Virtual Visit No    Medication changes reported     No    Fall or balance concerns reported    No    Tobacco Cessation No Change    Warm-up and Cool-down Performed as group-led instruction   orientation only   Resistance Training Performed Yes    VAD Patient? No    PAD/SET Patient? No      Pain Assessment   Currently in Pain? No/denies    Pain Score 0-No pain    Multiple Pain Sites No          Capillary Blood Glucose: No results found for this or any previous visit (from the past 24 hours).   Exercise Prescription Changes - 06/30/24 1500       Response to Exercise   Blood Pressure (Admit) 150/76    Blood Pressure (Exercise) 152/74    Blood Pressure (Exit) 128/76    Heart Rate (Admit) 68 bpm    Heart Rate (Exercise) 97 bpm    Heart Rate (Exit) 89 bpm    Oxygen Saturation (Admit) 96 %    Oxygen Saturation (Exercise) 95 %    Oxygen Saturation (Exit) 97 %    Rating of Perceived Exertion (Exercise) 11    Perceived Dyspnea (Exercise) 2    Duration Continue with 30 min of aerobic exercise without signs/symptoms of physical distress.    Intensity THRR unchanged      Progression   Progression Continue to progress workloads to maintain intensity without  signs/symptoms of physical distress.      Resistance Training   Training Prescription Yes    Weight blue bands    Reps 10-15    Time 10 Minutes      NuStep   Level 4    SPM 75    Minutes 15    METs 2.6      Track   Laps 20    METs 3.09          Social History   Tobacco Use  Smoking Status Former   Current packs/day: 0.00   Average packs/day: 1 pack/day for 40.0 years (40.0 ttl pk-yrs)   Types: Cigarettes   Start date: 89   Quit date: 2017   Years since quitting: 8.6  Smokeless Tobacco Never    Goals Met:  Proper associated with RPD/PD & O2 Sat Independence with exercise equipment Exercise tolerated well No report of concerns or symptoms today Strength training completed today  Goals Unmet:  Not Applicable  Comments: Service time is from 1313 to 1445.    Dr. Slater Staff is Medical Director for Pulmonary Rehab at [facility].

## 2024-07-08 ENCOUNTER — Ambulatory Visit: Admitting: Physical Medicine and Rehabilitation

## 2024-07-09 ENCOUNTER — Emergency Department (HOSPITAL_COMMUNITY)
Admission: EM | Admit: 2024-07-09 | Discharge: 2024-07-09 | Discharge status: Against Medical Advice | Attending: Emergency Medicine | Admitting: Emergency Medicine

## 2024-07-09 ENCOUNTER — Other Ambulatory Visit: Payer: Self-pay

## 2024-07-09 DIAGNOSIS — R059 Cough, unspecified: Secondary | ICD-10-CM | POA: Diagnosis not present

## 2024-07-09 DIAGNOSIS — Z5321 Procedure and treatment not carried out due to patient leaving prior to being seen by health care provider: Secondary | ICD-10-CM | POA: Diagnosis not present

## 2024-07-09 DIAGNOSIS — M542 Cervicalgia: Secondary | ICD-10-CM | POA: Insufficient documentation

## 2024-07-09 DIAGNOSIS — M25512 Pain in left shoulder: Secondary | ICD-10-CM | POA: Diagnosis not present

## 2024-07-09 MED ORDER — OXYCODONE-ACETAMINOPHEN 5-325 MG PO TABS
1.0000 | ORAL_TABLET | ORAL | Status: DC | PRN
  Administered 2024-07-09: 1 via ORAL
  Filled 2024-07-09: qty 1

## 2024-07-09 NOTE — Progress Notes (Signed)
 Discharge Progress Report  Patient Details  Name: Mariah Foster MRN: [id number] Date of Birth: May 09, 1961 Referring Provider:   [facility] Walk Test from 04/03/2024 in [facility]  Referring Provider [name]     Number of Visits: 22  Reason for Discharge:  Patient has met program and personal goals.  Smoking History:  Social History   Tobacco Use  Smoking Status Former   Current packs/day: 0.00   Average packs/day: 1 pack/day for 40.0 years (40.0 ttl pk-yrs)   Types: Cigarettes   Start date: 54   Quit date: 2017   Years since quitting: 8.6  Smokeless Tobacco Never    Diagnosis:  Centrilobular emphysema (HCC)  ADL UCSD:  Pulmonary Assessment Scores     Row Name 04/03/24 1346 06/18/24 1532 06/30/24 1516     ADL UCSD   ADL Phase Entry Exit Exit   SOB Score total 20 34 --     CAT Score   CAT Score 19 35 --     mMRC Score   mMRC Score 2 -- 2      Initial Exercise Prescription:  Initial Exercise Prescription - 04/03/24 1400       Date of Initial Exercise RX and Referring Provider   Date 04/03/24    Referring Provider [name]    Expected Discharge Date 06/30/24      NuStep   Level 2    SPM 70    Minutes 15    METs 2.3      Track   Laps 11    Minutes 15    METs 2.2      Prescription Details   Frequency (times per week) 2    Duration Progress to 30 minutes of continuous aerobic without signs/symptoms of physical distress      Intensity   THRR 40-80% of Max Heartrate 62-124    Ratings of Perceived Exertion 11-13    Perceived Dyspnea 0-4      Progression   Progression Continue to progress workloads to maintain intensity without signs/symptoms of physical distress.      Resistance Training   Training Prescription Yes    Weight blue bands    Reps 10-15          Discharge Exercise Prescription (Final Exercise Prescription Changes):  Exercise Prescription Changes -  06/30/24 1500       Response to Exercise   Blood Pressure (Admit) 150/76    Blood Pressure (Exercise) 152/74    Blood Pressure (Exit) 128/76    Heart Rate (Admit) 68 bpm    Heart Rate (Exercise) 97 bpm    Heart Rate (Exit) 89 bpm    Oxygen Saturation (Admit) 96 %    Oxygen Saturation (Exercise) 95 %    Oxygen Saturation (Exit) 97 %    Rating of Perceived Exertion (Exercise) 11    Perceived Dyspnea (Exercise) 2    Duration Continue with 30 min of aerobic exercise without signs/symptoms of physical distress.    Intensity THRR unchanged      Progression   Progression Continue to progress workloads to maintain intensity without signs/symptoms of physical distress.      Resistance Training   Training Prescription Yes    Weight blue bands    Reps 10-15    Time 10 Minutes      NuStep   Level 4    SPM 75    Minutes 15    METs  2.6      Track   Laps 20    METs 3.09          Functional Capacity:  6 Minute Walk     Row Name 04/03/24 1445 06/30/24 1503       6 Minute Walk   Phase Initial Discharge    Distance 1200 feet 1800 feet    Distance % Change -- 50 %    Distance Feet Change -- 600 ft    Walk Time 6 minutes 6 minutes    # of Rest Breaks 1  3:11-3:39 rest due to SOB and brief 87 RA 0    MPH 2.27 3.41    METS 3.64 4.72    RPE 13 13    Perceived Dyspnea  1 3    VO2 Peak 12.74 16.51    Symptoms Yes (comment) No    Comments Short of breath --    Resting HR 55 bpm 68 bpm    Resting BP 120/64 150/76    Resting Oxygen Saturation  98 % 96 %    Exercise Oxygen Saturation  during 6 min walk 89 % 91 %    Max Ex. HR 122 bpm 130 bpm    Max Ex. BP 178/82 164/90    2 Minute Post BP 162/78 170/80      Interval HR   1 Minute HR 64 65    2 Minute HR 62  inaccurate 64    3 Minute HR 121 117    4 Minute HR 68 117    5 Minute HR 70 130    6 Minute HR 122 126    2 Minute Post HR 73 73    Interval Heart Rate? Yes --      Interval Oxygen   Interval Oxygen? Yes --     Baseline Oxygen Saturation % 98 % 96 %    1 Minute Oxygen Saturation % 96 % 94 %    1 Minute Liters of Oxygen 0 L 0 L    2 Minute Oxygen Saturation % 93 % 93 %    2 Minute Liters of Oxygen 0 L 0 L    3 Minute Oxygen Saturation % 89 % 92 %    3 Minute Liters of Oxygen 0 L 0 L    4 Minute Oxygen Saturation % 96 % 91 %    4 Minute Liters of Oxygen 0 L 0 L    5 Minute Oxygen Saturation % 94 % 91 %    5 Minute Liters of Oxygen 0 L 0 L    6 Minute Oxygen Saturation % 99 % 91 %    6 Minute Liters of Oxygen 0 L 0 L    2 Minute Post Oxygen Saturation % 98 % 96 %    2 Minute Post Liters of Oxygen 0 L 0 L       Psychological, QOL, Others - Outcomes: PHQ 2/9:    06/18/2024    3:32 PM 04/03/2024    1:15 PM  Depression screen PHQ 2/9  Decreased Interest 0 2  Down, Depressed, Hopeless 1 0  PHQ - 2 Score 1 2  Altered sleeping 0 3  Tired, decreased energy 0 1  Change in appetite 0 0  Feeling bad or failure about yourself  0 0  Trouble concentrating 0 0  Moving slowly or fidgety/restless 0 0  Suicidal thoughts 0 0  PHQ-9 Score 1 6  Difficult doing work/chores Not difficult  at all Not difficult at all    Quality of Life:   Personal Goals: Goals established at orientation with interventions provided to work toward goal.  Personal Goals and Risk Factors at Admission - 04/03/24 1343       Core Components/Risk Factors/Patient Goals on Admission   Improve shortness of breath with ADL's Yes    Intervention Provide education, individualized exercise plan and daily activity instruction to help decrease symptoms of SOB with activities of daily living.    Expected Outcomes Short Term: Improve cardiorespiratory fitness to achieve a reduction of symptoms when performing ADLs;Long Term: Be able to perform more ADLs without symptoms or delay the onset of symptoms           Personal Goals Discharge:  Goals and Risk Factor Review     Row Name 04/06/24 0910 05/04/24 1230 05/29/24 1511 07/09/24  0936       Core Components/Risk Factors/Patient Goals Review   Personal Goals Review Improve shortness of breath with ADL's;Develop more efficient breathing techniques such as purse lipped breathing and diaphragmatic breathing and practicing self-pacing with activity. Improve shortness of breath with ADL's;Develop more efficient breathing techniques such as purse lipped breathing and diaphragmatic breathing and practicing self-pacing with activity. Improve shortness of breath with ADL's Improve shortness of breath with ADL's    Review Unable to assess goals yet. Juri has not started the program. She is scheduled to start on 6/12 Monthly review of patient's Core Components/Risk Factors/Patient Goals are as follows: Goal in progress for improving her shortness of breath with ADLs. Corinn has been able to maintain her oxygen saturation on room air with exertion. Goal met on developing more efficient breathing techniques such as purse lipped breathing and diaphragmatic breathing; and practicing self-pacing with activity. She can initiate pursed lip breathing and is practicing diaphragmatic breathing before bed. She can self-pace herself when needed and allows herself to take breaks while walking or exercising. Dashawn will continue to benefit from PR for nutrition, education, exercise, and lifestyle modification. Monthly review of patient's Core Components/Risk Factors/Patient Goals are as follows: Goal in progress for improving her shortness of breath with ADLs. Vernona has been able to maintain her oxygen saturation on room air with exertion. She is increasing her METs and workload on the NuStep and walking the track. Lenka will continue to benefit from PR for nutrition, education, exercise, and lifestyle modification. Dreya graduated from the [employer] on 06/30/24. She met her goal for improving shortness of breath with ADL's. Keni did great during the program and we wish her the best.    Expected Outcomes For  Debbie to improve shortness of breath with ADL's and develop more efficient breathing techniques such as purse lipped breathing and diaphragmatic breathing; and practicing self-pacing with activity and improve shortness of breath with ADL's Pt will show progress toward meeting expected goals and outcomes. Pt will show progress toward meeting expected goals and outcomes. To continue to exercise and modify her nutrition and lifestyle post graduation       Exercise Goals and Review:  Exercise Goals     Row Name 04/03/24 1348             Exercise Goals   Increase Physical Activity Yes       Intervention Provide advice, education, support and counseling about physical activity/exercise needs.;Develop an individualized exercise prescription for aerobic and resistive training based on initial evaluation findings, risk stratification, comorbidities and participant's personal goals.  Expected Outcomes Short Term: Attend rehab on a regular basis to increase amount of physical activity.;Long Term: Add in home exercise to make exercise part of routine and to increase amount of physical activity.;Long Term: Exercising regularly at least 3-5 days a week.       Increase Strength and Stamina Yes       Intervention Provide advice, education, support and counseling about physical activity/exercise needs.;Develop an individualized exercise prescription for aerobic and resistive training based on initial evaluation findings, risk stratification, comorbidities and participant's personal goals.       Expected Outcomes Short Term: Increase workloads from initial exercise prescription for resistance, speed, and METs.;Short Term: Perform resistance training exercises routinely during rehab and add in resistance training at home;Long Term: Improve cardiorespiratory fitness, muscular endurance and strength as measured by increased METs and functional capacity ( )       Able to understand and use rate of perceived  exertion (RPE) scale Yes       Intervention Provide education and explanation on how to use RPE scale       Expected Outcomes Short Term: Able to use RPE daily in rehab to express subjective intensity level;Long Term:  Able to use RPE to guide intensity level when exercising independently       Able to understand and use Dyspnea scale Yes       Intervention Provide education and explanation on how to use Dyspnea scale       Expected Outcomes Short Term: Able to use Dyspnea scale daily in rehab to express subjective sense of shortness of breath during exertion;Long Term: Able to use Dyspnea scale to guide intensity level when exercising independently       Knowledge and understanding of Target Heart Rate Range (THRR) Yes       Intervention Provide education and explanation of THRR including how the numbers were predicted and where they are located for reference       Expected Outcomes Short Term: Able to state/look up THRR;Long Term: Able to use THRR to govern intensity when exercising independently;Short Term: Able to use daily as guideline for intensity in rehab       Understanding of Exercise Prescription Yes       Intervention Provide education, explanation, and written materials on patient's individual exercise prescription       Expected Outcomes Short Term: Able to explain program exercise prescription;Long Term: Able to explain home exercise prescription to exercise independently          Exercise Goals Re-Evaluation:  Exercise Goals Re-Evaluation     Row Name 04/28/24 0851 05/28/24 0833 06/30/24 1512         Exercise Goal Re-Evaluation   Exercise Goals Review Increase Physical Activity;Able to understand and use Dyspnea scale;Understanding of Exercise Prescription;Increase Strength and Stamina;Knowledge and understanding of Target Heart Rate Range (THRR);Able to understand and use rate of perceived exertion (RPE) scale Increase Physical Activity;Able to understand and use Dyspnea  scale;Understanding of Exercise Prescription;Increase Strength and Stamina;Knowledge and understanding of Target Heart Rate Range (THRR);Able to understand and use rate of perceived exertion (RPE) scale Increase Physical Activity;Able to understand and use Dyspnea scale;Understanding of Exercise Prescription;Increase Strength and Stamina;Knowledge and understanding of Target Heart Rate Range (THRR);Able to understand and use rate of perceived exertion (RPE) scale     Comments Pt has completed 4 exercise sessions, missing one for transportation. She is motivated. She is exercising on the recumbent stepper for 15 min, level 3, METs 2.4. She then  is walking the track for 15 min, 3.62 METs. She can walk quickly on the track but has attempted the treadmill twice without success. She is eager to change equipment but will discuss importance of building endurance first. She performs warm up and cool down with demonstrative cues, including squats. She is using blue bands, 5.8 lbs. Pt has completed 13 exercise sessions. She loves the program. She is exercising on the recumbent stepper for 15 min, level 4, METs 2.6 when she works on speed. She then is walking the track for 15 min, 3.4 METs last session. She performs warm up and cool down with demonstrative cues, including squats. She is using blue bands, 5.8 lbs. Tolerating well. Pt completed 22 exercise sessions. She increased her 6 min walk test by 50%, 600 ft. Her max METs were 4.7. She really enjoyed the program and sts she plans to walk at home. Her SOB and CAT scales increased but might be subjective.     Expected Outcomes Through exercise at rehab and at home, patient will increase physical capacity and ADL's will be easier to perform. The patient will also feel comfortable establishing a home exercise program. Through exercise at rehab and at home, patient will increase physical capacity and ADL's will be easier to perform. The patient will also feel comfortable  establishing a home exercise program. Through exercise at rehab and at home, patient will increase physical capacity and ADL's will be easier to perform. The patient will also feel comfortable establishing a home exercise program.        Nutrition & Weight - Outcomes:  Pre Biometrics - 04/03/24 1454       Pre Biometrics   Grip Strength 38 kg   old dynometer          Nutrition:  Nutrition Therapy & Goals - 06/16/24 1428       Nutrition Therapy   Diet Heart Healthy Diet    Drug/Food Interactions Statins/Certain Fruits      Personal Nutrition Goals   Nutrition Goal Patient to improve diet quality by using the plate method as a guide for meal planning to include lean protein/plant protein, fruits, vegetables, whole grains, nonfat dairy as part of a well-balanced diet.    Comments Goals in progress. Patient has medical history of HTN, hyperlipidemia, CAD, COPD. Lipids have improved WNL treated with crestor  40mg . A1c has improved from 6.5 to 6.0 and is managed with diet. She reports following  a regular diet and is minful of sodium intake and motivated to maintain weight. She was seen for follow-up for elevated blood pressures and in Irbesartan  was increased to 300mg  daily. She is up 2.8# since starting with our program. Patient will continue to benefit from participation in pulmonary rehab for nutrition ,exercise and lifestyle modification.      Intervention Plan   Intervention Prescribe, educate and counsel regarding individualized specific dietary modifications aiming towards targeted core components such as weight, hypertension, lipid management, diabetes, heart failure and other comorbidities.;Nutrition handout(s) given to patient.    Expected Outcomes Short Term Goal: Understand basic principles of dietary content, such as calories, fat, sodium, cholesterol and nutrients.;Long Term Goal: Adherence to prescribed nutrition plan.          Nutrition Discharge:   Education  Questionnaire Score:  Knowledge Questionnaire Score - 06/18/24 1532       Knowledge Questionnaire Score   Post Score 13/18          Goals reviewed with patient; copy given to  patient.

## 2024-07-09 NOTE — ED Triage Notes (Signed)
 PT reports left shoulder/neck pain and swelling that started about 1 week ago. Pt also reports cough.

## 2024-07-10 ENCOUNTER — Emergency Department (HOSPITAL_COMMUNITY)

## 2024-07-10 ENCOUNTER — Emergency Department (HOSPITAL_COMMUNITY)
Admission: EM | Admit: 2024-07-10 | Discharge: 2024-07-11 | Disposition: A | Discharge status: Home / Self Care | Attending: Emergency Medicine | Admitting: Emergency Medicine

## 2024-07-10 ENCOUNTER — Other Ambulatory Visit: Payer: Self-pay

## 2024-07-10 DIAGNOSIS — M62838 Other muscle spasm: Secondary | ICD-10-CM | POA: Insufficient documentation

## 2024-07-10 DIAGNOSIS — S161XXA Strain of muscle, fascia and tendon at neck level, initial encounter: Secondary | ICD-10-CM | POA: Diagnosis not present

## 2024-07-10 DIAGNOSIS — S199XXA Unspecified injury of neck, initial encounter: Secondary | ICD-10-CM | POA: Diagnosis present

## 2024-07-10 DIAGNOSIS — X509XXA Other and unspecified overexertion or strenuous movements or postures, initial encounter: Secondary | ICD-10-CM | POA: Insufficient documentation

## 2024-07-10 LAB — I-STAT CHEM 8, ED
BUN: 33 mg/dL — ABNORMAL HIGH (ref 8–23)
Calcium, Ion: 1.18 mmol/L (ref 1.15–1.40)
Chloride: 103 mmol/L (ref 98–111)
Creatinine, Ser: 1.3 mg/dL — ABNORMAL HIGH (ref 0.44–1.00)
Glucose, Bld: 100 mg/dL — ABNORMAL HIGH (ref 70–99)
HCT: 41 % (ref 36.0–46.0)
Hemoglobin: 13.9 g/dL (ref 12.0–15.0)
Potassium: 3.9 mmol/L (ref 3.5–5.1)
Sodium: 141 mmol/L (ref 135–145)
TCO2: 27 mmol/L (ref 22–32)

## 2024-07-10 LAB — CBC
HCT: 41.3 % (ref 36.0–46.0)
Hemoglobin: 13.2 g/dL (ref 12.0–15.0)
MCH: 30 pg (ref 26.0–34.0)
MCHC: 32 g/dL (ref 30.0–36.0)
MCV: 93.9 fL (ref 80.0–100.0)
Platelets: 318 K/uL (ref 150–400)
RBC: 4.4 MIL/uL (ref 3.87–5.11)
RDW: 13.2 % (ref 11.5–15.5)
WBC: 5.3 K/uL (ref 4.0–10.5)
nRBC: 0 % (ref 0.0–0.2)

## 2024-07-10 LAB — BASIC METABOLIC PANEL WITH GFR
Anion gap: 11 (ref 5–15)
BUN: 26 mg/dL — ABNORMAL HIGH (ref 8–23)
CO2: 24 mmol/L (ref 22–32)
Calcium: 9.3 mg/dL (ref 8.9–10.3)
Chloride: 103 mmol/L (ref 98–111)
Creatinine, Ser: 1.19 mg/dL — ABNORMAL HIGH (ref 0.44–1.00)
GFR, Estimated: 51 mL/min — ABNORMAL LOW (ref >60)
Glucose, Bld: 99 mg/dL (ref 70–99)
Potassium: 3.6 mmol/L (ref 3.5–5.1)
Sodium: 138 mmol/L (ref 135–145)

## 2024-07-10 LAB — TROPONIN I (HIGH SENSITIVITY)
Troponin I (High Sensitivity): 3 ng/L (ref <18)
Troponin I (High Sensitivity): 3 ng/L (ref <18)

## 2024-07-10 MED ORDER — CYCLOBENZAPRINE HCL 10 MG PO TABS
10.0000 mg | ORAL_TABLET | Freq: Once | ORAL | Status: AC
  Administered 2024-07-10: 10 mg via ORAL
  Filled 2024-07-10: qty 1

## 2024-07-10 MED ORDER — OXYCODONE-ACETAMINOPHEN 5-325 MG PO TABS
1.0000 | ORAL_TABLET | Freq: Once | ORAL | Status: AC
  Administered 2024-07-10: 1 via ORAL
  Filled 2024-07-10: qty 1

## 2024-07-10 MED ORDER — LIDOCAINE 5 % EX PTCH
1.0000 | MEDICATED_PATCH | CUTANEOUS | Status: DC
  Administered 2024-07-10: 1 via TRANSDERMAL
  Filled 2024-07-10: qty 1

## 2024-07-10 NOTE — ED Provider Triage Note (Signed)
 Emergency Medicine Provider Triage Evaluation Note  Mariah Foster , a 63 y.o. female  was evaluated in triage.  Pt complains of left sided neck pain x1 week. She reports that she is having left sided pain as well.  Worse with movement.  Denies any worsening of her chronic shortness of breath.  Denies any injury.  Reports that she woke up with this pain.  Denies any weakness, numbness, tingling in her upper extremities.  Review of Systems  Positive:  Negative:   Physical Exam  BP (!) 160/87 (BP Location: Left Arm)   Pulse 71   Temp 97.8 F (36.6 C)   Resp 18   Ht 5' 4 (1.626 m)   Wt 63.4 kg   SpO2 93%   BMI 23.99 kg/m  Gen:   Awake, no distress   Resp:  Normal effort  MSK:   Moves extremities without difficulty  Other:  Is to the left trapezius into the left-sided neck, shoulder, and into the back.  Patient also has left chest wall tenderness palpation.  Symmetric radial pulses.  Grip strength equal.  Strength intact and symmetric in the bilateral upper extremities.  Medical Decision Making  Medically screening exam initiated at 2:36 PM.  Appropriate orders placed.  Mariah Foster was informed that the remainder of the evaluation will be completed by another provider, this initial triage assessment does not replace that evaluation, and the importance of remaining in the ED until their evaluation is complete.  However I do feel that this is likely musculoskeletal, patient does have multiple risk factors.  Will order cardiac workup.   Bernis Ernst, [place] 07/10/24 1437

## 2024-07-10 NOTE — ED Triage Notes (Signed)
 Pt here from home returning to the Ed with c/o left shoulder and neck pain ,was her yesterday but left due to wait times

## 2024-07-11 MED ORDER — OXYCODONE-ACETAMINOPHEN 5-325 MG PO TABS: 1.0000 | ORAL_TABLET | Freq: Four times a day (QID) | ORAL | 0 refills | Status: DC | PRN

## 2024-07-11 MED ORDER — CYCLOBENZAPRINE HCL 10 MG PO TABS
10.0000 mg | ORAL_TABLET | Freq: Three times a day (TID) | ORAL | 0 refills | Status: DC | PRN
Start: 2024-07-11 — End: 2024-09-25

## 2024-07-11 MED ORDER — LIDOCAINE 5 % EX PTCH: 1.0000 | MEDICATED_PATCH | CUTANEOUS | 0 refills | Status: AC

## 2024-07-11 NOTE — ED Provider Notes (Signed)
 [facility] DEPT [facility] Emergency Department Provider Note MRN:  [id number]  Arrival date & time: 07/11/24     Chief Complaint   Neck Injury   History of Present Illness   Calleen Alvis Codrington is a 63 y.o. year-old female presents to the ED with chief complaint of left shoulder and neck pain.  She states that the symptoms have been ongoing for a couple of weeks.  She states that 1 day she woke up with the pain and tightness in her neck.  She states that she has chronic cough from COPD, which worsens her symptoms.  She states that she has never been able to get her muscles to completely relax and still feels very tight and sore.  She denies fevers or chills.  Denies known injury or trauma, other than sleeping on it.  History provided by patient.   Review of Systems  Pertinent positive and negative review of systems noted in HPI.    Physical Exam   Vitals:   07/11/24 0017 07/11/24 0107  BP: (!) 172/92 (!) 162/89  Pulse: (!) 56 (!) 55  Resp: 18 18  Temp: 97.9 F (36.6 C) 98 F (36.7 C)  SpO2: 97% 98%    CONSTITUTIONAL:  non toxic-appearing, NAD NEURO:  Alert and oriented x 3, CN 3-12 grossly intact EYES:  eyes equal and reactive ENT/NECK:  Supple, no stridor  CARDIO:  normal rate, regular rhythm, appears well-perfused  PULM:  No respiratory distress, CTAB GI/GU:  non-distended,  MSK/SPINE:  No gross deformities, no edema, moves all extremities, left upper back and traps are very tight with trigger points present, no rash or redness SKIN:  no rash, atraumatic   *Additional and/or pertinent findings included in MDM below  Diagnostic and Interventional Summary    EKG Interpretation Date/Time:    Ventricular Rate:    PR Interval:    QRS Duration:    QT Interval:    QTC Calculation:   R Axis:      Text Interpretation:         Labs Reviewed  BASIC METABOLIC PANEL WITH GFR - Abnormal; Notable for the following components:      Result Value   BUN 26 (*)     Creatinine, Ser 1.19 (*)    GFR, Estimated 51 (*)    All other components within normal limits  I-STAT CHEM 8, ED - Abnormal; Notable for the following components:   BUN 33 (*)    Creatinine, Ser 1.30 (*)    Glucose, Bld 100 (*)    All other components within normal limits  CBC  TROPONIN I (HIGH SENSITIVITY)  TROPONIN I (HIGH SENSITIVITY)    DG Chest 2 View  Final Result      Medications  oxyCODONE -acetaminophen  (PERCOCET/ROXICET) 5-325 MG per tablet 1 tablet (1 tablet Oral Given 07/10/24 2329)  cyclobenzaprine  (FLEXERIL ) tablet 10 mg (10 mg Oral Given 07/10/24 2338)     Procedures  /  Critical Care Procedures  ED Course and Medical Decision Making  I have reviewed the triage vital signs, the nursing notes, and pertinent available records from the EMR.  Social Determinants Affecting Complexity of Care: Patient has no clinically significant social determinants affecting this chief complaint..   ED Course:    Medical Decision Making Patient here with significant soreness to the left upper back and neck.  Her muscles are very tight and tender.  Physical exam is consistent with muscle tension and spasm.  She had reassuring workup done in  triage, EKG is without acute ischemic changes, troponins are negative x 2, chest x-ray shows emphysema, but no pneumonia.  Labs are otherwise unremarkable.  I have low suspicion for ACS, CHF, PE, or pneumonia.  Her symptoms all seem to be musculoskeletal as the pain is easily reproduced with palpation.  I have treated the patient with Percocet and Lidoderm  and Flexeril .  She states that she feels significant relief.  Will treat her with the same for the next few days at home.  She is agreeable with this plan.  Risk Prescription drug management.         Consultants: No consultations were needed in caring for this patient.   Treatment and Plan: I considered admission due to patient's initial presentation, but after considering the  examination and diagnostic results, patient will not require admission and can be discharged with outpatient follow-up.    Final Clinical Impressions(s) / ED Diagnoses     ICD-10-CM   1. Trapezius muscle spasm  M62.838     2. Strain of neck muscle, initial encounter  S16.1XXA       ED Discharge Orders          Ordered    oxyCODONE -acetaminophen  (PERCOCET) 5-325 MG tablet  Every 6 hours PRN        07/11/24 0046    cyclobenzaprine  (FLEXERIL ) 10 MG tablet  3 times daily PRN        07/11/24 0046    lidocaine  (LIDODERM ) 5 %  Every 24 hours        07/11/24 0046              Discharge Instructions Discussed with and Provided to Patient:   Discharge Instructions   None      Vicky Charleston, PA-C 07/11/24 9390    Bari Charmaine FALCON, MD 07/11/24 2329

## 2024-07-28 ENCOUNTER — Other Ambulatory Visit: Payer: Self-pay

## 2024-07-28 ENCOUNTER — Emergency Department (HOSPITAL_COMMUNITY)
Admission: EM | Admit: 2024-07-28 | Discharge: 2024-07-28 | Discharge status: Against Medical Advice | Attending: Emergency Medicine | Admitting: Emergency Medicine

## 2024-07-28 DIAGNOSIS — Z5321 Procedure and treatment not carried out due to patient leaving prior to being seen by health care provider: Secondary | ICD-10-CM | POA: Diagnosis not present

## 2024-07-28 DIAGNOSIS — R1011 Right upper quadrant pain: Secondary | ICD-10-CM | POA: Diagnosis present

## 2024-07-28 LAB — COMPREHENSIVE METABOLIC PANEL WITH GFR
ALT: 15 U/L (ref 0–44)
AST: 18 U/L (ref 15–41)
Albumin: 4.7 g/dL (ref 3.5–5.0)
Alkaline Phosphatase: 93 U/L (ref 38–126)
Anion gap: 13 (ref 5–15)
BUN: 23 mg/dL (ref 8–23)
CO2: 24 mmol/L (ref 22–32)
Calcium: 9.5 mg/dL (ref 8.9–10.3)
Chloride: 101 mmol/L (ref 98–111)
Creatinine, Ser: 1.07 mg/dL — ABNORMAL HIGH (ref 0.44–1.00)
GFR, Estimated: 57 mL/min — ABNORMAL LOW (ref >60)
Glucose, Bld: 125 mg/dL — ABNORMAL HIGH (ref 70–99)
Potassium: 3.7 mmol/L (ref 3.5–5.1)
Sodium: 139 mmol/L (ref 135–145)
Total Bilirubin: 0.7 mg/dL (ref 0.0–1.2)
Total Protein: 7.3 g/dL (ref 6.5–8.1)

## 2024-07-28 LAB — CBC
HCT: 41.3 % (ref 36.0–46.0)
Hemoglobin: 13 g/dL (ref 12.0–15.0)
MCH: 29.7 pg (ref 26.0–34.0)
MCHC: 31.5 g/dL (ref 30.0–36.0)
MCV: 94.3 fL (ref 80.0–100.0)
Platelets: 292 K/uL (ref 150–400)
RBC: 4.38 MIL/uL (ref 3.87–5.11)
RDW: 13.2 % (ref 11.5–15.5)
WBC: 5.8 K/uL (ref 4.0–10.5)
nRBC: 0 % (ref 0.0–0.2)

## 2024-07-28 LAB — LIPASE, BLOOD: Lipase: 18 U/L (ref 11–51)

## 2024-07-28 NOTE — ED Triage Notes (Signed)
 Patient to ED by EMS from home with c/o ABD pain. She states she has had ABD pain for years but today it worsened. She describes pain as a hard ball in her upper right quadrant that radiates into her L chest area only when she inhales.

## 2024-07-28 NOTE — ED Notes (Signed)
 Pt left the lobby and stated She waited too long and she will come tomorrow.

## 2024-08-25 ENCOUNTER — Other Ambulatory Visit: Payer: Self-pay

## 2024-08-25 DIAGNOSIS — I872 Venous insufficiency (chronic) (peripheral): Secondary | ICD-10-CM

## 2024-08-28 ENCOUNTER — Other Ambulatory Visit: Payer: Self-pay

## 2024-08-28 ENCOUNTER — Emergency Department (HOSPITAL_COMMUNITY): Admission: EM | Admit: 2024-08-28 | Discharge: 2024-08-28 | Disposition: A | Discharge status: Home / Self Care

## 2024-08-28 ENCOUNTER — Emergency Department (HOSPITAL_COMMUNITY)

## 2024-08-28 ENCOUNTER — Encounter (HOSPITAL_COMMUNITY): Payer: Self-pay | Admitting: Emergency Medicine

## 2024-08-28 DIAGNOSIS — N179 Acute kidney failure, unspecified: Secondary | ICD-10-CM | POA: Diagnosis not present

## 2024-08-28 DIAGNOSIS — R1084 Generalized abdominal pain: Secondary | ICD-10-CM | POA: Diagnosis not present

## 2024-08-28 DIAGNOSIS — N644 Mastodynia: Secondary | ICD-10-CM | POA: Insufficient documentation

## 2024-08-28 DIAGNOSIS — J449 Chronic obstructive pulmonary disease, unspecified: Secondary | ICD-10-CM | POA: Insufficient documentation

## 2024-08-28 DIAGNOSIS — R109 Unspecified abdominal pain: Secondary | ICD-10-CM | POA: Diagnosis present

## 2024-08-28 DIAGNOSIS — Z7951 Long term (current) use of inhaled steroids: Secondary | ICD-10-CM | POA: Insufficient documentation

## 2024-08-28 LAB — COMPREHENSIVE METABOLIC PANEL WITH GFR
ALT: 18 U/L (ref 0–44)
AST: 17 U/L (ref 15–41)
Albumin: 4.5 g/dL (ref 3.5–5.0)
Alkaline Phosphatase: 81 U/L (ref 38–126)
Anion gap: 14 (ref 5–15)
BUN: 25 mg/dL — ABNORMAL HIGH (ref 8–23)
CO2: 25 mmol/L (ref 22–32)
Calcium: 9.5 mg/dL (ref 8.9–10.3)
Chloride: 94 mmol/L — ABNORMAL LOW (ref 98–111)
Creatinine, Ser: 2.06 mg/dL — ABNORMAL HIGH (ref 0.44–1.00)
GFR, Estimated: 26 mL/min — ABNORMAL LOW (ref >60)
Glucose, Bld: 116 mg/dL — ABNORMAL HIGH (ref 70–99)
Potassium: 4.3 mmol/L (ref 3.5–5.1)
Sodium: 133 mmol/L — ABNORMAL LOW (ref 135–145)
Total Bilirubin: 1.1 mg/dL (ref 0.0–1.2)
Total Protein: 7.7 g/dL (ref 6.5–8.1)

## 2024-08-28 LAB — CBC
HCT: 40.7 % (ref 36.0–46.0)
Hemoglobin: 13.4 g/dL (ref 12.0–15.0)
MCH: 30.9 pg (ref 26.0–34.0)
MCHC: 32.9 g/dL (ref 30.0–36.0)
MCV: 94 fL (ref 80.0–100.0)
Platelets: 346 K/uL (ref 150–400)
RBC: 4.33 MIL/uL (ref 3.87–5.11)
RDW: 13.5 % (ref 11.5–15.5)
WBC: 6.4 K/uL (ref 4.0–10.5)
nRBC: 0 % (ref 0.0–0.2)

## 2024-08-28 LAB — LIPASE, BLOOD: Lipase: 23 U/L (ref 11–51)

## 2024-08-28 LAB — TROPONIN I (HIGH SENSITIVITY)
Troponin I (High Sensitivity): 3 ng/L (ref <18)
Troponin I (High Sensitivity): 3 ng/L (ref <18)

## 2024-08-28 MED ORDER — SODIUM CHLORIDE 0.9 % IV BOLUS
1000.0000 mL | Freq: Once | INTRAVENOUS | Status: AC
  Administered 2024-08-28: 1000 mL via INTRAVENOUS

## 2024-08-28 MED ORDER — ACETAMINOPHEN 500 MG PO TABS
1000.0000 mg | ORAL_TABLET | Freq: Once | ORAL | Status: AC
Start: 2024-08-28 — End: 2024-08-28
  Administered 2024-08-28: 1000 mg via ORAL
  Filled 2024-08-28: qty 2

## 2024-08-28 NOTE — Discharge Instructions (Addendum)
 2. Pulmonary emphysema is present. Pulmonary emphysema is an  independent risk factor for lung cancer. Recommend patient be  considered for lung cancer screening. [place]  Occupational Hygienist. Screening for Lung Cancer: [place]  Therapist, Occupational. JAMA. 2021;325(10):[phone].   Please follow-up with your PCP so that we we can monitor to make sure you do not develop any kind of lung cancer.  There is no clear source of your abdominal pain.  Please follow-up with gastroenterology.  If anything changes please come at the ED for further evaluation.  Your creatinine was elevated today.  Please have repeat labs in the next week to make sure the creatinine improves.  Please drink plenty of fluid.  If your creatinine does not improve, you will need to come back to the ED.

## 2024-08-28 NOTE — ED Provider Notes (Signed)
 [place] EMERGENCY DEPARTMENT AT [facility] Provider Note   CSN: [id number] Arrival date & time: 08/28/24  1325     Patient presents with: Abdominal Pain   Mariah Foster is a 63 y.o. female.    Abdominal Pain     Patient presents because of abdominal pain.  Patient endorsing right side abdominal pain as well as right-sided flank pain.  Patient states this been present for months.  No nausea or vomiting.  Patient states that she will also have some cramping in her left breast area.  This is also been present for months.  No pleuritic chest pain no hemoptysis.  No history of DVT or PE.  No recent travel history.  No unilateral leg pain or swelling.  Denies any obvious exertional chest pain.  Patient states this has been more of a chronic issues.  No nausea vomiting diarrhea that she is aware of.  No fever no chills.  No sick contacts.  Producing urine.  No dysuria.   Previous medical history reviewed : Patient last seen in the ED in September 2025.  Was seen because a neck injury at that time.  Trapezius muscle spasm at times.  Patient was last admitted in June 2024 in the setting of COPD exacerbation.   Prior to Admission medications   Medication Sig Start Date End Date Taking? Authorizing Provider  albuterol  (PROVENTIL ) (2.5 MG/3ML) 0.083% nebulizer solution Take 3 mLs (2.5 mg total) by nebulization 3 (three) times daily as needed for wheezing or shortness of breath. 08/29/23   Jeneal Danita Macintosh, MD  albuterol  (VENTOLIN  HFA) 108 (90 Base) MCG/ACT inhaler Inhale 2 puffs into the lungs every 6 (six) hours as needed for wheezing or shortness of breath. 02/04/24   Kara Dorn NOVAK, MD  amLODipine  (NORVASC ) 5 MG tablet Take 1 tablet (5 mg total) by mouth daily. 01/22/24   Raford Riggs, MD  azelastine  (ASTELIN ) 0.1 % nasal spray Place 2 sprays into both nostrils 2 (two) times daily. Use in each nostril as directed    [provider]  Blood Pressure  Monitoring (BLOOD PRESSURE CUFF) MISC Check blood pressure daily for elevated blood pressure 01/22/24   Raford Riggs, MD  budeson-glycopyrrolate-formoterol (BREZTRI  AEROSPHERE) 160-9-4.8 MCG/ACT AERO inhaler Inhale 2 puffs into the lungs 2 (two) times daily. 02/04/24   Kara Dorn NOVAK, MD  cetirizine (ZYRTEC) 10 MG tablet Take 10 mg by mouth daily. 11/09/23   [provider]  cyclobenzaprine  (FLEXERIL ) 10 MG tablet Take 1 tablet (10 mg total) by mouth 3 (three) times daily as needed for muscle spasms. 07/11/24   Vicky Charleston, PA-C  dicyclomine  (BENTYL ) 20 MG tablet Take 1 tablet (20 mg total) by mouth 2 (two) times daily. 12/01/23   Henderly, Britni A, PA-C  FEROSUL 325 (65 Fe) MG tablet Take 325 mg by mouth daily. 11/09/23   [provider]  hydrOXYzine  (ATARAX ) 25 MG tablet Take 25 mg by mouth every 6 (six) hours as needed (sleep). 11/26/23   [provider]  irbesartan  (AVAPRO ) 150 MG tablet Take 1 tablet (150 mg total) by mouth daily. 01/22/24   Raford Riggs, MD  levocetirizine (XYZAL ) 5 MG tablet Take 1 tablet (5 mg total) by mouth every evening. 12/05/23   Jeneal Danita Macintosh, MD  levothyroxine  (SYNTHROID ) 25 MCG tablet Take 25 mcg by mouth daily before breakfast. 10/07/23   [provider]  lidocaine  (LIDODERM ) 5 % Place 1 patch onto the skin daily. Remove & Discard patch within 12  hours or as directed by MD 07/11/24   Vicky Charleston, PA-C  montelukast  (SINGULAIR ) 10 MG tablet Take 1 tablet (10 mg total) by mouth at bedtime. 12/05/23   Jeneal Danita Macintosh, MD  ondansetron  (ZOFRAN -ODT) 4 MG disintegrating tablet Take 1 tablet (4 mg total) by mouth every 8 (eight) hours as needed for nausea or vomiting. 11/23/23   Keith Sor, PA-C  oxyCODONE -acetaminophen  (PERCOCET) 5-325 MG tablet Take 1 tablet by mouth every 6 (six) hours as needed. 07/11/24   Vicky Charleston, PA-C  pantoprazole  (PROTONIX ) 40 MG tablet Take 40 mg by mouth daily. 12/06/23 12/05/24   [provider]  rosuvastatin  (CRESTOR ) 40 MG tablet Take 1 tablet (40 mg total) by mouth daily. 01/22/24 05/25/24  Raford Riggs, MD  spironolactone  (ALDACTONE ) 25 MG tablet Take 1 tablet (25 mg total) by mouth daily. 01/22/24 05/25/24  Raford Riggs, MD  triamcinolone cream (KENALOG) 0.1 % Apply 1 Application topically as needed.    [provider]  valACYclovir  (VALTREX ) 1000 MG tablet Take 1,000 mg by mouth daily. For 5 days    [provider]  potassium chloride  (K-DUR) 10 MEQ tablet Take 1 tablet (10 mEq total) by mouth daily. 03/14/17 05/26/19  Layden, Lindsey A, PA-C    Allergies: Flagyl [metronidazole] and Metronidazole hcl    Review of Systems  Gastrointestinal:  Positive for abdominal pain.    Updated Vital Signs BP 139/74 (BP Location: Left Arm)   Pulse 86   Temp 97.8 F (36.6 C) (Oral)   Resp 18   SpO2 97%   Physical Exam Vitals and nursing note reviewed.  Constitutional:      General: She is not in acute distress.    Appearance: She is well-developed.  HENT:     Head: Normocephalic and atraumatic.  Eyes:     Conjunctiva/sclera: Conjunctivae normal.  Cardiovascular:     Rate and Rhythm: Normal rate and regular rhythm.     Heart sounds: No murmur heard. Pulmonary:     Effort: Pulmonary effort is normal. No respiratory distress.     Breath sounds: Normal breath sounds.  Abdominal:     Palpations: Abdomen is soft.     Tenderness: There is no abdominal tenderness.  Musculoskeletal:        General: No swelling.     Cervical back: Neck supple.  Skin:    General: Skin is warm and dry.     Capillary Refill: Capillary refill takes less than 2 seconds.  Neurological:     Mental Status: She is alert.  Psychiatric:        Mood and Affect: Mood normal.     (all labs ordered are listed, but only abnormal results are displayed) Labs Reviewed  COMPREHENSIVE METABOLIC PANEL WITH GFR - Abnormal; Notable for the following components:       Result Value   Sodium 133 (*)    Chloride 94 (*)    Glucose, Bld 116 (*)    BUN 25 (*)    Creatinine, Ser 2.06 (*)    GFR, Estimated 26 (*)    All other components within normal limits  LIPASE, BLOOD  CBC  URINALYSIS, ROUTINE W REFLEX MICROSCOPIC  TROPONIN I (HIGH SENSITIVITY)  TROPONIN I (HIGH SENSITIVITY)    EKG: None  Radiology: CT CHEST ABDOMEN PELVIS WO CONTRAST Result Date: 08/28/2024 CLINICAL DATA:  Right upper quadrant and right flank pain EXAM: CT CHEST, ABDOMEN AND PELVIS WITHOUT CONTRAST TECHNIQUE: Multidetector CT imaging of the chest, abdomen and pelvis was  performed following the standard protocol without IV contrast. RADIATION DOSE REDUCTION: This exam was performed according to the departmental dose-optimization program which includes automated exposure control, adjustment of the mA and/or kV according to patient size and/or use of iterative reconstruction technique. COMPARISON:  07/10/2024, 06/15/2024 FINDINGS: CT CHEST FINDINGS Cardiovascular: Unenhanced imaging of the heart is unremarkable without pericardial effusion. Normal caliber of the thoracic aorta. Atherosclerosis of the aorta and coronary vasculature. Assessment of the vascular lumen cannot be performed without intravenous contrast. Mediastinum/Nodes: No enlarged mediastinal, hilar, or axillary lymph nodes. Thyroid  gland, trachea, and esophagus demonstrate no significant findings. Lungs/Pleura: Upper lobe predominant emphysema. No acute airspace disease, effusion, or pneumothorax. The central airways are patent. Musculoskeletal: No acute or destructive bony abnormalities. Reconstructed images demonstrate no additional findings. CT ABDOMEN PELVIS FINDINGS Hepatobiliary: Unremarkable unenhanced appearance of the liver and gallbladder. Stable cyst within the left lobe of the liver. No biliary duct dilation. Pancreas: Unremarkable unenhanced appearance. Spleen: Unremarkable unenhanced appearance. Adrenals/Urinary Tract:  No urinary tract calculi or obstructive uropathy within either kidney. The adrenals and bladder are unremarkable. Stomach/Bowel: No bowel obstruction or ileus. Normal appendix right lower quadrant. No bowel wall thickening or inflammatory change. Vascular/Lymphatic: Aortic atherosclerosis. No enlarged abdominal or pelvic lymph nodes. Reproductive: Uterus and bilateral adnexa are unremarkable. Other: No free fluid or free intraperitoneal gas. No abdominal wall hernia. Musculoskeletal: No acute or destructive bony abnormalities. Reconstructed images demonstrate no additional findings. IMPRESSION: 1. No acute intrathoracic, intra-abdominal, or intrapelvic process. 2. Pulmonary emphysema is present. Pulmonary emphysema is an independent risk factor for lung cancer. Recommend patient be considered for lung cancer screening. [place]  Chief Financial Officer. Screening for Lung Cancer: [place]  Licensed Conveyancer. JAMA. 2021;325(10):962-970. 3. Aortic Atherosclerosis (ICD10-I70.0). Coronary artery atherosclerosis. Electronically Signed   By: Ozell Daring M.D.   On: 08/28/2024 17:26     Procedures   Medications Ordered in the ED  sodium chloride  0.9 % bolus 1,000 mL (1,000 mLs Intravenous New Bag/Given 08/28/24 1731)  acetaminophen  (TYLENOL ) tablet 1,000 mg (1,000 mg Oral Given 08/28/24 1809)                                    Medical Decision Making Amount and/or Complexity of Data Reviewed Labs: ordered. Radiology: ordered.  Risk OTC drugs.     HPI:    Patient presents because of abdominal pain.  Patient endorsing right side abdominal pain as well as right-sided flank pain.  Patient states this been present for months.  No nausea or vomiting.  Patient states that she will also have some cramping in her left breast area.  This is also been present for months.  No pleuritic chest pain no hemoptysis.  No history of DVT or PE.  No recent travel history.  No  unilateral leg pain or swelling.  Denies any obvious exertional chest pain.  Patient states this has been more of a chronic issues.  No nausea vomiting diarrhea that she is aware of.  No fever no chills.  No sick contacts.  Producing urine.  No dysuria.   Previous medical history reviewed : Patient last seen in the ED in September 2025.  Was seen because a neck injury at that time.  Trapezius muscle spasm at times.  Patient was last admitted in June 2024 in the setting of COPD exacerbation.    MDM:    Upon exam, patient Impeklo stable.  ANO x 3 GCS 15.  No tachypnea or tachycardia.  No hypoxia.  Reproducible generalized abdominal pain.  Right flank pain as well.  Reproducible left breast pain.    Will obtain laboratory workup to assess for AKI or leukocytosis.  Will obtain CT scan of the abdomen to rule and, nephrolithiasis.  CT chest as well given the left breast pain.   Currently, no concerns for DVT or PE.  No pleuritic chest pain or hemoptysis.  No risk factors.  No further workup needed for this at this time.   Will obtain EKG as well as ACS workup.  Will obtain this in the setting of the mild epigastric pain.  Reevaluation:   Upon reexamination, patient hemodynamically stable.  Remains A&O x 3 with GCS 15.  Obtain cardiac workup in the setting of epigastric pain.  Troponin negative x 2.  No delta change.  No concerns for acute ACS process at this time. Laboratory workup showed AKI.  Creatinine of 2.  Baseline around 1.2-1.3.  Received a liter of fluid patient will have repeat laboratory workup in the next week to make sure this improves.  Strict return precautions for this.  She understands to follow-up with her PCP about this.  CT imaging unremarkable for acute pathology.  Commend follow-up with PCP about screening for pulmonary cancer.  Discharge in stable condition.    has been seen multiple times for these similar complaints.  No further intervention needed.    EKG  Interpreted by Me: nsr   Cardiac Tele Interpreted by Me: nsr   I have independently interpreted the CXR  and CT  images and agree with the radiologist finding   Social Determinant of Health: smoking history   Disposition and Follow Up: pcp      Final diagnoses:  Generalized abdominal pain  AKI (acute kidney injury)    ED Discharge Orders     None          Simon Lavonia SAILOR, MD 08/28/24 2253

## 2024-08-28 NOTE — ED Triage Notes (Signed)
 Pt also endorses some foot pain that she wanted me to draw attn too. States her feet cramp and make her toes spread apart.

## 2024-08-28 NOTE — ED Triage Notes (Signed)
 Pt here from home with c/o chronic right upper quadrant abd pain , hx of same

## 2024-09-21 ENCOUNTER — Ambulatory Visit (HOSPITAL_COMMUNITY)
Admission: RE | Admit: 2024-09-21 | Discharge: 2024-09-21 | Disposition: A | Discharge status: Home / Self Care | Source: Ambulatory Visit | Attending: Vascular Surgery | Admitting: Vascular Surgery

## 2024-09-21 DIAGNOSIS — I872 Venous insufficiency (chronic) (peripheral): Secondary | ICD-10-CM | POA: Insufficient documentation

## 2024-09-25 ENCOUNTER — Emergency Department (HOSPITAL_COMMUNITY): Admission: EM | Admit: 2024-09-25 | Discharge: 2024-09-25 | Disposition: A | Discharge status: Home / Self Care

## 2024-09-25 ENCOUNTER — Other Ambulatory Visit: Payer: Self-pay

## 2024-09-25 DIAGNOSIS — M7918 Myalgia, other site: Secondary | ICD-10-CM

## 2024-09-25 MED ORDER — CYCLOBENZAPRINE HCL 10 MG PO TABS: 10.0000 mg | ORAL_TABLET | Freq: Three times a day (TID) | ORAL | 0 refills | Status: AC | PRN

## 2024-09-25 MED ORDER — OXYCODONE-ACETAMINOPHEN 5-325 MG PO TABS: 1.0000 | ORAL_TABLET | Freq: Four times a day (QID) | ORAL | 0 refills | Status: AC | PRN

## 2024-09-25 NOTE — ED Triage Notes (Signed)
 Patient states she's had neck pain for the past month. She was here for it and was told it was tight muscles and she states that the muscles are tighter now.

## 2024-09-25 NOTE — ED Provider Notes (Signed)
 [place] EMERGENCY DEPARTMENT AT [facility] Provider Note   CSN: [id number] Arrival date & time: 09/25/24  1253     Patient presents with: Neck Pain   Mariah Foster is a 63 y.o. female.   Patient to ED with complaint of pain to bilateral upper shoulders that is a recurrent issue that, per patient, has been going on for a long time, sometimes up to twice weekly. She states she has never followed up with anyone outpatient. No radiation to the upper extremities. No chest pain, or SOB. No cough or fever. No headache.   The history is provided by the patient. No language interpreter was used.  Neck Pain      Prior to Admission medications   Medication Sig Start Date End Date Taking? Authorizing Provider  albuterol  (PROVENTIL ) (2.5 MG/3ML) 0.083% nebulizer solution Take 3 mLs (2.5 mg total) by nebulization 3 (three) times daily as needed for wheezing or shortness of breath. 08/29/23   Jeneal Danita Macintosh, MD  albuterol  (VENTOLIN  HFA) 108 [phone] Base) MCG/ACT inhaler Inhale 2 puffs into the lungs every 6 (six) hours as needed for wheezing or shortness of breath. 02/04/24   Kara Dorn NOVAK, MD  amLODipine  (NORVASC ) 5 MG tablet Take 1 tablet (5 mg total) by mouth daily. 01/22/24   Raford Riggs, MD  azelastine  (ASTELIN ) 0.1 % nasal spray Place 2 sprays into both nostrils 2 (two) times daily. Use in each nostril as directed    [provider]  Blood Pressure Monitoring (BLOOD PRESSURE CUFF) MISC Check blood pressure daily for elevated blood pressure 01/22/24   Raford Riggs, MD  budeson-glycopyrrolate-formoterol (BREZTRI  AEROSPHERE) 160-9-4.8 MCG/ACT AERO inhaler Inhale 2 puffs into the lungs 2 (two) times daily. 02/04/24   Kara Dorn NOVAK, MD  cetirizine (ZYRTEC) 10 MG tablet Take 10 mg by mouth daily. 11/09/23   [provider]  cyclobenzaprine  (FLEXERIL ) 10 MG tablet Take 1 tablet (10 mg total) by mouth 3 (three) times daily as needed for muscle  spasms. 09/25/24   Odell Balls, PA-C  dicyclomine  (BENTYL ) 20 MG tablet Take 1 tablet (20 mg total) by mouth 2 (two) times daily. 12/01/23   Henderly, Britni A, PA-C  FEROSUL 325 (65 Fe) MG tablet Take 325 mg by mouth daily. 11/09/23   [provider]  hydrOXYzine  (ATARAX ) 25 MG tablet Take 25 mg by mouth every 6 (six) hours as needed (sleep). 11/26/23   [provider]  irbesartan  (AVAPRO ) 150 MG tablet Take 1 tablet (150 mg total) by mouth daily. 01/22/24   Raford Riggs, MD  levocetirizine (XYZAL ) 5 MG tablet Take 1 tablet (5 mg total) by mouth every evening. 12/05/23   Jeneal Danita Macintosh, MD  levothyroxine  (SYNTHROID ) 25 MCG tablet Take 25 mcg by mouth daily before breakfast. 10/07/23   [provider]  lidocaine  (LIDODERM ) 5 % Place 1 patch onto the skin daily. Remove & Discard patch within 12 hours or as directed by MD 07/11/24   Vicky Charleston, PA-C  montelukast  (SINGULAIR ) 10 MG tablet Take 1 tablet (10 mg total) by mouth at bedtime. 12/05/23   Jeneal Danita Macintosh, MD  ondansetron  (ZOFRAN -ODT) 4 MG disintegrating tablet Take 1 tablet (4 mg total) by mouth every 8 (eight) hours as needed for nausea or vomiting. 11/23/23   Keith Sor, PA-C  oxyCODONE -acetaminophen  (PERCOCET) 5-325 MG tablet Take 1 tablet by mouth every 6 (six) hours as needed. 09/25/24   Odell Balls, PA-C  pantoprazole  (PROTONIX ) 40 MG tablet Take 40 mg  by mouth daily. 12/06/23 12/05/24  [provider]  rosuvastatin  (CRESTOR ) 40 MG tablet Take 1 tablet (40 mg total) by mouth daily. 01/22/24 05/25/24  Raford Riggs, MD  spironolactone  (ALDACTONE ) 25 MG tablet Take 1 tablet (25 mg total) by mouth daily. 01/22/24 05/25/24  Raford Riggs, MD  triamcinolone cream (KENALOG) 0.1 % Apply 1 Application topically as needed.    [provider]  valACYclovir  (VALTREX ) 1000 MG tablet Take 1,000 mg by mouth daily. For 5 days    [provider]  potassium chloride  (K-DUR)  10 MEQ tablet Take 1 tablet (10 mEq total) by mouth daily. 03/14/17 05/26/19  Layden, Lindsey A, PA-C    Allergies: Flagyl [metronidazole] and Metronidazole hcl    Review of Systems  Musculoskeletal:  Positive for neck pain.    Updated Vital Signs BP (!) 151/94 (BP Location: Right Arm)   Pulse 80   Temp 98.1 F (36.7 C)   Resp 18   Ht 5' 4 (1.626 m)   Wt 63.5 kg   SpO2 98%   BMI 24.03 kg/m   Physical Exam Vitals and nursing note reviewed.  Constitutional:      Appearance: Normal appearance.  HENT:     Head: Normocephalic.  Cardiovascular:     Rate and Rhythm: Normal rate and regular rhythm.     Heart sounds: No murmur heard. Pulmonary:     Effort: Pulmonary effort is normal.     Breath sounds: No wheezing, rhonchi or rales.  Musculoskeletal:       Arms:     Cervical back: Normal range of motion and neck supple.     Comments: Tenderness to soft musculature of bilateral trapezius. No palpable spasm. FROM neck. Tenderness does extend across the cervical area. No swelling. Grip strength of UE's equal and symmetric.   Neurological:     Mental Status: She is alert.     (all labs ordered are listed, but only abnormal results are displayed) Labs Reviewed - No data to display  EKG: None  Radiology: No results found.   Procedures   Medications Ordered in the ED - No data to display  Clinical Course as of 09/25/24 1721  Fri Sep 25, 2024  1712 Patient to ED with recurrent pain to bilateral upper shoulders and across cervical area. No neurologic deficits. Symptoms can occur twice weekly. She takes Tylenol  with relief.   Will treat pain. PDMD reviewed without aberrancies. Strongly encouraged outpatient evaluation and management for chronic/recurrent symptoms.  [SU]    Clinical Course User Index [SU] Odell Balls, PA-C                                 Medical Decision Making       Final diagnoses:  Musculoskeletal pain    ED Discharge Orders           Ordered    oxyCODONE -acetaminophen  (PERCOCET) 5-325 MG tablet  Every 6 hours PRN        09/25/24 1719    cyclobenzaprine  (FLEXERIL ) 10 MG tablet  3 times daily PRN        09/25/24 1720               Odell Balls, PA-C 09/25/24 1721    Mariah Lavonia SAILOR, MD 09/25/24 2222

## 2024-09-25 NOTE — Discharge Instructions (Addendum)
 As we discussed, follow up with orthopedics for further evaluation of neck and bilateral shoulder pain that is recurrent. Take medications as prescribed. Warm compresses to the sore areas.

## 2024-09-29 ENCOUNTER — Ambulatory Visit: Attending: Vascular Surgery | Admitting: Physician Assistant

## 2024-09-29 ENCOUNTER — Encounter (HOSPITAL_COMMUNITY)

## 2024-09-29 ENCOUNTER — Encounter: Payer: Self-pay | Admitting: Physician Assistant

## 2024-09-29 VITALS — BP 108/75 | HR 94 | Temp 97.8°F | Wt 150.2 lb

## 2024-09-29 DIAGNOSIS — I872 Venous insufficiency (chronic) (peripheral): Secondary | ICD-10-CM | POA: Diagnosis not present

## 2024-09-29 NOTE — Progress Notes (Signed)
 Requested by:  Duwaine Annabella SAILOR, FNP [address] [place],  [place] [address]  Reason for consultation: swelling of BLE, Right greater than left    History of Present Illness   Mariah Foster is a 63 y.o. (07-22-1961) female who presents for evaluation of bilateral leg swelling and right leg pain. She says this has been on going over past 5 years. She says most of her pain is in her right groin. She says it is severe at times and stops her in her tracks. This occurs while sitting or walking. Also will wake her up at night. She explains that it is not worsened by walking. Pain will shoot down back of her leg into her right foot. Also has some swelling and burning in her toes. This comes and goes. She does try to elevate in recliner. This sometimes helps. She has never worn compression stockings. She says for the pain she usually takes Tylenol  Arthritis. This will help for a little but pain will come right back. She does report back pain. She is taking muscle relaxer's for this which she just got from the ER and percocet. She is former smoker. Does not describe any rest pain or claudication symptoms. No tissue loss. She has no history of DVT. No family history.   Venous symptoms include: burning, swelling Onset/duration:  5 years Occupation:  Financial trader Aggravating factors: sitting, standing, laying Alleviating factors: none Compression:  no Helps:  n/a Pain medications:  Tylenol  arthritis, flexeril  Previous vein procedures:  none History of DVT:  no  Past Medical History:  Diagnosis Date   CAD in native artery 12/11/2023   COPD (chronic obstructive pulmonary disease) (HCC)    GI bleed    Hyperlipidemia    Hypertension    Tubular adenoma of colon 2013    Past Surgical History:  Procedure Laterality Date   CESAREAN SECTION  8014,8013   x 2    Social History   Socioeconomic History   Marital status: Single    Spouse name: Not on file   Number of children: 2    Years of education: Not on file   Highest education level: High school graduate  Occupational History   Occupation: cleaning service  Tobacco Use   Smoking status: Former    Current packs/day: 0.00    Average packs/day: 1 pack/day for 40.0 years (40.0 ttl pk-yrs)    Types: Cigarettes    Start date: 29    Quit date: 2017    Years since quitting: 8.9   Smokeless tobacco: Never  Vaping Use   Vaping status: Every Day  Substance and Sexual Activity   Alcohol use: Yes    Comment: socially   Drug use: No   Sexual activity: Never  Other Topics Concern   Not on file  Social History Narrative   Not on file   Social Drivers of Health   Financial Resource Strain: High Risk (01/22/2024)   Overall Financial Resource Strain (CARDIA)    Difficulty of Paying Living Expenses: Hard  Food Insecurity: Food Insecurity Present (01/22/2024)   Hunger Vital Sign    Worried About Running Out of Food in the Last Year: Sometimes true    Ran Out of Food in the Last Year: Sometimes true  Transportation Needs: No Transportation Needs (12/11/2023)   PRAPARE - Administrator, Civil Service (Medical): No    Lack of Transportation (Non-Medical): No  Physical Activity: Not on File (11/14/2022)  Received from [facility]   Physical Activity    Physical Activity: 0  Stress: Not on File (11/14/2022)   Received [facility]   Stress    Stress: 0  Social Connections: Not on File (07/06/2023)   Received from [facility]   Social Connections    Connectedness: 0  Intimate Partner Violence: Unknown (01/31/2022)   Received from [facility]   HITS    Physically Hurt: Not on file    Insult or Talk Down To: Not on file    Threaten Physical Harm: Not on file    Scream or Curse: Not on file    Family History  Problem Relation Age of Onset   Heart attack Father    Diabetes Sister        x 2   Diabetes Brother     Current Outpatient Medications  Medication Sig Dispense Refill   albuterol  (PROVENTIL ) (2.5  MG/3ML) 0.083% nebulizer solution Take 3 mLs (2.5 mg total) by nebulization 3 (three) times daily as needed for wheezing or shortness of breath. 75 mL 0   albuterol  (VENTOLIN  HFA) 108 (90 Base) MCG/ACT inhaler Inhale 2 puffs into the lungs every 6 (six) hours as needed for wheezing or shortness of breath. 8 g 11   amLODipine  (NORVASC ) 5 MG tablet Take 1 tablet (5 mg total) by mouth daily. 90 tablet 3   azelastine  (ASTELIN ) 0.1 % nasal spray Place 2 sprays into both nostrils 2 (two) times daily. Use in each nostril as directed     Blood Pressure Monitoring (BLOOD PRESSURE CUFF) MISC Check blood pressure daily for elevated blood pressure 1 each 0   budeson-glycopyrrolate-formoterol (BREZTRI  AEROSPHERE) 160-9-4.8 MCG/ACT AERO inhaler Inhale 2 puffs into the lungs 2 (two) times daily. 10.7 g 11   cetirizine (ZYRTEC) 10 MG tablet Take 10 mg by mouth daily.     cyclobenzaprine  (FLEXERIL ) 10 MG tablet Take 1 tablet (10 mg total) by mouth 3 (three) times daily as needed for muscle spasms. 10 tablet 0   dicyclomine  (BENTYL ) 20 MG tablet Take 1 tablet (20 mg total) by mouth 2 (two) times daily. 20 tablet 0   FEROSUL 325 (65 Fe) MG tablet Take 325 mg by mouth daily.     hydrOXYzine  (ATARAX ) 25 MG tablet Take 25 mg by mouth every 6 (six) hours as needed (sleep).     irbesartan  (AVAPRO ) 150 MG tablet Take 1 tablet (150 mg total) by mouth daily. 30 tablet 3   levocetirizine (XYZAL ) 5 MG tablet Take 1 tablet (5 mg total) by mouth every evening. 30 tablet 5   levothyroxine  (SYNTHROID ) 25 MCG tablet Take 25 mcg by mouth daily before breakfast.     lidocaine  (LIDODERM ) 5 % Place 1 patch onto the skin daily. Remove & Discard patch within 12 hours or as directed by MD 30 patch 0   montelukast  (SINGULAIR ) 10 MG tablet Take 1 tablet (10 mg total) by mouth at bedtime. 30 tablet 5   ondansetron  (ZOFRAN -ODT) 4 MG disintegrating tablet Take 1 tablet (4 mg total) by mouth every 8 (eight) hours as needed for nausea or vomiting.  10 tablet 0   oxyCODONE -acetaminophen  (PERCOCET) 5-325 MG tablet Take 1 tablet by mouth every 6 (six) hours as needed. 10 tablet 0   pantoprazole  (PROTONIX ) 40 MG tablet Take 40 mg by mouth daily.     rosuvastatin  (CRESTOR ) 40 MG tablet Take 1 tablet (40 mg total) by mouth daily. (Patient not taking: Reported on 09/29/2024) 90 tablet 3  spironolactone  (ALDACTONE ) 25 MG tablet Take 1 tablet (25 mg total) by mouth daily. 90 tablet 3   triamcinolone cream (KENALOG) 0.1 % Apply 1 Application topically as needed.     valACYclovir  (VALTREX ) 1000 MG tablet Take 1,000 mg by mouth daily. For 5 days     No current facility-administered medications for this visit.    Allergies  Allergen Reactions   Flagyl [Metronidazole]     unknown   Metronidazole Hcl Hives    Stomach cramps    REVIEW OF SYSTEMS (negative unless checked):   Cardiac:  []  Chest pain or chest pressure? []  Shortness of breath upon activity? []  Shortness of breath when lying flat? []  Irregular heart rhythm?  Vascular:  []  Pain in calf, thigh, or hip brought on by walking? []  Pain in feet at night that wakes you up from your sleep? []  Blood clot in your veins? [x]  Leg swelling?  Pulmonary:  []  Oxygen at home? []  Productive cough? []  Wheezing?  Neurologic:  []  Sudden weakness in arms or legs? []  Sudden numbness in arms or legs? []  Sudden onset of difficult speaking or slurred speech? []  Temporary loss of vision in one eye? []  Problems with dizziness?  Gastrointestinal:  []  Blood in stool? []  Vomited blood?  Genitourinary:  []  Burning when urinating? []  Blood in urine?  Psychiatric:  []  Major depression  Hematologic:  []  Bleeding problems? []  Problems with blood clotting?  Dermatologic:  []  Rashes or ulcers?  Constitutional:  []  Fever or chills?  Ear/Nose/Throat:  []  Change in hearing? []  Nose bleeds? []  Sore throat?  Musculoskeletal:  []  Back pain? []  Joint pain? []  Muscle pain?   Physical  Examination     Vitals:   09/29/24 1230  BP: 108/75  Pulse: 94  Temp: 97.8 F (36.6 C)  TempSrc: Temporal  Weight: 150 lb 3.2 oz (68.1 kg)   Body mass index is 25.78 kg/m.  General:  WDWN in NAD; vital signs documented above Gait: Normal HENT: WNL, normocephalic Pulmonary: normal non-labored breathing Cardiac: regular HR Abdomen: soft Extremities:2+ femoral pulses, 2+ DP pulses bilaterally. Feet warm and well perfused. Very tender to palpation of her femoral pulses. without varicose veins, without reticular veins, without edema, without stasis pigmentation, without lipodermatosclerosis, without ulcers Musculoskeletal: no muscle wasting or atrophy  Neurologic: A&O X 3;  No focal weakness or paresthesias are detected Psychiatric:  The pt has Normal affect.  Non-invasive Vascular Imaging   BLE Venous Insufficiency Duplex (09/21/24):  RLE:  No DVT and SVT No GSV reflux  GSV diameter 0.25 -0.43 cm No SSV reflux CFV deep venous reflux   Medical Decision Making   Teghan A Hataway is a 63 y.o. female who presents with: RLE chronic venous insufficiency with swelling,  and pain. Duplex shows no DVT or SVT. She has some deep reflux in the CFV otherwise her veins are competent throughout the right lower extremity. Based on this duplex she is not a candidate for a venous ablation. Her pain is not typical for venous disease or arterial disease. She does not describe classic claudication  or rest pain. She has no tissue loss. On exam I could not appreciate any swelling in her lower legs or feet. She was very tender when palpating her femoral pulses bilaterally. I suspect she her current symptoms are more musculoskeletal or neuropathic.  Based on the patient's history and examination, I recommend: daily elevation of 20-30 minutes above level of heart, daily compression stocking use, exercise, weight reduction, refraining  from prolonged sitting or standing Patient was provided with information  about vein health Recommend she follow back up with her PCP to discuss possible other factors that could be causing her current symptoms Will make referral at request of patient for Podiatrist so she can have someone trim her nails She can follow up as needed if she has new or concerning symptoms  Teretha Damme, PA-C [facility] Office: [phone]  09/29/2024, 12:58 PM  Clinic MD:  Magda

## 2024-09-29 NOTE — Patient Instructions (Signed)

## 2024-10-12 ENCOUNTER — Ambulatory Visit: Admitting: Podiatry

## 2024-10-12 ENCOUNTER — Encounter: Payer: Self-pay | Admitting: Podiatry

## 2024-10-12 DIAGNOSIS — M79672 Pain in left foot: Secondary | ICD-10-CM | POA: Diagnosis not present

## 2024-10-12 DIAGNOSIS — M79671 Pain in right foot: Secondary | ICD-10-CM | POA: Diagnosis not present

## 2024-10-12 DIAGNOSIS — M7671 Peroneal tendinitis, right leg: Secondary | ICD-10-CM | POA: Diagnosis not present

## 2024-10-12 DIAGNOSIS — B351 Tinea unguium: Secondary | ICD-10-CM | POA: Diagnosis not present

## 2024-10-12 NOTE — Progress Notes (Signed)
 Patient presents for evaluation and treatment of tenderness and some redness around nails feet.  Tenderness around toes with walking and wearing shoes.  Complains of some swelling and soreness at times along the lateral ankle on the right.  Physical exam:  General appearance: Alert, pleasant, and in no acute distress.  Vascular: Pedal pulses: DP 2/4 B/L, PT 2/4 B/L. Mild edema lower legs bilaterally.  Capillary refill time immediate bilaterally  Neurologic: Light touch intact bilaterally.  Normal Achilles tendon reflex bilaterally no spasticity or clonus noted.  Dermatologic:  Nails thickened, disfigured, discolored 1-5 BL with subungual debris.  Redness and hypertrophic nail folds along nail folds bilaterally but no signs of drainage or infection.  Skin thin atrophic with no hair growth lower extremity bilaterally  Musculoskeletal:  Some soreness along the peroneal tendons in the retromalleolar area right.  Some swelling along the tendon sheaths.   Diagnosis: 1. Painful onychomycotic nails 1 through 5 bilaterally. 2. Pain toes 1 through 5 bilaterally. 3.  Peroneal tendinitis right  Plan: -New patient office visit for evaluation and management level 3.  Modifier 25. - Discussed the peroneal tendinitis on the right.  Recommended icing, OTC NSAIDs, topical diclofenac gel.  Wear good stable supportive shoes.  If it gets worse, injections could be considered.  -Debrided onychomycotic nails 1 through 5 bilaterally.  Sharply debrided nails with nail clipper and reduced with a power bur.  Return 3 months RFC

## 2025-01-12 ENCOUNTER — Ambulatory Visit: Admitting: Podiatry
